# Patient Record
Sex: Female | Born: 1950 | Race: White | Hispanic: No | Smoking: Never smoker
Health system: Southern US, Community
[De-identification: ages and names within clinical notes are randomized; demographics above are authoritative.]

## PROBLEM LIST (undated history)

## (undated) DIAGNOSIS — I441 Atrioventricular block, second degree: Secondary | ICD-10-CM

## (undated) DIAGNOSIS — I2510 Atherosclerotic heart disease of native coronary artery without angina pectoris: Secondary | ICD-10-CM

## (undated) DIAGNOSIS — E039 Hypothyroidism, unspecified: Secondary | ICD-10-CM

## (undated) DIAGNOSIS — K219 Gastro-esophageal reflux disease without esophagitis: Secondary | ICD-10-CM

## (undated) DIAGNOSIS — E785 Hyperlipidemia, unspecified: Secondary | ICD-10-CM

## (undated) DIAGNOSIS — I5030 Unspecified diastolic (congestive) heart failure: Secondary | ICD-10-CM

## (undated) DIAGNOSIS — E669 Obesity, unspecified: Secondary | ICD-10-CM

## (undated) DIAGNOSIS — N183 Chronic kidney disease, stage 3 unspecified: Secondary | ICD-10-CM

## (undated) DIAGNOSIS — E559 Vitamin D deficiency, unspecified: Secondary | ICD-10-CM

## (undated) DIAGNOSIS — G8929 Other chronic pain: Secondary | ICD-10-CM

## (undated) DIAGNOSIS — J189 Pneumonia, unspecified organism: Secondary | ICD-10-CM

## (undated) DIAGNOSIS — J45909 Unspecified asthma, uncomplicated: Secondary | ICD-10-CM

## (undated) DIAGNOSIS — I639 Cerebral infarction, unspecified: Secondary | ICD-10-CM

## (undated) DIAGNOSIS — G43909 Migraine, unspecified, not intractable, without status migrainosus: Secondary | ICD-10-CM

## (undated) DIAGNOSIS — M545 Low back pain, unspecified: Secondary | ICD-10-CM

## (undated) DIAGNOSIS — M797 Fibromyalgia: Secondary | ICD-10-CM

## (undated) DIAGNOSIS — F419 Anxiety disorder, unspecified: Secondary | ICD-10-CM

## (undated) DIAGNOSIS — I214 Non-ST elevation (NSTEMI) myocardial infarction: Secondary | ICD-10-CM

## (undated) DIAGNOSIS — I10 Essential (primary) hypertension: Secondary | ICD-10-CM

## (undated) DIAGNOSIS — M1990 Unspecified osteoarthritis, unspecified site: Secondary | ICD-10-CM

## (undated) DIAGNOSIS — E119 Type 2 diabetes mellitus without complications: Secondary | ICD-10-CM

## (undated) DIAGNOSIS — J302 Other seasonal allergic rhinitis: Secondary | ICD-10-CM

## (undated) DIAGNOSIS — E1142 Type 2 diabetes mellitus with diabetic polyneuropathy: Secondary | ICD-10-CM

## (undated) HISTORY — DX: Obesity, unspecified: E66.9

## (undated) HISTORY — DX: Unspecified asthma, uncomplicated: J45.909

## (undated) HISTORY — DX: Hypothyroidism, unspecified: E03.9

## (undated) HISTORY — DX: Vitamin D deficiency, unspecified: E55.9

## (undated) HISTORY — PX: BREAST LUMPECTOMY: SHX2

## (undated) HISTORY — DX: Anxiety disorder, unspecified: F41.9

## (undated) HISTORY — PX: ANAL FISSURE REPAIR: SHX2312

## (undated) HISTORY — PX: BREAST BIOPSY: SHX20

## (undated) HISTORY — DX: Essential (primary) hypertension: I10

## (undated) HISTORY — DX: Hyperlipidemia, unspecified: E78.5

---

## 1995-09-18 HISTORY — PX: VAGINAL HYSTERECTOMY: SUR661

## 1999-06-08 ENCOUNTER — Ambulatory Visit (HOSPITAL_COMMUNITY): Admission: RE | Admit: 1999-06-08 | Discharge: 1999-06-08 | Payer: Self-pay | Admitting: Gastroenterology

## 2002-09-16 ENCOUNTER — Emergency Department (HOSPITAL_COMMUNITY): Admission: EM | Admit: 2002-09-16 | Discharge: 2002-09-16 | Payer: Self-pay | Admitting: Emergency Medicine

## 2002-10-18 ENCOUNTER — Encounter: Payer: Self-pay | Admitting: Emergency Medicine

## 2002-10-18 ENCOUNTER — Emergency Department (HOSPITAL_COMMUNITY): Admission: EM | Admit: 2002-10-18 | Discharge: 2002-10-18 | Payer: Self-pay | Admitting: Emergency Medicine

## 2002-10-26 ENCOUNTER — Inpatient Hospital Stay (HOSPITAL_COMMUNITY): Admission: EM | Admit: 2002-10-26 | Discharge: 2002-10-29 | Payer: Self-pay | Admitting: Emergency Medicine

## 2002-10-26 ENCOUNTER — Encounter: Payer: Self-pay | Admitting: Emergency Medicine

## 2002-11-02 ENCOUNTER — Encounter: Admission: RE | Admit: 2002-11-02 | Discharge: 2002-11-02 | Payer: Self-pay | Admitting: Family Medicine

## 2004-02-22 ENCOUNTER — Emergency Department (HOSPITAL_COMMUNITY): Admission: EM | Admit: 2004-02-22 | Discharge: 2004-02-22 | Payer: Self-pay | Admitting: Emergency Medicine

## 2004-08-26 ENCOUNTER — Ambulatory Visit (HOSPITAL_COMMUNITY): Admission: RE | Admit: 2004-08-26 | Discharge: 2004-08-26 | Payer: Self-pay | Admitting: Family Medicine

## 2004-09-05 ENCOUNTER — Ambulatory Visit (HOSPITAL_COMMUNITY): Admission: RE | Admit: 2004-09-05 | Discharge: 2004-09-05 | Payer: Self-pay | Admitting: Family Medicine

## 2004-09-15 ENCOUNTER — Ambulatory Visit (HOSPITAL_COMMUNITY): Admission: RE | Admit: 2004-09-15 | Discharge: 2004-09-15 | Payer: Self-pay | Admitting: Neurosurgery

## 2007-01-19 ENCOUNTER — Emergency Department (HOSPITAL_COMMUNITY): Admission: EM | Admit: 2007-01-19 | Discharge: 2007-01-19 | Payer: Self-pay | Admitting: Emergency Medicine

## 2012-11-27 ENCOUNTER — Other Ambulatory Visit: Payer: Self-pay | Admitting: *Deleted

## 2012-11-27 DIAGNOSIS — Z780 Asymptomatic menopausal state: Secondary | ICD-10-CM

## 2012-11-27 DIAGNOSIS — M545 Low back pain: Secondary | ICD-10-CM

## 2012-12-03 ENCOUNTER — Other Ambulatory Visit: Payer: Self-pay

## 2012-12-03 ENCOUNTER — Ambulatory Visit: Payer: Self-pay

## 2013-01-20 ENCOUNTER — Telehealth: Payer: Self-pay | Admitting: Family Medicine

## 2013-01-20 DIAGNOSIS — E139 Other specified diabetes mellitus without complications: Secondary | ICD-10-CM

## 2013-01-21 MED ORDER — INSULIN GLARGINE 100 UNIT/ML SOLOSTAR PEN: 55.0000 [IU] | PEN_INJECTOR | Freq: Two times a day (BID) | SUBCUTANEOUS | Status: DC

## 2013-01-21 NOTE — Addendum Note (Signed)
Addended by: Cherre Robins on: 01/21/2013 12:36 PM   Modules accepted: Orders

## 2013-01-21 NOTE — Telephone Encounter (Signed)
PLEASE REVIEW. THANKS

## 2013-01-21 NOTE — Telephone Encounter (Addendum)
Spoke with patient - she has increased Lantus on own to 50 units bid instead of 45units qam and 40 units qpm.  Also using Humalog SS Less than 80 - no insulin 80-120 - 30 units 121-150 - 32 units 151 - 180 - 34 units 181-210 - 36 units 211-240 - 38 units 241-270 - 40 units 271-300 - 42 units 301 or above - 44 units  FBG readings still remain in the 190's.  Patient had a BG reading of 92 yesterday and she felt like it was too low.  Rested and ate something and felt better.  Patient was instructed to increase Lantus to 52 units BID for 2 days, if FBG still greater than 120 in am, then increase to 54 units BID.    Might need to adjust Humalog SS in future - patient was instructed to call if she has BG less than 70.    Updated Rx for Lantus called to [facility].

## 2013-01-21 NOTE — Telephone Encounter (Signed)
Please look into this, last visit says 45u am and 40u pm, let me know, because it needs to be sent to [facility]

## 2013-01-21 NOTE — Telephone Encounter (Signed)
SENT TO [facility] FOR REVIEW

## 2013-01-26 ENCOUNTER — Telehealth: Payer: Self-pay | Admitting: Pharmacist Clinician (PhC)/ Clinical Pharmacy Specialist

## 2013-01-27 NOTE — Telephone Encounter (Signed)
Called in Lantus needles to [facility] for patient.

## 2013-02-02 ENCOUNTER — Telehealth: Payer: Self-pay | Admitting: Family Medicine

## 2013-02-02 ENCOUNTER — Other Ambulatory Visit: Payer: Self-pay

## 2013-02-02 NOTE — Telephone Encounter (Signed)
.  .  .        xx

## 2013-02-02 NOTE — Telephone Encounter (Signed)
SYRINGES REFILLED, MESSAGES WERE CONFUSING, I THOUGHT IT HAD BEEN DONE

## 2013-02-19 ENCOUNTER — Encounter: Payer: Self-pay | Admitting: *Deleted

## 2013-02-23 ENCOUNTER — Other Ambulatory Visit (INDEPENDENT_AMBULATORY_CARE_PROVIDER_SITE_OTHER): Payer: Medicare PPO

## 2013-02-23 DIAGNOSIS — E559 Vitamin D deficiency, unspecified: Secondary | ICD-10-CM

## 2013-02-23 DIAGNOSIS — I10 Essential (primary) hypertension: Secondary | ICD-10-CM

## 2013-02-23 DIAGNOSIS — E785 Hyperlipidemia, unspecified: Secondary | ICD-10-CM

## 2013-02-23 LAB — COMPLETE METABOLIC PANEL WITH GFR
ALT: 28 U/L (ref 0–35)
AST: 36 U/L (ref 0–37)
Albumin: 4.1 g/dL (ref 3.5–5.2)
Alkaline Phosphatase: 74 U/L (ref 39–117)
BUN: 27 mg/dL — ABNORMAL HIGH (ref 6–23)
CO2: 30 mEq/L (ref 19–32)
Calcium: 10.4 mg/dL (ref 8.4–10.5)
Chloride: 102 mEq/L (ref 96–112)
Creat: 0.83 mg/dL (ref 0.50–1.10)
GFR, Est African American: 88 mL/min
GFR, Est Non African American: 76 mL/min
Glucose, Bld: 60 mg/dL — ABNORMAL LOW (ref 70–99)
Potassium: 5 mEq/L (ref 3.5–5.3)
Sodium: 141 mEq/L (ref 135–145)
Total Bilirubin: 0.4 mg/dL (ref 0.3–1.2)
Total Protein: 7.1 g/dL (ref 6.0–8.3)

## 2013-02-23 NOTE — Progress Notes (Signed)
Patient came in for labs only.

## 2013-02-24 LAB — NMR LIPOPROFILE WITH LIPIDS
Cholesterol, Total: 248 mg/dL — ABNORMAL HIGH (ref <200)
HDL Particle Number: 39.1 umol/L (ref >=30.5)
HDL Size: 8.3 nm — ABNORMAL LOW (ref >=9.2)
HDL-C: 45 mg/dL (ref >=40)
LDL (calc): 131 mg/dL — ABNORMAL HIGH (ref <100)
LDL Particle Number: 2670 nmol/L — ABNORMAL HIGH (ref <1000)
LDL Size: 20.1 nm — ABNORMAL LOW (ref >20.5)
LP-IR Score: 92 — ABNORMAL HIGH (ref <=45)
Large HDL-P: 1.3 umol/L — ABNORMAL LOW (ref >=4.8)
Large VLDL-P: 20.5 nmol/L — ABNORMAL HIGH (ref <=2.7)
Small LDL Particle Number: 1857 nmol/L — ABNORMAL HIGH (ref <=527)
Triglycerides: 359 mg/dL — ABNORMAL HIGH (ref <150)
VLDL Size: 59.4 nm — ABNORMAL HIGH (ref <=46.6)

## 2013-02-24 LAB — VITAMIN D 25 HYDROXY (VIT D DEFICIENCY, FRACTURES): Vit D, 25-Hydroxy: 30 ng/mL (ref 30–89)

## 2013-02-24 NOTE — Progress Notes (Signed)
Quick Note:  Labs abnormal.lipids too high. Needs to see the pharmacist to review and Adjust medications. ______

## 2013-02-25 ENCOUNTER — Ambulatory Visit (INDEPENDENT_AMBULATORY_CARE_PROVIDER_SITE_OTHER): Payer: Medicare PPO | Admitting: Family Medicine

## 2013-02-25 ENCOUNTER — Encounter: Payer: Self-pay | Admitting: Family Medicine

## 2013-02-25 ENCOUNTER — Telehealth: Payer: Self-pay | Admitting: Family Medicine

## 2013-02-25 VITALS — BP 142/64 | HR 95 | Temp 97.8°F | Wt 273.4 lb

## 2013-02-25 DIAGNOSIS — I10 Essential (primary) hypertension: Secondary | ICD-10-CM

## 2013-02-25 DIAGNOSIS — E119 Type 2 diabetes mellitus without complications: Secondary | ICD-10-CM

## 2013-02-25 DIAGNOSIS — H60392 Other infective otitis externa, left ear: Secondary | ICD-10-CM

## 2013-02-25 DIAGNOSIS — H9202 Otalgia, left ear: Secondary | ICD-10-CM | POA: Insufficient documentation

## 2013-02-25 DIAGNOSIS — F411 Generalized anxiety disorder: Secondary | ICD-10-CM

## 2013-02-25 DIAGNOSIS — E039 Hypothyroidism, unspecified: Secondary | ICD-10-CM | POA: Insufficient documentation

## 2013-02-25 DIAGNOSIS — E669 Obesity, unspecified: Secondary | ICD-10-CM | POA: Insufficient documentation

## 2013-02-25 DIAGNOSIS — H60399 Other infective otitis externa, unspecified ear: Secondary | ICD-10-CM | POA: Insufficient documentation

## 2013-02-25 DIAGNOSIS — H9209 Otalgia, unspecified ear: Secondary | ICD-10-CM

## 2013-02-25 LAB — POCT GLYCOSYLATED HEMOGLOBIN (HGB A1C): Hemoglobin A1C: 7.7

## 2013-02-25 MED ORDER — CITALOPRAM HYDROBROMIDE 20 MG PO TABS: 20.0000 mg | ORAL_TABLET | Freq: Every day | ORAL | Status: DC

## 2013-02-25 MED ORDER — SULFAMETHOXAZOLE-TRIMETHOPRIM 800-160 MG PO TABS: 1.0000 | ORAL_TABLET | Freq: Two times a day (BID) | ORAL | Status: DC

## 2013-02-25 NOTE — Progress Notes (Signed)
Patient ID: Mary Raymond, female   DOB: 12-Apr-1951, 62 y.o.   MRN: [id number] SUBJECTIVE: CC: Chief Complaint  Patient presents with  . Follow-up    diabetes  . Medication Refill    one touch test strips  and alprazojlam    HPI: Patient is here for follow up of Diabetes Mellitus/Obesity/hypertension: Symptoms of DM: Denies Nocturia ,Denies Urinary Frequency , denies Blurred vision ,deniesDizziness,denies.Dysuria,denies paresthesias, denies extremity pain or ulcers.Marland Kitchendenies chest pain. has had an annual eye exam. do check the feet. Does check CBGs. Average CBG:180 this am, some 120s, at night it is high Denies episodes of hypoglycemia. Does have an emergency hypoglycemic plan. admits toCompliance with medications. Denies Problems with medications.  Thinks she may have a sinus infection. Back of head hurts.  Working on her diet:  Breakfast: oatmeal and a boiled egg, some walnuts.1/2 a banana Lunch: yohghurt, ham sandwich Dinner: is the problem eats pasta etc Does cheat on her diet  exercise  PMH/PSH: reviewed/updated in Epic  SH/FH: reviewed/updated in Epic  Allergies: reviewed/updated in Epic  Medications: reviewed/updated in Epic  Immunizations: reviewed/updated in Epic  ROS: As above in the HPI. All other systems are stable or negative.  OBJECTIVE: APPEARANCE:  Patient in no acute distress.The patient appeared well nourished and normally developed. Acyanotic. Waist: VITAL SIGNS:BP 142/64  Pulse 95  Temp(Src) 97.8 F (36.6 C) (Oral)  Wt 273 lb 6.4 oz (124.013 kg) Morbidly obese WF  SKIN: warm and  Dry without overt rashes, tattoos and scars  HEAD and Neck: without JVD, Head and scalp: normal Eyes:No scleral icterus. Fundi normal, eye movements normal. Ears: Auricle normal, left external canal red and  Swollen and the left TM is hyperemic the right canal normal,right Tympanic membrane normal, insufflation normal. Nose: normal Throat: normal Neck &  thyroid: normal  CHEST & LUNGS: Chest wall: normal Lungs: Clear  CVS: Reveals the PMI to be normally located. Regular rhythm, First and Second Heart sounds are normal,  absence of murmurs, rubs or gallops. Peripheral vasculature: Radial pulses: normal Dorsal pedis pulses: normal Posterior pulses: normal  ABDOMEN:  Appearance: normal Benign, no organomegaly, no masses, no Abdominal Aortic enlargement. No Guarding , no rebound. No Bruits. Bowel sounds: normal  RECTAL: N/A GU: N/A  EXTREMETIES: nonedematous. Both Femoral and Pedal pulses are normal.  MUSCULOSKELETAL:  Spine: normal Joints: intact  NEUROLOGIC: oriented to time,place and person; nonfocal. Strength is normal Sensory is normal Reflexes are normal Cranial Nerves are normal. Results for orders placed in visit on 02/25/13  POCT GLYCOSYLATED HEMOGLOBIN (HGB A1C)      Result Value Range   Hemoglobin A1C 7.7 %      ASSESSMENT: Anxiety state, unspecified - Plan: citalopram (CELEXA) 20 MG tablet  DM (diabetes mellitus) - Plan: POCT glycosylated hemoglobin (Hb A1C)  Otitis, externa, infective, left - Plan: sulfamethoxazole-trimethoprim (BACTRIM DS,SEPTRA DS) 800-160 MG per tablet  Earache on left - Plan: sulfamethoxazole-trimethoprim (BACTRIM DS,SEPTRA DS) 800-160 MG per tablet  Obesity, unspecified  HTN (hypertension)  Unspecified hypothyroidism    PLAN: Hand written Rx for one touch ultra test strips 5x a day #450 refill x 1 year Orders Placed This Encounter  Procedures  . POCT glycosylated hemoglobin (Hb A1C)   Meds ordered this encounter  Medications  . acyclovir (ZOVIRAX) 400 MG tablet    Sig: Take 400 mg by mouth once.   Marland Kitchen allopurinol (ZYLOPRIM) 300 MG tablet    Sig: Take 300 mg by mouth.   . ALPRAZolam (XANAX) 0.5  MG tablet    Sig: Take 0.25 mg by mouth 3 (three) times daily as needed.   Marland Kitchen amLODipine (NORVASC) 10 MG tablet    Sig: Take 10 mg by mouth daily.   . Blood Glucose  Monitoring Suppl (ONE TOUCH ULTRA 2) W/DEVICE KIT    Sig: Check blood sugar 5x a day  . BYDUREON 2 MG SUSR    Sig: Inject 2 mg as directed once a week.   Marland Kitchen ZETIA 10 MG tablet    Sig: Take 10 mg by mouth daily.   . fenofibrate micronized (LOFIBRA) 134 MG capsule    Sig: Take 134 mg by mouth daily before breakfast.   . fluticasone (FLONASE) 50 MCG/ACT nasal spray    Sig: Place 2 sprays into the nose daily.   . furosemide (LASIX) 20 MG tablet    Sig: Take 20 mg by mouth daily.   Marland Kitchen gabapentin (NEURONTIN) 300 MG capsule    Sig: Take 300 mg by mouth 3 (three) times daily.   Marland Kitchen HUMALOG 100 UNIT/ML injection    Sig: Per sliding scale  Tid  . B-D UF III MINI PEN NEEDLES 31G X 5 MM MISC    Sig: Three times a day  . B-D INS SYRINGE 0.5CC/31GX5/16 31G X 5/16" 0.5 ML MISC    Sig: Inject 1 Syringe into the skin 3 (three) times daily.   . TRUEPLUS LANCETS 28G MISC    Sig: Test 5x day  . levothyroxine (SYNTHROID, LEVOTHROID) 50 MCG tablet    Sig: Take 50 mcg by mouth daily before breakfast.   . losartan (COZAAR) 50 MG tablet    Sig: Take 50 mg by mouth daily.   . montelukast (SINGULAIR) 10 MG tablet    Sig: Take 10 mg by mouth at bedtime.   Marland Kitchen omeprazole (PRILOSEC) 40 MG capsule    Sig: Take 40 mg by mouth daily.   . meclizine (ANTIVERT) 25 MG tablet    Sig: Take 25 mg by mouth 3 (three) times daily as needed.  . citalopram (CELEXA) 20 MG tablet    Sig: Take 1 tablet (20 mg total) by mouth daily.    Dispense:  30 tablet    Refill:  3  . sulfamethoxazole-trimethoprim (BACTRIM DS,SEPTRA DS) 800-160 MG per tablet    Sig: Take 1 tablet by mouth 2 (two) times daily.    Dispense:  20 tablet    Refill:  0        Dr Paula Libra Recommendations  Diet and Exercise discussed with patient.  For nutrition information, I recommend books:  1).Eat to Live by Dr Excell Seltzer. 2).Prevent and Reverse Heart Disease by Dr Karl Luke. 3) Dr Janene Harvey Book:  Program to Reverse  Diabetes  Exercise recommendations are:  If unable to walk, then the patient can exercise in a chair 3 times a day. By flapping arms like a bird gently and raising legs outwards to the front.  If ambulatory, the patient can go for walks for 30 minutes 3 times a week. Then increase the intensity and duration as tolerated.  Goal is to try to attain exercise frequency to 5 times a week.  If applicable: Best to perform resistance exercises (machines or weights) 2 days a week and cardio type exercises 3 days per week.  Return in about 3 months (around 05/28/2013) for Recheck medical problems.  Emmalise Huard P. Jacelyn Grip, M.D.

## 2013-02-25 NOTE — Patient Instructions (Addendum)
      Dr Paula Libra Recommendations  Diet and Exercise discussed with patient.  For nutrition information, I recommend books:  1).Eat to Live by Dr Excell Seltzer. 2).Prevent and Reverse Heart Disease by Dr Karl Luke. 3) Dr Janene Harvey Book:  Program to Reverse Diabetes  Exercise recommendations are:  If unable to walk, then the patient can exercise in a chair 3 times a day. By flapping arms like a bird gently and raising legs outwards to the front.  If ambulatory, the patient can go for walks for 30 minutes 3 times a week. Then increase the intensity and duration as tolerated.  Goal is to try to attain exercise frequency to 5 times a week.  If applicable: Best to perform resistance exercises (machines or weights) 2 days a week and cardio type exercises 3 days per week.

## 2013-03-02 ENCOUNTER — Ambulatory Visit: Payer: Self-pay

## 2013-03-09 ENCOUNTER — Telehealth: Payer: Self-pay | Admitting: Family Medicine

## 2013-03-10 NOTE — Telephone Encounter (Signed)
Clarified.

## 2013-03-16 ENCOUNTER — Ambulatory Visit: Payer: Self-pay

## 2013-03-26 ENCOUNTER — Ambulatory Visit: Payer: Self-pay

## 2013-04-24 ENCOUNTER — Other Ambulatory Visit: Payer: Self-pay | Admitting: Family Medicine

## 2013-04-29 ENCOUNTER — Other Ambulatory Visit: Payer: Self-pay | Admitting: Family Medicine

## 2013-05-06 ENCOUNTER — Telehealth: Payer: Self-pay | Admitting: Family Medicine

## 2013-05-14 ENCOUNTER — Other Ambulatory Visit: Payer: Self-pay | Admitting: Family Medicine

## 2013-05-28 ENCOUNTER — Ambulatory Visit (INDEPENDENT_AMBULATORY_CARE_PROVIDER_SITE_OTHER): Payer: Medicare PPO | Admitting: Family Medicine

## 2013-05-28 ENCOUNTER — Other Ambulatory Visit: Payer: Self-pay | Admitting: Family Medicine

## 2013-05-28 ENCOUNTER — Encounter: Payer: Self-pay | Admitting: Family Medicine

## 2013-05-28 VITALS — BP 151/70 | HR 79 | Temp 97.0°F | Ht 65.0 in | Wt 269.2 lb

## 2013-05-28 DIAGNOSIS — J45909 Unspecified asthma, uncomplicated: Secondary | ICD-10-CM | POA: Insufficient documentation

## 2013-05-28 DIAGNOSIS — E1159 Type 2 diabetes mellitus with other circulatory complications: Secondary | ICD-10-CM | POA: Insufficient documentation

## 2013-05-28 DIAGNOSIS — H9209 Otalgia, unspecified ear: Secondary | ICD-10-CM

## 2013-05-28 DIAGNOSIS — E559 Vitamin D deficiency, unspecified: Secondary | ICD-10-CM | POA: Insufficient documentation

## 2013-05-28 DIAGNOSIS — E039 Hypothyroidism, unspecified: Secondary | ICD-10-CM | POA: Insufficient documentation

## 2013-05-28 DIAGNOSIS — T7840XA Allergy, unspecified, initial encounter: Secondary | ICD-10-CM | POA: Insufficient documentation

## 2013-05-28 DIAGNOSIS — F419 Anxiety disorder, unspecified: Secondary | ICD-10-CM | POA: Insufficient documentation

## 2013-05-28 DIAGNOSIS — F411 Generalized anxiety disorder: Secondary | ICD-10-CM

## 2013-05-28 DIAGNOSIS — IMO0001 Reserved for inherently not codable concepts without codable children: Secondary | ICD-10-CM | POA: Insufficient documentation

## 2013-05-28 DIAGNOSIS — E785 Hyperlipidemia, unspecified: Secondary | ICD-10-CM

## 2013-05-28 DIAGNOSIS — H9202 Otalgia, left ear: Secondary | ICD-10-CM

## 2013-05-28 DIAGNOSIS — G629 Polyneuropathy, unspecified: Secondary | ICD-10-CM | POA: Insufficient documentation

## 2013-05-28 DIAGNOSIS — E669 Obesity, unspecified: Secondary | ICD-10-CM

## 2013-05-28 DIAGNOSIS — M109 Gout, unspecified: Secondary | ICD-10-CM

## 2013-05-28 DIAGNOSIS — H60399 Other infective otitis externa, unspecified ear: Secondary | ICD-10-CM

## 2013-05-28 DIAGNOSIS — E1169 Type 2 diabetes mellitus with other specified complication: Secondary | ICD-10-CM | POA: Insufficient documentation

## 2013-05-28 DIAGNOSIS — I152 Hypertension secondary to endocrine disorders: Secondary | ICD-10-CM | POA: Insufficient documentation

## 2013-05-28 DIAGNOSIS — E119 Type 2 diabetes mellitus without complications: Secondary | ICD-10-CM

## 2013-05-28 DIAGNOSIS — I10 Essential (primary) hypertension: Secondary | ICD-10-CM

## 2013-05-28 DIAGNOSIS — H60392 Other infective otitis externa, left ear: Secondary | ICD-10-CM

## 2013-05-28 LAB — POCT GLYCOSYLATED HEMOGLOBIN (HGB A1C): Hemoglobin A1C: 6.9

## 2013-05-28 MED ORDER — ACYCLOVIR 400 MG PO TABS: 400.0000 mg | ORAL_TABLET | Freq: Once | ORAL | Status: DC

## 2013-05-28 MED ORDER — CITALOPRAM HYDROBROMIDE 40 MG PO TABS: 40.0000 mg | ORAL_TABLET | Freq: Every day | ORAL | Status: DC

## 2013-05-28 MED ORDER — LOSARTAN POTASSIUM 100 MG PO TABS: 100.0000 mg | ORAL_TABLET | Freq: Every day | ORAL | Status: DC

## 2013-05-28 MED ORDER — SULFAMETHOXAZOLE-TRIMETHOPRIM 800-160 MG PO TABS: 1.0000 | ORAL_TABLET | Freq: Two times a day (BID) | ORAL | Status: DC

## 2013-05-28 MED ORDER — AMLODIPINE BESYLATE 10 MG PO TABS: 10.0000 mg | ORAL_TABLET | Freq: Every day | ORAL | Status: DC

## 2013-05-28 NOTE — Progress Notes (Signed)
Patient ID: Mary Raymond, female   DOB: April 01, 1951, 62 y.o.   MRN: [id number] SUBJECTIVE: CC: Chief Complaint  Patient presents with  . Follow-up    3 month follow uip  refill acyclovir and amlodipine .  c/o sinus infection. would llike to hav e xanax      HPI:  Patient is here for follow up of Diabetes Mellitus/HTN/Hypothyroidism: Symptoms evaluated: Denies Nocturia ,Denies Urinary Frequency , denies Blurred vision ,deniesDizziness,denies.Dysuria,denies paresthesias, denies extremity pain or ulcers.Marland Kitchendenies chest pain. has had an annual eye exam. do check the feet. Does check CBGs. Average CBG: Denies episodes of hypoglycemia. Does have an emergency hypoglycemic plan. admits toCompliance with medications. Denies Problems with medications.  Anxiety : nervous wreck, stress, problems with children son is addicted to xanax and pain pills. neightbor found dead. Daughter is giving problems as well.  Past Medical History  Diagnosis Date  . Diabetes mellitus without complication   . Hypertension   . Hyperlipidemia    No past surgical history on file. History   Social History  . Marital Status: Divorced    Spouse Name: N/A    Number of Children: N/A  . Years of Education: N/A   Occupational History  . Not on file.   Social History Main Topics  . Smoking status: Never Smoker   . Smokeless tobacco: Not on file  . Alcohol Use: Not on file  . Drug Use: Not on file  . Sexual Activity: Not on file   Other Topics Concern  . Not on file   Social History Narrative  . No narrative on file   No family history on file. Current Outpatient Prescriptions on File Prior to Visit  Medication Sig Dispense Refill  . acyclovir (ZOVIRAX) 400 MG tablet Take 400 mg by mouth once.       Marland Kitchen allopurinol (ZYLOPRIM) 300 MG tablet Take 300 mg by mouth.       Marland Kitchen amLODipine (NORVASC) 10 MG tablet Take 10 mg by mouth daily.       . B-D INS SYRINGE 0.5CC/31GX5/16 31G X 5/16" 0.5 ML MISC USE 5  TIMES DAILY AS INSTRUCTED  100 each  1  . B-D UF III MINI PEN NEEDLES 31G X 5 MM MISC Three times a day      . Blood Glucose Monitoring Suppl (ONE TOUCH ULTRA 2) W/DEVICE KIT Check blood sugar 5x a day      . BYDUREON 2 MG SUSR INJECT 2MG  UNDER THE SKIN ONCE A WEEK AS INSTRUCTED  4 each  2  . citalopram (CELEXA) 20 MG tablet Take 1 tablet (20 mg total) by mouth daily.  30 tablet  3  . fenofibrate micronized (LOFIBRA) 134 MG capsule TAKE ONE CAPSULE BY MOUTH ONE TIME DAILY  90 capsule  1  . fluticasone (FLONASE) 50 MCG/ACT nasal spray Place 2 sprays into the nose daily.       . furosemide (LASIX) 20 MG tablet Take 20 mg by mouth daily.       Marland Kitchen gabapentin (NEURONTIN) 300 MG capsule Take 300 mg by mouth 3 (three) times daily.       Marland Kitchen HUMALOG 100 UNIT/ML injection INJECT UP TO 45 UNITS UNDER THE SKIN THREE TIMES A DAY PRIOR TO MEALSAS INSTRUCTED  10 mL  0  . Insulin Glargine (LANTUS SOLOSTAR) 100 UNIT/ML SOPN Inject 55 Units into the skin 2 (two) times daily. Inject up to 55 units twice a day as directed.  45 mL  2  .  levothyroxine (SYNTHROID, LEVOTHROID) 50 MCG tablet Take 50 mcg by mouth daily before breakfast.       . losartan (COZAAR) 50 MG tablet Take 50 mg by mouth daily.       . meclizine (ANTIVERT) 25 MG tablet Take 25 mg by mouth 3 (three) times daily as needed.      . montelukast (SINGULAIR) 10 MG tablet Take 10 mg by mouth at bedtime.       Marland Kitchen omeprazole (PRILOSEC) 40 MG capsule Take 40 mg by mouth daily.       . TRUEPLUS LANCETS 28G MISC Test 5x day      . ZETIA 10 MG tablet Take 10 mg by mouth daily.        No current facility-administered medications on file prior to visit.   No Known Allergies  There is no immunization history on file for this patient. Prior to Admission medications   Medication Sig Start Date End Date Taking? Authorizing Provider  acyclovir (ZOVIRAX) 400 MG tablet Take 400 mg by mouth once.  01/10/13  Yes Historical Provider, MD  allopurinol (ZYLOPRIM) 300 MG  tablet Take 300 mg by mouth.  02/13/13  Yes Historical Provider, MD  amLODipine (NORVASC) 10 MG tablet Take 10 mg by mouth daily.  02/13/13  Yes Historical Provider, MD  B-D INS SYRINGE 0.5CC/31GX5/16 31G X 5/16" 0.5 ML MISC USE 5 TIMES DAILY AS INSTRUCTED 04/29/13  Yes Vernie Shanks, MD  B-D UF III MINI PEN NEEDLES 31G X 5 MM MISC Three times a day 01/26/13  Yes Historical Provider, MD  Blood Glucose Monitoring Suppl (ONE TOUCH ULTRA 2) W/DEVICE KIT Check blood sugar 5x a day 12/09/12  Yes Historical Provider, MD  BYDUREON 2 MG SUSR INJECT 2MG  UNDER THE SKIN ONCE A WEEK AS INSTRUCTED 05/14/13  Yes Vernie Shanks, MD  citalopram (CELEXA) 20 MG tablet Take 1 tablet (20 mg total) by mouth daily. 02/25/13  Yes Vernie Shanks, MD  fenofibrate micronized (LOFIBRA) 134 MG capsule TAKE ONE CAPSULE BY MOUTH ONE TIME DAILY 05/14/13  Yes Vernie Shanks, MD  fluticasone [facility]) 50 MCG/ACT nasal spray Place 2 sprays into the nose daily.  12/05/12  Yes Historical Provider, MD  furosemide (LASIX) 20 MG tablet Take 20 mg by mouth daily.  02/13/13  Yes Historical Provider, MD  gabapentin (NEURONTIN) 300 MG capsule Take 300 mg by mouth 3 (three) times daily.  11/24/12  Yes Historical Provider, MD  HUMALOG 100 UNIT/ML injection INJECT UP TO 45 UNITS UNDER THE SKIN THREE TIMES A DAY PRIOR TO MEALSAS INSTRUCTED 04/24/13  Yes Vernie Shanks, MD  Insulin Glargine (LANTUS SOLOSTAR) 100 UNIT/ML SOPN Inject 55 Units into the skin 2 (two) times daily. Inject up to 55 units twice a day as directed. 01/21/13  Yes Vernie Shanks, MD  levothyroxine (SYNTHROID, LEVOTHROID) 50 MCG tablet Take 50 mcg by mouth daily before breakfast.  11/24/12  Yes Historical Provider, MD  losartan (COZAAR) 50 MG tablet Take 50 mg by mouth daily.  02/13/13  Yes Historical Provider, MD  meclizine (ANTIVERT) 25 MG tablet Take 25 mg by mouth 3 (three) times daily as needed.   Yes Historical Provider, MD  montelukast (SINGULAIR) 10 MG tablet Take 10 mg by mouth at  bedtime.  02/13/13  Yes Historical Provider, MD  omeprazole (PRILOSEC) 40 MG capsule Take 40 mg by mouth daily.  02/13/13  Yes Historical Provider, MD  TRUEPLUS LANCETS 28G MISC Test 5x day 12/30/12  Yes  Historical Provider, MD  ZETIA 10 MG tablet Take 10 mg by mouth daily.  02/13/13  Yes Historical Provider, MD    ROS: As above in the HPI. All other systems are stable or negative.  OBJECTIVE: APPEARANCE:  Patient in no acute distress.The patient appeared well nourished and normally developed. Acyanotic. Waist: VITAL SIGNS:BP 151/70  Pulse 79  Temp(Src) 97 F (36.1 C) (Oral)  Ht 5\' 5"  (1.651 m)  Wt 269 lb 3.2 oz (122.108 kg)  BMI 44.8 kg/m2  Obese WF Morbidly obese  SKIN: warm and  Dry without overt rashes, tattoos and scars  HEAD and Neck: without JVD, Head and scalp: normal Eyes:No scleral icterus. Fundi normal, eye movements normal. Ears: Auricle normal, canal red bilaterally right worse than the left, Tympanic membranes normal, insufflation normal. Nose: normal Throat: normal Neck & thyroid: normal  CHEST & LUNGS: Chest wall: normal Lungs: Clear  CVS: Reveals the PMI to be normally located. Regular rhythm, First and Second Heart sounds are normal,  absence of murmurs, rubs or gallops. Peripheral vasculature: Radial pulses: normal Dorsal pedis pulses: normal Posterior pulses: normal  ABDOMEN:  Appearance: morbidly Obese Benign, no organomegaly, no masses, no Abdominal Aortic enlargement. No Guarding , no rebound. No Bruits. Bowel sounds: normal  RECTAL: N/A GU: N/A  EXTREMETIES: nonedematous.  MUSCULOSKELETAL:  Spine: normal Joints: intact  NEUROLOGIC: oriented to time,place and person; nonfocal.  ASSESSMENT: Anxiety state, unspecified - Plan: citalopram (CELEXA) 40 MG tablet  Diabetes mellitus without complication - Plan: POCT glycosylated hemoglobin (Hb A1C), CMP14+EGFR  Earache on left - Plan: sulfamethoxazole-trimethoprim (BACTRIM DS,SEPTRA DS)  800-160 MG per tablet  Gout  HTN (hypertension) - Plan: amLODipine (NORVASC) 10 MG tablet, losartan (COZAAR) 100 MG tablet, CMP14+EGFR  Hyperlipidemia - Plan: CMP14+EGFR, NMR, lipoprofile  Hypothyroid - Plan: TSH  Obesity, unspecified  Obesity  Vitamin D deficiency  Otitis, externa, infective, left - Plan: sulfamethoxazole-trimethoprim (BACTRIM DS,SEPTRA DS) 800-160 MG per tablet   PLAN:  Orders Placed This Encounter  Procedures  . CMP14+EGFR  . NMR, lipoprofile  . TSH  . POCT glycosylated hemoglobin (Hb A1C)    Meds ordered this encounter  Medications  . sulfamethoxazole-trimethoprim (BACTRIM DS,SEPTRA DS) 800-160 MG per tablet    Sig: Take 1 tablet by mouth 2 (two) times daily.    Dispense:  20 tablet    Refill:  0  . acyclovir (ZOVIRAX) 400 MG tablet    Sig: Take 1 tablet (400 mg total) by mouth once.    Dispense:  30 tablet    Refill:  5  . citalopram (CELEXA) 40 MG tablet    Sig: Take 1 tablet (40 mg total) by mouth daily.    Dispense:  30 tablet    Refill:  5  . amLODipine (NORVASC) 10 MG tablet    Sig: Take 1 tablet (10 mg total) by mouth daily.    Dispense:  30 tablet    Refill:  5  . losartan (COZAAR) 100 MG tablet    Sig: Take 1 tablet (100 mg total) by mouth daily.    Dispense:  30 tablet    Refill:  5    Stress reduction      Dr Paula Libra Recommendations  For nutrition information, I recommend books:  1).Eat to Live by Dr Excell Seltzer. 2).Prevent and Reverse Heart Disease by Dr Karl Luke. 3) Dr Janene Harvey Book:  Program to Reverse Diabetes  Exercise recommendations are:  If unable to walk, then the patient can exercise in  a chair 3 times a day. By flapping arms like a bird gently and raising legs outwards to the front.  If ambulatory, the patient can go for walks for 30 minutes 3 times a week. Then increase the intensity and duration as tolerated.  Goal is to try to attain exercise frequency to 5 times a week.  If  applicable: Best to perform resistance exercises (machines or weights) 2 days a week and cardio type exercises 3 days per week. Medications Discontinued During This Encounter  Medication Reason  . ALPRAZolam (XANAX) 0.5 MG tablet Completed Course  . sulfamethoxazole-trimethoprim (BACTRIM DS,SEPTRA DS) 800-160 MG per tablet Completed Course  . acyclovir (ZOVIRAX) 400 MG tablet Reorder  . citalopram (CELEXA) 20 MG tablet Reorder  . amLODipine (NORVASC) 10 MG tablet Reorder  . losartan (COZAAR) 50 MG tablet Reorder   Diet exercise , weight reduction. Supportive therapy. counselling recommended. Return in about 6 weeks (around 07/09/2013) for Recheck medical problems.  Maejor Erven P. Jacelyn Grip, M.D.

## 2013-05-28 NOTE — Patient Instructions (Addendum)
      Dr Camron Monday's Recommendations  For nutrition information, I recommend books:  1).Eat to Live by Dr Joel Fuhrman. 2).Prevent and Reverse Heart Disease by Dr Caldwell Esselstyn. 3) Dr Neal Barnard's Book:  Program to Reverse Diabetes  Exercise recommendations are:  If unable to walk, then the patient can exercise in a chair 3 times a day. By flapping arms like a bird gently and raising legs outwards to the front.  If ambulatory, the patient can go for walks for 30 minutes 3 times a week. Then increase the intensity and duration as tolerated.  Goal is to try to attain exercise frequency to 5 times a week.  If applicable: Best to perform resistance exercises (machines or weights) 2 days a week and cardio type exercises 3 days per week.  

## 2013-05-30 LAB — CMP14+EGFR
ALT: 33 IU/L — ABNORMAL HIGH (ref 0–32)
AST: 42 IU/L — ABNORMAL HIGH (ref 0–40)
Albumin/Globulin Ratio: 2 (ref 1.1–2.5)
Albumin: 4.5 g/dL (ref 3.6–4.8)
Alkaline Phosphatase: 75 IU/L (ref 39–117)
BUN/Creatinine Ratio: 33 — ABNORMAL HIGH (ref 11–26)
BUN: 24 mg/dL (ref 8–27)
CO2: 28 mmol/L (ref 18–29)
Calcium: 10.4 mg/dL — ABNORMAL HIGH (ref 8.6–10.2)
Chloride: 100 mmol/L (ref 97–108)
Creatinine, Ser: 0.73 mg/dL (ref 0.57–1.00)
GFR calc Af Amer: 103 mL/min/{1.73_m2} (ref >59)
GFR calc non Af Amer: 89 mL/min/{1.73_m2} (ref >59)
Globulin, Total: 2.3 g/dL (ref 1.5–4.5)
Glucose: 66 mg/dL (ref 65–99)
Potassium: 4.9 mmol/L (ref 3.5–5.2)
Sodium: 141 mmol/L (ref 134–144)
Total Bilirubin: 0.2 mg/dL (ref 0.0–1.2)
Total Protein: 6.8 g/dL (ref 6.0–8.5)

## 2013-05-30 LAB — NMR, LIPOPROFILE
Cholesterol: 219 mg/dL — ABNORMAL HIGH (ref <200)
HDL Cholesterol by NMR: 47 mg/dL (ref >=40)
HDL Particle Number: 37.9 umol/L (ref >=30.5)
LDL Particle Number: 2257 nmol/L — ABNORMAL HIGH (ref <1000)
LDL Size: 20.8 nm (ref >20.5)
LDLC SERPL CALC-MCNC: 115 mg/dL — ABNORMAL HIGH (ref <100)
LP-IR Score: 80 — ABNORMAL HIGH (ref <=45)
Small LDL Particle Number: 1145 nmol/L — ABNORMAL HIGH (ref <=527)
Triglycerides by NMR: 287 mg/dL — ABNORMAL HIGH (ref <150)

## 2013-05-30 LAB — TSH: TSH: 2.5 u[IU]/mL (ref 0.450–4.500)

## 2013-07-07 ENCOUNTER — Other Ambulatory Visit: Payer: Self-pay | Admitting: Family Medicine

## 2013-07-16 ENCOUNTER — Ambulatory Visit: Payer: Medicare PPO | Admitting: Family Medicine

## 2013-08-11 ENCOUNTER — Other Ambulatory Visit: Payer: Self-pay | Admitting: Family Medicine

## 2013-08-17 ENCOUNTER — Other Ambulatory Visit: Payer: Self-pay

## 2013-08-17 NOTE — Telephone Encounter (Signed)
Last seen 05/28/13  Last glucose 05/28/13  FPW

## 2013-08-18 MED ORDER — INSULIN LISPRO 100 UNIT/ML ~~LOC~~ SOLN: 45.0000 [IU] | Freq: Three times a day (TID) | SUBCUTANEOUS | Status: DC

## 2013-08-18 NOTE — Telephone Encounter (Signed)
Call patient : Prescription refilled & sent to pharmacy in [facility]. 

## 2013-08-26 ENCOUNTER — Other Ambulatory Visit (INDEPENDENT_AMBULATORY_CARE_PROVIDER_SITE_OTHER): Payer: Medicare PPO | Admitting: *Deleted

## 2013-08-26 ENCOUNTER — Encounter (INDEPENDENT_AMBULATORY_CARE_PROVIDER_SITE_OTHER): Payer: Medicare PPO | Admitting: Nurse Practitioner

## 2013-08-26 DIAGNOSIS — Z23 Encounter for immunization: Secondary | ICD-10-CM

## 2013-08-27 NOTE — Progress Notes (Signed)
   Subjective:    Patient ID: Mary Raymond, female    DOB: [birth year], 62 y.o.   MRN: [id number]  HPI    Review of Systems     Objective:   Physical Exam        Assessment & Plan:  Appointment cancelled

## 2013-09-01 ENCOUNTER — Ambulatory Visit: Payer: Medicare PPO | Admitting: Family Medicine

## 2013-09-07 ENCOUNTER — Ambulatory Visit: Payer: Medicare PPO | Admitting: Family Medicine

## 2013-09-11 ENCOUNTER — Other Ambulatory Visit: Payer: Self-pay | Admitting: Family Medicine

## 2013-09-16 ENCOUNTER — Ambulatory Visit (INDEPENDENT_AMBULATORY_CARE_PROVIDER_SITE_OTHER): Payer: Medicare PPO | Admitting: Family Medicine

## 2013-09-16 ENCOUNTER — Encounter (INDEPENDENT_AMBULATORY_CARE_PROVIDER_SITE_OTHER): Payer: Self-pay

## 2013-09-16 ENCOUNTER — Encounter: Payer: Self-pay | Admitting: *Deleted

## 2013-09-16 VITALS — BP 137/72 | HR 82 | Temp 98.1°F | Ht 65.0 in | Wt 270.0 lb

## 2013-09-16 DIAGNOSIS — E559 Vitamin D deficiency, unspecified: Secondary | ICD-10-CM

## 2013-09-16 DIAGNOSIS — K219 Gastro-esophageal reflux disease without esophagitis: Secondary | ICD-10-CM

## 2013-09-16 DIAGNOSIS — F411 Generalized anxiety disorder: Secondary | ICD-10-CM

## 2013-09-16 DIAGNOSIS — I10 Essential (primary) hypertension: Secondary | ICD-10-CM

## 2013-09-16 DIAGNOSIS — G589 Mononeuropathy, unspecified: Secondary | ICD-10-CM

## 2013-09-16 DIAGNOSIS — M109 Gout, unspecified: Secondary | ICD-10-CM

## 2013-09-16 DIAGNOSIS — E039 Hypothyroidism, unspecified: Secondary | ICD-10-CM

## 2013-09-16 DIAGNOSIS — E785 Hyperlipidemia, unspecified: Secondary | ICD-10-CM

## 2013-09-16 DIAGNOSIS — T7840XS Allergy, unspecified, sequela: Secondary | ICD-10-CM

## 2013-09-16 DIAGNOSIS — H9209 Otalgia, unspecified ear: Secondary | ICD-10-CM

## 2013-09-16 DIAGNOSIS — H60392 Other infective otitis externa, left ear: Secondary | ICD-10-CM

## 2013-09-16 DIAGNOSIS — G629 Polyneuropathy, unspecified: Secondary | ICD-10-CM

## 2013-09-16 DIAGNOSIS — E1059 Type 1 diabetes mellitus with other circulatory complications: Secondary | ICD-10-CM

## 2013-09-16 DIAGNOSIS — E119 Type 2 diabetes mellitus without complications: Secondary | ICD-10-CM

## 2013-09-16 DIAGNOSIS — H8110 Benign paroxysmal vertigo, unspecified ear: Secondary | ICD-10-CM

## 2013-09-16 DIAGNOSIS — E669 Obesity, unspecified: Secondary | ICD-10-CM

## 2013-09-16 DIAGNOSIS — H9202 Otalgia, left ear: Secondary | ICD-10-CM

## 2013-09-16 DIAGNOSIS — J45909 Unspecified asthma, uncomplicated: Secondary | ICD-10-CM

## 2013-09-16 DIAGNOSIS — H60399 Other infective otitis externa, unspecified ear: Secondary | ICD-10-CM

## 2013-09-16 LAB — POCT UA - MICROALBUMIN: Microalbumin Ur, POC: NEGATIVE mg/L

## 2013-09-16 LAB — POCT GLYCOSYLATED HEMOGLOBIN (HGB A1C): Hemoglobin A1C: 7.9

## 2013-09-16 MED ORDER — AMLODIPINE BESYLATE 10 MG PO TABS: 10.0000 mg | ORAL_TABLET | Freq: Every day | ORAL | Status: DC

## 2013-09-16 MED ORDER — OMEPRAZOLE 40 MG PO CPDR: 40.0000 mg | DELAYED_RELEASE_CAPSULE | Freq: Every day | ORAL | Status: DC

## 2013-09-16 MED ORDER — FUROSEMIDE 20 MG PO TABS: 20.0000 mg | ORAL_TABLET | Freq: Every day | ORAL | Status: DC

## 2013-09-16 MED ORDER — LOSARTAN POTASSIUM 100 MG PO TABS: 100.0000 mg | ORAL_TABLET | Freq: Every day | ORAL | Status: DC

## 2013-09-16 MED ORDER — EZETIMIBE 10 MG PO TABS: 10.0000 mg | ORAL_TABLET | Freq: Every day | ORAL | Status: DC

## 2013-09-16 MED ORDER — LEVOTHYROXINE SODIUM 50 MCG PO TABS: 50.0000 ug | ORAL_TABLET | Freq: Every day | ORAL | Status: DC

## 2013-09-16 MED ORDER — MECLIZINE HCL 25 MG PO TABS: 25.0000 mg | ORAL_TABLET | Freq: Three times a day (TID) | ORAL | Status: DC | PRN

## 2013-09-16 MED ORDER — SULFAMETHOXAZOLE-TRIMETHOPRIM 800-160 MG PO TABS: 1.0000 | ORAL_TABLET | Freq: Two times a day (BID) | ORAL | Status: DC

## 2013-09-16 MED ORDER — MONTELUKAST SODIUM 10 MG PO TABS: 10.0000 mg | ORAL_TABLET | Freq: Every day | ORAL | Status: DC

## 2013-09-16 MED ORDER — ALLOPURINOL 300 MG PO TABS: 300.0000 mg | ORAL_TABLET | Freq: Every day | ORAL | Status: DC

## 2013-09-16 MED ORDER — FENOFIBRATE MICRONIZED 134 MG PO CAPS: 134.0000 mg | ORAL_CAPSULE | Freq: Every day | ORAL | Status: DC

## 2013-09-16 NOTE — Patient Instructions (Signed)
Gastroesophageal Reflux Disease, Adult Gastroesophageal reflux disease (GERD) happens when acid from your stomach flows up into the esophagus. When acid comes in contact with the esophagus, the acid causes soreness (inflammation) in the esophagus. Over time, GERD may create small holes (ulcers) in the lining of the esophagus. CAUSES   Increased body weight. This puts pressure on the stomach, making acid rise from the stomach into the esophagus.  Smoking. This increases acid production in the stomach.  Drinking alcohol. This causes decreased pressure in the lower esophageal sphincter (valve or ring of muscle between the esophagus and stomach), allowing acid from the stomach into the esophagus.  Late evening meals and a full stomach. This increases pressure and acid production in the stomach.  A malformed lower esophageal sphincter. Sometimes, no cause is found. SYMPTOMS   Burning pain in the lower part of the mid-chest behind the breastbone and in the mid-stomach area. This may occur twice a week or more often.  Trouble swallowing.  Sore throat.  Dry cough.  Asthma-like symptoms including chest tightness, shortness of breath, or wheezing. DIAGNOSIS  Your caregiver may be able to diagnose GERD based on your symptoms. In some cases, X-rays and other tests may be done to check for complications or to check the condition of your stomach and esophagus. TREATMENT  Your caregiver may recommend over-the-counter or prescription medicines to help decrease acid production. Ask your caregiver before starting or adding any new medicines.  HOME CARE INSTRUCTIONS   Change the factors that you can control. Ask your caregiver for guidance concerning weight loss, quitting smoking, and alcohol consumption.  Avoid foods and drinks that make your symptoms worse, such as:  Caffeine or alcoholic drinks.  Chocolate.  Peppermint or mint flavorings.  Garlic and onions.  Spicy foods.  Citrus fruits,  such as oranges, lemons, or limes.  Tomato-based foods such as sauce, chili, salsa, and pizza.  Fried and fatty foods.  Avoid lying down for the 3 hours prior to your bedtime or prior to taking a nap.  Eat small, frequent meals instead of large meals.  Wear loose-fitting clothing. Do not wear anything tight around your waist that causes pressure on your stomach.  Raise the head of your bed 6 to 8 inches with wood blocks to help you sleep. Extra pillows will not help.  Only take over-the-counter or prescription medicines for pain, discomfort, or fever as directed by your caregiver.  Do not take aspirin, ibuprofen, or other nonsteroidal anti-inflammatory drugs (NSAIDs). SEEK IMMEDIATE MEDICAL CARE IF:   You have pain in your arms, neck, jaw, teeth, or back.  Your pain increases or changes in intensity or duration.  You develop nausea, vomiting, or sweating (diaphoresis).  You develop shortness of breath, or you faint.  Your vomit is green, yellow, black, or looks like coffee grounds or blood.  Your stool is red, bloody, or black. These symptoms could be signs of other problems, such as heart disease, gastric bleeding, or esophageal bleeding. MAKE SURE YOU:   Understand these instructions.  Will watch your condition.  Will get help right away if you are not doing well or get worse. Document Released: 06/13/2005 Document Revised: 11/26/2011 Document Reviewed: 03/23/2011 [facility] Patient Information 2014 [place], [place].               Dr Paula Libra Recommendations  For nutrition information, I recommend books:  1).Eat to Live by Dr Excell Seltzer. 2).Prevent and Reverse Heart Disease by Dr Karl Luke. 3) Dr Nori Riis  [employer]'s Book:  Program to Reverse Diabetes  Exercise recommendations are:  If unable to walk, then the patient can exercise in a chair 3 times a day. By flapping arms like a bird gently and raising legs outwards to the front.  If ambulatory, the  patient can go for walks for 30 minutes 3 times a week. Then increase the intensity and duration as tolerated.  Goal is to try to attain exercise frequency to 5 times a week.  If applicable: Best to perform resistance exercises (machines or weights) 2 days a week and cardio type exercises 3 days per week.   Diabetes and Foot Care Diabetes may cause you to have problems because of poor blood supply (circulation) to your feet and legs. This may cause the skin on your feet to become thinner, break easier, and heal more slowly. Your skin may become dry, and the skin may peel and crack. You may also have nerve damage in your legs and feet causing decreased feeling in them. You may not notice minor injuries to your feet that could lead to infections or more serious problems. Taking care of your feet is one of the most important things you can do for yourself.  HOME CARE INSTRUCTIONS  Wear shoes at all times, even in the house. Do not go barefoot. Bare feet are easily injured.  Check your feet daily for blisters, cuts, and redness. If you cannot see the bottom of your feet, use a mirror or ask someone for help.  Wash your feet with warm water (do not use hot water) and mild soap. Then pat your feet and the areas between your toes until they are completely dry. Do not soak your feet as this can dry your skin.  Apply a moisturizing lotion or petroleum jelly (that does not contain alcohol and is unscented) to the skin on your feet and to dry, brittle toenails. Do not apply lotion between your toes.  Trim your toenails straight across. Do not dig under them or around the cuticle. File the edges of your nails with an emery board or nail file.  Do not cut corns or calluses or try to remove them with medicine.  Wear clean socks or stockings every day. Make sure they are not too tight. Do not wear knee-high stockings since they may decrease blood flow to your legs.  Wear shoes that fit properly and have  enough cushioning. To break in new shoes, wear them for just a few hours a day. This prevents you from injuring your feet. Always look in your shoes before you put them on to be sure there are no objects inside.  Do not cross your legs. This may decrease the blood flow to your feet.  If you find a minor scrape, cut, or break in the skin on your feet, keep it and the skin around it clean and dry. These areas may be cleansed with mild soap and water. Do not cleanse the area with peroxide, alcohol, or iodine.  When you remove an adhesive bandage, be sure not to damage the skin around it.  If you have a wound, look at it several times a day to make sure it is healing.  Do not use heating pads or hot water bottles. They may burn your skin. If you have lost feeling in your feet or legs, you may not know it is happening until it is too late.  Make sure your health care provider performs a complete foot exam at  least annually or more often if you have foot problems. Report any cuts, sores, or bruises to your health care provider immediately. SEEK MEDICAL CARE IF:   You have an injury that is not healing.  You have cuts or breaks in the skin.  You have an ingrown nail.  You notice redness on your legs or feet.  You feel burning or tingling in your legs or feet.  You have pain or cramps in your legs and feet.  Your legs or feet are numb.  Your feet always feel cold. SEEK IMMEDIATE MEDICAL CARE IF:   There is increasing redness, swelling, or pain in or around a wound.  There is a red line that goes up your leg.  Pus is coming from a wound.  You develop a fever or as directed by your health care provider.  You notice a bad smell coming from an ulcer or wound. Document Released: 08/31/2000 Document Revised: 05/06/2013 Document Reviewed: 02/10/2013 [facility] Patient Information 2014 [place].
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
## 2013-09-18 LAB — CMP14+EGFR
ALT: 33 IU/L — ABNORMAL HIGH (ref 0–32)
AST: 46 IU/L — ABNORMAL HIGH (ref 0–40)
Albumin/Globulin Ratio: 1.6 (ref 1.1–2.5)
Albumin: 4.2 g/dL (ref 3.6–4.8)
Alkaline Phosphatase: 75 IU/L (ref 39–117)
BUN/Creatinine Ratio: 25 (ref 11–26)
BUN: 22 mg/dL (ref 8–27)
CO2: 25 mmol/L (ref 18–29)
Calcium: 10.3 mg/dL — ABNORMAL HIGH (ref 8.6–10.2)
Chloride: 100 mmol/L (ref 97–108)
Creatinine, Ser: 0.87 mg/dL (ref 0.57–1.00)
GFR calc Af Amer: 83 mL/min/{1.73_m2} (ref >59)
GFR calc non Af Amer: 72 mL/min/{1.73_m2} (ref >59)
Globulin, Total: 2.6 g/dL (ref 1.5–4.5)
Glucose: 127 mg/dL — ABNORMAL HIGH (ref 65–99)
Potassium: 5.1 mmol/L (ref 3.5–5.2)
Sodium: 143 mmol/L (ref 134–144)
Total Bilirubin: 0.3 mg/dL (ref 0.0–1.2)
Total Protein: 6.8 g/dL (ref 6.0–8.5)

## 2013-09-18 LAB — NMR, LIPOPROFILE
Cholesterol: 245 mg/dL — ABNORMAL HIGH (ref <200)
HDL Cholesterol by NMR: 45 mg/dL (ref >=40)
HDL Particle Number: 34.2 umol/L (ref >=30.5)
LDL Particle Number: 2517 nmol/L — ABNORMAL HIGH (ref <1000)
LDL Size: 20.5 nm — ABNORMAL LOW (ref >20.5)
LDLC SERPL CALC-MCNC: 145 mg/dL — ABNORMAL HIGH (ref <100)
LP-IR Score: 84 — ABNORMAL HIGH (ref <=45)
Small LDL Particle Number: 1601 nmol/L — ABNORMAL HIGH (ref <=527)
Triglycerides by NMR: 274 mg/dL — ABNORMAL HIGH (ref <150)

## 2013-09-18 LAB — HEPATIC FUNCTION PANEL: Bilirubin, Direct: 0.12 mg/dL (ref 0.00–0.40)

## 2013-09-18 LAB — VITAMIN D 25 HYDROXY (VIT D DEFICIENCY, FRACTURES): Vit D, 25-Hydroxy: 24.8 ng/mL — ABNORMAL LOW (ref 30.0–100.0)

## 2013-09-29 ENCOUNTER — Telehealth: Payer: Self-pay | Admitting: *Deleted

## 2013-09-29 NOTE — Telephone Encounter (Signed)
LABS DISCUSSED WITH PT AND VERBALIZED UNDERSTANDING.

## 2013-09-30 ENCOUNTER — Other Ambulatory Visit: Payer: Self-pay | Admitting: Family Medicine

## 2013-10-12 ENCOUNTER — Other Ambulatory Visit: Payer: Self-pay | Admitting: Family Medicine

## 2013-12-08 ENCOUNTER — Telehealth: Payer: Self-pay | Admitting: Family Medicine

## 2013-12-10 ENCOUNTER — Other Ambulatory Visit: Payer: Self-pay | Admitting: *Deleted

## 2013-12-10 DIAGNOSIS — K219 Gastro-esophageal reflux disease without esophagitis: Secondary | ICD-10-CM

## 2013-12-10 DIAGNOSIS — E785 Hyperlipidemia, unspecified: Secondary | ICD-10-CM

## 2013-12-10 MED ORDER — EZETIMIBE 10 MG PO TABS: 10.0000 mg | ORAL_TABLET | Freq: Every day | ORAL | Status: DC

## 2013-12-10 MED ORDER — OMEPRAZOLE 40 MG PO CPDR: 40.0000 mg | DELAYED_RELEASE_CAPSULE | Freq: Every day | ORAL | Status: DC

## 2013-12-10 NOTE — Telephone Encounter (Signed)
Just received today, will do within 48 hrs

## 2013-12-21 ENCOUNTER — Other Ambulatory Visit: Payer: Self-pay | Admitting: *Deleted

## 2013-12-21 NOTE — Telephone Encounter (Signed)
Last ov 12/14. Please print for mail order.

## 2013-12-24 ENCOUNTER — Other Ambulatory Visit: Payer: Self-pay | Admitting: Family Medicine

## 2013-12-25 MED ORDER — ACYCLOVIR 400 MG PO TABS: ORAL_TABLET | ORAL | Status: DC

## 2013-12-25 NOTE — Telephone Encounter (Signed)
Call patient : Prescription refilled & sent to pharmacy in [facility]. 

## 2014-01-14 ENCOUNTER — Other Ambulatory Visit: Payer: Self-pay | Admitting: Family Medicine

## 2014-01-27 ENCOUNTER — Other Ambulatory Visit: Payer: Self-pay | Admitting: Nurse Practitioner

## 2014-01-28 ENCOUNTER — Other Ambulatory Visit: Payer: Self-pay

## 2014-01-28 MED ORDER — INSULIN PEN NEEDLE 31G X 5 MM MISC: Status: DC

## 2014-02-01 ENCOUNTER — Other Ambulatory Visit: Payer: Self-pay

## 2014-02-01 MED ORDER — INSULIN GLARGINE 100 UNIT/ML SOLOSTAR PEN: PEN_INJECTOR | SUBCUTANEOUS | Status: DC

## 2014-02-01 NOTE — Telephone Encounter (Signed)
Patient NTBS for follow up and lab work  

## 2014-02-01 NOTE — Telephone Encounter (Signed)
Last seen and last glucose 09/16/13 FPW

## 2014-03-02 ENCOUNTER — Other Ambulatory Visit: Payer: Self-pay | Admitting: Nurse Practitioner

## 2014-03-04 NOTE — Telephone Encounter (Signed)
Last seen 08/2013

## 2014-03-04 NOTE — Telephone Encounter (Signed)
Please make sure that this patient makes an appointment to see a provider for followup

## 2014-03-05 NOTE — Telephone Encounter (Signed)
Patient aware and already has an appointment for next week

## 2014-03-08 ENCOUNTER — Ambulatory Visit: Payer: Medicare PPO | Admitting: Family

## 2014-03-11 ENCOUNTER — Ambulatory Visit: Payer: Medicare PPO | Admitting: Physician Assistant

## 2014-03-12 ENCOUNTER — Encounter: Payer: Self-pay | Admitting: Physician Assistant

## 2014-03-12 ENCOUNTER — Ambulatory Visit (INDEPENDENT_AMBULATORY_CARE_PROVIDER_SITE_OTHER): Payer: Medicare PPO | Admitting: Physician Assistant

## 2014-03-12 VITALS — BP 128/58 | HR 94 | Temp 98.3°F | Ht 65.0 in | Wt 285.2 lb

## 2014-03-12 DIAGNOSIS — E785 Hyperlipidemia, unspecified: Secondary | ICD-10-CM

## 2014-03-12 DIAGNOSIS — K2100 Gastro-esophageal reflux disease with esophagitis, without bleeding: Secondary | ICD-10-CM

## 2014-03-12 DIAGNOSIS — H8110 Benign paroxysmal vertigo, unspecified ear: Secondary | ICD-10-CM

## 2014-03-12 DIAGNOSIS — E1165 Type 2 diabetes mellitus with hyperglycemia: Secondary | ICD-10-CM

## 2014-03-12 DIAGNOSIS — I10 Essential (primary) hypertension: Secondary | ICD-10-CM

## 2014-03-12 DIAGNOSIS — E118 Type 2 diabetes mellitus with unspecified complications: Secondary | ICD-10-CM

## 2014-03-12 DIAGNOSIS — T7840XS Allergy, unspecified, sequela: Secondary | ICD-10-CM

## 2014-03-12 DIAGNOSIS — IMO0002 Reserved for concepts with insufficient information to code with codable children: Secondary | ICD-10-CM

## 2014-03-12 DIAGNOSIS — H8112 Benign paroxysmal vertigo, left ear: Secondary | ICD-10-CM

## 2014-03-12 LAB — POCT GLYCOSYLATED HEMOGLOBIN (HGB A1C): Hemoglobin A1C: 8.8

## 2014-03-12 MED ORDER — FENOFIBRATE MICRONIZED 134 MG PO CAPS: 134.0000 mg | ORAL_CAPSULE | Freq: Every day | ORAL | Status: DC

## 2014-03-12 MED ORDER — FLUTICASONE PROPIONATE 50 MCG/ACT NA SUSP: 2.0000 | Freq: Every day | NASAL | Status: DC

## 2014-03-12 MED ORDER — EXENATIDE ER 2 MG ~~LOC~~ SUSR: SUBCUTANEOUS | Status: DC

## 2014-03-12 MED ORDER — LEVOTHYROXINE SODIUM 50 MCG PO TABS: 50.0000 ug | ORAL_TABLET | Freq: Every day | ORAL | Status: DC

## 2014-03-12 MED ORDER — MECLIZINE HCL 25 MG PO TABS: 25.0000 mg | ORAL_TABLET | Freq: Three times a day (TID) | ORAL | Status: DC | PRN

## 2014-03-12 MED ORDER — LOSARTAN POTASSIUM 100 MG PO TABS: 100.0000 mg | ORAL_TABLET | Freq: Every day | ORAL | Status: DC

## 2014-03-12 MED ORDER — MONTELUKAST SODIUM 10 MG PO TABS: 10.0000 mg | ORAL_TABLET | Freq: Every day | ORAL | Status: DC

## 2014-03-12 MED ORDER — ACYCLOVIR 400 MG PO TABS: ORAL_TABLET | ORAL | Status: DC

## 2014-03-12 MED ORDER — FUROSEMIDE 20 MG PO TABS: 20.0000 mg | ORAL_TABLET | Freq: Every day | ORAL | Status: DC

## 2014-03-12 MED ORDER — EZETIMIBE 10 MG PO TABS: 10.0000 mg | ORAL_TABLET | Freq: Every day | ORAL | Status: DC

## 2014-03-12 MED ORDER — AMLODIPINE BESYLATE 10 MG PO TABS: 10.0000 mg | ORAL_TABLET | Freq: Every day | ORAL | Status: DC

## 2014-03-12 MED ORDER — OMEPRAZOLE 40 MG PO CPDR: 40.0000 mg | DELAYED_RELEASE_CAPSULE | Freq: Every day | ORAL | Status: DC

## 2014-03-12 NOTE — Progress Notes (Signed)
Subjective:     Patient ID: Mary Raymond, female   DOB: [birth year], 63 y.o.   MRN: [id number]  HPI Pt here for review of HTN, Hyperlipid, IDDM, and Obesity She is 3 months past due for her f/u States she has had things come up and has not been able to get here When questioned about here BS she states it has been elevated due to her lack of exercise and diet Pt with 15lb wgt gain per same She is having more issues with her neuropathy  Review of Systems  Constitutional: Positive for activity change, appetite change and fatigue. Negative for unexpected weight change.  HENT: Negative.   Eyes: Negative.   Respiratory: Negative.   Cardiovascular: Negative.   Gastrointestinal: Negative.   Endocrine: Negative.   Neurological: Positive for dizziness, weakness and numbness.       Objective:   Physical Exam  Nursing note and vitals reviewed. Constitutional: She is oriented to person, place, and time. She appears well-developed and well-nourished.  HENT:  Right Ear: External ear normal.  Left Ear: External ear normal.  Nose: Nose normal.  Mouth/Throat: Oropharynx is clear and moist. No oropharyngeal exudate.  Neck: Neck supple. No JVD present. No thyromegaly present.  Cardiovascular: Normal rate, regular rhythm, normal heart sounds and intact distal pulses.   Pulmonary/Chest: Effort normal and breath sounds normal. She has no wheezes. She has no rales.  Musculoskeletal: She exhibits edema. She exhibits no tenderness.  No lower ext edema/ulcerations  Lymphadenopathy:    She has no cervical adenopathy.  Neurological: She is alert and oriented to person, place, and time.  Psychiatric: Her behavior is normal. Judgment and thought content normal.  Pt tearful when explaining her situation       Assessment:     IDDM HTN Hyperlipid Vertigo Perph. neuropathy    Plan:     Again stressed eating healthy and staying as active as possible Discussed referral to Neuro due to the  neuropathy and vertigo She states she had a bad experience once and would like to think about it She is overdue for eye and dental exam Full labs done today will inform of lab results Rx rf done by script since mail order F/U in 3 months

## 2014-03-12 NOTE — Patient Instructions (Signed)
Diabetes Mellitus and Food It is important for you to manage your blood sugar (glucose) level. Your blood glucose level can be greatly affected by what you eat. Eating healthier foods in the appropriate amounts throughout the day at about the same time each day will help you control your blood glucose level. It can also help slow or prevent worsening of your diabetes mellitus. Healthy eating may even help you improve the level of your blood pressure and reach or maintain a healthy weight.  HOW CAN FOOD AFFECT ME? Carbohydrates Carbohydrates affect your blood glucose level more than any other type of food. Your dietitian will help you determine how many carbohydrates to eat at each meal and teach you how to count carbohydrates. Counting carbohydrates is important to keep your blood glucose at a healthy level, especially if you are using insulin or taking certain medicines for diabetes mellitus. Alcohol Alcohol can cause sudden decreases in blood glucose (hypoglycemia), especially if you use insulin or take certain medicines for diabetes mellitus. Hypoglycemia can be a life-threatening condition. Symptoms of hypoglycemia (sleepiness, dizziness, and disorientation) are similar to symptoms of having too much alcohol.  If your health care provider has given you approval to drink alcohol, do so in moderation and use the following guidelines:  Women should not have more than one drink per day, and men should not have more than two drinks per day. One drink is equal to:  12 oz of beer.  5 oz of wine.  1 oz of hard liquor.  Do not drink on an empty stomach.  Keep yourself hydrated. Have water, diet soda, or unsweetened iced tea.  Regular soda, juice, and other mixers might contain a lot of carbohydrates and should be counted. WHAT FOODS ARE NOT RECOMMENDED? As you make food choices, it is important to remember that all foods are not the same. Some foods have fewer nutrients per serving than other  foods, even though they might have the same number of calories or carbohydrates. It is difficult to get your body what it needs when you eat foods with fewer nutrients. Examples of foods that you should avoid that are high in calories and carbohydrates but low in nutrients include:  Trans fats (most processed foods list trans fats on the Nutrition Facts label).  Regular soda.  Juice.  Candy.  Sweets, such as cake, pie, doughnuts, and cookies.  Fried foods. WHAT FOODS CAN I EAT? Have nutrient-rich foods, which will nourish your body and keep you healthy. The food you should eat also will depend on several factors, including:  The calories you need.  The medicines you take.  Your weight.  Your blood glucose level.  Your blood pressure level.  Your cholesterol level. You also should eat a variety of foods, including:  Protein, such as meat, poultry, fish, tofu, nuts, and seeds (lean animal proteins are best).  Fruits.  Vegetables.  Dairy products, such as milk, cheese, and yogurt (low fat is best).  Breads, grains, pasta, cereal, rice, and beans.  Fats such as olive oil, trans fat-free margarine, canola oil, avocado, and olives. DOES EVERYONE WITH DIABETES MELLITUS HAVE THE SAME MEAL PLAN? Because every person with diabetes mellitus is different, there is not one meal plan that works for everyone. It is very important that you meet with a dietitian who will help you create a meal plan that is just right for you. Document Released: 05/31/2005 Document Revised: 09/08/2013 Document Reviewed: 07/31/2013 [employer] Patient Information 2015 [employer]. This   information is not intended to replace advice given to you by your health care provider. Make sure you discuss any questions you have with your health care provider.  

## 2014-03-13 LAB — LIPID PANEL
CHOL/HDL RATIO: 6.6 ratio — AB (ref 0.0–4.4)
Cholesterol, Total: 269 mg/dL — ABNORMAL HIGH (ref 100–199)
HDL: 41 mg/dL (ref >39)
LDL Calculated: 154 mg/dL — ABNORMAL HIGH (ref 0–99)
Triglycerides: 372 mg/dL — ABNORMAL HIGH (ref 0–149)
VLDL Cholesterol Cal: 74 mg/dL — ABNORMAL HIGH (ref 5–40)

## 2014-03-13 LAB — CMP14+EGFR
A/G RATIO: 1.8 (ref 1.1–2.5)
ALT: 35 IU/L — AB (ref 0–32)
AST: 51 IU/L — ABNORMAL HIGH (ref 0–40)
Albumin: 4.3 g/dL (ref 3.6–4.8)
Alkaline Phosphatase: 73 IU/L (ref 39–117)
BUN/Creatinine Ratio: 36 — ABNORMAL HIGH (ref 11–26)
BUN: 30 mg/dL — ABNORMAL HIGH (ref 8–27)
CO2: 25 mmol/L (ref 18–29)
CREATININE: 0.84 mg/dL (ref 0.57–1.00)
Calcium: 10.3 mg/dL (ref 8.7–10.3)
Chloride: 96 mmol/L — ABNORMAL LOW (ref 97–108)
GFR calc Af Amer: 86 mL/min/{1.73_m2} (ref >59)
GFR, EST NON AFRICAN AMERICAN: 75 mL/min/{1.73_m2} (ref >59)
Globulin, Total: 2.4 g/dL (ref 1.5–4.5)
Glucose: 214 mg/dL — ABNORMAL HIGH (ref 65–99)
Potassium: 5.8 mmol/L — ABNORMAL HIGH (ref 3.5–5.2)
Sodium: 140 mmol/L (ref 134–144)
TOTAL PROTEIN: 6.7 g/dL (ref 6.0–8.5)
Total Bilirubin: 0.3 mg/dL (ref 0.0–1.2)

## 2014-03-21 ENCOUNTER — Other Ambulatory Visit: Payer: Self-pay | Admitting: Family Medicine

## 2014-03-21 NOTE — Progress Notes (Signed)
Pt not seen.

## 2014-03-24 ENCOUNTER — Telehealth: Payer: Self-pay | Admitting: Family Medicine

## 2014-03-24 MED ORDER — INSULIN LISPRO 100 UNIT/ML ~~LOC~~ SOLN: SUBCUTANEOUS | Status: DC

## 2014-03-25 ENCOUNTER — Ambulatory Visit (INDEPENDENT_AMBULATORY_CARE_PROVIDER_SITE_OTHER): Payer: Medicare PPO

## 2014-03-25 ENCOUNTER — Ambulatory Visit (INDEPENDENT_AMBULATORY_CARE_PROVIDER_SITE_OTHER): Payer: Medicare PPO | Admitting: Family Medicine

## 2014-03-25 ENCOUNTER — Encounter: Payer: Self-pay | Admitting: Family Medicine

## 2014-03-25 VITALS — BP 160/80 | HR 89 | Temp 98.8°F | Ht 65.0 in | Wt 286.0 lb

## 2014-03-25 DIAGNOSIS — R0789 Other chest pain: Secondary | ICD-10-CM

## 2014-03-25 DIAGNOSIS — R0602 Shortness of breath: Secondary | ICD-10-CM

## 2014-03-25 DIAGNOSIS — J45909 Unspecified asthma, uncomplicated: Secondary | ICD-10-CM

## 2014-03-25 DIAGNOSIS — F419 Anxiety disorder, unspecified: Secondary | ICD-10-CM

## 2014-03-25 DIAGNOSIS — F410 Panic disorder [episodic paroxysmal anxiety] without agoraphobia: Secondary | ICD-10-CM

## 2014-03-25 DIAGNOSIS — R198 Other specified symptoms and signs involving the digestive system and abdomen: Secondary | ICD-10-CM

## 2014-03-25 DIAGNOSIS — E669 Obesity, unspecified: Secondary | ICD-10-CM

## 2014-03-25 DIAGNOSIS — F411 Generalized anxiety disorder: Secondary | ICD-10-CM

## 2014-03-25 LAB — POCT CBC
Granulocyte percent: 70.6 %G (ref 37–80)
HCT, POC: 37.9 % (ref 37.7–47.9)
Hemoglobin: 12.3 g/dL (ref 12.2–16.2)
LYMPH, POC: 2.8 (ref 0.6–3.4)
MCH: 27.1 pg (ref 27–31.2)
MCHC: 32.3 g/dL (ref 31.8–35.4)
MCV: 83.8 fL (ref 80–97)
MPV: 10.4 fL (ref 0–99.8)
PLATELET COUNT, POC: 211 10*3/uL (ref 142–424)
POC Granulocyte: 7.6 — AB (ref 2–6.9)
POC LYMPH %: 26 % (ref 10–50)
RBC: 4.5 M/uL (ref 4.04–5.48)
RDW, POC: 14.3 %
WBC: 10.8 10*3/uL — AB (ref 4.6–10.2)

## 2014-03-25 MED ORDER — ALBUTEROL SULFATE (2.5 MG/3ML) 0.083% IN NEBU
2.5000 mg | INHALATION_SOLUTION | RESPIRATORY_TRACT | Status: AC
  Administered 2014-03-25: 2.5 mg via RESPIRATORY_TRACT

## 2014-03-25 MED ORDER — ALBUTEROL SULFATE (2.5 MG/3ML) 0.083% IN NEBU: 2.5000 mg | INHALATION_SOLUTION | Freq: Four times a day (QID) | RESPIRATORY_TRACT | Status: DC | PRN

## 2014-03-25 MED ORDER — BUDESONIDE 0.25 MG/2ML IN SUSP
0.2500 mg | RESPIRATORY_TRACT | Status: AC
  Administered 2014-03-25: 0.25 mg via RESPIRATORY_TRACT

## 2014-03-25 MED ORDER — IPRATROPIUM-ALBUTEROL 18-103 MCG/ACT IN AERO: 2.0000 | INHALATION_SPRAY | RESPIRATORY_TRACT | Status: DC | PRN

## 2014-03-25 MED ORDER — BUSPIRONE HCL 15 MG PO TABS: 7.5000 mg | ORAL_TABLET | Freq: Two times a day (BID) | ORAL | Status: DC

## 2014-03-25 NOTE — Patient Instructions (Addendum)
We will get you a new nebulizer machine We will refill your nebulizer medication We will call in a new prescription for an albuterol inhaler which will be used as a rescue inhaler We will call in a prescription for Symbicort 164.52 puffs twice daily, rinse mouth after use We will call you with the results of the chest x-ray as soon as those are available we will get a CBC and a BMP which were checked for infection and reevaluate your potassium which has been low For cough and congestion, Mucinex plain, blue and white in color, over-the-counter, one twice daily with a large glass of water  Please use your inhalers regularly Drink plenty of water

## 2014-03-25 NOTE — Addendum Note (Signed)
Addended by: Marin Olp on: 03/25/2014 12:15 PM   Modules accepted: Orders

## 2014-03-25 NOTE — Addendum Note (Signed)
Addended by: Marin Olp on: 03/25/2014 01:20 PM   Modules accepted: Orders

## 2014-03-25 NOTE — Progress Notes (Addendum)
Subjective:    Patient ID: Mary Raymond, female    DOB: 22-Jan-1951, 63 y.o.   MRN: [id number]  HPI Pt is here today for asthma attack due to heat from air conditioning going out yesterday. Shortness of breath, anxiety used expired inhaler that relieved her symptoms slightly. The patient comes to the visit today with her son. She indicates that her Combivent inhaler has expired. She has no other inhalers at home. Her nebulizer is broken.          Review of Systems  Constitutional: Negative for fever.  Respiratory: Positive for cough, shortness of breath and wheezing.   Cardiovascular: Positive for chest pain (chest pressure).  Psychiatric/Behavioral: Positive for agitation (some anxiousness).       Objective:   Physical Exam  Nursing note and vitals reviewed. Constitutional: She is oriented to person, place, and time. She appears well-developed and well-nourished. She appears distressed.  Patient is anxious feeling pressure on her chest and overweight and complaining of shortness of breath. Her initial pulse ox was 97%. The patient also complains of weight gain and that her sister had a large tumor in her abdomen.  HENT:  Head: Normocephalic and atraumatic.  Right Ear: External ear normal.  Left Ear: External ear normal.  Mouth/Throat: Oropharynx is clear and moist. No oropharyngeal exudate.  Nasal congestion bilaterally  Eyes: Conjunctivae and EOM are normal. Pupils are equal, round, and reactive to light. Right eye exhibits no discharge. Left eye exhibits no discharge. No scleral icterus.  Neck: Normal range of motion. Neck supple. No thyromegaly present.  Cardiovascular: Normal rate, regular rhythm, normal heart sounds and intact distal pulses.  Exam reveals no gallop and no friction rub.   No murmur heard. At 84 per minute  Pulmonary/Chest: Effort normal. No respiratory distress. She has wheezes (rare wheeze). She has no rales. She exhibits no tenderness.    Abdominal: Soft. Bowel sounds are normal. She exhibits no mass. There is tenderness. There is no rebound and no guarding.  Appears to be morbidly obese. There is generalized abdominal tenderness without masses.  Musculoskeletal: Normal range of motion. She exhibits no edema and no tenderness.  Lymphadenopathy:    She has no cervical adenopathy.  Neurological: She is alert and oriented to person, place, and time. She has normal reflexes. No cranial nerve deficit.  Patient felt lightheaded with going to the lab for lab work  Skin: Skin is warm and dry.  Psychiatric: She has a normal mood and affect. Her behavior is normal. Judgment and thought content normal.  Extremely anxious   BP 160/80  Pulse 89  SpO2 98%  The patient received a neb treatment with albuterol and Pulmicort and her lungs sounded clearer following the treatment.  [facility] reading (PRIMARY) by  Dr.Yanina Knupp-chest x-ray--no active disease  Results for orders placed in visit on 03/25/14  POCT CBC      Result Value Ref Range   WBC 10.8 (*) 4.6 - 10.2 K/uL   Lymph, poc 2.8  0.6 - 3.4   POC LYMPH PERCENT 26.0  10 - 50 %L   POC Granulocyte 7.6 (*) 2 - 6.9   Granulocyte percent 70.6  37 - 80 %G   RBC 4.5  4.04 - 5.48 M/uL   Hemoglobin 12.3  12.2 - 16.2 g/dL   HCT, POC 37.9  37.7 - 47.9 %   MCV 83.8  80 - 97 fL   MCH, POC 27.1  27 - 31.2 pg   MCHC 32.3  31.8 - 35.4 g/dL   RDW, POC 14.3     Platelet Count, POC 211.0  142 - 424 K/uL   MPV 10.4  0 - 99.8 fL   The patient was aware of the CBC results before she left the office.                                      Assessment & Plan:  1. Chest pressure - EKG 12-Lead - DG Chest 2 View - BMP8+EGFR  2. SOB (shortness of breath) - POCT CBC - DG Chest 2 View - BMP8+EGFR  3. Anxiety - BMP8+EGFR -BuSpar 15 mg one half twice daily as needed for anxiety  4. Asthma, unspecified asthma severity, uncomplicated - SNK5+LZJQ  5. Obesity - BMP8+EGFR  6. Panic disorder  7.  Hypokalemia  Patient Instructions  We will get you a new nebulizer machine We will refill your nebulizer medication We will call in a new prescription for an albuterol inhaler which will be used as a rescue inhaler We will call in a prescription for Symbicort 164.52 puffs twice daily, rinse mouth after use We will call you with the results of the chest x-ray as soon as those are available we will get a CBC and a BMP which were checked for infection and reevaluate your potassium which has been low For cough and congestion, Mucinex plain, blue and white in color, over-the-counter, one twice daily with a large glass of water  Please use your inhalers regularly Drink plenty of water   Arrie Senate MD

## 2014-03-26 ENCOUNTER — Telehealth: Payer: Self-pay

## 2014-03-26 LAB — BMP8+EGFR
BUN / CREAT RATIO: 25 (ref 11–26)
BUN: 21 mg/dL (ref 8–27)
CO2: 24 mmol/L (ref 18–29)
Calcium: 10.3 mg/dL (ref 8.7–10.3)
Chloride: 100 mmol/L (ref 97–108)
Creatinine, Ser: 0.84 mg/dL (ref 0.57–1.00)
GFR calc Af Amer: 86 mL/min/{1.73_m2} (ref >59)
GFR calc non Af Amer: 75 mL/min/{1.73_m2} (ref >59)
Glucose: 209 mg/dL — ABNORMAL HIGH (ref 65–99)
POTASSIUM: 4.5 mmol/L (ref 3.5–5.2)
SODIUM: 140 mmol/L (ref 134–144)

## 2014-03-26 NOTE — Telephone Encounter (Signed)
Message copied by Koren Bound on Fri Mar 26, 2014  3:54 PM ------      Message from: Chipper Herb      Created: Thu Mar 25, 2014  6:31 PM       As per radiology report ------

## 2014-03-26 NOTE — Telephone Encounter (Signed)
Pt aware of CXR results.

## 2014-04-05 ENCOUNTER — Other Ambulatory Visit: Payer: Self-pay | Admitting: *Deleted

## 2014-04-05 ENCOUNTER — Telehealth: Payer: Self-pay | Admitting: Nurse Practitioner

## 2014-04-05 DIAGNOSIS — I10 Essential (primary) hypertension: Secondary | ICD-10-CM

## 2014-04-05 DIAGNOSIS — T7840XS Allergy, unspecified, sequela: Secondary | ICD-10-CM

## 2014-04-05 DIAGNOSIS — H8112 Benign paroxysmal vertigo, left ear: Secondary | ICD-10-CM

## 2014-04-05 DIAGNOSIS — E785 Hyperlipidemia, unspecified: Secondary | ICD-10-CM

## 2014-04-05 MED ORDER — NITROFURANTOIN MONOHYD MACRO 100 MG PO CAPS: 100.0000 mg | ORAL_CAPSULE | Freq: Two times a day (BID) | ORAL | Status: DC

## 2014-04-05 MED ORDER — MECLIZINE HCL 25 MG PO TABS: 25.0000 mg | ORAL_TABLET | Freq: Three times a day (TID) | ORAL | Status: DC | PRN

## 2014-04-05 MED ORDER — MONTELUKAST SODIUM 10 MG PO TABS: 10.0000 mg | ORAL_TABLET | Freq: Every day | ORAL | Status: DC

## 2014-04-05 MED ORDER — FUROSEMIDE 20 MG PO TABS: 20.0000 mg | ORAL_TABLET | Freq: Every day | ORAL | Status: DC

## 2014-04-05 MED ORDER — FENOFIBRATE MICRONIZED 134 MG PO CAPS: 134.0000 mg | ORAL_CAPSULE | Freq: Every day | ORAL | Status: DC

## 2014-04-05 MED ORDER — AMLODIPINE BESYLATE 10 MG PO TABS: 10.0000 mg | ORAL_TABLET | Freq: Every day | ORAL | Status: DC

## 2014-04-05 NOTE — Telephone Encounter (Signed)
Patient aware.

## 2014-04-05 NOTE — Telephone Encounter (Signed)
macrobid sent to pharmacy- will have to n=be seen next time- we do not usually do this without being seen

## 2014-04-05 NOTE — Telephone Encounter (Signed)
Patient states that she doesn't have the money to come in and wants to know if we can call something in.

## 2014-05-18 ENCOUNTER — Telehealth: Payer: Self-pay | Admitting: Family Medicine

## 2014-05-18 ENCOUNTER — Other Ambulatory Visit: Payer: Self-pay | Admitting: *Deleted

## 2014-05-18 MED ORDER — GLUCOSE BLOOD VI STRP: ORAL_STRIP | Status: DC

## 2014-05-19 NOTE — Telephone Encounter (Signed)
Done on 05/18/14.

## 2014-05-20 ENCOUNTER — Other Ambulatory Visit: Payer: Self-pay

## 2014-05-20 MED ORDER — GLUCOSE BLOOD VI STRP
ORAL_STRIP | Status: DC
Start: 2014-05-20 — End: 2014-12-09

## 2014-05-25 ENCOUNTER — Other Ambulatory Visit: Payer: Self-pay | Admitting: Family Medicine

## 2014-05-26 NOTE — Telephone Encounter (Signed)
Last ov 03/12/14 with [facility]. Last AIC 8.8 on 6/15.

## 2014-05-26 NOTE — Telephone Encounter (Signed)
no more refills without being seen  

## 2014-06-01 ENCOUNTER — Other Ambulatory Visit: Payer: Self-pay | Admitting: *Deleted

## 2014-06-01 MED ORDER — "INSULIN SYRINGE-NEEDLE U-100 31G X 5/16"" 0.5 ML MISC": Status: DC

## 2014-06-01 NOTE — Telephone Encounter (Signed)
Last AIc 03/12/14. AIC 8.8

## 2014-06-16 ENCOUNTER — Encounter: Payer: Self-pay | Admitting: Family

## 2014-06-16 ENCOUNTER — Encounter (INDEPENDENT_AMBULATORY_CARE_PROVIDER_SITE_OTHER): Payer: Self-pay

## 2014-06-16 ENCOUNTER — Ambulatory Visit (INDEPENDENT_AMBULATORY_CARE_PROVIDER_SITE_OTHER): Payer: Medicare PPO | Admitting: Family

## 2014-06-16 VITALS — BP 155/82 | HR 97 | Temp 97.2°F | Ht 65.0 in | Wt 281.8 lb

## 2014-06-16 DIAGNOSIS — I10 Essential (primary) hypertension: Secondary | ICD-10-CM

## 2014-06-16 DIAGNOSIS — E119 Type 2 diabetes mellitus without complications: Secondary | ICD-10-CM

## 2014-06-16 DIAGNOSIS — M109 Gout, unspecified: Secondary | ICD-10-CM

## 2014-06-16 DIAGNOSIS — J45909 Unspecified asthma, uncomplicated: Secondary | ICD-10-CM

## 2014-06-16 DIAGNOSIS — E559 Vitamin D deficiency, unspecified: Secondary | ICD-10-CM

## 2014-06-16 DIAGNOSIS — F411 Generalized anxiety disorder: Secondary | ICD-10-CM

## 2014-06-16 DIAGNOSIS — E039 Hypothyroidism, unspecified: Secondary | ICD-10-CM

## 2014-06-16 DIAGNOSIS — F419 Anxiety disorder, unspecified: Secondary | ICD-10-CM

## 2014-06-16 DIAGNOSIS — E785 Hyperlipidemia, unspecified: Secondary | ICD-10-CM

## 2014-06-16 LAB — POCT GLYCOSYLATED HEMOGLOBIN (HGB A1C): Hemoglobin A1C: 8.2

## 2014-06-16 LAB — POCT UA - MICROALBUMIN: Microalbumin Ur, POC: 100 mg/L

## 2014-06-16 MED ORDER — IPRATROPIUM-ALBUTEROL 18-103 MCG/ACT IN AERO: 2.0000 | INHALATION_SPRAY | RESPIRATORY_TRACT | Status: DC | PRN

## 2014-06-16 MED ORDER — BUDESONIDE-FORMOTEROL FUMARATE 160-4.5 MCG/ACT IN AERO: 2.0000 | INHALATION_SPRAY | Freq: Two times a day (BID) | RESPIRATORY_TRACT | Status: DC

## 2014-06-16 MED ORDER — METOPROLOL SUCCINATE ER 25 MG PO TB24: 25.0000 mg | ORAL_TABLET | Freq: Every day | ORAL | Status: DC

## 2014-06-16 NOTE — Addendum Note (Signed)
Addended by: Earlene Plater on: 06/16/2014 09:59 AM   Modules accepted: Orders

## 2014-06-16 NOTE — Patient Instructions (Signed)

## 2014-06-16 NOTE — Progress Notes (Signed)
Subjective:    Patient ID: Mary Raymond, female    DOB: [birth year], 63 y.o.   MRN: [id number]  Diabetes She presents for her follow-up diabetic visit. She has type 2 diabetes mellitus. Hypoglycemia symptoms include nervousness/anxiousness. Pertinent negatives for hypoglycemia include no confusion, dizziness, headaches or mood changes. Associated symptoms include fatigue. Pertinent negatives for diabetes include no blurred vision, no chest pain, no foot ulcerations, no visual change and no weight loss. Symptoms are stable. Diabetic complications include peripheral neuropathy. Pertinent negatives for diabetic complications include no CVA, heart disease or nephropathy. Risk factors for coronary artery disease include diabetes mellitus, dyslipidemia, hypertension, obesity, post-menopausal, sedentary lifestyle and family history. Current diabetic treatment includes insulin injections. She is compliant with treatment all of the time. She is following a generally unhealthy diet. She rarely participates in exercise. Her breakfast blood glucose range is generally >200 mg/dl. An ACE inhibitor/angiotensin II receptor blocker is being taken. Eye exam is not current.  Hypertension This is a chronic problem. The current episode started more than 1 year ago. The problem has been waxing and waning since onset. The problem is uncontrolled. Associated symptoms include anxiety, palpitations and shortness of breath. Pertinent negatives include no blurred vision, chest pain, headaches or peripheral edema. Risk factors for coronary artery disease include diabetes mellitus, dyslipidemia, family history, obesity and post-menopausal state. Past treatments include calcium channel blockers and angiotensin blockers. The current treatment provides mild improvement. Hypertensive end-organ damage includes a thyroid problem. There is no history of kidney disease, CAD/MI, CVA or heart failure. There is no history of sleep apnea.    Hyperlipidemia This is a chronic problem. The current episode started more than 1 year ago. The problem is uncontrolled. Recent lipid tests were reviewed and are high. Exacerbating diseases include diabetes and hypothyroidism. Factors aggravating her hyperlipidemia include fatty foods. Associated symptoms include shortness of breath. Pertinent negatives include no chest pain, leg pain or myalgias. Current antihyperlipidemic treatment includes statins. The current treatment provides mild improvement of lipids. Risk factors for coronary artery disease include diabetes mellitus, dyslipidemia, family history, hypertension, obesity, a sedentary lifestyle and post-menopausal.  Anxiety Presents for follow-up visit. Symptoms include depressed mood, nervous/anxious behavior, palpitations and shortness of breath. Patient reports no chest pain, confusion, dizziness, excessive worry, insomnia, irritability or restlessness. Symptoms occur rarely.   Risk factors include marital problems. Her past medical history is significant for anxiety/panic attacks, asthma and depression. There is no history of CAD. Past treatments include SSRIs. The treatment provided mild relief.  Gastrophageal Reflux She complains of heartburn. She reports no chest pain, no coughing, no sore throat, no water brash or no wheezing. The current episode started more than 1 year ago. The problem occurs frequently. The problem has been waxing and waning. The heartburn wakes her from sleep. The heartburn does not limit her activity. The symptoms are aggravated by certain foods and lying down. Associated symptoms include fatigue and muscle weakness. Pertinent negatives include no weight loss. She has tried a PPI for the symptoms. The treatment provided mild relief.  Asthma She complains of frequent throat clearing and shortness of breath. There is no cough or wheezing. This is a chronic problem. The current episode started more than 1 year ago. The  problem occurs intermittently. The problem has been waxing and waning. Associated symptoms include heartburn. Pertinent negatives include no chest pain, ear pain, headaches, myalgias, sore throat or weight loss. Her symptoms are aggravated by strenuous activity. Her symptoms are alleviated by beta-agonist. She  reports moderate improvement on treatment. Her past medical history is significant for asthma. There is no history of COPD.  Thyroid Problem Presents for follow-up visit. Symptoms include anxiety, depressed mood, diarrhea, fatigue and palpitations. Patient reports no dry skin, leg swelling, visual change or weight loss. The symptoms have been worsening. Past treatments include levothyroxine. The treatment provided moderate relief. Her past medical history is significant for diabetes and hyperlipidemia. There is no history of heart failure.      Review of Systems  Constitutional: Positive for fatigue. Negative for weight loss and irritability.  HENT: Negative.  Negative for ear pain and sore throat.   Eyes: Negative.  Negative for blurred vision.  Respiratory: Positive for shortness of breath. Negative for cough and wheezing.   Cardiovascular: Positive for palpitations. Negative for chest pain.  Gastrointestinal: Positive for heartburn and diarrhea.  Endocrine: Negative.   Genitourinary: Negative.   Musculoskeletal: Positive for muscle weakness. Negative for myalgias.  Neurological: Negative.  Negative for dizziness and headaches.  Hematological: Negative.   Psychiatric/Behavioral: Negative for confusion. The patient is nervous/anxious. The patient does not have insomnia.   All other systems reviewed and are negative.      Objective:   Physical Exam  Vitals reviewed. Constitutional: She is oriented to person, place, and time. She appears well-developed and well-nourished. No distress.  HENT:  Head: Normocephalic and atraumatic.  Right Ear: External ear normal.  Left Ear: External  ear normal.  Nose: Nose normal.  Mouth/Throat: Oropharynx is clear and moist.  Eyes: Pupils are equal, round, and reactive to light.  Neck: Normal range of motion. Neck supple. No thyromegaly present.  Cardiovascular: Normal rate, regular rhythm, normal heart sounds and intact distal pulses.   No murmur heard. Pulmonary/Chest: Effort normal and breath sounds normal. No respiratory distress. She has no wheezes.  Abdominal: Soft. Bowel sounds are normal. She exhibits no distension. There is no tenderness.  Musculoskeletal: Normal range of motion. She exhibits no edema and no tenderness.  Neurological: She is alert and oriented to person, place, and time. She has normal reflexes. No cranial nerve deficit.  Skin: Skin is warm and dry.  Psychiatric: She has a normal mood and affect. Her behavior is normal. Judgment and thought content normal.    BP 144/69  Pulse 97  Temp(Src) 97.2 F (36.2 C) (Oral)  Ht $R'5\' 5"'bd$  (1.651 m)  Wt 281 lb 12.8 oz (127.824 kg)  BMI 46.89 kg/m2       Assessment & Plan:  1. Essential hypertension - CMP14+EGFR - metoprolol succinate (TOPROL-XL) 25 MG 24 hr tablet; Take 1 tablet (25 mg total) by mouth daily.  Dispense: 90 tablet; Refill: 3  2. Asthma, unspecified asthma severity, uncomplicated -Pt started on Symbicort 160-4.5 mcg/act today  CMP14+EGFR - albuterol-ipratropium (COMBIVENT) 18-103 MCG/ACT inhaler; Inhale 2 puffs into the lungs as needed for wheezing or shortness of breath.  Dispense: 1 Inhaler; Refill: 5  3. Unspecified hypothyroidism - CMP14+EGFR - Thyroid Panel With TSH  4. Diabetes mellitus without complication - POCT glycosylated hemoglobin (Hb A1C) - CMP14+EGFR - POCT UA - Microalbumin  5. Vitamin D deficiency - CMP14+EGFR - Vit D  25 hydroxy (rtn osteoporosis monitoring)  6. Hyperlipidemia - CMP14+EGFR - Lipid panel  7. Gout without tophus, unspecified cause, unspecified chronicity, unspecified site  8. Anxiety -  CMP14+EGFR   Continue all meds Labs pending Health Maintenance reviewed Diet and exercise encouraged RTO 3 month follow-up- Pt will need an appt with Tammy for uncontrolled diabetes  Evelina Dun, FNP

## 2014-06-17 ENCOUNTER — Ambulatory Visit: Payer: Medicare PPO

## 2014-06-17 LAB — CMP14+EGFR
ALBUMIN: 4.2 g/dL (ref 3.6–4.8)
ALT: 32 IU/L (ref 0–32)
AST: 39 IU/L (ref 0–40)
Albumin/Globulin Ratio: 1.5 (ref 1.1–2.5)
Alkaline Phosphatase: 82 IU/L (ref 39–117)
BILIRUBIN TOTAL: 0.3 mg/dL (ref 0.0–1.2)
BUN / CREAT RATIO: 20 (ref 11–26)
BUN: 18 mg/dL (ref 8–27)
CO2: 27 mmol/L (ref 18–29)
Calcium: 10 mg/dL (ref 8.7–10.3)
Chloride: 99 mmol/L (ref 97–108)
Creatinine, Ser: 0.88 mg/dL (ref 0.57–1.00)
GFR calc non Af Amer: 71 mL/min/{1.73_m2} (ref >59)
GFR, EST AFRICAN AMERICAN: 81 mL/min/{1.73_m2} (ref >59)
Globulin, Total: 2.8 g/dL (ref 1.5–4.5)
Glucose: 207 mg/dL — ABNORMAL HIGH (ref 65–99)
POTASSIUM: 4.5 mmol/L (ref 3.5–5.2)
SODIUM: 142 mmol/L (ref 134–144)
Total Protein: 7 g/dL (ref 6.0–8.5)

## 2014-06-17 LAB — VITAMIN D 25 HYDROXY (VIT D DEFICIENCY, FRACTURES): Vit D, 25-Hydroxy: 15.3 ng/mL — ABNORMAL LOW (ref 30.0–100.0)

## 2014-06-17 LAB — MICROALBUMIN, URINE: MICROALBUM., U, RANDOM: 576.1 ug/mL — AB (ref 0.0–17.0)

## 2014-06-17 LAB — LIPID PANEL
CHOL/HDL RATIO: 6.4 ratio — AB (ref 0.0–4.4)
Cholesterol, Total: 261 mg/dL — ABNORMAL HIGH (ref 100–199)
HDL: 41 mg/dL (ref >39)
LDL CALC: 160 mg/dL — AB (ref 0–99)
TRIGLYCERIDES: 299 mg/dL — AB (ref 0–149)
VLDL Cholesterol Cal: 60 mg/dL — ABNORMAL HIGH (ref 5–40)

## 2014-06-17 LAB — THYROID PANEL WITH TSH
Free Thyroxine Index: 2.8 (ref 1.2–4.9)
T3 Uptake Ratio: 31 % (ref 24–39)
T4, Total: 9.1 ug/dL (ref 4.5–12.0)
TSH: 2.35 u[IU]/mL (ref 0.450–4.500)

## 2014-06-18 ENCOUNTER — Other Ambulatory Visit: Payer: Self-pay | Admitting: Family

## 2014-06-18 MED ORDER — VITAMIN D (ERGOCALCIFEROL) 1.25 MG (50000 UNIT) PO CAPS: 50000.0000 [IU] | ORAL_CAPSULE | ORAL | Status: DC

## 2014-06-18 MED ORDER — ATORVASTATIN CALCIUM 40 MG PO TABS: 40.0000 mg | ORAL_TABLET | Freq: Every day | ORAL | Status: DC

## 2014-06-18 MED ORDER — METFORMIN HCL 500 MG PO TABS: 500.0000 mg | ORAL_TABLET | Freq: Two times a day (BID) | ORAL | Status: DC

## 2014-06-21 ENCOUNTER — Ambulatory Visit (INDEPENDENT_AMBULATORY_CARE_PROVIDER_SITE_OTHER): Payer: Medicare PPO

## 2014-06-21 DIAGNOSIS — Z23 Encounter for immunization: Secondary | ICD-10-CM

## 2014-06-25 ENCOUNTER — Other Ambulatory Visit: Payer: Self-pay | Admitting: Family Medicine

## 2014-06-27 ENCOUNTER — Other Ambulatory Visit: Payer: Self-pay | Admitting: Family Medicine

## 2014-06-29 ENCOUNTER — Other Ambulatory Visit: Payer: Self-pay | Admitting: Nurse Practitioner

## 2014-07-06 ENCOUNTER — Telehealth: Payer: Self-pay | Admitting: Nurse Practitioner

## 2014-07-06 ENCOUNTER — Other Ambulatory Visit: Payer: Self-pay | Admitting: Nurse Practitioner

## 2014-07-06 ENCOUNTER — Other Ambulatory Visit (INDEPENDENT_AMBULATORY_CARE_PROVIDER_SITE_OTHER): Payer: Medicare PPO

## 2014-07-06 DIAGNOSIS — R300 Dysuria: Secondary | ICD-10-CM

## 2014-07-06 DIAGNOSIS — R319 Hematuria, unspecified: Secondary | ICD-10-CM

## 2014-07-06 DIAGNOSIS — N390 Urinary tract infection, site not specified: Secondary | ICD-10-CM

## 2014-07-06 LAB — POCT UA - MICROSCOPIC ONLY
Casts, Ur, LPF, POC: NEGATIVE
Crystals, Ur, HPF, POC: NEGATIVE
Yeast, UA: NEGATIVE

## 2014-07-06 LAB — POCT URINALYSIS DIPSTICK
Bilirubin, UA: NEGATIVE
Blood, UA: NEGATIVE
GLUCOSE UA: NEGATIVE
KETONES UA: NEGATIVE
LEUKOCYTES UA: NEGATIVE
Nitrite, UA: NEGATIVE
Spec Grav, UA: 1.01
Urobilinogen, UA: NEGATIVE
pH, UA: 5

## 2014-07-06 NOTE — Telephone Encounter (Signed)
Patient aware to leave urine order in epic

## 2014-07-06 NOTE — Telephone Encounter (Signed)
Need to come in for urinalysis

## 2014-07-07 ENCOUNTER — Other Ambulatory Visit (INDEPENDENT_AMBULATORY_CARE_PROVIDER_SITE_OTHER): Payer: Medicare PPO

## 2014-07-07 ENCOUNTER — Other Ambulatory Visit: Payer: Self-pay | Admitting: Nurse Practitioner

## 2014-07-07 ENCOUNTER — Telehealth: Payer: Self-pay | Admitting: *Deleted

## 2014-07-07 DIAGNOSIS — B3749 Other urogenital candidiasis: Secondary | ICD-10-CM

## 2014-07-07 LAB — POCT UA - MICROSCOPIC ONLY
BACTERIA, U MICROSCOPIC: NEGATIVE
CASTS, UR, LPF, POC: NEGATIVE
Crystals, Ur, HPF, POC: NEGATIVE
Yeast, UA: NEGATIVE

## 2014-07-07 LAB — POCT URINALYSIS DIPSTICK

## 2014-07-07 MED ORDER — CIPROFLOXACIN HCL 500 MG PO TABS: 500.0000 mg | ORAL_TABLET | Freq: Two times a day (BID) | ORAL | Status: DC

## 2014-07-07 NOTE — Telephone Encounter (Signed)
Message copied by Shelbie Ammons on Wed Jul 07, 2014  9:23 AM ------      Message from: Chevis Pretty      Created: Wed Jul 07, 2014  9:00 AM       Urine is completely negative of infection ------

## 2014-07-07 NOTE — Telephone Encounter (Signed)
Patient aware.

## 2014-07-07 NOTE — Telephone Encounter (Signed)
Patient left urine yesterday and wants to know about results.  She feels like she is sitting on bladder.

## 2014-07-07 NOTE — Progress Notes (Signed)
Lab only 

## 2014-07-07 NOTE — Telephone Encounter (Signed)
Come back and bring another urine

## 2014-07-07 NOTE — Telephone Encounter (Signed)
Patient says she is in bad pain.  Burning bad with voiding and going very frequently.

## 2014-07-29 ENCOUNTER — Other Ambulatory Visit: Payer: Self-pay | Admitting: Family Medicine

## 2014-08-18 ENCOUNTER — Other Ambulatory Visit: Payer: Self-pay | Admitting: Physician Assistant

## 2014-08-19 ENCOUNTER — Telehealth: Payer: Self-pay | Admitting: *Deleted

## 2014-08-19 NOTE — Telephone Encounter (Signed)
This is okay to refill 

## 2014-08-19 NOTE — Telephone Encounter (Signed)
Received fax from [facility] requesting a refill on Allopurinol 300mg . Take 1 tablet every day. Has not been filled on quite some time. Did find med in history. Please advise and refill if OK. Patient of MMM

## 2014-08-20 ENCOUNTER — Other Ambulatory Visit: Payer: Self-pay | Admitting: *Deleted

## 2014-08-20 ENCOUNTER — Other Ambulatory Visit: Payer: Self-pay | Admitting: Family

## 2014-08-20 MED ORDER — ALLOPURINOL 300 MG PO TABS: 300.0000 mg | ORAL_TABLET | Freq: Every day | ORAL | Status: DC

## 2014-08-20 NOTE — Telephone Encounter (Signed)
Sent in to pharmacy.  

## 2014-08-29 ENCOUNTER — Other Ambulatory Visit: Payer: Self-pay | Admitting: Physician Assistant

## 2014-09-02 ENCOUNTER — Other Ambulatory Visit: Payer: Self-pay | Admitting: Nurse Practitioner

## 2014-09-14 ENCOUNTER — Other Ambulatory Visit: Payer: Self-pay | Admitting: Family Medicine

## 2014-09-14 NOTE — Telephone Encounter (Signed)
Patient has follow up appointment with Evelina Dun 10/18/14.

## 2014-09-16 ENCOUNTER — Other Ambulatory Visit: Payer: Self-pay | Admitting: Family Medicine

## 2014-10-04 ENCOUNTER — Other Ambulatory Visit: Payer: Self-pay | Admitting: Family Medicine

## 2014-10-10 ENCOUNTER — Other Ambulatory Visit: Payer: Self-pay | Admitting: Family

## 2014-10-18 ENCOUNTER — Ambulatory Visit: Payer: Medicare PPO | Admitting: Family

## 2014-10-27 ENCOUNTER — Other Ambulatory Visit: Payer: Self-pay | Admitting: Family

## 2014-11-02 ENCOUNTER — Ambulatory Visit: Payer: Medicare PPO | Admitting: Family

## 2014-11-02 ENCOUNTER — Other Ambulatory Visit: Payer: Self-pay | Admitting: *Deleted

## 2014-11-02 MED ORDER — BUSPIRONE HCL 15 MG PO TABS: 7.5000 mg | ORAL_TABLET | Freq: Two times a day (BID) | ORAL | Status: DC

## 2014-11-15 ENCOUNTER — Encounter: Payer: Self-pay | Admitting: Family

## 2014-11-15 ENCOUNTER — Ambulatory Visit (INDEPENDENT_AMBULATORY_CARE_PROVIDER_SITE_OTHER): Payer: Medicare PPO | Admitting: Family

## 2014-11-15 VITALS — BP 155/76 | HR 101 | Temp 97.0°F | Ht 65.0 in | Wt 288.8 lb

## 2014-11-15 DIAGNOSIS — K219 Gastro-esophageal reflux disease without esophagitis: Secondary | ICD-10-CM | POA: Diagnosis not present

## 2014-11-15 DIAGNOSIS — E1165 Type 2 diabetes mellitus with hyperglycemia: Secondary | ICD-10-CM | POA: Diagnosis not present

## 2014-11-15 DIAGNOSIS — E785 Hyperlipidemia, unspecified: Secondary | ICD-10-CM

## 2014-11-15 DIAGNOSIS — I10 Essential (primary) hypertension: Secondary | ICD-10-CM | POA: Diagnosis not present

## 2014-11-15 DIAGNOSIS — G629 Polyneuropathy, unspecified: Secondary | ICD-10-CM

## 2014-11-15 DIAGNOSIS — F329 Major depressive disorder, single episode, unspecified: Secondary | ICD-10-CM

## 2014-11-15 DIAGNOSIS — E559 Vitamin D deficiency, unspecified: Secondary | ICD-10-CM

## 2014-11-15 DIAGNOSIS — E669 Obesity, unspecified: Secondary | ICD-10-CM | POA: Diagnosis not present

## 2014-11-15 DIAGNOSIS — F419 Anxiety disorder, unspecified: Secondary | ICD-10-CM | POA: Diagnosis not present

## 2014-11-15 DIAGNOSIS — J45909 Unspecified asthma, uncomplicated: Secondary | ICD-10-CM | POA: Diagnosis not present

## 2014-11-15 DIAGNOSIS — E039 Hypothyroidism, unspecified: Secondary | ICD-10-CM | POA: Diagnosis not present

## 2014-11-15 DIAGNOSIS — M109 Gout, unspecified: Secondary | ICD-10-CM | POA: Diagnosis not present

## 2014-11-15 DIAGNOSIS — J019 Acute sinusitis, unspecified: Secondary | ICD-10-CM

## 2014-11-15 DIAGNOSIS — IMO0001 Reserved for inherently not codable concepts without codable children: Secondary | ICD-10-CM

## 2014-11-15 DIAGNOSIS — F32A Depression, unspecified: Secondary | ICD-10-CM | POA: Insufficient documentation

## 2014-11-15 LAB — POCT UA - MICROALBUMIN: Microalbumin Ur, POC: 100 mg/L

## 2014-11-15 LAB — POCT GLYCOSYLATED HEMOGLOBIN (HGB A1C): Hemoglobin A1C: 8.4

## 2014-11-15 MED ORDER — AMOXICILLIN-POT CLAVULANATE 875-125 MG PO TABS: 1.0000 | ORAL_TABLET | Freq: Two times a day (BID) | ORAL | Status: DC

## 2014-11-15 MED ORDER — ALLOPURINOL 300 MG PO TABS: 300.0000 mg | ORAL_TABLET | Freq: Every day | ORAL | Status: DC

## 2014-11-15 MED ORDER — TRAMADOL HCL 50 MG PO TABS: 50.0000 mg | ORAL_TABLET | Freq: Three times a day (TID) | ORAL | Status: DC | PRN

## 2014-11-15 MED ORDER — INSULIN GLARGINE 100 UNIT/ML SOLOSTAR PEN: PEN_INJECTOR | SUBCUTANEOUS | Status: DC

## 2014-11-15 MED ORDER — INSULIN LISPRO 100 UNIT/ML ~~LOC~~ SOLN: SUBCUTANEOUS | Status: DC

## 2014-11-15 MED ORDER — METFORMIN HCL 500 MG PO TABS: 1000.0000 mg | ORAL_TABLET | Freq: Two times a day (BID) | ORAL | Status: DC

## 2014-11-15 MED ORDER — LEVOTHYROXINE SODIUM 50 MCG PO TABS: ORAL_TABLET | ORAL | Status: DC

## 2014-11-15 MED ORDER — ALPRAZOLAM 0.5 MG PO TABS: 0.5000 mg | ORAL_TABLET | Freq: Two times a day (BID) | ORAL | Status: DC | PRN

## 2014-11-15 NOTE — Progress Notes (Signed)
Subjective:    Patient ID: Mary Raymond, female    DOB: [birth year], 64 y.o.   MRN: [id number]  Diabetes She presents for her follow-up diabetic visit. She has type 2 diabetes mellitus. Hypoglycemia symptoms include nervousness/anxiousness. Pertinent negatives for hypoglycemia include no confusion, dizziness, headaches or mood changes. Associated symptoms include fatigue. Pertinent negatives for diabetes include no blurred vision, no chest pain, no foot paresthesias, no foot ulcerations, no visual change and no weight loss. Symptoms are stable. Diabetic complications include peripheral neuropathy. Pertinent negatives for diabetic complications include no CVA, heart disease or nephropathy. Risk factors for coronary artery disease include diabetes mellitus, dyslipidemia, hypertension, obesity, post-menopausal, sedentary lifestyle and family history. Current diabetic treatment includes insulin injections and oral agent (monotherapy). She is compliant with treatment all of the time. She is following a generally unhealthy diet. She rarely participates in exercise. Her breakfast blood glucose range is generally >200 mg/dl. An ACE inhibitor/angiotensin II receptor blocker is being taken. Eye exam is not current.  Hypertension This is a chronic problem. The current episode started more than 1 year ago. The problem has been waxing and waning since onset. The problem is uncontrolled. Associated symptoms include anxiety, palpitations, peripheral edema (At times) and shortness of breath. Pertinent negatives include no blurred vision, chest pain or headaches. Risk factors for coronary artery disease include diabetes mellitus, dyslipidemia, family history, obesity and post-menopausal state. Past treatments include calcium channel blockers and angiotensin blockers. The current treatment provides mild improvement. Hypertensive end-organ damage includes a thyroid problem. There is no history of kidney disease, CAD/MI,  CVA or heart failure. There is no history of sleep apnea.  Hyperlipidemia This is a chronic problem. The current episode started more than 1 year ago. The problem is uncontrolled. Recent lipid tests were reviewed and are high. Exacerbating diseases include diabetes and hypothyroidism. Factors aggravating her hyperlipidemia include smoking. Associated symptoms include leg pain and shortness of breath. Pertinent negatives include no chest pain or myalgias. Current antihyperlipidemic treatment includes statins. The current treatment provides moderate improvement of lipids. Risk factors for coronary artery disease include diabetes mellitus, dyslipidemia, hypertension, obesity, post-menopausal and a sedentary lifestyle.  Anxiety Presents for follow-up visit. Symptoms include depressed mood, excessive worry, irritability, nervous/anxious behavior, palpitations and shortness of breath. Patient reports no chest pain, confusion or dizziness. Symptoms occur most days. The severity of symptoms is moderate.   Her past medical history is significant for anxiety/panic attacks, asthma and depression. Past treatments include SSRIs. The treatment provided mild relief. Compliance with prior treatments has been good.  Gastrophageal Reflux She complains of coughing, heartburn, a hoarse voice and wheezing. She reports no chest pain or no sore throat. This is a chronic problem. The current episode started more than 1 year ago. The problem occurs rarely. The problem has been waxing and waning. Associated symptoms include fatigue. Pertinent negatives include no weight loss. She has tried a PPI for the symptoms. The treatment provided mild relief.  Asthma She complains of cough, frequent throat clearing, hoarse voice, shortness of breath and wheezing. This is a chronic problem. The current episode started more than 1 year ago. The problem occurs intermittently. The problem has been unchanged. The cough is non-productive.  Associated symptoms include ear congestion, ear pain, heartburn, nasal congestion and rhinorrhea. Pertinent negatives include no chest pain, headaches, myalgias, sore throat or weight loss. Her past medical history is significant for asthma.  Thyroid Problem Symptoms include anxiety, depressed mood, diarrhea, dry skin, fatigue, hoarse voice and  palpitations. Patient reports no constipation, visual change or weight loss. The symptoms have been worsening. Past treatments include levothyroxine. The treatment provided moderate relief. Her past medical history is significant for diabetes and hyperlipidemia. There is no history of heart failure.      Review of Systems  Constitutional: Positive for irritability and fatigue. Negative for weight loss.  HENT: Positive for ear pain, hoarse voice and rhinorrhea. Negative for sore throat.   Eyes: Negative.  Negative for blurred vision.  Respiratory: Positive for cough, shortness of breath and wheezing.   Cardiovascular: Positive for palpitations. Negative for chest pain.  Gastrointestinal: Positive for heartburn and diarrhea. Negative for constipation.  Endocrine: Negative.   Genitourinary: Negative.   Musculoskeletal: Negative.  Negative for myalgias.  Neurological: Negative.  Negative for dizziness and headaches.  Hematological: Negative.   Psychiatric/Behavioral: Negative for confusion. The patient is nervous/anxious.   All other systems reviewed and are negative.      Objective:   Physical Exam  Constitutional: She is oriented to person, place, and time. She appears well-developed and well-nourished. No distress.  HENT:  Head: Normocephalic and atraumatic.  Right Ear: External ear normal.  Left Ear: External ear normal.  Nose: Right sinus exhibits maxillary sinus tenderness and frontal sinus tenderness. Left sinus exhibits maxillary sinus tenderness and frontal sinus tenderness.  Nasal passage erythemas with mild swelling  Oropharynx  erythemas   Eyes: Pupils are equal, round, and reactive to light.  Neck: Normal range of motion. Neck supple. No thyromegaly present.  Cardiovascular: Normal rate, regular rhythm, normal heart sounds and intact distal pulses.   No murmur heard. Pulmonary/Chest: Effort normal and breath sounds normal. No respiratory distress. She has no wheezes.  Abdominal: Soft. Bowel sounds are normal. She exhibits no distension. There is no tenderness.  Musculoskeletal: Normal range of motion. She exhibits no edema or tenderness.  Neurological: She is alert and oriented to person, place, and time. She has normal reflexes. No cranial nerve deficit.  Skin: Skin is warm and dry.  Psychiatric: She has a normal mood and affect. Her behavior is normal. Judgment and thought content normal.  Vitals reviewed.     BP 155/76 mmHg  Pulse 101  Temp(Src) 97 F (36.1 C) (Oral)  Ht 5' 5" (1.651 m)  Wt 288 lb 12.8 oz (130.999 kg)  BMI 48.06 kg/m2     Assessment & Plan:  1. Essential hypertension - CMP14+EGFR  2. Asthma, unspecified asthma severity, uncomplicated - YOV78+HYIF  3. Hypothyroidism, unspecified hypothyroidism type - CMP14+EGFR - Thyroid Panel With TSH - levothyroxine (SYNTHROID, LEVOTHROID) 50 MCG tablet; TAKE 1 TABLET EVERY DAY BEFORE BREAKFAST  Dispense: 90 tablet; Refill: 4  4. Diabetes mellitus type 2, uncontrolled, without complications - POCT glycosylated hemoglobin (Hb A1C) - POCT UA - Microalbumin - CMP14+EGFR - Insulin Glargine (LANTUS SOLOSTAR) 100 UNIT/ML Solostar Pen; INJECT UP TO 55 UNITS UNDER THE SKIN TWICE A DAY AS INSTRUCTED  Dispense: 33 mL; Refill: 11 - insulin lispro (HUMALOG) 100 UNIT/ML injection; INJECT 45 UNITS INTO THE SKIN THREE TIMES A DAY AS INSTRUCTED  Dispense: 40 mL; Refill: 11 - metFORMIN (GLUCOPHAGE) 500 MG tablet; Take 2 tablets (1,000 mg total) by mouth 2 (two) times daily with a meal.  Dispense: 240 tablet; Refill: 3  5. Neuropathy - CMP14+EGFR -  traMADol (ULTRAM) 50 MG tablet; Take 1 tablet (50 mg total) by mouth every 8 (eight) hours as needed.  Dispense: 60 tablet; Refill: 0  6. Vitamin D deficiency - CMP14+EGFR - Vit  D  25 hydroxy (rtn osteoporosis monitoring)  7. Hyperlipidemia - CMP14+EGFR - Lipid panel  8. Obesity - CMP14+EGFR  9. Gout without tophus, unspecified cause, unspecified chronicity, unspecified site  - CMP14+EGFR - allopurinol (ZYLOPRIM) 300 MG tablet; Take 1 tablet (300 mg total) by mouth daily.  Dispense: 90 tablet; Refill: 3  10. Anxiety - CMP14+EGFR - ALPRAZolam (XANAX) 0.5 MG tablet; Take 1 tablet (0.5 mg total) by mouth 2 (two) times daily as needed for anxiety.  Dispense: 60 tablet; Refill: 2  11. Depression - CMP14+EGFR - ALPRAZolam (XANAX) 0.5 MG tablet; Take 1 tablet (0.5 mg total) by mouth 2 (two) times daily as needed for anxiety.  Dispense: 60 tablet; Refill: 2  12. Gastroesophageal reflux disease, esophagitis presence not specified - CMP14+EGFR  13. Acute sinusitis, recurrence not specified, unspecified location - amoxicillin-clavulanate (AUGMENTIN) 875-125 MG per tablet; Take 1 tablet by mouth 2 (two) times daily.  Dispense: 14 tablet; Refill: 0   Continue all meds Labs pending Health Maintenance reviewed Diet and exercise encouraged RTO 3 months  Evelina Dun, FNP

## 2014-11-15 NOTE — Addendum Note (Signed)
Addended by: Wyline Mood on: 11/15/2014 09:57 AM   Modules accepted: Orders

## 2014-11-15 NOTE — Patient Instructions (Addendum)
Diabetes Mellitus and Food It is important for you to manage your blood sugar (glucose) level. Your blood glucose level can be greatly affected by what you eat. Eating healthier foods in the appropriate amounts throughout the day at about the same time each day will help you control your blood glucose level. It can also help slow or prevent worsening of your diabetes mellitus. Healthy eating may even help you improve the level of your blood pressure and reach or maintain a healthy weight.  HOW CAN FOOD AFFECT ME? Carbohydrates Carbohydrates affect your blood glucose level more than any other type of food. Your dietitian will help you determine how many carbohydrates to eat at each meal and teach you how to count carbohydrates. Counting carbohydrates is important to keep your blood glucose at a healthy level, especially if you are using insulin or taking certain medicines for diabetes mellitus. Alcohol Alcohol can cause sudden decreases in blood glucose (hypoglycemia), especially if you use insulin or take certain medicines for diabetes mellitus. Hypoglycemia can be a life-threatening condition. Symptoms of hypoglycemia (sleepiness, dizziness, and disorientation) are similar to symptoms of having too much alcohol.  If your health care provider has given you approval to drink alcohol, do so in moderation and use the following guidelines:  Women should not have more than one drink per day, and men should not have more than two drinks per day. One drink is equal to:  12 oz of beer.  5 oz of wine.  1 oz of hard liquor.  Do not drink on an empty stomach.  Keep yourself hydrated. Have water, diet soda, or unsweetened iced tea.  Regular soda, juice, and other mixers might contain a lot of carbohydrates and should be counted. WHAT FOODS ARE NOT RECOMMENDED? As you make food choices, it is important to remember that all foods are not the same. Some foods have fewer nutrients per serving than other  foods, even though they might have the same number of calories or carbohydrates. It is difficult to get your body what it needs when you eat foods with fewer nutrients. Examples of foods that you should avoid that are high in calories and carbohydrates but low in nutrients include:  Trans fats (most processed foods list trans fats on the Nutrition Facts label).  Regular soda.  Juice.  Candy.  Sweets, such as cake, pie, doughnuts, and cookies.  Fried foods. WHAT FOODS CAN I EAT? Have nutrient-rich foods, which will nourish your body and keep you healthy. The food you should eat also will depend on several factors, including:  The calories you need.  The medicines you take.  Your weight.  Your blood glucose level.  Your blood pressure level.  Your cholesterol level. You also should eat a variety of foods, including:  Protein, such as meat, poultry, fish, tofu, nuts, and seeds (lean animal proteins are best).  Fruits.  Vegetables.  Dairy products, such as milk, cheese, and yogurt (low fat is best).  Breads, grains, pasta, cereal, rice, and beans.  Fats such as olive oil, trans fat-free margarine, canola oil, avocado, and olives. DOES EVERYONE WITH DIABETES MELLITUS HAVE THE SAME MEAL PLAN? Because every person with diabetes mellitus is different, there is not one meal plan that works for everyone. It is very important that you meet with a dietitian who will help you create a meal plan that is just right for you. Document Released: 05/31/2005 Document Revised: 09/08/2013 Document Reviewed: 07/31/2013 [employer] Patient Information 2015 [employer]. This   information is not intended to replace advice given to you by your health care provider. Make sure you discuss any questions you have with your health care provider. Sinusitis Sinusitis is redness, soreness, and inflammation of the paranasal sinuses. Paranasal sinuses are air pockets within the bones of your face (beneath  the eyes, the middle of the forehead, or above the eyes). In healthy paranasal sinuses, mucus is able to drain out, and air is able to circulate through them by way of your nose. However, when your paranasal sinuses are inflamed, mucus and air can become trapped. This can allow bacteria and other germs to grow and cause infection. Sinusitis can develop quickly and last only a short time (acute) or continue over a long period (chronic). Sinusitis that lasts for more than 12 weeks is considered chronic.  CAUSES  Causes of sinusitis include:  Allergies.  Structural abnormalities, such as displacement of the cartilage that separates your nostrils (deviated septum), which can decrease the air flow through your nose and sinuses and affect sinus drainage.  Functional abnormalities, such as when the small hairs (cilia) that line your sinuses and help remove mucus do not work properly or are not present. SIGNS AND SYMPTOMS  Symptoms of acute and chronic sinusitis are the same. The primary symptoms are pain and pressure around the affected sinuses. Other symptoms include:  Upper toothache.  Earache.  Headache.  Bad breath.  Decreased sense of smell and taste.  A cough, which worsens when you are lying flat.  Fatigue.  Fever.  Thick drainage from your nose, which often is green and may contain pus (purulent).  Swelling and warmth over the affected sinuses. DIAGNOSIS  Your health care provider will perform a physical exam. During the exam, your health care provider may:  Look in your nose for signs of abnormal growths in your nostrils (nasal polyps).  Tap over the affected sinus to check for signs of infection.  View the inside of your sinuses (endoscopy) using an imaging device that has a light attached (endoscope). If your health care provider suspects that you have chronic sinusitis, one or more of the following tests may be recommended:  Allergy tests.  Nasal culture. A sample of  mucus is taken from your nose, sent to a lab, and screened for bacteria.  Nasal cytology. A sample of mucus is taken from your nose and examined by your health care provider to determine if your sinusitis is related to an allergy. TREATMENT  Most cases of acute sinusitis are related to a viral infection and will resolve on their own within 10 days. Sometimes medicines are prescribed to help relieve symptoms (pain medicine, decongestants, nasal steroid sprays, or saline sprays).  However, for sinusitis related to a bacterial infection, your health care provider will prescribe antibiotic medicines. These are medicines that will help kill the bacteria causing the infection.  Rarely, sinusitis is caused by a fungal infection. In theses cases, your health care provider will prescribe antifungal medicine. For some cases of chronic sinusitis, surgery is needed. Generally, these are cases in which sinusitis recurs more than 3 times per year, despite other treatments. HOME CARE INSTRUCTIONS   Drink plenty of water. Water helps thin the mucus so your sinuses can drain more easily.  Use a humidifier.  Inhale steam 3 to 4 times a day (for example, sit in the bathroom with the shower running).  Apply a warm, moist washcloth to your face 3 to 4 times a day, or as directed  by your health care provider.  Use saline nasal sprays to help moisten and clean your sinuses.  Take medicines only as directed by your health care provider.  If you were prescribed either an antibiotic or antifungal medicine, finish it all even if you start to feel better. SEEK IMMEDIATE MEDICAL CARE IF:  You have increasing pain or severe headaches.  You have nausea, vomiting, or drowsiness.  You have swelling around your face.  You have vision problems.  You have a stiff neck.  You have difficulty breathing. MAKE SURE YOU:   Understand these instructions.  Will watch your condition.  Will get help right away if you  are not doing well or get worse. Document Released: 09/03/2005 Document Revised: 01/18/2014 Document Reviewed: 09/18/2011 [facility] Patient Information 2015 [place], [place]. This information is not intended to replace advice given to you by your health care provider. Make sure you discuss any questions you have with your health care provider.  - Take meds as prescribed - Use a cool mist humidifier  -Use saline nose sprays frequently -Saline irrigations of the nose can be very helpful if done frequently.  * 4X daily for 1 week*  * Use of a nettie pot can be helpful with this. Follow directions with this* -Force fluids -For any cough or congestion  Use plain Mucinex- regular strength or max strength is fine   * Children- consult with Pharmacist for dosing -For fever or aces or pains- take tylenol or ibuprofen appropriate for age and weight.  * for fevers greater than 101 orally you may alternate ibuprofen and tylenol every  3 hours. -Throat lozenges if help   Evelina Dun, FNP

## 2014-11-16 ENCOUNTER — Other Ambulatory Visit: Payer: Self-pay | Admitting: Family

## 2014-11-16 LAB — VITAMIN D 25 HYDROXY (VIT D DEFICIENCY, FRACTURES): Vit D, 25-Hydroxy: 19.5 ng/mL — ABNORMAL LOW (ref 30.0–100.0)

## 2014-11-16 LAB — LIPID PANEL
CHOL/HDL RATIO: 3.4 ratio (ref 0.0–4.4)
CHOLESTEROL TOTAL: 152 mg/dL (ref 100–199)
HDL: 45 mg/dL (ref >39)
LDL CALC: 56 mg/dL (ref 0–99)
TRIGLYCERIDES: 254 mg/dL — AB (ref 0–149)
VLDL CHOLESTEROL CAL: 51 mg/dL — AB (ref 5–40)

## 2014-11-16 LAB — CMP14+EGFR
ALBUMIN: 4.1 g/dL (ref 3.6–4.8)
ALK PHOS: 119 IU/L — AB (ref 39–117)
ALT: 21 IU/L (ref 0–32)
AST: 25 IU/L (ref 0–40)
Albumin/Globulin Ratio: 1.6 (ref 1.1–2.5)
BUN / CREAT RATIO: 39 — AB (ref 11–26)
BUN: 28 mg/dL — ABNORMAL HIGH (ref 8–27)
Bilirubin Total: 0.4 mg/dL (ref 0.0–1.2)
CO2: 25 mmol/L (ref 18–29)
Calcium: 9.9 mg/dL (ref 8.7–10.3)
Chloride: 101 mmol/L (ref 97–108)
Creatinine, Ser: 0.71 mg/dL (ref 0.57–1.00)
GFR calc Af Amer: 105 mL/min/{1.73_m2} (ref >59)
GFR calc non Af Amer: 91 mL/min/{1.73_m2} (ref >59)
Globulin, Total: 2.6 g/dL (ref 1.5–4.5)
Glucose: 127 mg/dL — ABNORMAL HIGH (ref 65–99)
Potassium: 5.3 mmol/L — ABNORMAL HIGH (ref 3.5–5.2)
SODIUM: 142 mmol/L (ref 134–144)
Total Protein: 6.7 g/dL (ref 6.0–8.5)

## 2014-11-16 LAB — MICROALBUMIN, URINE: Microalbumin, Urine: 669.2 ug/mL — ABNORMAL HIGH (ref 0.0–17.0)

## 2014-11-16 LAB — THYROID PANEL WITH TSH
FREE THYROXINE INDEX: 2.3 (ref 1.2–4.9)
T3 Uptake Ratio: 30 % (ref 24–39)
T4 TOTAL: 7.8 ug/dL (ref 4.5–12.0)
TSH: 2.38 u[IU]/mL (ref 0.450–4.500)

## 2014-11-16 MED ORDER — EMPAGLIFLOZIN 10 MG PO TABS: 10.0000 mg | ORAL_TABLET | Freq: Every day | ORAL | Status: DC

## 2014-11-22 ENCOUNTER — Other Ambulatory Visit: Payer: Self-pay | Admitting: Family

## 2014-12-01 ENCOUNTER — Emergency Department (HOSPITAL_COMMUNITY)
Admission: EM | Admit: 2014-12-01 | Discharge: 2014-12-01 | Disposition: A | Discharge status: Home / Self Care | Payer: Medicare PPO | Attending: Emergency Medicine | Admitting: Emergency Medicine

## 2014-12-01 ENCOUNTER — Encounter (HOSPITAL_COMMUNITY): Payer: Self-pay | Admitting: Emergency Medicine

## 2014-12-01 ENCOUNTER — Emergency Department (HOSPITAL_COMMUNITY): Payer: Medicare PPO

## 2014-12-01 DIAGNOSIS — Z7951 Long term (current) use of inhaled steroids: Secondary | ICD-10-CM | POA: Insufficient documentation

## 2014-12-01 DIAGNOSIS — M109 Gout, unspecified: Secondary | ICD-10-CM | POA: Diagnosis not present

## 2014-12-01 DIAGNOSIS — Z8701 Personal history of pneumonia (recurrent): Secondary | ICD-10-CM | POA: Insufficient documentation

## 2014-12-01 DIAGNOSIS — J40 Bronchitis, not specified as acute or chronic: Secondary | ICD-10-CM

## 2014-12-01 DIAGNOSIS — Z794 Long term (current) use of insulin: Secondary | ICD-10-CM | POA: Diagnosis not present

## 2014-12-01 DIAGNOSIS — E039 Hypothyroidism, unspecified: Secondary | ICD-10-CM | POA: Diagnosis not present

## 2014-12-01 DIAGNOSIS — F419 Anxiety disorder, unspecified: Secondary | ICD-10-CM | POA: Diagnosis not present

## 2014-12-01 DIAGNOSIS — Z792 Long term (current) use of antibiotics: Secondary | ICD-10-CM | POA: Insufficient documentation

## 2014-12-01 DIAGNOSIS — E785 Hyperlipidemia, unspecified: Secondary | ICD-10-CM | POA: Diagnosis not present

## 2014-12-01 DIAGNOSIS — J45901 Unspecified asthma with (acute) exacerbation: Secondary | ICD-10-CM | POA: Insufficient documentation

## 2014-12-01 DIAGNOSIS — E119 Type 2 diabetes mellitus without complications: Secondary | ICD-10-CM | POA: Diagnosis not present

## 2014-12-01 DIAGNOSIS — Z79899 Other long term (current) drug therapy: Secondary | ICD-10-CM | POA: Diagnosis not present

## 2014-12-01 DIAGNOSIS — R197 Diarrhea, unspecified: Secondary | ICD-10-CM | POA: Diagnosis not present

## 2014-12-01 DIAGNOSIS — R079 Chest pain, unspecified: Secondary | ICD-10-CM | POA: Insufficient documentation

## 2014-12-01 DIAGNOSIS — E559 Vitamin D deficiency, unspecified: Secondary | ICD-10-CM | POA: Insufficient documentation

## 2014-12-01 DIAGNOSIS — I10 Essential (primary) hypertension: Secondary | ICD-10-CM | POA: Diagnosis not present

## 2014-12-01 DIAGNOSIS — R0602 Shortness of breath: Secondary | ICD-10-CM | POA: Diagnosis present

## 2014-12-01 DIAGNOSIS — M791 Myalgia: Secondary | ICD-10-CM | POA: Diagnosis not present

## 2014-12-01 DIAGNOSIS — E669 Obesity, unspecified: Secondary | ICD-10-CM | POA: Insufficient documentation

## 2014-12-01 DIAGNOSIS — J029 Acute pharyngitis, unspecified: Secondary | ICD-10-CM | POA: Insufficient documentation

## 2014-12-01 LAB — CBC WITH DIFFERENTIAL/PLATELET
Basophils Absolute: 0 10*3/uL (ref 0.0–0.1)
Basophils Relative: 0 % (ref 0–1)
Eosinophils Absolute: 0.1 10*3/uL (ref 0.0–0.7)
Eosinophils Relative: 1 % (ref 0–5)
HEMATOCRIT: 31.5 % — AB (ref 36.0–46.0)
Hemoglobin: 9.8 g/dL — ABNORMAL LOW (ref 12.0–15.0)
Lymphocytes Relative: 24 % (ref 12–46)
Lymphs Abs: 2.9 10*3/uL (ref 0.7–4.0)
MCH: 26.5 pg (ref 26.0–34.0)
MCHC: 31.1 g/dL (ref 30.0–36.0)
MCV: 85.1 fL (ref 78.0–100.0)
MONO ABS: 1 10*3/uL (ref 0.1–1.0)
Monocytes Relative: 8 % (ref 3–12)
Neutro Abs: 7.8 10*3/uL — ABNORMAL HIGH (ref 1.7–7.7)
Neutrophils Relative %: 67 % (ref 43–77)
Platelets: 294 10*3/uL (ref 150–400)
RBC: 3.7 MIL/uL — ABNORMAL LOW (ref 3.87–5.11)
RDW: 15.1 % (ref 11.5–15.5)
WBC: 11.8 10*3/uL — ABNORMAL HIGH (ref 4.0–10.5)

## 2014-12-01 LAB — BASIC METABOLIC PANEL
Anion gap: 10 (ref 5–15)
BUN: 27 mg/dL — ABNORMAL HIGH (ref 6–23)
CO2: 23 mmol/L (ref 19–32)
CREATININE: 0.99 mg/dL (ref 0.50–1.10)
Calcium: 9.3 mg/dL (ref 8.4–10.5)
Chloride: 105 mmol/L (ref 96–112)
GFR calc Af Amer: 69 mL/min — ABNORMAL LOW (ref >90)
GFR calc non Af Amer: 59 mL/min — ABNORMAL LOW (ref >90)
Glucose, Bld: 95 mg/dL (ref 70–99)
Potassium: 4.1 mmol/L (ref 3.5–5.1)
Sodium: 138 mmol/L (ref 135–145)

## 2014-12-01 LAB — I-STAT TROPONIN, ED: TROPONIN I, POC: 0 ng/mL (ref 0.00–0.08)

## 2014-12-01 MED ORDER — ALBUTEROL SULFATE HFA 108 (90 BASE) MCG/ACT IN AERS: 2.0000 | INHALATION_SPRAY | RESPIRATORY_TRACT | Status: DC | PRN

## 2014-12-01 MED ORDER — ALBUTEROL SULFATE HFA 108 (90 BASE) MCG/ACT IN AERS
4.0000 | INHALATION_SPRAY | Freq: Once | RESPIRATORY_TRACT | Status: AC
  Administered 2014-12-01: 4 via RESPIRATORY_TRACT
  Filled 2014-12-01: qty 6.7

## 2014-12-01 NOTE — ED Notes (Signed)
MD Linker at bedside.

## 2014-12-01 NOTE — ED Notes (Signed)
Per EMS, pt from home c/o of central chest tightness x 2 weeks and SOB x 3 weeks. Pt was treated for pneumonia 2 weeks ago and completed antibiotic therapy. Pt denies cough or fever, but states the SOB has not improved. NAD noted. VSS.

## 2014-12-01 NOTE — ED Notes (Signed)
Pt a/o x 4 on d/c in wheelchair with family. 

## 2014-12-01 NOTE — Discharge Instructions (Signed)
Upper Respiratory Infection, Adult An upper respiratory infection (URI) is also known as the common cold. It is often caused by a type of germ (virus). Colds are easily spread (contagious). You can pass it to others by kissing, coughing, sneezing, or drinking out of the same glass. Usually, you get better in 1 or 2 weeks.  HOME CARE   Only take medicine as told by your doctor.  Use a warm mist humidifier or breathe in steam from a hot shower.  Drink enough water and fluids to keep your pee (urine) clear or pale yellow.  Get plenty of rest.  Return to work when your temperature is back to normal or as told by your doctor. You may use a face mask and wash your hands to stop your cold from spreading. GET HELP RIGHT AWAY IF:   After the first few days, you feel you are getting worse.  You have questions about your medicine.  You have chills, shortness of breath, or brown or red spit (mucus).  You have yellow or brown snot (nasal discharge) or pain in the face, especially when you bend forward.  You have a fever, puffy (swollen) neck, pain when you swallow, or white spots in the back of your throat.  You have a bad headache, ear pain, sinus pain, or chest pain.  You have a high-pitched whistling sound when you breathe in and out (wheezing).  You have a lasting cough or cough up blood.  You have sore muscles or a stiff neck. MAKE SURE YOU:   Understand these instructions.  Will watch your condition.  Will get help right away if you are not doing well or get worse. Document Released: 02/20/2008 Document Revised: 11/26/2011 Document Reviewed: 12/09/2013 [employer] Patient Information 2015 [employer]. This information is not intended to replace advice given to you by your health care provider. Make sure you discuss any questions you have with your health care provider.  

## 2014-12-01 NOTE — ED Provider Notes (Signed)
CSN: [id number]     Arrival date & time 12/01/14  1856 History   First MD Initiated Contact with Patient 12/01/14 1904     Chief Complaint  Patient presents with  . Shortness of Breath  . Chest Pain     (Consider location/radiation/quality/duration/timing/severity/associated sxs/prior Treatment) Patient is a 64 y.o. female presenting with URI. The history is provided by the patient.  URI Presenting symptoms: congestion, cough, fatigue and sore throat   Presenting symptoms: no fever   Severity:  Moderate Onset quality:  Gradual Duration:  3 weeks Timing:  Constant Progression:  Worsening Chronicity:  New Relieved by:  None tried Worsened by:  Nothing tried Ineffective treatments:  None tried Associated symptoms: myalgias and wheezing   Associated symptoms: no neck pain and no swollen glands   Risk factors: chronic respiratory disease and diabetes mellitus   Risk factors: no immunosuppression     Past Medical History  Diagnosis Date  . Hypothyroid   . Hypertension   . Hyperlipidemia   . Diabetes mellitus without complication   . Anxiety   . Obesity   . Allergy   . Asthma   . Vitamin D deficiency   . Gout   . Neuropathy    Past Surgical History  Procedure Laterality Date  . Breast lumpectomy    . Rectal fissure     Family History  Problem Relation Age of Onset  . Cancer Mother     colon  . Diabetes Mother   . Hypertension Mother   . Dementia Mother   . Heart disease Father   . Asthma Sister   . Cancer Sister     breast  . Asthma Sister   . Scoliosis Sister   . Diabetes Sister   . Asthma Sister    History  Substance Use Topics  . Smoking status: Never Smoker   . Smokeless tobacco: Not on file  . Alcohol Use: No   OB History    No data available     Review of Systems  Constitutional: Positive for fatigue. Negative for fever.  HENT: Positive for congestion and sore throat.   Respiratory: Positive for cough and wheezing.   Cardiovascular:  Positive for chest pain ("fullness").  Gastrointestinal: Positive for diarrhea. Negative for nausea, vomiting and abdominal pain.  Musculoskeletal: Positive for myalgias. Negative for neck pain.  All other systems reviewed and are negative.     Allergies  Review of patient's allergies indicates no known allergies.  Home Medications   Prior to Admission medications   Medication Sig Start Date End Date Taking? Authorizing Provider  acyclovir (ZOVIRAX) 400 MG tablet Take one tablet every day 03/12/14  Yes Lodema Pilot, PA-C  albuterol-ipratropium (COMBIVENT) 18-103 MCG/ACT inhaler Inhale 2 puffs into the lungs as needed for wheezing or shortness of breath. 06/16/14  Yes [name], FNP  allopurinol (ZYLOPRIM) 300 MG tablet Take 1 tablet (300 mg total) by mouth daily. 11/15/14  Yes [name], FNP  ALPRAZolam Duanne Moron) 0.5 MG tablet Take 1 tablet (0.5 mg total) by mouth 2 (two) times daily as needed for anxiety. 11/15/14  Yes [name], FNP  amLODipine (NORVASC) 10 MG tablet Take 1 tablet (10 mg total) by mouth daily. 04/05/14  Yes Lodema Pilot, PA-C  amoxicillin-clavulanate (AUGMENTIN) 875-125 MG per tablet Take 1 tablet by mouth 2 (two) times daily. 11/15/14  Yes [name], FNP  atorvastatin (LIPITOR) 40 MG tablet Take 1 tablet (40 mg total) by mouth  daily. 06/18/14  Yes [name], FNP  B-D INS SYRINGE 0.5CC/31GX5/16 31G X 5/16" 0.5 ML MISC USE TO INJECT INSULIN 5 TIMES A DAY AS INSTRUCTED 11/24/14  Yes [name], FNP  B-D UF III MINI PEN NEEDLES 31G X 5 MM MISC USE TO GIVE LANTUS INJECTION    TWICE A DAY AS INSTRUCTED 11/24/14  Yes [name], FNP  Blood Glucose Calibration (OT ULTRA/FASTTK CNTRL SOLN) SOLN  01/13/14  Yes Historical Provider, MD  Blood Glucose Monitoring Suppl (ONE TOUCH ULTRA 2) W/DEVICE KIT Check blood sugar 5x a day 12/09/12  Yes Historical Provider, MD  budesonide-formoterol (SYMBICORT) 160-4.5 MCG/ACT inhaler Inhale 2 puffs into the  lungs 2 (two) times daily. 06/16/14  Yes [name], FNP  busPIRone (BUSPAR) 15 MG tablet Take 0.5 tablets (7.5 mg total) by mouth 2 (two) times daily. 11/02/14  Yes [name], FNP  ezetimibe (ZETIA) 10 MG tablet Take 1 tablet (10 mg total) by mouth daily. 03/12/14  Yes Lodema Pilot, PA-C  fenofibrate micronized (LOFIBRA) 134 MG capsule Take 1 capsule (134 mg total) by mouth daily before breakfast. 04/05/14  Yes Lodema Pilot, PA-C  fluticasone (FLONASE) 50 MCG/ACT nasal spray Place 2 sprays into both nostrils daily. 03/12/14  Yes Lodema Pilot, PA-C  furosemide (LASIX) 20 MG tablet Take 1 tablet (20 mg total) by mouth daily. 04/05/14  Yes Lodema Pilot, PA-C  glucose blood (ONE TOUCH ULTRA TEST) test strip Check blood sugars five times a day 05/20/14  Yes Chipper Herb, MD  HUMALOG 100 UNIT/ML injection INJECT 45 UNITS INTO THE SKIN THREE TIMES A DAY AS INSTRUCTED Patient taking differently: sliding scale 11/24/14  Yes [name], FNP  Insulin Glargine (LANTUS SOLOSTAR) 100 UNIT/ML Solostar Pen INJECT UP TO 55 UNITS UNDER THE SKIN TWICE A DAY AS INSTRUCTED Patient taking differently: Inject 45 Units into the skin 2 (two) times daily.  11/15/14  Yes [name], FNP  Lancet Devices ([place] LANCING DEV) [place]  01/13/14  Yes Historical Provider, MD  levothyroxine (SYNTHROID, LEVOTHROID) 50 MCG tablet TAKE 1 TABLET EVERY DAY BEFORE BREAKFAST 11/15/14  Yes [name], FNP  losartan (COZAAR) 100 MG tablet Take 1 tablet (100 mg total) by mouth daily. 03/12/14  Yes Lodema Pilot, PA-C  meclizine (ANTIVERT) 25 MG tablet TAKE 1 TABLET THREE TIMES DAILY AS NEEDED 08/19/14  Yes Chipper Herb, MD  metFORMIN (GLUCOPHAGE) 500 MG tablet Take 2 tablets (1,000 mg total) by mouth 2 (two) times daily with a meal. 11/15/14  Yes [name], FNP  metoprolol succinate (TOPROL-XL) 25 MG 24 hr tablet Take 1 tablet (25 mg total) by mouth daily. 06/16/14  Yes [name], FNP   montelukast (SINGULAIR) 10 MG tablet Take 1 tablet (10 mg total) by mouth at bedtime. 04/05/14  Yes Lodema Pilot, PA-C  omeprazole (PRILOSEC) 40 MG capsule Take 1 capsule (40 mg total) by mouth daily. 03/12/14  Yes Lodema Pilot, PA-C  traMADol (ULTRAM) 50 MG tablet Take 1 tablet (50 mg total) by mouth every 8 (eight) hours as needed. 11/15/14  Yes [name], FNP  TRUEPLUS LANCETS 28G MISC Test 5x day 12/30/12  Yes Historical Provider, MD  Vitamin D, Ergocalciferol, (DRISDOL) 50000 UNITS CAPS capsule Take 1 capsule (50,000 Units total) by mouth every 7 (seven) days. 06/18/14  Yes [name], FNP  albuterol (PROVENTIL HFA;VENTOLIN HFA) 108 (90 BASE) MCG/ACT inhaler Inhale  2 puffs into the lungs every 4 (four) hours as needed for wheezing or shortness of breath. 12/01/14   Larence Penning, MD  empagliflozin (JARDIANCE) 10 MG TABS tablet Take 10 mg by mouth daily. 11/16/14   Christy A Hawks, FNP   BP 132/65 mmHg  Pulse 90  Temp(Src) 98.2 F (36.8 C) (Oral)  Resp 18  SpO2 100% Physical Exam  Constitutional: She appears well-developed and well-nourished. No distress.  Anxious  HENT:  Head: Normocephalic and atraumatic.  Mouth/Throat: No oropharyngeal exudate.  Eyes: EOM are normal. Pupils are equal, round, and reactive to light.  Neck: Normal range of motion. Neck supple.  Cardiovascular: Normal rate, regular rhythm and normal heart sounds.  Exam reveals no gallop and no friction rub.   No murmur heard. Pulmonary/Chest: Effort normal and breath sounds normal. No respiratory distress. She has no wheezes. She has no rales. She exhibits no tenderness.  Abdominal: Soft. She exhibits no distension. There is no tenderness.  Musculoskeletal: Normal range of motion. She exhibits no edema.  Lymphadenopathy:    She has no cervical adenopathy.  Skin: Skin is warm and dry. No rash noted. She is not diaphoretic.  Nursing note and vitals reviewed.   ED Course  Procedures (including critical  care time) Labs Review Labs Reviewed  CBC WITH DIFFERENTIAL/PLATELET - Abnormal; Notable for the following:    WBC 11.8 (*)    RBC 3.70 (*)    Hemoglobin 9.8 (*)    HCT 31.5 (*)    Neutro Abs 7.8 (*)    All other components within normal limits  BASIC METABOLIC PANEL - Abnormal; Notable for the following:    BUN 27 (*)    GFR calc non Af Amer 59 (*)    GFR calc Af Amer 69 (*)    All other components within normal limits  Randolm Idol, ED    Imaging Review Dg Chest 2 View  12/01/2014   CLINICAL DATA:  Two week history of chest tightness. Three-week history of difficulty breathing  EXAM: CHEST  2 VIEW  COMPARISON:  None.  FINDINGS: There is stable patchy atelectasis in the left lower lobe. Lungs elsewhere clear. Heart is mildly enlarged with pulmonary vascularity within normal limits. No adenopathy. There is atherosclerotic change in the aorta. No adenopathy. No pneumothorax. No bone lesions.  IMPRESSION: Stable patchy atelectasis left base. Elsewhere lungs clear. Stable cardiac prominence.   Electronically Signed   By: Lowella Grip III M.D.   On: 12/01/2014 20:48     EKG Interpretation   Date/Time:  Wednesday December 01 2014 19:03:47 EDT Ventricular Rate:  94 PR Interval:  112 QRS Duration: 73 QT Interval:  338 QTC Calculation: 423 R Axis:   92 Text Interpretation:  Sinus rhythm Borderline short PR interval Right axis  deviation Probable anteroseptal infarct, old No old tracing to compare  Confirmed by [facility]  MD, MARTHA [phone]) on 12/01/2014 8:14:46 PM      MDM   Final diagnoses:  Bronchitis    64 year old female with a history of diabetes, hypertension, hyperlipidemia, anxiety presents with shortness of breath with nonproductive cough and chest fullness for the last 3 weeks. She states that she was treated for pneumonia 2 weeks ago but has not significantly improved. She also has a history of asthma and has not been using her nebulizer because her machine is  broken.  Afebrile, hemoglobin and crit stable, without hypoxia on arrival. She does have risk factors for ACS but this chest fullness has  been waxing and waning for a full week. Unlikely ACS despite her multiple risk factors. Her troponin will be sent. EKG is without ischemic changes. Troponin is negative and basic labs are unremarkable. She is symptomatically much improved with albuterol. Chest x-ray without focal consolidation and presentation consistent with likely continued viral illness or bronchitis. She is without hypoxia or other requirement for admission. We will treat her with albuterol and have her follow-up with her PCP in the next 48 hours.  Larence Penning, MD 12/01/14 2130  Alfonzo Beers, MD 12/01/14 [phone]

## 2014-12-01 NOTE — ED Notes (Signed)
Pt returned from xray

## 2014-12-07 ENCOUNTER — Telehealth: Payer: Self-pay | Admitting: Family

## 2014-12-07 MED ORDER — BENZONATATE 200 MG PO CAPS: 200.0000 mg | ORAL_CAPSULE | Freq: Three times a day (TID) | ORAL | Status: DC | PRN

## 2014-12-07 MED ORDER — METHYLPREDNISOLONE (PAK) 4 MG PO TABS: ORAL_TABLET | ORAL | Status: DC

## 2014-12-07 NOTE — Telephone Encounter (Signed)
Given pt a steroid dose pack and tessalon pearls. Pt is a diabetic and needs to check blood sugars regularly with steroids and cover with insulin accordingly.

## 2014-12-07 NOTE — Telephone Encounter (Signed)
Pt has had recent pneumonia and bronchitis She feels like she has had a relapse Continued cough and congestion and SOB Please call in stronger antibiotic and something for cough Pt is unable to come in for appt due to no transportation Please review and advise

## 2014-12-07 NOTE — Telephone Encounter (Signed)
Patient aware.

## 2014-12-09 ENCOUNTER — Other Ambulatory Visit: Payer: Self-pay | Admitting: Family Medicine

## 2014-12-09 ENCOUNTER — Other Ambulatory Visit: Payer: Self-pay | Admitting: Physician Assistant

## 2014-12-31 ENCOUNTER — Other Ambulatory Visit: Payer: Self-pay | Admitting: *Deleted

## 2014-12-31 DIAGNOSIS — E039 Hypothyroidism, unspecified: Secondary | ICD-10-CM

## 2014-12-31 DIAGNOSIS — K2100 Gastro-esophageal reflux disease with esophagitis, without bleeding: Secondary | ICD-10-CM

## 2014-12-31 DIAGNOSIS — E785 Hyperlipidemia, unspecified: Secondary | ICD-10-CM

## 2014-12-31 MED ORDER — OMEPRAZOLE 40 MG PO CPDR: 40.0000 mg | DELAYED_RELEASE_CAPSULE | Freq: Every day | ORAL | Status: DC

## 2014-12-31 MED ORDER — LEVOTHYROXINE SODIUM 50 MCG PO TABS: ORAL_TABLET | ORAL | Status: DC

## 2014-12-31 MED ORDER — EZETIMIBE 10 MG PO TABS: 10.0000 mg | ORAL_TABLET | Freq: Every day | ORAL | Status: DC

## 2014-12-31 MED ORDER — ACYCLOVIR 400 MG PO TABS: ORAL_TABLET | ORAL | Status: DC

## 2015-02-15 ENCOUNTER — Other Ambulatory Visit: Payer: Self-pay | Admitting: Family

## 2015-02-26 ENCOUNTER — Other Ambulatory Visit: Payer: Self-pay | Admitting: *Deleted

## 2015-02-26 DIAGNOSIS — T7840XS Allergy, unspecified, sequela: Secondary | ICD-10-CM

## 2015-02-26 DIAGNOSIS — I10 Essential (primary) hypertension: Secondary | ICD-10-CM

## 2015-02-26 MED ORDER — AMLODIPINE BESYLATE 10 MG PO TABS: 10.0000 mg | ORAL_TABLET | Freq: Every day | ORAL | Status: DC

## 2015-02-26 MED ORDER — MONTELUKAST SODIUM 10 MG PO TABS: 10.0000 mg | ORAL_TABLET | Freq: Every day | ORAL | Status: DC

## 2015-02-28 ENCOUNTER — Telehealth: Payer: Self-pay | Admitting: *Deleted

## 2015-02-28 ENCOUNTER — Telehealth: Payer: Self-pay | Admitting: Family

## 2015-02-28 MED ORDER — SULFAMETHOXAZOLE-TRIMETHOPRIM 800-160 MG PO TABS: 1.0000 | ORAL_TABLET | Freq: Two times a day (BID) | ORAL | Status: DC

## 2015-02-28 NOTE — Telephone Encounter (Signed)
Patient has a check up appt for tomorrow.  She did not want to pay for two visits with limited funds at this time.  She does not think she can stand the misery all night with the UTI.  Please send in a script and she will be here in the morning.

## 2015-02-28 NOTE — Telephone Encounter (Signed)
Bactrim called to [facility].  Script was cancelled at mail order.

## 2015-02-28 NOTE — Telephone Encounter (Signed)
Aware, script sent in for UTI.

## 2015-02-28 NOTE — Telephone Encounter (Signed)
PT NTBS

## 2015-02-28 NOTE — Telephone Encounter (Signed)
Prescription sent to pharmacy.

## 2015-03-01 ENCOUNTER — Encounter: Payer: Self-pay | Admitting: Family

## 2015-03-01 ENCOUNTER — Ambulatory Visit (INDEPENDENT_AMBULATORY_CARE_PROVIDER_SITE_OTHER): Payer: Medicare PPO | Admitting: Family

## 2015-03-01 VITALS — BP 144/72 | HR 96 | Temp 97.0°F | Ht 65.0 in | Wt 282.0 lb

## 2015-03-01 DIAGNOSIS — F329 Major depressive disorder, single episode, unspecified: Secondary | ICD-10-CM | POA: Diagnosis not present

## 2015-03-01 DIAGNOSIS — E669 Obesity, unspecified: Secondary | ICD-10-CM

## 2015-03-01 DIAGNOSIS — IMO0001 Reserved for inherently not codable concepts without codable children: Secondary | ICD-10-CM

## 2015-03-01 DIAGNOSIS — R300 Dysuria: Secondary | ICD-10-CM | POA: Diagnosis not present

## 2015-03-01 DIAGNOSIS — E039 Hypothyroidism, unspecified: Secondary | ICD-10-CM

## 2015-03-01 DIAGNOSIS — E1165 Type 2 diabetes mellitus with hyperglycemia: Secondary | ICD-10-CM | POA: Diagnosis not present

## 2015-03-01 DIAGNOSIS — E785 Hyperlipidemia, unspecified: Secondary | ICD-10-CM

## 2015-03-01 DIAGNOSIS — K219 Gastro-esophageal reflux disease without esophagitis: Secondary | ICD-10-CM

## 2015-03-01 DIAGNOSIS — F419 Anxiety disorder, unspecified: Secondary | ICD-10-CM

## 2015-03-01 DIAGNOSIS — G629 Polyneuropathy, unspecified: Secondary | ICD-10-CM

## 2015-03-01 DIAGNOSIS — M109 Gout, unspecified: Secondary | ICD-10-CM

## 2015-03-01 DIAGNOSIS — J45909 Unspecified asthma, uncomplicated: Secondary | ICD-10-CM

## 2015-03-01 DIAGNOSIS — E559 Vitamin D deficiency, unspecified: Secondary | ICD-10-CM | POA: Diagnosis not present

## 2015-03-01 DIAGNOSIS — I10 Essential (primary) hypertension: Secondary | ICD-10-CM

## 2015-03-01 DIAGNOSIS — F32A Depression, unspecified: Secondary | ICD-10-CM

## 2015-03-01 LAB — POCT GLYCOSYLATED HEMOGLOBIN (HGB A1C): Hemoglobin A1C: 7.6

## 2015-03-01 LAB — POCT UA - MICROSCOPIC ONLY
Bacteria, U Microscopic: NEGATIVE
CRYSTALS, UR, HPF, POC: NEGATIVE
Casts, Ur, LPF, POC: NEGATIVE
RBC, urine, microscopic: NEGATIVE
Yeast, UA: NEGATIVE

## 2015-03-01 LAB — POCT URINALYSIS DIPSTICK
BILIRUBIN UA: NEGATIVE
GLUCOSE UA: NEGATIVE
KETONES UA: NEGATIVE
NITRITE UA: NEGATIVE
RBC UA: NEGATIVE
Spec Grav, UA: 1.015
UROBILINOGEN UA: NEGATIVE
pH, UA: 5

## 2015-03-01 MED ORDER — METOPROLOL SUCCINATE ER 50 MG PO TB24
50.0000 mg | ORAL_TABLET | Freq: Every day | ORAL | Status: DC
Start: 2015-03-01 — End: 2015-11-14

## 2015-03-01 NOTE — Addendum Note (Signed)
Addended by: Earlene Plater on: 03/01/2015 09:21 AM   Modules accepted: Miquel Dunn

## 2015-03-01 NOTE — Progress Notes (Signed)
Subjective:    Patient ID: Mary Raymond, female    DOB: 1950/10/02, 64 y.o.   MRN: [id number]  Hypertension This is a chronic problem. The current episode started more than 1 year ago. The problem has been waxing and waning since onset. The problem is uncontrolled. Associated symptoms include anxiety and peripheral edema (At times). Pertinent negatives include no blurred vision, chest pain, headaches, palpitations or shortness of breath. Risk factors for coronary artery disease include diabetes mellitus, dyslipidemia, family history, obesity and post-menopausal state. Past treatments include calcium channel blockers and angiotensin blockers. The current treatment provides mild improvement. Hypertensive end-organ damage includes a thyroid problem. There is no history of kidney disease, CAD/MI, CVA or heart failure. There is no history of sleep apnea.  Diabetes She presents for her follow-up diabetic visit. She has type 2 diabetes mellitus. Hypoglycemia symptoms include nervousness/anxiousness. Pertinent negatives for hypoglycemia include no confusion, dizziness, headaches or mood changes. Associated symptoms include fatigue. Pertinent negatives for diabetes include no blurred vision, no chest pain, no foot paresthesias, no foot ulcerations, no visual change and no weight loss. Symptoms are stable. Diabetic complications include peripheral neuropathy. Pertinent negatives for diabetic complications include no CVA, heart disease or nephropathy. Risk factors for coronary artery disease include diabetes mellitus, dyslipidemia, hypertension, obesity, post-menopausal, sedentary lifestyle and family history. Current diabetic treatment includes insulin injections and oral agent (dual therapy). She is compliant with treatment all of the time. Her weight is fluctuating dramatically. She is following a generally unhealthy diet. She rarely participates in exercise. Her breakfast blood glucose range is generally  180-200 mg/dl. An ACE inhibitor/angiotensin II receptor blocker is being taken. Eye exam is not current.  Hyperlipidemia This is a chronic problem. The current episode started more than 1 year ago. The problem is uncontrolled. Recent lipid tests were reviewed and are high. Exacerbating diseases include diabetes and hypothyroidism. Factors aggravating her hyperlipidemia include fatty foods. Associated symptoms include leg pain. Pertinent negatives include no chest pain, myalgias or shortness of breath. Current antihyperlipidemic treatment includes statins. The current treatment provides moderate improvement of lipids. Risk factors for coronary artery disease include diabetes mellitus, dyslipidemia, hypertension, obesity, post-menopausal and a sedentary lifestyle.  Anxiety Presents for follow-up visit. Symptoms include depressed mood, excessive worry, irritability and nervous/anxious behavior. Patient reports no chest pain, confusion, dizziness, nausea, palpitations or shortness of breath. Symptoms occur most days. The severity of symptoms is moderate.   Her past medical history is significant for anxiety/panic attacks, asthma and depression. Past treatments include SSRIs. The treatment provided mild relief. Compliance with prior treatments has been good.  Gastrophageal Reflux She complains of coughing. She reports no chest pain, no heartburn, no hoarse voice, no nausea, no sore throat or no wheezing. This is a chronic problem. The current episode started more than 1 year ago. The problem occurs rarely. The problem has been waxing and waning. Associated symptoms include fatigue. Pertinent negatives include no weight loss. She has tried a PPI for the symptoms. The treatment provided mild relief.  Asthma She complains of cough and frequent throat clearing. There is no hoarse voice, shortness of breath or wheezing. This is a chronic problem. The current episode started more than 1 year ago. The problem occurs  intermittently. The problem has been unchanged. The cough is non-productive. Associated symptoms include ear congestion, ear pain, nasal congestion and rhinorrhea. Pertinent negatives include no chest pain, headaches, heartburn, myalgias, sore throat or weight loss. Her symptoms are alleviated by rest and leukotriene antagonist.  She reports moderate improvement on treatment. Her past medical history is significant for asthma.  Thyroid Problem Symptoms include anxiety, depressed mood, diarrhea, dry skin and fatigue. Patient reports no constipation, hoarse voice, palpitations, visual change or weight loss. The symptoms have been worsening. Past treatments include levothyroxine. The treatment provided moderate relief. Her past medical history is significant for diabetes and hyperlipidemia. There is no history of heart failure.  Urinary Tract Infection  This is a new problem. The current episode started in the past 7 days. The problem occurs every urination. The problem has been gradually worsening. The quality of the pain is described as aching. The pain is at a severity of 5/10. The pain is mild. Associated symptoms include frequency, hesitancy and urgency. Pertinent negatives include no flank pain, hematuria, nausea or vomiting. She has tried antibiotics for the symptoms. The treatment provided moderate relief.      Review of Systems  Constitutional: Positive for irritability and fatigue. Negative for weight loss.  HENT: Positive for ear pain and rhinorrhea. Negative for hoarse voice and sore throat.   Eyes: Negative.  Negative for blurred vision.  Respiratory: Positive for cough. Negative for shortness of breath and wheezing.   Cardiovascular: Negative.  Negative for chest pain and palpitations.  Gastrointestinal: Positive for diarrhea. Negative for heartburn, nausea, vomiting and constipation.  Endocrine: Negative.   Genitourinary: Positive for hesitancy, urgency and frequency. Negative for  hematuria and flank pain.  Musculoskeletal: Negative.  Negative for myalgias.  Neurological: Negative.  Negative for dizziness and headaches.  Hematological: Negative.   Psychiatric/Behavioral: Negative for confusion. The patient is nervous/anxious.   All other systems reviewed and are negative.      Objective:   Physical Exam  Constitutional: She is oriented to person, place, and time. She appears well-developed and well-nourished. No distress.  HENT:  Head: Normocephalic and atraumatic.  Right Ear: External ear normal.  Left Ear: External ear normal.  Nose: Nose normal.  Mouth/Throat: Oropharynx is clear and moist.  Eyes: Pupils are equal, round, and reactive to light.  Neck: Normal range of motion. Neck supple. No thyromegaly present.  Cardiovascular: Normal rate, regular rhythm, normal heart sounds and intact distal pulses.   No murmur heard. Pulmonary/Chest: Effort normal and breath sounds normal. No respiratory distress. She has no wheezes.  Abdominal: Soft. Bowel sounds are normal. She exhibits no distension. There is no tenderness.  Musculoskeletal: Normal range of motion. She exhibits edema (trace amount in BLE). She exhibits no tenderness.  Neurological: She is alert and oriented to person, place, and time. She has normal reflexes. No cranial nerve deficit.  Skin: Skin is warm and dry.  Psychiatric: She has a normal mood and affect. Her behavior is normal. Judgment and thought content normal.  Vitals reviewed.     BP 144/72 mmHg  Pulse 96  Temp(Src) 97 F (36.1 C) (Oral)  Ht $R'5\' 5"'KX$  (1.651 m)  Wt 282 lb (127.914 kg)  BMI 46.93 kg/m2     Assessment & Plan:  1. Dysuria - POCT UA - Microscopic Only - POCT urinalysis dipstick - Urine culture -Sent Bactrim rx to pharmacy yesterday per pt's symptoms  - CMP14+EGFR  2. Essential hypertension -Pt's metoprolol increased to 50 mg daily from 25 mg -Dash diet information given -Exercise encouraged - Stress Management    -Continue current meds -RTO in 2 weeks  - CMP14+EGFR - metoprolol succinate (TOPROL-XL) 50 MG 24 hr tablet; Take 1 tablet (50 mg total) by mouth daily. Take with or  immediately following a meal.  Dispense: 90 tablet; Refill: 3  3. Asthma, unspecified asthma severity, uncomplicated - DQV50+ITUY  4. Gastroesophageal reflux disease, esophagitis presence not specified - CMP14+EGFR  5. Hypothyroidism, unspecified hypothyroidism type - CMP14+EGFR - Thyroid Panel With TSH  6. Hyperlipidemia - CMP14+EGFR - Lipid panel  7. Diabetes mellitus type 2, uncontrolled, without complications - POCT glycosylated hemoglobin (Hb A1C) - CMP14+EGFR  8. Anxiety - CMP14+EGFR  9. Obesity - CMP14+EGFR  10. Vitamin D deficiency  - CMP14+EGFR - Vit D  25 hydroxy (rtn osteoporosis monitoring)  11. Depression  - CMP14+EGFR  12. Gout without tophus, unspecified cause, unspecified chronicity, unspecified site - CMP14+EGFR  13. Neuropathy  - CMP14+EGFR   Continue all meds Labs pending Health Maintenance reviewed Diet and exercise encouraged RTO 2 weeks to recheck BP  Evelina Dun, FNP

## 2015-03-01 NOTE — Patient Instructions (Addendum)
Health Maintenance Adopting a healthy lifestyle and getting preventive care can go a long way to promote health and wellness. Talk with your health care provider about what schedule of regular examinations is right for you. This is a good chance for you to check in with your provider about disease prevention and staying healthy. In between checkups, there are plenty of things you can do on your own. Experts have done a lot of research about which lifestyle changes and preventive measures are most likely to keep you healthy. Ask your health care provider for more information. WEIGHT AND DIET  Eat a healthy diet  Be sure to include plenty of vegetables, fruits, low-fat dairy products, and lean protein.  Do not eat a lot of foods high in solid fats, added sugars, or salt.  Get regular exercise. This is one of the most important things you can do for your health.  Most adults should exercise for at least 150 minutes each week. The exercise should increase your heart rate and make you sweat (moderate-intensity exercise).  Most adults should also do strengthening exercises at least twice a week. This is in addition to the moderate-intensity exercise.  Maintain a healthy weight  Body mass index (BMI) is a measurement that can be used to identify possible weight problems. It estimates body fat based on height and weight. Your health care provider can help determine your BMI and help you achieve or maintain a healthy weight.  For females 25 years of age and older:   A BMI below 18.5 is considered underweight.  A BMI of 18.5 to 24.9 is normal.  A BMI of 25 to 29.9 is considered overweight.  A BMI of 30 and above is considered obese.  Watch levels of cholesterol and blood lipids  You should start having your blood tested for lipids and cholesterol at 64 years of age, then have this test every 5 years.  You may need to have your cholesterol levels checked more often if:  Your lipid or  cholesterol levels are high.  You are older than 64 years of age.  You are at high risk for heart disease.  CANCER SCREENING   Lung Cancer  Lung cancer screening is recommended for adults 97-92 years old who are at high risk for lung cancer because of a history of smoking.  A yearly low-dose CT scan of the lungs is recommended for people who:  Currently smoke.  Have quit within the past 15 years.  Have at least a 30-pack-year history of smoking. A pack year is smoking an average of one pack of cigarettes a day for 1 year.  Yearly screening should continue until it has been 15 years since you quit.  Yearly screening should stop if you develop a health problem that would prevent you from having lung cancer treatment.  Breast Cancer  Practice breast self-awareness. This means understanding how your breasts normally appear and feel.  It also means doing regular breast self-exams. Let your health care provider know about any changes, no matter how small.  If you are in your 20s or 30s, you should have a clinical breast exam (CBE) by a health care provider every 1-3 years as part of a regular health exam.  If you are 76 or older, have a CBE every year. Also consider having a breast X-ray (mammogram) every year.  If you have a family history of breast cancer, talk to your health care provider about genetic screening.  If you are  at high risk for breast cancer, talk to your health care provider about having an MRI and a mammogram every year.  Breast cancer gene (BRCA) assessment is recommended for women who have family members with BRCA-related cancers. BRCA-related cancers include:  Breast.  Ovarian.  Tubal.  Peritoneal cancers.  Results of the assessment will determine the need for genetic counseling and BRCA1 and BRCA2 testing. Cervical Cancer Routine pelvic examinations to screen for cervical cancer are no longer recommended for nonpregnant women who are considered low  risk for cancer of the pelvic organs (ovaries, uterus, and vagina) and who do not have symptoms. A pelvic examination may be necessary if you have symptoms including those associated with pelvic infections. Ask your health care provider if a screening pelvic exam is right for you.   The Pap test is the screening test for cervical cancer for women who are considered at risk.  If you had a hysterectomy for a problem that was not cancer or a condition that could lead to cancer, then you no longer need Pap tests.  If you are older than 65 years, and you have had normal Pap tests for the past 10 years, you no longer need to have Pap tests.  If you have had past treatment for cervical cancer or a condition that could lead to cancer, you need Pap tests and screening for cancer for at least 20 years after your treatment.  If you no longer get a Pap test, assess your risk factors if they change (such as having a new sexual partner). This can affect whether you should start being screened again.  Some women have medical problems that increase their chance of getting cervical cancer. If this is the case for you, your health care provider may recommend more frequent screening and Pap tests.  The human papillomavirus (HPV) test is another test that may be used for cervical cancer screening. The HPV test looks for the virus that can cause cell changes in the cervix. The cells collected during the Pap test can be tested for HPV.  The HPV test can be used to screen women 30 years of age and older. Getting tested for HPV can extend the interval between normal Pap tests from three to five years.  An HPV test also should be used to screen women of any age who have unclear Pap test results.  After 64 years of age, women should have HPV testing as often as Pap tests.  Colorectal Cancer  This type of cancer can be detected and often prevented.  Routine colorectal cancer screening usually begins at 64 years of  age and continues through 64 years of age.  Your health care provider may recommend screening at an earlier age if you have risk factors for colon cancer.  Your health care provider may also recommend using home test kits to check for hidden blood in the stool.  A small camera at the end of a tube can be used to examine your colon directly (sigmoidoscopy or colonoscopy). This is done to check for the earliest forms of colorectal cancer.  Routine screening usually begins at age 50.  Direct examination of the colon should be repeated every 5-10 years through 64 years of age. However, you may need to be screened more often if early forms of precancerous polyps or small growths are found. Skin Cancer  Check your skin from head to toe regularly.  Tell your health care provider about any new moles or changes in   moles, especially if there is a change in a mole's shape or color.  Also tell your health care provider if you have a mole that is larger than the size of a pencil eraser.  Always use sunscreen. Apply sunscreen liberally and repeatedly throughout the day.  Protect yourself by wearing long sleeves, pants, a wide-brimmed hat, and sunglasses whenever you are outside. HEART DISEASE, DIABETES, AND HIGH BLOOD PRESSURE   Have your blood pressure checked at least every 1-2 years. High blood pressure causes heart disease and increases the risk of stroke.  If you are between 75 years and 42 years old, ask your health care provider if you should take aspirin to prevent strokes.  Have regular diabetes screenings. This involves taking a blood sample to check your fasting blood sugar level.  If you are at a normal weight and have a low risk for diabetes, have this test once every three years after 64 years of age.  If you are overweight and have a high risk for diabetes, consider being tested at a younger age or more often. PREVENTING INFECTION  Hepatitis B  If you have a higher risk for  hepatitis B, you should be screened for this virus. You are considered at high risk for hepatitis B if:  You were born in a country where hepatitis B is common. Ask your health care provider which countries are considered high risk.  Your parents were born in a high-risk country, and you have not been immunized against hepatitis B (hepatitis B vaccine).  You have HIV or AIDS.  You use needles to inject street drugs.  You live with someone who has hepatitis B.  You have had sex with someone who has hepatitis B.  You get hemodialysis treatment.  You take certain medicines for conditions, including cancer, organ transplantation, and autoimmune conditions. Hepatitis C  Blood testing is recommended for:  Everyone born from [birth year] through 1965.  Anyone with known risk factors for hepatitis C. Sexually transmitted infections (STIs)  You should be screened for sexually transmitted infections (STIs) including gonorrhea and chlamydia if:  You are sexually active and are younger than 64 years of age.  You are older than 64 years of age and your health care provider tells you that you are at risk for this type of infection.  Your sexual activity has changed since you were last screened and you are at an increased risk for chlamydia or gonorrhea. Ask your health care provider if you are at risk.  If you do not have HIV, but are at risk, it may be recommended that you take a prescription medicine daily to prevent HIV infection. This is called pre-exposure prophylaxis (PrEP). You are considered at risk if:  You are sexually active and do not regularly use condoms or know the HIV status of your partner(s).  You take drugs by injection.  You are sexually active with a partner who has HIV. Talk with your health care provider about whether you are at high risk of being infected with HIV. If you choose to begin PrEP, you should first be tested for HIV. You should then be tested every 3 months for  as long as you are taking PrEP.  PREGNANCY   If you are premenopausal and you may become pregnant, ask your health care provider about preconception counseling.  If you may become pregnant, take 400 to 800 micrograms (mcg) of folic acid every day.  If you want to prevent pregnancy, talk to your  health care provider about birth control (contraception). OSTEOPOROSIS AND MENOPAUSE   Osteoporosis is a disease in which the bones lose minerals and strength with aging. This can result in serious bone fractures. Your risk for osteoporosis can be identified using a bone density scan.  If you are 65 years of age or older, or if you are at risk for osteoporosis and fractures, ask your health care provider if you should be screened.  Ask your health care provider whether you should take a calcium or vitamin D supplement to lower your risk for osteoporosis.  Menopause may have certain physical symptoms and risks.  Hormone replacement therapy may reduce some of these symptoms and risks. Talk to your health care provider about whether hormone replacement therapy is right for you.  HOME CARE INSTRUCTIONS   Schedule regular health, dental, and eye exams.  Stay current with your immunizations.   Do not use any tobacco products including cigarettes, chewing tobacco, or electronic cigarettes.  If you are pregnant, do not drink alcohol.  If you are breastfeeding, limit how much and how often you drink alcohol.  Limit alcohol intake to no more than 1 drink per day for nonpregnant women. One drink equals 12 ounces of beer, 5 ounces of wine, or 1 ounces of hard liquor.  Do not use street drugs.  Do not share needles.  Ask your health care provider for help if you need support or information about quitting drugs.  Tell your health care provider if you often feel depressed.  Tell your health care provider if you have ever been abused or do not feel safe at home. Document Released: 03/19/2011  Document Revised: 01/18/2014 Document Reviewed: 08/05/2013 Exit[employer] Patient Information 2015 [employer]. This information is not intended to replace advice given to you by your health care provider. Make sure you discuss any questions you have with your health care provider. Peripheral Edema You have swelling in your legs (peripheral edema). This swelling is due to excess accumulation of salt and water in your body. Edema may be a sign of heart, kidney or liver disease, or a side effect of a medication. It may also be due to problems in the leg veins. Elevating your legs and using special support stockings may be very helpful, if the cause of the swelling is due to poor venous circulation. Avoid long periods of standing, whatever the cause. Treatment of edema depends on identifying the cause. Chips, pretzels, pickles and other salty foods should be avoided. Restricting salt in your diet is almost always needed. Water pills (diuretics) are often used to remove the excess salt and water from your body via urine. These medicines prevent the kidney from reabsorbing sodium. This increases urine flow. Diuretic treatment may also result in lowering of potassium levels in your body. Potassium supplements may be needed if you have to use diuretics daily. Daily weights can help you keep track of your progress in clearing your edema. You should call your caregiver for follow up care as recommended. SEEK IMMEDIATE MEDICAL CARE IF:   You have increased swelling, pain, redness, or heat in your legs.  You develop shortness of breath, especially when lying down.  You develop chest or abdominal pain, weakness, or fainting.  You have a fever. Document Released: 10/11/2004 Document Revised: 11/26/2011 Document Reviewed: 09/21/2009 Exit[employer] Patient Information 2015 [employer]. This information is not intended to replace advice given to you by your health care provider. Make sure you discuss any questions you  have   with your health care provider.  

## 2015-03-02 ENCOUNTER — Other Ambulatory Visit: Payer: Self-pay | Admitting: Family

## 2015-03-02 ENCOUNTER — Telehealth: Payer: Self-pay | Admitting: *Deleted

## 2015-03-02 LAB — CMP14+EGFR
ALT: 13 IU/L (ref 0–32)
AST: 13 IU/L (ref 0–40)
Albumin/Globulin Ratio: 1.4 (ref 1.1–2.5)
Albumin: 3.9 g/dL (ref 3.6–4.8)
Alkaline Phosphatase: 110 IU/L (ref 39–117)
BILIRUBIN TOTAL: 0.3 mg/dL (ref 0.0–1.2)
BUN/Creatinine Ratio: 32 — ABNORMAL HIGH (ref 11–26)
BUN: 22 mg/dL (ref 8–27)
CHLORIDE: 101 mmol/L (ref 97–108)
CO2: 25 mmol/L (ref 18–29)
Calcium: 9.6 mg/dL (ref 8.7–10.3)
Creatinine, Ser: 0.69 mg/dL (ref 0.57–1.00)
GFR calc non Af Amer: 93 mL/min/{1.73_m2} (ref >59)
GFR, EST AFRICAN AMERICAN: 107 mL/min/{1.73_m2} (ref >59)
GLUCOSE: 107 mg/dL — AB (ref 65–99)
Globulin, Total: 2.7 g/dL (ref 1.5–4.5)
POTASSIUM: 4.7 mmol/L (ref 3.5–5.2)
Sodium: 144 mmol/L (ref 134–144)
TOTAL PROTEIN: 6.6 g/dL (ref 6.0–8.5)

## 2015-03-02 LAB — VITAMIN D 25 HYDROXY (VIT D DEFICIENCY, FRACTURES): Vit D, 25-Hydroxy: 18.4 ng/mL — ABNORMAL LOW (ref 30.0–100.0)

## 2015-03-02 LAB — LIPID PANEL
CHOL/HDL RATIO: 2.8 ratio (ref 0.0–4.4)
Cholesterol, Total: 123 mg/dL (ref 100–199)
HDL: 44 mg/dL (ref >39)
LDL CALC: 45 mg/dL (ref 0–99)
Triglycerides: 168 mg/dL — ABNORMAL HIGH (ref 0–149)
VLDL CHOLESTEROL CAL: 34 mg/dL (ref 5–40)

## 2015-03-02 LAB — THYROID PANEL WITH TSH
Free Thyroxine Index: 2.4 (ref 1.2–4.9)
T3 Uptake Ratio: 29 % (ref 24–39)
T4 TOTAL: 8.3 ug/dL (ref 4.5–12.0)
TSH: 2.52 u[IU]/mL (ref 0.450–4.500)

## 2015-03-02 LAB — URINE CULTURE

## 2015-03-02 MED ORDER — EMPAGLIFLOZIN 25 MG PO TABS: 25.0000 mg | ORAL_TABLET | Freq: Every day | ORAL | Status: DC

## 2015-03-02 MED ORDER — EMPAGLIFLOZIN 10 MG PO TABS: 10.0000 mg | ORAL_TABLET | Freq: Every day | ORAL | Status: DC

## 2015-03-02 MED ORDER — VITAMIN D (ERGOCALCIFEROL) 1.25 MG (50000 UNIT) PO CAPS: 50000.0000 [IU] | ORAL_CAPSULE | ORAL | Status: DC

## 2015-03-02 NOTE — Telephone Encounter (Signed)
-----   Message from [name], [place] sent at 03/02/2015  9:40 AM EDT ----- Mary Raymond 10 mg was sent to [facility] Pt's metoprolol was increased to 50 mg daily at office visit because her BP was elevated I will resend Vit D rx to [facility]

## 2015-03-02 NOTE — Telephone Encounter (Signed)
Pt notified of RXs and changes per [facility] understanding

## 2015-03-02 NOTE — Progress Notes (Signed)
RX sent to pharmacy  

## 2015-03-02 NOTE — Telephone Encounter (Signed)
Call to discuss medication.

## 2015-03-02 NOTE — Telephone Encounter (Signed)
-----   Message from [name], [place] sent at 03/02/2015  9:40 AM EDT ----- Vania Rea 10 mg was sent to [facility] Pt's metoprolol was increased to 50 mg daily at office visit because her BP was elevated I will resend Vit D rx to [facility]

## 2015-03-08 ENCOUNTER — Other Ambulatory Visit: Payer: Self-pay | Admitting: Family

## 2015-03-09 ENCOUNTER — Other Ambulatory Visit: Payer: Self-pay

## 2015-03-09 DIAGNOSIS — I10 Essential (primary) hypertension: Secondary | ICD-10-CM

## 2015-03-09 MED ORDER — MECLIZINE HCL 25 MG PO TABS: 25.0000 mg | ORAL_TABLET | Freq: Three times a day (TID) | ORAL | Status: DC | PRN

## 2015-03-09 MED ORDER — LOSARTAN POTASSIUM 100 MG PO TABS: 100.0000 mg | ORAL_TABLET | Freq: Every day | ORAL | Status: DC

## 2015-03-09 MED ORDER — FUROSEMIDE 20 MG PO TABS: 20.0000 mg | ORAL_TABLET | Freq: Every day | ORAL | Status: DC

## 2015-03-12 ENCOUNTER — Other Ambulatory Visit: Payer: Self-pay | Admitting: Family

## 2015-03-16 ENCOUNTER — Other Ambulatory Visit: Payer: Self-pay | Admitting: Family

## 2015-03-16 NOTE — Telephone Encounter (Signed)
Please review and advise.

## 2015-03-16 NOTE — Telephone Encounter (Signed)
RX ready for pick up 

## 2015-03-18 ENCOUNTER — Other Ambulatory Visit: Payer: Self-pay | Admitting: Family

## 2015-03-22 ENCOUNTER — Other Ambulatory Visit: Payer: Self-pay

## 2015-03-22 ENCOUNTER — Telehealth: Payer: Self-pay | Admitting: Family

## 2015-03-22 MED ORDER — GLUCOSE BLOOD VI STRP: ORAL_STRIP | Status: DC

## 2015-03-22 MED ORDER — ONETOUCH DELICA LANCING DEV MISC: Status: DC

## 2015-04-15 ENCOUNTER — Other Ambulatory Visit: Payer: Self-pay | Admitting: Family

## 2015-04-15 NOTE — Telephone Encounter (Signed)
Rx ready for pick up. 

## 2015-04-15 NOTE — Telephone Encounter (Signed)
Last seen 03/01/15, last filled 03/16/15. Rx will print

## 2015-04-16 ENCOUNTER — Telehealth: Payer: Self-pay

## 2015-04-16 NOTE — Telephone Encounter (Signed)
Per Mary Sella, patient was informed yesterday that Tramadol prescription was placed up front, she was left a message on her voicemail per [place].

## 2015-04-26 ENCOUNTER — Telehealth: Payer: Self-pay | Admitting: Family

## 2015-04-27 NOTE — Telephone Encounter (Signed)
Detailed message left that we do not have any test strip samples.

## 2015-05-18 ENCOUNTER — Other Ambulatory Visit: Payer: Self-pay | Admitting: Family Medicine

## 2015-05-22 ENCOUNTER — Other Ambulatory Visit: Payer: Self-pay | Admitting: Family

## 2015-05-24 ENCOUNTER — Other Ambulatory Visit: Payer: Self-pay | Admitting: *Deleted

## 2015-05-24 MED ORDER — ALBUTEROL SULFATE HFA 108 (90 BASE) MCG/ACT IN AERS: 2.0000 | INHALATION_SPRAY | RESPIRATORY_TRACT | Status: DC | PRN

## 2015-05-25 ENCOUNTER — Other Ambulatory Visit: Payer: Self-pay | Admitting: Family

## 2015-05-25 NOTE — Telephone Encounter (Signed)
Please advise on refill- last seen 03/01/15 for UTI.

## 2015-06-02 ENCOUNTER — Other Ambulatory Visit: Payer: Self-pay | Admitting: Family

## 2015-06-06 ENCOUNTER — Other Ambulatory Visit: Payer: Self-pay | Admitting: Physician Assistant

## 2015-06-08 ENCOUNTER — Other Ambulatory Visit: Payer: Self-pay | Admitting: Family

## 2015-06-08 NOTE — Telephone Encounter (Signed)
Last filled 04/20/15, last seen 03/01/15. Route to pool, nurse call in at [facility]

## 2015-06-09 NOTE — Telephone Encounter (Signed)
Last seen 03/01/15  Mary Raymond  I don't see this med on EPIC list

## 2015-06-28 ENCOUNTER — Other Ambulatory Visit: Payer: Self-pay | Admitting: Family

## 2015-07-05 ENCOUNTER — Encounter: Payer: Self-pay | Admitting: Family

## 2015-07-13 ENCOUNTER — Other Ambulatory Visit: Payer: Self-pay | Admitting: Family

## 2015-07-13 NOTE — Telephone Encounter (Signed)
Xanax refill called in. 

## 2015-07-13 NOTE — Telephone Encounter (Signed)
Last seen 03/01/15  Mary Raymond  If approved route to nurse to call into [facility]

## 2015-07-29 ENCOUNTER — Other Ambulatory Visit: Payer: Self-pay | Admitting: Family

## 2015-08-09 ENCOUNTER — Other Ambulatory Visit: Payer: Self-pay | Admitting: Family

## 2015-08-10 ENCOUNTER — Ambulatory Visit (INDEPENDENT_AMBULATORY_CARE_PROVIDER_SITE_OTHER): Payer: Medicare PPO | Admitting: Family

## 2015-08-10 ENCOUNTER — Encounter (INDEPENDENT_AMBULATORY_CARE_PROVIDER_SITE_OTHER): Payer: Self-pay

## 2015-08-10 ENCOUNTER — Encounter: Payer: Self-pay | Admitting: Family

## 2015-08-10 ENCOUNTER — Other Ambulatory Visit: Payer: Self-pay | Admitting: Family

## 2015-08-10 VITALS — BP 159/74 | HR 93 | Temp 97.2°F | Ht 65.0 in | Wt 288.0 lb

## 2015-08-10 DIAGNOSIS — F32A Depression, unspecified: Secondary | ICD-10-CM

## 2015-08-10 DIAGNOSIS — I10 Essential (primary) hypertension: Secondary | ICD-10-CM | POA: Diagnosis not present

## 2015-08-10 DIAGNOSIS — E039 Hypothyroidism, unspecified: Secondary | ICD-10-CM

## 2015-08-10 DIAGNOSIS — F419 Anxiety disorder, unspecified: Secondary | ICD-10-CM

## 2015-08-10 DIAGNOSIS — F329 Major depressive disorder, single episode, unspecified: Secondary | ICD-10-CM

## 2015-08-10 DIAGNOSIS — M109 Gout, unspecified: Secondary | ICD-10-CM | POA: Diagnosis not present

## 2015-08-10 DIAGNOSIS — R609 Edema, unspecified: Secondary | ICD-10-CM

## 2015-08-10 DIAGNOSIS — E785 Hyperlipidemia, unspecified: Secondary | ICD-10-CM | POA: Diagnosis not present

## 2015-08-10 DIAGNOSIS — Z794 Long term (current) use of insulin: Secondary | ICD-10-CM

## 2015-08-10 DIAGNOSIS — Z1231 Encounter for screening mammogram for malignant neoplasm of breast: Secondary | ICD-10-CM

## 2015-08-10 DIAGNOSIS — M150 Primary generalized (osteo)arthritis: Secondary | ICD-10-CM

## 2015-08-10 DIAGNOSIS — K219 Gastro-esophageal reflux disease without esophagitis: Secondary | ICD-10-CM

## 2015-08-10 DIAGNOSIS — E1165 Type 2 diabetes mellitus with hyperglycemia: Secondary | ICD-10-CM

## 2015-08-10 DIAGNOSIS — M159 Polyosteoarthritis, unspecified: Secondary | ICD-10-CM

## 2015-08-10 DIAGNOSIS — Z23 Encounter for immunization: Secondary | ICD-10-CM | POA: Diagnosis not present

## 2015-08-10 DIAGNOSIS — G8929 Other chronic pain: Secondary | ICD-10-CM

## 2015-08-10 DIAGNOSIS — Z1159 Encounter for screening for other viral diseases: Secondary | ICD-10-CM

## 2015-08-10 DIAGNOSIS — M549 Dorsalgia, unspecified: Secondary | ICD-10-CM

## 2015-08-10 DIAGNOSIS — T7840XS Allergy, unspecified, sequela: Secondary | ICD-10-CM

## 2015-08-10 DIAGNOSIS — E559 Vitamin D deficiency, unspecified: Secondary | ICD-10-CM

## 2015-08-10 DIAGNOSIS — G629 Polyneuropathy, unspecified: Secondary | ICD-10-CM

## 2015-08-10 DIAGNOSIS — IMO0001 Reserved for inherently not codable concepts without codable children: Secondary | ICD-10-CM

## 2015-08-10 DIAGNOSIS — R600 Localized edema: Secondary | ICD-10-CM

## 2015-08-10 LAB — POCT GLYCOSYLATED HEMOGLOBIN (HGB A1C): Hemoglobin A1C: 6.8

## 2015-08-10 MED ORDER — TRAMADOL HCL 50 MG PO TABS: 100.0000 mg | ORAL_TABLET | Freq: Four times a day (QID) | ORAL | Status: DC | PRN

## 2015-08-10 MED ORDER — FUROSEMIDE 20 MG PO TABS: 20.0000 mg | ORAL_TABLET | Freq: Two times a day (BID) | ORAL | Status: DC

## 2015-08-10 NOTE — Progress Notes (Signed)
Subjective:    Patient ID: Mary Raymond, female    DOB: 08/27/1951, 64 y.o.   MRN: [id number]  Pt presents to the office today for chronic follow up.  Diabetes She presents for her follow-up diabetic visit. She has type 2 diabetes mellitus. Hypoglycemia symptoms include nervousness/anxiousness. Pertinent negatives for hypoglycemia include no confusion, dizziness, headaches or mood changes. Associated symptoms include fatigue. Pertinent negatives for diabetes include no blurred vision, no chest pain, no foot paresthesias, no foot ulcerations, no visual change and no weight loss. Symptoms are stable. Diabetic complications include peripheral neuropathy. Pertinent negatives for diabetic complications include no CVA, heart disease or nephropathy. Risk factors for coronary artery disease include diabetes mellitus, dyslipidemia, hypertension, obesity, post-menopausal, sedentary lifestyle and family history. Current diabetic treatment includes insulin injections and oral agent (dual therapy). She is compliant with treatment all of the time. Her weight is fluctuating dramatically. She is following a generally unhealthy diet. She never participates in exercise. Her breakfast blood glucose range is generally 180-200 mg/dl. An ACE inhibitor/angiotensin II receptor blocker is being taken. Eye exam is not current.  Hypertension This is a chronic problem. The current episode started more than 1 year ago. The problem has been waxing and waning since onset. The problem is uncontrolled. Associated symptoms include anxiety and peripheral edema (At times). Pertinent negatives include no blurred vision, chest pain, headaches, palpitations or shortness of breath. Risk factors for coronary artery disease include diabetes mellitus, dyslipidemia, family history, obesity and post-menopausal state. Past treatments include calcium channel blockers and angiotensin blockers. The current treatment provides mild improvement.  Hypertensive end-organ damage includes a thyroid problem. There is no history of kidney disease, CAD/MI, CVA or heart failure. There is no history of sleep apnea.  Hyperlipidemia This is a chronic problem. The current episode started more than 1 year ago. The problem is controlled. Recent lipid tests were reviewed and are normal. Exacerbating diseases include diabetes and hypothyroidism. Factors aggravating her hyperlipidemia include fatty foods. Associated symptoms include leg pain. Pertinent negatives include no chest pain, myalgias or shortness of breath. Current antihyperlipidemic treatment includes statins. The current treatment provides moderate improvement of lipids. Risk factors for coronary artery disease include diabetes mellitus, dyslipidemia, hypertension, obesity, post-menopausal and a sedentary lifestyle.  Anxiety Presents for follow-up visit. Symptoms include depressed mood, excessive worry, irritability and nervous/anxious behavior. Patient reports no chest pain, confusion, dizziness, palpitations or shortness of breath. Symptoms occur most days. The severity of symptoms is moderate.   Her past medical history is significant for anxiety/panic attacks, asthma and depression. Past treatments include SSRIs. The treatment provided mild relief. Compliance with prior treatments has been good.  Gastroesophageal Reflux She complains of coughing. She reports no chest pain, no heartburn, no hoarse voice, no sore throat or no wheezing. This is a chronic problem. The current episode started more than 1 year ago. The problem occurs rarely. The problem has been waxing and waning. Associated symptoms include fatigue. Pertinent negatives include no weight loss. She has tried a PPI for the symptoms. The treatment provided mild relief.  Asthma She complains of cough and frequent throat clearing. There is no hoarse voice, shortness of breath or wheezing. This is a chronic problem. The current episode started  more than 1 year ago. The problem occurs intermittently. The problem has been unchanged. The cough is non-productive. Associated symptoms include ear congestion, ear pain, nasal congestion and rhinorrhea. Pertinent negatives include no chest pain, headaches, heartburn, myalgias, sore throat or weight loss. Her  symptoms are alleviated by rest and leukotriene antagonist. She reports moderate improvement on treatment. Her past medical history is significant for asthma.  Thyroid Problem Symptoms include anxiety, depressed mood, diarrhea, dry skin and fatigue. Patient reports no constipation, hoarse voice, palpitations, visual change or weight loss. The symptoms have been worsening. Past treatments include levothyroxine. The treatment provided moderate relief. Her past medical history is significant for diabetes and hyperlipidemia. There is no history of heart failure.      Review of Systems  Constitutional: Positive for irritability and fatigue. Negative for weight loss.  HENT: Positive for ear pain and rhinorrhea. Negative for hoarse voice and sore throat.   Eyes: Negative.  Negative for blurred vision.  Respiratory: Positive for cough. Negative for shortness of breath and wheezing.   Cardiovascular: Negative.  Negative for chest pain and palpitations.  Gastrointestinal: Positive for diarrhea. Negative for heartburn and constipation.  Endocrine: Negative.   Genitourinary: Negative.   Musculoskeletal: Negative.  Negative for myalgias.  Neurological: Negative.  Negative for dizziness and headaches.  Hematological: Negative.   Psychiatric/Behavioral: Negative for confusion. The patient is nervous/anxious.   All other systems reviewed and are negative.      Objective:   Physical Exam  Constitutional: She is oriented to person, place, and time. She appears well-developed and well-nourished. No distress.  HENT:  Head: Normocephalic and atraumatic.  Right Ear: External ear normal.  Left Ear:  External ear normal.  Nose: Nose normal.  Mouth/Throat: Oropharynx is clear and moist.  Eyes: Pupils are equal, round, and reactive to light.  Neck: Normal range of motion. Neck supple. No thyromegaly present.  Cardiovascular: Normal rate, regular rhythm, normal heart sounds and intact distal pulses.   No murmur heard. Pulmonary/Chest: Effort normal and breath sounds normal. No respiratory distress. She has no wheezes.  Abdominal: Soft. Bowel sounds are normal. She exhibits no distension. There is no tenderness.  Musculoskeletal: Normal range of motion. She exhibits no edema or tenderness.  Neurological: She is alert and oriented to person, place, and time. She has normal reflexes. No cranial nerve deficit.  Skin: Skin is warm and dry.  Psychiatric: She has a normal mood and affect. Her behavior is normal. Judgment and thought content normal.  Vitals reviewed.     BP 159/74 mmHg  Pulse 93  Temp(Src) 97.2 F (36.2 C) (Oral)  Ht $R'5\' 5"'fU$  (1.651 m)  Wt 288 lb (130.636 kg)  BMI 47.93 kg/m2     Assessment & Plan:  1. Essential hypertension -Pt's Lasix increased to 20 mg BID from 20 mg daily - CMP14+EGFR - furosemide (LASIX) 20 MG tablet; Take 1 tablet (20 mg total) by mouth 2 (two) times daily.  Dispense: 180 tablet; Refill: 1 - Ambulatory referral to Cardiology  2. Gastroesophageal reflux disease, esophagitis presence not specified - CMP14+EGFR  3. Hypothyroidism, unspecified hypothyroidism type - CMP14+EGFR - Thyroid Panel With TSH  4. Neuropathy (HCC) - CMP14+EGFR  5. Anxiety - CMP14+EGFR  6. Depression - CMP14+EGFR  7. Uncontrolled type 2 diabetes mellitus without complication, with long-term current use of insulin ([facility]) - Ambulatory referral to Ophthalmology - POCT glycosylated hemoglobin (Hb A1C) - CMP14+EGFR  8. Vitamin D deficiency - CMP14+EGFR  9. Obesity, morbid, BMI 40.0-49.9 (HCC) - CMP14+EGFR  10. Hyperlipidemia - CMP14+EGFR - Lipid panel  11.  Gout without tophus, unspecified cause, unspecified chronicity, unspecified site - CMP14+EGFR  12. Allergy, sequela - CMP14+EGFR  13. Encounter for screening mammogram for breast cancer - [facility] - CMP14+EGFR  14. Need for hepatitis C screening test - CMP14+EGFR - Hepatitis C antibody  15. Peripheral edema -Pt's Lasix increased to 20 mg BID from 20 mg daily -Pt told to wear compression hose daily -Pending Cardiologists referral - CMP14+EGFR - furosemide (LASIX) 20 MG tablet; Take 1 tablet (20 mg total) by mouth 2 (two) times daily.  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  Dispense: 120 tablet; Refill: 0  17. Chronic back pain -Ultram increased to 100 mg every 6- hours as needed from 50 mg - traMADol (ULTRAM) 50 MG tablet; Take 2 tablets (100 mg total) by mouth every 6 (six) hours as needed.  Dispense: 120 tablet; Refill: 0   Continue all meds Labs pending Health Maintenance reviewed- Flu vaccine given today Diet and exercise encouraged RTO 3 months  Evelina Dun, FNP

## 2015-08-10 NOTE — Patient Instructions (Signed)
Health Maintenance, Female Adopting a healthy lifestyle and getting preventive care can go a long way to promote health and wellness. Talk with your health care provider about what schedule of regular examinations is right for you. This is a good chance for you to check in with your provider about disease prevention and staying healthy. In between checkups, there are plenty of things you can do on your own. Experts have done a lot of research about which lifestyle changes and preventive measures are most likely to keep you healthy. Ask your health care provider for more information. WEIGHT AND DIET  Eat a healthy diet  Be sure to include plenty of vegetables, fruits, low-fat dairy products, and lean protein.  Do not eat a lot of foods high in solid fats, added sugars, or salt.  Get regular exercise. This is one of the most important things you can do for your health.  Most adults should exercise for at least 150 minutes each week. The exercise should increase your heart rate and make you sweat (moderate-intensity exercise).  Most adults should also do strengthening exercises at least twice a week. This is in addition to the moderate-intensity exercise.  Maintain a healthy weight  Body mass index (BMI) is a measurement that can be used to identify possible weight problems. It estimates body fat based on height and weight. Your health care provider can help determine your BMI and help you achieve or maintain a healthy weight.  For females 20 years of age and older:   A BMI below 18.5 is considered underweight.  A BMI of 18.5 to 24.9 is normal.  A BMI of 25 to 29.9 is considered overweight.  A BMI of 30 and above is considered obese.  Watch levels of cholesterol and blood lipids  You should start having your blood tested for lipids and cholesterol at 64 years of age, then have this test every 5 years.  You may need to have your cholesterol levels checked more often if:  Your lipid  or cholesterol levels are high.  You are older than 64 years of age.  You are at high risk for heart disease.  CANCER SCREENING   Lung Cancer  Lung cancer screening is recommended for adults 55-80 years old who are at high risk for lung cancer because of a history of smoking.  A yearly low-dose CT scan of the lungs is recommended for people who:  Currently smoke.  Have quit within the past 15 years.  Have at least a 30-pack-year history of smoking. A pack year is smoking an average of one pack of cigarettes a day for 1 year.  Yearly screening should continue until it has been 15 years since you quit.  Yearly screening should stop if you develop a health problem that would prevent you from having lung cancer treatment.  Breast Cancer  Practice breast self-awareness. This means understanding how your breasts normally appear and feel.  It also means doing regular breast self-exams. Let your health care provider know about any changes, no matter how small.  If you are in your 20s or 30s, you should have a clinical breast exam (CBE) by a health care provider every 1-3 years as part of a regular health exam.  If you are 40 or older, have a CBE every year. Also consider having a breast X-ray (mammogram) every year.  If you have a family history of breast cancer, talk to your health care provider about genetic screening.  If you   are at high risk for breast cancer, talk to your health care provider about having an MRI and a mammogram every year.  Breast cancer gene (BRCA) assessment is recommended for women who have family members with BRCA-related cancers. BRCA-related cancers include:  Breast.  Ovarian.  Tubal.  Peritoneal cancers.  Results of the assessment will determine the need for genetic counseling and BRCA1 and BRCA2 testing. Cervical Cancer Your health care provider may recommend that you be screened regularly for cancer of the pelvic organs (ovaries, uterus, and  vagina). This screening involves a pelvic examination, including checking for microscopic changes to the surface of your cervix (Pap test). You may be encouraged to have this screening done every 3 years, beginning at age 62.  For women ages 4-65, health care providers may recommend pelvic exams and Pap testing every 3 years, or they may recommend the Pap and pelvic exam, combined with testing for human papilloma virus (HPV), every 5 years. Some types of HPV increase your risk of cervical cancer. Testing for HPV may also be done on women of any age with unclear Pap test results.  Other health care providers may not recommend any screening for nonpregnant women who are considered low risk for pelvic cancer and who do not have symptoms. Ask your health care provider if a screening pelvic exam is right for you.  If you have had past treatment for cervical cancer or a condition that could lead to cancer, you need Pap tests and screening for cancer for at least 20 years after your treatment. If Pap tests have been discontinued, your risk factors (such as having a new sexual partner) need to be reassessed to determine if screening should resume. Some women have medical problems that increase the chance of getting cervical cancer. In these cases, your health care provider may recommend more frequent screening and Pap tests. Colorectal Cancer  This type of cancer can be detected and often prevented.  Routine colorectal cancer screening usually begins at 64 years of age and continues through 64 years of age.  Your health care provider may recommend screening at an earlier age if you have risk factors for colon cancer.  Your health care provider may also recommend using home test kits to check for hidden blood in the stool.  A small camera at the end of a tube can be used to examine your colon directly (sigmoidoscopy or colonoscopy). This is done to check for the earliest forms of colorectal  cancer.  Routine screening usually begins at age 78.  Direct examination of the colon should be repeated every 5-10 years through 64 years of age. However, you may need to be screened more often if early forms of precancerous polyps or small growths are found. Skin Cancer  Check your skin from head to toe regularly.  Tell your health care provider about any new moles or changes in moles, especially if there is a change in a mole's shape or color.  Also tell your health care provider if you have a mole that is larger than the size of a pencil eraser.  Always use sunscreen. Apply sunscreen liberally and repeatedly throughout the day.  Protect yourself by wearing long sleeves, pants, a wide-brimmed hat, and sunglasses whenever you are outside. HEART DISEASE, DIABETES, AND HIGH BLOOD PRESSURE   High blood pressure causes heart disease and increases the risk of stroke. High blood pressure is more likely to develop in:  People who have blood pressure in the high end  of the normal range (130-139/85-89 mm Hg).  People who are overweight or obese.  People who are African American.  If you are 38-23 years of age, have your blood pressure checked every 3-5 years. If you are 61 years of age or older, have your blood pressure checked every year. You should have your blood pressure measured twice--once when you are at a hospital or clinic, and once when you are not at a hospital or clinic. Record the average of the two measurements. To check your blood pressure when you are not at a hospital or clinic, you can use:  An automated blood pressure machine at a pharmacy.  A home blood pressure monitor.  If you are between 45 years and 39 years old, ask your health care provider if you should take aspirin to prevent strokes.  Have regular diabetes screenings. This involves taking a blood sample to check your fasting blood sugar level.  If you are at a normal weight and have a low risk for diabetes,  have this test once every three years after 64 years of age.  If you are overweight and have a high risk for diabetes, consider being tested at a younger age or more often. PREVENTING INFECTION  Hepatitis B  If you have a higher risk for hepatitis B, you should be screened for this virus. You are considered at high risk for hepatitis B if:  You were born in a country where hepatitis B is common. Ask your health care provider which countries are considered high risk.  Your parents were born in a high-risk country, and you have not been immunized against hepatitis B (hepatitis B vaccine).  You have HIV or AIDS.  You use needles to inject street drugs.  You live with someone who has hepatitis B.  You have had sex with someone who has hepatitis B.  You get hemodialysis treatment.  You take certain medicines for conditions, including cancer, organ transplantation, and autoimmune conditions. Hepatitis C  Blood testing is recommended for:  Everyone born from [birth year] through 1965.  Anyone with known risk factors for hepatitis C. Sexually transmitted infections (STIs)  You should be screened for sexually transmitted infections (STIs) including gonorrhea and chlamydia if:  You are sexually active and are younger than 64 years of age.  You are older than 64 years of age and your health care provider tells you that you are at risk for this type of infection.  Your sexual activity has changed since you were last screened and you are at an increased risk for chlamydia or gonorrhea. Ask your health care provider if you are at risk.  If you do not have HIV, but are at risk, it may be recommended that you take a prescription medicine daily to prevent HIV infection. This is called pre-exposure prophylaxis (PrEP). You are considered at risk if:  You are sexually active and do not regularly use condoms or know the HIV status of your partner(s).  You take drugs by injection.  You are sexually  active with a partner who has HIV. Talk with your health care provider about whether you are at high risk of being infected with HIV. If you choose to begin PrEP, you should first be tested for HIV. You should then be tested every 3 months for as long as you are taking PrEP.  PREGNANCY   If you are premenopausal and you may become pregnant, ask your health care provider about preconception counseling.  If you may  become pregnant, take 400 to 800 micrograms (mcg) of folic acid every day.  If you want to prevent pregnancy, talk to your health care provider about birth control (contraception). OSTEOPOROSIS AND MENOPAUSE   Osteoporosis is a disease in which the bones lose minerals and strength with aging. This can result in serious bone fractures. Your risk for osteoporosis can be identified using a bone density scan.  If you are 78 years of age or older, or if you are at risk for osteoporosis and fractures, ask your health care provider if you should be screened.  Ask your health care provider whether you should take a calcium or vitamin D supplement to lower your risk for osteoporosis.  Menopause may have certain physical symptoms and risks.  Hormone replacement therapy may reduce some of these symptoms and risks. Talk to your health care provider about whether hormone replacement therapy is right for you.  HOME CARE INSTRUCTIONS   Schedule regular health, dental, and eye exams.  Stay current with your immunizations.   Do not use any tobacco products including cigarettes, chewing tobacco, or electronic cigarettes.  If you are pregnant, do not drink alcohol.  If you are breastfeeding, limit how much and how often you drink alcohol.  Limit alcohol intake to no more than 1 drink per day for nonpregnant women. One drink equals 12 ounces of beer, 5 ounces of wine, or 1 ounces of hard liquor.  Do not use street drugs.  Do not share needles.  Ask your health care provider for help if  you need support or information about quitting drugs.  Tell your health care provider if you often feel depressed.  Tell your health care provider if you have ever been abused or do not feel safe at home.   This information is not intended to replace advice given to you by your health care provider. Make sure you discuss any questions you have with your health care provider.   Document Released: 03/19/2011 Document Revised: 09/24/2014 Document Reviewed: 08/05/2013 Elsevier Interactive Patient Education [employer].

## 2015-08-11 LAB — LIPID PANEL
CHOL/HDL RATIO: 3 ratio (ref 0.0–4.4)
CHOLESTEROL TOTAL: 136 mg/dL (ref 100–199)
HDL: 46 mg/dL (ref >39)
LDL Calculated: 44 mg/dL (ref 0–99)
TRIGLYCERIDES: 232 mg/dL — AB (ref 0–149)
VLDL Cholesterol Cal: 46 mg/dL — ABNORMAL HIGH (ref 5–40)

## 2015-08-11 LAB — HEPATITIS C ANTIBODY

## 2015-08-11 LAB — THYROID PANEL WITH TSH
FREE THYROXINE INDEX: 2.4 (ref 1.2–4.9)
T3 UPTAKE RATIO: 30 % (ref 24–39)
T4, Total: 8.1 ug/dL (ref 4.5–12.0)
TSH: 2.9 u[IU]/mL (ref 0.450–4.500)

## 2015-08-11 LAB — CMP14+EGFR
A/G RATIO: 1.5 (ref 1.1–2.5)
ALT: 11 IU/L (ref 0–32)
AST: 10 IU/L (ref 0–40)
Albumin: 4.1 g/dL (ref 3.6–4.8)
Alkaline Phosphatase: 98 IU/L (ref 39–117)
BUN/Creatinine Ratio: 27 — ABNORMAL HIGH (ref 11–26)
BUN: 17 mg/dL (ref 8–27)
Bilirubin Total: 0.4 mg/dL (ref 0.0–1.2)
CALCIUM: 10.3 mg/dL (ref 8.7–10.3)
CHLORIDE: 99 mmol/L (ref 97–106)
CO2: 27 mmol/L (ref 18–29)
CREATININE: 0.63 mg/dL (ref 0.57–1.00)
GFR, EST AFRICAN AMERICAN: 110 mL/min/{1.73_m2} (ref >59)
GFR, EST NON AFRICAN AMERICAN: 96 mL/min/{1.73_m2} (ref >59)
Globulin, Total: 2.8 g/dL (ref 1.5–4.5)
Glucose: 57 mg/dL — ABNORMAL LOW (ref 65–99)
POTASSIUM: 4.4 mmol/L (ref 3.5–5.2)
SODIUM: 144 mmol/L (ref 136–144)
TOTAL PROTEIN: 6.9 g/dL (ref 6.0–8.5)

## 2015-08-12 ENCOUNTER — Telehealth: Payer: Self-pay | Admitting: Family

## 2015-08-15 ENCOUNTER — Other Ambulatory Visit: Payer: Self-pay | Admitting: Family

## 2015-08-15 NOTE — Telephone Encounter (Signed)
Patient aware that her vitamin d was not checked this time.

## 2015-09-08 ENCOUNTER — Other Ambulatory Visit: Payer: Self-pay | Admitting: Family

## 2015-09-08 NOTE — Telephone Encounter (Signed)
Last seen 08/10/15  Mary Raymond   If approved print

## 2015-09-08 NOTE — Telephone Encounter (Signed)
RX ready for pick up 

## 2015-09-14 ENCOUNTER — Other Ambulatory Visit: Payer: Self-pay | Admitting: Family

## 2015-09-14 ENCOUNTER — Other Ambulatory Visit: Payer: Self-pay | Admitting: Family Medicine

## 2015-09-15 NOTE — Telephone Encounter (Signed)
Last seen 08/10/15  Mary Raymond  Wanting ABX

## 2015-09-16 ENCOUNTER — Other Ambulatory Visit: Payer: Self-pay | Admitting: Family

## 2015-10-03 ENCOUNTER — Telehealth: Payer: Self-pay | Admitting: Family

## 2015-10-20 ENCOUNTER — Other Ambulatory Visit: Payer: Self-pay | Admitting: Family

## 2015-10-20 NOTE — Telephone Encounter (Signed)
Last seen and filled 08/10/15. Route to pool, call in at [facility]

## 2015-11-10 ENCOUNTER — Other Ambulatory Visit: Payer: Self-pay | Admitting: *Deleted

## 2015-11-10 ENCOUNTER — Other Ambulatory Visit: Payer: Self-pay | Admitting: Family

## 2015-11-10 MED ORDER — TRUEPLUS LANCETS 33G MISC: Status: DC

## 2015-11-10 MED ORDER — GLUCOSE BLOOD VI STRP: ORAL_STRIP | Status: DC

## 2015-11-10 MED ORDER — TRUE METRIX METER W/DEVICE KIT: PACK | Status: DC

## 2015-11-11 ENCOUNTER — Other Ambulatory Visit: Payer: Self-pay | Admitting: Family

## 2015-11-14 ENCOUNTER — Ambulatory Visit (INDEPENDENT_AMBULATORY_CARE_PROVIDER_SITE_OTHER): Payer: Medicare PPO | Admitting: Family

## 2015-11-14 ENCOUNTER — Encounter: Payer: Self-pay | Admitting: Family

## 2015-11-14 VITALS — BP 140/75 | HR 84 | Temp 97.7°F | Ht 65.0 in | Wt 274.0 lb

## 2015-11-14 DIAGNOSIS — M549 Dorsalgia, unspecified: Secondary | ICD-10-CM | POA: Diagnosis not present

## 2015-11-14 DIAGNOSIS — G629 Polyneuropathy, unspecified: Secondary | ICD-10-CM

## 2015-11-14 DIAGNOSIS — Z794 Long term (current) use of insulin: Secondary | ICD-10-CM | POA: Diagnosis not present

## 2015-11-14 DIAGNOSIS — M159 Polyosteoarthritis, unspecified: Secondary | ICD-10-CM

## 2015-11-14 DIAGNOSIS — F32A Depression, unspecified: Secondary | ICD-10-CM

## 2015-11-14 DIAGNOSIS — E1165 Type 2 diabetes mellitus with hyperglycemia: Secondary | ICD-10-CM

## 2015-11-14 DIAGNOSIS — F329 Major depressive disorder, single episode, unspecified: Secondary | ICD-10-CM | POA: Diagnosis not present

## 2015-11-14 DIAGNOSIS — M150 Primary generalized (osteo)arthritis: Secondary | ICD-10-CM

## 2015-11-14 DIAGNOSIS — G8929 Other chronic pain: Secondary | ICD-10-CM

## 2015-11-14 DIAGNOSIS — N3001 Acute cystitis with hematuria: Secondary | ICD-10-CM

## 2015-11-14 DIAGNOSIS — K219 Gastro-esophageal reflux disease without esophagitis: Secondary | ICD-10-CM

## 2015-11-14 DIAGNOSIS — E785 Hyperlipidemia, unspecified: Secondary | ICD-10-CM

## 2015-11-14 DIAGNOSIS — R1030 Lower abdominal pain, unspecified: Secondary | ICD-10-CM

## 2015-11-14 DIAGNOSIS — IMO0001 Reserved for inherently not codable concepts without codable children: Secondary | ICD-10-CM

## 2015-11-14 DIAGNOSIS — J45909 Unspecified asthma, uncomplicated: Secondary | ICD-10-CM

## 2015-11-14 DIAGNOSIS — I10 Essential (primary) hypertension: Secondary | ICD-10-CM | POA: Diagnosis not present

## 2015-11-14 DIAGNOSIS — E559 Vitamin D deficiency, unspecified: Secondary | ICD-10-CM

## 2015-11-14 DIAGNOSIS — T7840XS Allergy, unspecified, sequela: Secondary | ICD-10-CM

## 2015-11-14 DIAGNOSIS — F419 Anxiety disorder, unspecified: Secondary | ICD-10-CM

## 2015-11-14 DIAGNOSIS — M109 Gout, unspecified: Secondary | ICD-10-CM

## 2015-11-14 DIAGNOSIS — E039 Hypothyroidism, unspecified: Secondary | ICD-10-CM | POA: Diagnosis not present

## 2015-11-14 LAB — POCT URINALYSIS DIPSTICK
BILIRUBIN UA: NEGATIVE
GLUCOSE UA: NEGATIVE
Ketones, UA: NEGATIVE
Leukocytes, UA: NEGATIVE
NITRITE UA: NEGATIVE
Protein, UA: NEGATIVE
Spec Grav, UA: >=1.03
Urobilinogen, UA: NEGATIVE
pH, UA: 5

## 2015-11-14 LAB — POCT UA - MICROSCOPIC ONLY
CASTS, UR, LPF, POC: NEGATIVE
Crystals, Ur, HPF, POC: NEGATIVE
MUCUS UA: NEGATIVE
Yeast, UA: NEGATIVE

## 2015-11-14 LAB — POCT GLYCOSYLATED HEMOGLOBIN (HGB A1C): Hemoglobin A1C: 6.6

## 2015-11-14 MED ORDER — ALPRAZOLAM 0.5 MG PO TABS: 0.5000 mg | ORAL_TABLET | Freq: Two times a day (BID) | ORAL | Status: DC | PRN

## 2015-11-14 MED ORDER — LOSARTAN POTASSIUM 100 MG PO TABS: 100.0000 mg | ORAL_TABLET | Freq: Every day | ORAL | Status: DC

## 2015-11-14 MED ORDER — INSULIN LISPRO 100 UNIT/ML ~~LOC~~ SOLN: 45.0000 [IU] | Freq: Three times a day (TID) | SUBCUTANEOUS | Status: DC

## 2015-11-14 MED ORDER — CELECOXIB 200 MG PO CAPS: 200.0000 mg | ORAL_CAPSULE | Freq: Two times a day (BID) | ORAL | Status: DC

## 2015-11-14 MED ORDER — FUROSEMIDE 20 MG PO TABS: 20.0000 mg | ORAL_TABLET | Freq: Every day | ORAL | Status: DC

## 2015-11-14 MED ORDER — LEVOTHYROXINE SODIUM 50 MCG PO TABS: ORAL_TABLET | ORAL | Status: DC

## 2015-11-14 MED ORDER — BUSPIRONE HCL 15 MG PO TABS: ORAL_TABLET | ORAL | Status: DC

## 2015-11-14 MED ORDER — GLUCOSE BLOOD VI STRP: ORAL_STRIP | Status: DC

## 2015-11-14 MED ORDER — METFORMIN HCL 500 MG PO TABS: ORAL_TABLET | ORAL | Status: DC

## 2015-11-14 MED ORDER — AMLODIPINE BESYLATE 10 MG PO TABS: 10.0000 mg | ORAL_TABLET | Freq: Every day | ORAL | Status: DC

## 2015-11-14 MED ORDER — INSULIN PEN NEEDLE 31G X 5 MM MISC: Status: DC

## 2015-11-14 MED ORDER — ONETOUCH DELICA LANCING DEV MISC: Status: DC

## 2015-11-14 MED ORDER — ATORVASTATIN CALCIUM 40 MG PO TABS: 40.0000 mg | ORAL_TABLET | Freq: Every day | ORAL | Status: DC

## 2015-11-14 MED ORDER — INSULIN GLARGINE 100 UNIT/ML SOLOSTAR PEN: 45.0000 [IU] | PEN_INJECTOR | Freq: Two times a day (BID) | SUBCUTANEOUS | Status: DC

## 2015-11-14 MED ORDER — "INSULIN SYRINGE-NEEDLE U-100 31G X 5/16"" 0.5 ML MISC": Status: DC

## 2015-11-14 MED ORDER — SULFAMETHOXAZOLE-TRIMETHOPRIM 800-160 MG PO TABS: 1.0000 | ORAL_TABLET | Freq: Two times a day (BID) | ORAL | Status: DC

## 2015-11-14 NOTE — Progress Notes (Signed)
Subjective:    Patient ID: Mary Raymond, female    DOB: 1951/01/24, 65 y.o.   MRN: [id number]  Pt presents to the office today for chronic follow up. She reports better control over her diet but has been unable to increase her activity level.  Avoiding concentrated sweets and reports lower CBG results. She has not followed up with opthalmology or cardiology as instructed due to financial concerns. Hypertension This is a chronic problem. The current episode started more than 1 year ago. The problem has been gradually improving since onset. The problem is controlled. Associated symptoms include anxiety, blurred vision, peripheral edema (At times) and shortness of breath. Pertinent negatives include no chest pain, headaches or palpitations. Risk factors for coronary artery disease include diabetes mellitus, dyslipidemia, family history, obesity, post-menopausal state and sedentary lifestyle. Past treatments include calcium channel blockers and angiotensin blockers. The current treatment provides mild improvement. Compliance problems include exercise.  Hypertensive end-organ damage includes a thyroid problem. There is no history of kidney disease, CAD/MI, CVA or heart failure. There is no history of sleep apnea.  Diabetes She presents for her follow-up diabetic visit. She has type 2 diabetes mellitus. No MedicAlert identification noted. Her disease course has been stable. Hypoglycemia symptoms include nervousness/anxiousness. Pertinent negatives for hypoglycemia include no confusion, dizziness, headaches or mood changes. Associated symptoms include blurred vision and fatigue. Pertinent negatives for diabetes include no chest pain, no foot paresthesias, no foot ulcerations, no visual change and no weight loss. Hypoglycemia complications include nocturnal hypoglycemia. Symptoms are stable. Diabetic complications include peripheral neuropathy. Pertinent negatives for diabetic complications include no CVA,  heart disease or nephropathy. Risk factors for coronary artery disease include diabetes mellitus, dyslipidemia, hypertension, obesity, post-menopausal, sedentary lifestyle and family history. Current diabetic treatment includes insulin injections and oral agent (dual therapy). She is compliant with treatment all of the time. Her weight is decreasing steadily. She is following a low salt and generally unhealthy diet. Meal planning includes avoidance of concentrated sweets. She never participates in exercise. She monitors blood glucose at home 5+ x per day. Her breakfast blood glucose range is generally 110-130 mg/dl. Her dinner blood glucose range is generally 140-180 mg/dl. An ACE inhibitor/angiotensin II receptor blocker is being taken. Eye exam is not current.  Hyperlipidemia This is a chronic problem. The current episode started more than 1 year ago. The problem is controlled. Recent lipid tests were reviewed and are normal. Exacerbating diseases include diabetes and hypothyroidism. Factors aggravating her hyperlipidemia include fatty foods. Associated symptoms include leg pain and shortness of breath. Pertinent negatives include no chest pain or myalgias. Current antihyperlipidemic treatment includes statins. The current treatment provides moderate improvement of lipids. Compliance problems include adherence to exercise.  Risk factors for coronary artery disease include diabetes mellitus, dyslipidemia, hypertension, obesity, post-menopausal and a sedentary lifestyle.  Anxiety Presents for follow-up visit. Onset was more than 5 years ago. The problem has been gradually worsening. Symptoms include depressed mood, excessive worry, irritability, nervous/anxious behavior, panic and shortness of breath. Patient reports no chest pain, confusion, dizziness, nausea or palpitations. Symptoms occur most days. The severity of symptoms is moderate. The symptoms are aggravated by family issues. The quality of sleep is  good.   Her past medical history is significant for anxiety/panic attacks, asthma and depression. Past treatments include SSRIs. The treatment provided mild relief. Compliance with prior treatments has been good.  Gastroesophageal Reflux She complains of coughing. She reports no chest pain, no heartburn, no hoarse voice, no nausea,  no sore throat or no wheezing. This is a chronic problem. The current episode started more than 1 year ago. The problem occurs rarely. The problem has been waxing and waning. The symptoms are aggravated by certain foods. Associated symptoms include fatigue. Pertinent negatives include no weight loss. Risk factors include lack of exercise and caffeine use. She has tried a PPI for the symptoms. The treatment provided mild relief.  Asthma She complains of cough, frequent throat clearing and shortness of breath. There is no hoarse voice, sputum production or wheezing. This is a chronic problem. The current episode started more than 1 year ago. The problem occurs intermittently. The problem has been unchanged. The cough is non-productive. Associated symptoms include appetite change, ear congestion, ear pain, nasal congestion and rhinorrhea. Pertinent negatives include no chest pain, headaches, heartburn, myalgias, sore throat or weight loss. Her symptoms are aggravated by exercise. Her symptoms are alleviated by rest and leukotriene antagonist. She reports moderate improvement on treatment. Her past medical history is significant for asthma.  Thyroid Problem Symptoms include anxiety, depressed mood, dry skin and fatigue. Patient reports no constipation, diarrhea, hoarse voice, palpitations, visual change or weight loss. The symptoms have been worsening. Past treatments include levothyroxine. The treatment provided moderate relief. Her past medical history is significant for diabetes and hyperlipidemia. There is no history of heart failure.  Dysuria  This is a new problem. The current  episode started in the past 7 days. The problem has been rapidly improving. The quality of the pain is described as aching. The pain is at a severity of 4/10. The pain is mild. Associated symptoms include frequency. Pertinent negatives include no chills, discharge, hematuria or nausea. She has tried antibiotics and increased fluids (Pt has a "couple of days of cipro left and took those") for the symptoms. The treatment provided moderate relief.      Review of Systems  Constitutional: Positive for appetite change, irritability and fatigue. Negative for chills and weight loss.  HENT: Positive for ear pain and rhinorrhea. Negative for hoarse voice and sore throat.   Eyes: Positive for blurred vision and visual disturbance.  Respiratory: Positive for cough and shortness of breath. Negative for sputum production and wheezing.   Cardiovascular: Positive for leg swelling. Negative for chest pain and palpitations.  Gastrointestinal: Negative for heartburn, nausea, diarrhea and constipation.  Endocrine: Negative.   Genitourinary: Positive for dysuria and frequency. Negative for hematuria.  Musculoskeletal: Negative.  Negative for myalgias.  Neurological: Negative.  Negative for dizziness and headaches.  Hematological: Negative.   Psychiatric/Behavioral: Negative for confusion. The patient is nervous/anxious.   All other systems reviewed and are negative.      Objective:   Physical Exam  Constitutional: She is oriented to person, place, and time. She appears well-developed and well-nourished. No distress.  HENT:  Head: Normocephalic and atraumatic.  Right Ear: Tympanic membrane and external ear normal.  Left Ear: Tympanic membrane and external ear normal.  Nose: Mucosal edema and rhinorrhea present. Right sinus exhibits no maxillary sinus tenderness and no frontal sinus tenderness. Left sinus exhibits no maxillary sinus tenderness and no frontal sinus tenderness.  Mouth/Throat: Uvula is midline.  Posterior oropharyngeal erythema present.  Eyes: Conjunctivae, EOM and lids are normal. Pupils are equal, round, and reactive to light.  Neck: Normal range of motion. Neck supple. Decreased carotid pulses present. Carotid bruit is not present. No thyromegaly present.  Cardiovascular: Normal rate, regular rhythm, normal heart sounds and intact distal pulses.   No murmur heard.  Pulmonary/Chest: Effort normal and breath sounds normal. No respiratory distress. She has no wheezes.  Abdominal: Soft. Bowel sounds are normal. She exhibits no distension. There is no tenderness.  Musculoskeletal: Normal range of motion. She exhibits no edema or tenderness.  Lymphadenopathy:       Head (right side): No submental, no submandibular and no posterior auricular adenopathy present.       Head (left side): No submental, no submandibular and no posterior auricular adenopathy present.    She has no cervical adenopathy.  Neurological: She is alert and oriented to person, place, and time. She has normal reflexes. No cranial nerve deficit.  Skin: Skin is warm and dry.  Psychiatric: She has a normal mood and affect. Her behavior is normal. Judgment and thought content normal.  Vitals reviewed.     BP 140/75 mmHg  Pulse 84  Temp(Src) 97.7 F (36.5 C) (Oral)  Ht '5\' 5"'$  (1.651 m)  Wt 274 lb (124.286 kg)  BMI 45.60 kg/m2     Assessment & Plan:  1. Lower abdominal pain - POCT UA - Microscopic Only - POCT urinalysis dipstick - CMP14+EGFR  2. Essential hypertension - CMP14+EGFR  3. Asthma, unspecified asthma severity, uncomplicated - FTD32+KGUR  4. Gastroesophageal reflux disease, esophagitis presence not specified - CMP14+EGFR  5. Hypothyroidism, unspecified hypothyroidism type - CMP14+EGFR - Thyroid Panel With TSH  6. Neuropathy (HCC) - CMP14+EGFR  7. Primary osteoarthritis involving multiple joints -Pt started on Celebrex today - CMP14+EGFR - celecoxib (CELEBREX) 200 MG capsule; Take 1  capsule (200 mg total) by mouth 2 (two) times daily.  Dispense: 180 capsule; Refill: 1  8. Hyperlipidemia - CMP14+EGFR - Lipid panel  9. Uncontrolled type 2 diabetes mellitus without complication, with long-term current use of insulin ([facility]) - Ambulatory referral to Ophthalmology - POCT glycosylated hemoglobin (Hb A1C) - CMP14+EGFR  10. Obesity, morbid, BMI 40.0-49.9 (HCC) - CMP14+EGFR  11. Chronic back pain - CMP14+EGFR  12. Depression - CMP14+EGFR  13. Gout without tophus, unspecified cause, unspecified chronicity, unspecified site - CMP14+EGFR  14. Vitamin D deficiency - CMP14+EGFR - VITAMIN D 25 Hydroxy (Vit-D Deficiency, Fractures)  15. Allergy, sequela - CMP14+EGFR  16. Anxiety - ALPRAZolam (XANAX) 0.5 MG tablet; Take 1 tablet (0.5 mg total) by mouth 2 (two) times daily as needed. for anxiety  Dispense: 45 tablet; Refill: 2 - CMP14+EGFR  17. Acute cystitis with hematuria - sulfamethoxazole-trimethoprim (BACTRIM DS) 800-160 MG tablet; Take 1 tablet by mouth 2 (two) times daily.  Dispense: 14 tablet; Refill: 0   Continue all meds Labs pending Health Maintenance reviewed Diet and exercise encouraged RTO 3 months  Evelina Dun, FNP

## 2015-11-14 NOTE — Addendum Note (Signed)
Addended by: Pollyann Kennedy F on: 11/14/2015 02:30 PM   Modules accepted: Orders, SmartSet

## 2015-11-14 NOTE — Patient Instructions (Signed)
Health Maintenance, Female Adopting a healthy lifestyle and getting preventive care can go a long way to promote health and wellness. Talk with your health care provider about what schedule of regular examinations is right for you. This is a good chance for you to check in with your provider about disease prevention and staying healthy. In between checkups, there are plenty of things you can do on your own. Experts have done a lot of research about which lifestyle changes and preventive measures are most likely to keep you healthy. Ask your health care provider for more information. WEIGHT AND DIET  Eat a healthy diet  Be sure to include plenty of vegetables, fruits, low-fat dairy products, and lean protein.  Do not eat a lot of foods high in solid fats, added sugars, or salt.  Get regular exercise. This is one of the most important things you can do for your health.  Most adults should exercise for at least 150 minutes each week. The exercise should increase your heart rate and make you sweat (moderate-intensity exercise).  Most adults should also do strengthening exercises at least twice a week. This is in addition to the moderate-intensity exercise.  Maintain a healthy weight  Body mass index (BMI) is a measurement that can be used to identify possible weight problems. It estimates body fat based on height and weight. Your health care provider can help determine your BMI and help you achieve or maintain a healthy weight.  For females 20 years of age and older:   A BMI below 18.5 is considered underweight.  A BMI of 18.5 to 24.9 is normal.  A BMI of 25 to 29.9 is considered overweight.  A BMI of 30 and above is considered obese.  Watch levels of cholesterol and blood lipids  You should start having your blood tested for lipids and cholesterol at 65 years of age, then have this test every 5 years.  You may need to have your cholesterol levels checked more often if:  Your lipid  or cholesterol levels are high.  You are older than 65 years of age.  You are at high risk for heart disease.  CANCER SCREENING   Lung Cancer  Lung cancer screening is recommended for adults 55-80 years old who are at high risk for lung cancer because of a history of smoking.  A yearly low-dose CT scan of the lungs is recommended for people who:  Currently smoke.  Have quit within the past 15 years.  Have at least a 30-pack-year history of smoking. A pack year is smoking an average of one pack of cigarettes a day for 1 year.  Yearly screening should continue until it has been 15 years since you quit.  Yearly screening should stop if you develop a health problem that would prevent you from having lung cancer treatment.  Breast Cancer  Practice breast self-awareness. This means understanding how your breasts normally appear and feel.  It also means doing regular breast self-exams. Let your health care provider know about any changes, no matter how small.  If you are in your 20s or 30s, you should have a clinical breast exam (CBE) by a health care provider every 1-3 years as part of a regular health exam.  If you are 40 or older, have a CBE every year. Also consider having a breast X-ray (mammogram) every year.  If you have a family history of breast cancer, talk to your health care provider about genetic screening.  If you   are at high risk for breast cancer, talk to your health care provider about having an MRI and a mammogram every year.  Breast cancer gene (BRCA) assessment is recommended for women who have family members with BRCA-related cancers. BRCA-related cancers include:  Breast.  Ovarian.  Tubal.  Peritoneal cancers.  Results of the assessment will determine the need for genetic counseling and BRCA1 and BRCA2 testing. Cervical Cancer Your health care provider may recommend that you be screened regularly for cancer of the pelvic organs (ovaries, uterus, and  vagina). This screening involves a pelvic examination, including checking for microscopic changes to the surface of your cervix (Pap test). You may be encouraged to have this screening done every 3 years, beginning at age 21.  For women ages 30-65, health care providers may recommend pelvic exams and Pap testing every 3 years, or they may recommend the Pap and pelvic exam, combined with testing for human papilloma virus (HPV), every 5 years. Some types of HPV increase your risk of cervical cancer. Testing for HPV may also be done on women of any age with unclear Pap test results.  Other health care providers may not recommend any screening for nonpregnant women who are considered low risk for pelvic cancer and who do not have symptoms. Ask your health care provider if a screening pelvic exam is right for you.  If you have had past treatment for cervical cancer or a condition that could lead to cancer, you need Pap tests and screening for cancer for at least 20 years after your treatment. If Pap tests have been discontinued, your risk factors (such as having a new sexual partner) need to be reassessed to determine if screening should resume. Some women have medical problems that increase the chance of getting cervical cancer. In these cases, your health care provider may recommend more frequent screening and Pap tests. Colorectal Cancer  This type of cancer can be detected and often prevented.  Routine colorectal cancer screening usually begins at 65 years of age and continues through 65 years of age.  Your health care provider may recommend screening at an earlier age if you have risk factors for colon cancer.  Your health care provider may also recommend using home test kits to check for hidden blood in the stool.  A small camera at the end of a tube can be used to examine your colon directly (sigmoidoscopy or colonoscopy). This is done to check for the earliest forms of colorectal  cancer.  Routine screening usually begins at age 50.  Direct examination of the colon should be repeated every 5-10 years through 65 years of age. However, you may need to be screened more often if early forms of precancerous polyps or small growths are found. Skin Cancer  Check your skin from head to toe regularly.  Tell your health care provider about any new moles or changes in moles, especially if there is a change in a mole's shape or color.  Also tell your health care provider if you have a mole that is larger than the size of a pencil eraser.  Always use sunscreen. Apply sunscreen liberally and repeatedly throughout the day.  Protect yourself by wearing long sleeves, pants, a wide-brimmed hat, and sunglasses whenever you are outside. HEART DISEASE, DIABETES, AND HIGH BLOOD PRESSURE   High blood pressure causes heart disease and increases the risk of stroke. High blood pressure is more likely to develop in:  People who have blood pressure in the high end   of the normal range (130-139/85-89 mm Hg).  People who are overweight or obese.  People who are African American.  If you are 38-23 years of age, have your blood pressure checked every 3-5 years. If you are 61 years of age or older, have your blood pressure checked every year. You should have your blood pressure measured twice--once when you are at a hospital or clinic, and once when you are not at a hospital or clinic. Record the average of the two measurements. To check your blood pressure when you are not at a hospital or clinic, you can use:  An automated blood pressure machine at a pharmacy.  A home blood pressure monitor.  If you are between 45 years and 39 years old, ask your health care provider if you should take aspirin to prevent strokes.  Have regular diabetes screenings. This involves taking a blood sample to check your fasting blood sugar level.  If you are at a normal weight and have a low risk for diabetes,  have this test once every three years after 65 years of age.  If you are overweight and have a high risk for diabetes, consider being tested at a younger age or more often. PREVENTING INFECTION  Hepatitis B  If you have a higher risk for hepatitis B, you should be screened for this virus. You are considered at high risk for hepatitis B if:  You were born in a country where hepatitis B is common. Ask your health care provider which countries are considered high risk.  Your parents were born in a high-risk country, and you have not been immunized against hepatitis B (hepatitis B vaccine).  You have HIV or AIDS.  You use needles to inject street drugs.  You live with someone who has hepatitis B.  You have had sex with someone who has hepatitis B.  You get hemodialysis treatment.  You take certain medicines for conditions, including cancer, organ transplantation, and autoimmune conditions. Hepatitis C  Blood testing is recommended for:  Everyone born from [birth year] through 1965.  Anyone with known risk factors for hepatitis C. Sexually transmitted infections (STIs)  You should be screened for sexually transmitted infections (STIs) including gonorrhea and chlamydia if:  You are sexually active and are younger than 65 years of age.  You are older than 65 years of age and your health care provider tells you that you are at risk for this type of infection.  Your sexual activity has changed since you were last screened and you are at an increased risk for chlamydia or gonorrhea. Ask your health care provider if you are at risk.  If you do not have HIV, but are at risk, it may be recommended that you take a prescription medicine daily to prevent HIV infection. This is called pre-exposure prophylaxis (PrEP). You are considered at risk if:  You are sexually active and do not regularly use condoms or know the HIV status of your partner(s).  You take drugs by injection.  You are sexually  active with a partner who has HIV. Talk with your health care provider about whether you are at high risk of being infected with HIV. If you choose to begin PrEP, you should first be tested for HIV. You should then be tested every 3 months for as long as you are taking PrEP.  PREGNANCY   If you are premenopausal and you may become pregnant, ask your health care provider about preconception counseling.  If you may  become pregnant, take 400 to 800 micrograms (mcg) of folic acid every day.  If you want to prevent pregnancy, talk to your health care provider about birth control (contraception). OSTEOPOROSIS AND MENOPAUSE   Osteoporosis is a disease in which the bones lose minerals and strength with aging. This can result in serious bone fractures. Your risk for osteoporosis can be identified using a bone density scan.  If you are 75 years of age or older, or if you are at risk for osteoporosis and fractures, ask your health care provider if you should be screened.  Ask your health care provider whether you should take a calcium or vitamin D supplement to lower your risk for osteoporosis.  Menopause may have certain physical symptoms and risks.  Hormone replacement therapy may reduce some of these symptoms and risks. Talk to your health care provider about whether hormone replacement therapy is right for you.  HOME CARE INSTRUCTIONS   Schedule regular health, dental, and eye exams.  Stay current with your immunizations.   Do not use any tobacco products including cigarettes, chewing tobacco, or electronic cigarettes.  If you are pregnant, do not drink alcohol.  If you are breastfeeding, limit how much and how often you drink alcohol.  Limit alcohol intake to no more than 1 drink per day for nonpregnant women. One drink equals 12 ounces of beer, 5 ounces of wine, or 1 ounces of hard liquor.  Do not use street drugs.  Do not share needles.  Ask your health care provider for help if  you need support or information about quitting drugs.  Tell your health care provider if you often feel depressed.  Tell your health care provider if you have ever been abused or do not feel safe at home.   This information is not intended to replace advice given to you by your health care provider. Make sure you discuss any questions you have with your health care provider.   Document Released: 03/19/2011 Document Revised: 09/24/2014 Document Reviewed: 08/05/2013 Elsevier Interactive Patient Education 2016 Elsevier Inc. Osteoarthritis Osteoarthritis is a disease that causes soreness and inflammation of a joint. It occurs when the cartilage at the affected joint wears down. Cartilage acts as a cushion, covering the ends of bones where they meet to form a joint. Osteoarthritis is the most common form of arthritis. It often occurs in older people. The joints affected most often by this condition include those in the:  Ends of the fingers.  Thumbs.  Neck.  Lower back.  Knees.  Hips. CAUSES  Over time, the cartilage that covers the ends of bones begins to wear away. This causes bone to rub on bone, producing pain and stiffness in the affected joints.  RISK FACTORS Certain factors can increase your chances of having osteoarthritis, including:  Older age.  Excessive body weight.  Overuse of joints.  Previous joint injury. SIGNS AND SYMPTOMS   Pain, swelling, and stiffness in the joint.  Over time, the joint may lose its normal shape.  Small deposits of bone (osteophytes) may grow on the edges of the joint.  Bits of bone or cartilage can break off and float inside the joint space. This may cause more pain and damage. DIAGNOSIS  Your health care provider will do a physical exam and ask about your symptoms. Various tests may be ordered, such as:  X-rays of the affected joint.  Blood tests to rule out other types of arthritis. Additional tests may be used to diagnose your  condition. TREATMENT  Goals of treatment are to control pain and improve joint function. Treatment plans may include:  A prescribed exercise program that allows for rest and joint relief.  A weight control plan.  Pain relief techniques, such as:  Properly applied heat and cold.  Electric pulses delivered to nerve endings under the skin (transcutaneous electrical nerve stimulation [TENS]).  Massage.  Certain nutritional supplements.  Medicines to control pain, such as:  Acetaminophen.  Nonsteroidal anti-inflammatory drugs (NSAIDs), such as naproxen.  Narcotic or central-acting agents, such as tramadol.  Corticosteroids. These can be given orally or as an injection.  Surgery to reposition the bones and relieve pain (osteotomy) or to remove loose pieces of bone and cartilage. Joint replacement may be needed in advanced states of osteoarthritis. HOME CARE INSTRUCTIONS   Take medicines only as directed by your health care provider.  Maintain a healthy weight. Follow your health care provider's instructions for weight control. This may include dietary instructions.  Exercise as directed. Your health care provider can recommend specific types of exercise. These may include:  Strengthening exercises. These are done to strengthen the muscles that support joints affected by arthritis. They can be performed with weights or with exercise bands to add resistance.  Aerobic activities. These are exercises, such as brisk walking or low-impact aerobics, that get your heart pumping.  Range-of-motion activities. These keep your joints limber.  Balance and agility exercises. These help you maintain daily living skills.  Rest your affected joints as directed by your health care provider.  Keep all follow-up visits as directed by your health care provider. SEEK MEDICAL CARE IF:   Your skin turns red.  You develop a rash in addition to your joint pain.  You have worsening joint  pain.  You have a fever along with joint or muscle aches. SEEK IMMEDIATE MEDICAL CARE IF:  You have a significant loss of weight or appetite.  You have night sweats. [place] of Arthritis and Musculoskeletal and Skin Diseases: www.niams.SouthExposed.es  [employer] on Aging: http://kim-miller.com/  American College of Rheumatology: www.rheumatology.org   This information is not intended to replace advice given to you by your health care provider. Make sure you discuss any questions you have with your health care provider.   Document Released: 09/03/2005 Document Revised: 09/24/2014 Document Reviewed: 05/11/2013 Elsevier Interactive Patient Education [employer].

## 2015-11-15 LAB — LIPID PANEL
CHOL/HDL RATIO: 3.7 ratio (ref 0.0–4.4)
Cholesterol, Total: 168 mg/dL (ref 100–199)
HDL: 45 mg/dL (ref >39)
LDL Calculated: 74 mg/dL (ref 0–99)
Triglycerides: 245 mg/dL — ABNORMAL HIGH (ref 0–149)
VLDL Cholesterol Cal: 49 mg/dL — ABNORMAL HIGH (ref 5–40)

## 2015-11-15 LAB — CMP14+EGFR
A/G RATIO: 1.5 (ref 1.1–2.5)
ALK PHOS: 116 IU/L (ref 39–117)
ALT: 23 IU/L (ref 0–32)
AST: 20 IU/L (ref 0–40)
Albumin: 4 g/dL (ref 3.6–4.8)
BILIRUBIN TOTAL: 0.3 mg/dL (ref 0.0–1.2)
BUN/Creatinine Ratio: 29 — ABNORMAL HIGH (ref 11–26)
BUN: 18 mg/dL (ref 8–27)
CO2: 25 mmol/L (ref 18–29)
Calcium: 9.6 mg/dL (ref 8.7–10.3)
Chloride: 99 mmol/L (ref 96–106)
Creatinine, Ser: 0.63 mg/dL (ref 0.57–1.00)
GFR, EST AFRICAN AMERICAN: 110 mL/min/{1.73_m2} (ref >59)
GFR, EST NON AFRICAN AMERICAN: 95 mL/min/{1.73_m2} (ref >59)
Globulin, Total: 2.7 g/dL (ref 1.5–4.5)
Glucose: 112 mg/dL — ABNORMAL HIGH (ref 65–99)
POTASSIUM: 5 mmol/L (ref 3.5–5.2)
SODIUM: 142 mmol/L (ref 134–144)
TOTAL PROTEIN: 6.7 g/dL (ref 6.0–8.5)

## 2015-11-15 LAB — THYROID PANEL WITH TSH
FREE THYROXINE INDEX: 2.4 (ref 1.2–4.9)
T3 Uptake Ratio: 29 % (ref 24–39)
T4, Total: 8.3 ug/dL (ref 4.5–12.0)
TSH: 2.9 u[IU]/mL (ref 0.450–4.500)

## 2015-11-15 LAB — VITAMIN D 25 HYDROXY (VIT D DEFICIENCY, FRACTURES): Vit D, 25-Hydroxy: 21.2 ng/mL — ABNORMAL LOW (ref 30.0–100.0)

## 2015-11-16 ENCOUNTER — Other Ambulatory Visit: Payer: Self-pay | Admitting: Family

## 2015-11-16 LAB — URINE CULTURE

## 2015-11-18 ENCOUNTER — Other Ambulatory Visit: Payer: Self-pay | Admitting: *Deleted

## 2015-11-18 MED ORDER — ALBUTEROL SULFATE HFA 108 (90 BASE) MCG/ACT IN AERS: 2.0000 | INHALATION_SPRAY | RESPIRATORY_TRACT | Status: DC | PRN

## 2015-11-18 MED ORDER — BUDESONIDE-FORMOTEROL FUMARATE 160-4.5 MCG/ACT IN AERO: INHALATION_SPRAY | RESPIRATORY_TRACT | Status: DC

## 2015-11-21 ENCOUNTER — Telehealth: Payer: Self-pay | Admitting: Family

## 2015-11-21 ENCOUNTER — Other Ambulatory Visit: Payer: Self-pay | Admitting: Family

## 2015-11-21 NOTE — Telephone Encounter (Signed)
Patient said that her insurance is no longer willing to pay for her Humalog and that they have faxed [place] a request for approval of another medication instead but they have not received a reply from [place].  The patient is unsure of what that medication is.  She wants to know if you have received that request or know anything about it.

## 2015-11-22 NOTE — Telephone Encounter (Signed)
New rx faxed

## 2015-11-23 ENCOUNTER — Other Ambulatory Visit: Payer: Self-pay

## 2015-11-23 DIAGNOSIS — Z794 Long term (current) use of insulin: Principal | ICD-10-CM

## 2015-11-23 DIAGNOSIS — IMO0001 Reserved for inherently not codable concepts without codable children: Secondary | ICD-10-CM

## 2015-11-23 DIAGNOSIS — E1165 Type 2 diabetes mellitus with hyperglycemia: Principal | ICD-10-CM

## 2015-11-23 MED ORDER — INSULIN PEN NEEDLE 31G X 5 MM MISC: Status: DC

## 2015-11-30 ENCOUNTER — Telehealth: Payer: Self-pay | Admitting: Family

## 2015-11-30 MED ORDER — BENZONATATE 200 MG PO CAPS: 200.0000 mg | ORAL_CAPSULE | Freq: Three times a day (TID) | ORAL | Status: DC | PRN

## 2015-11-30 NOTE — Telephone Encounter (Signed)
Pt aware of lab results from 2/27. Would like cough med sent into [facility], when she gets a cold she keeps a cough. This episode started after last visit.

## 2015-11-30 NOTE — Telephone Encounter (Signed)
Tessalon Perles Prescription sent to pharmacy

## 2015-12-01 ENCOUNTER — Telehealth: Payer: Self-pay | Admitting: Family

## 2015-12-01 ENCOUNTER — Other Ambulatory Visit: Payer: Self-pay | Admitting: *Deleted

## 2015-12-01 DIAGNOSIS — Z794 Long term (current) use of insulin: Principal | ICD-10-CM

## 2015-12-01 DIAGNOSIS — E1165 Type 2 diabetes mellitus with hyperglycemia: Principal | ICD-10-CM

## 2015-12-01 DIAGNOSIS — IMO0001 Reserved for inherently not codable concepts without codable children: Secondary | ICD-10-CM

## 2015-12-01 MED ORDER — INSULIN LISPRO 100 UNIT/ML ~~LOC~~ SOLN: 45.0000 [IU] | Freq: Three times a day (TID) | SUBCUTANEOUS | Status: DC

## 2015-12-01 NOTE — Telephone Encounter (Signed)
Patient aware.

## 2015-12-13 MED ORDER — INSULIN ASPART 100 UNIT/ML FLEXPEN: 45.0000 [IU] | PEN_INJECTOR | Freq: Three times a day (TID) | SUBCUTANEOUS | Status: DC

## 2015-12-13 NOTE — Telephone Encounter (Signed)
Verbal order given  

## 2016-01-06 ENCOUNTER — Other Ambulatory Visit: Payer: Self-pay | Admitting: Family

## 2016-01-09 ENCOUNTER — Other Ambulatory Visit: Payer: Self-pay | Admitting: Family

## 2016-01-10 ENCOUNTER — Other Ambulatory Visit: Payer: Self-pay | Admitting: Family

## 2016-02-08 ENCOUNTER — Other Ambulatory Visit: Payer: Self-pay | Admitting: *Deleted

## 2016-02-08 DIAGNOSIS — J45909 Unspecified asthma, uncomplicated: Secondary | ICD-10-CM

## 2016-02-08 MED ORDER — IPRATROPIUM-ALBUTEROL 18-103 MCG/ACT IN AERO: 2.0000 | INHALATION_SPRAY | RESPIRATORY_TRACT | Status: DC | PRN

## 2016-02-24 ENCOUNTER — Other Ambulatory Visit: Payer: Self-pay | Admitting: Family

## 2016-02-29 ENCOUNTER — Other Ambulatory Visit: Payer: Self-pay | Admitting: *Deleted

## 2016-02-29 DIAGNOSIS — IMO0001 Reserved for inherently not codable concepts without codable children: Secondary | ICD-10-CM

## 2016-02-29 DIAGNOSIS — Z794 Long term (current) use of insulin: Principal | ICD-10-CM

## 2016-02-29 DIAGNOSIS — E1165 Type 2 diabetes mellitus with hyperglycemia: Principal | ICD-10-CM

## 2016-03-01 ENCOUNTER — Ambulatory Visit (INDEPENDENT_AMBULATORY_CARE_PROVIDER_SITE_OTHER): Payer: Medicare PPO | Admitting: Family

## 2016-03-01 ENCOUNTER — Encounter: Payer: Self-pay | Admitting: Family

## 2016-03-01 ENCOUNTER — Other Ambulatory Visit: Payer: Self-pay | Admitting: Family

## 2016-03-01 VITALS — BP 139/74 | HR 93 | Temp 97.0°F | Ht 65.0 in | Wt 285.4 lb

## 2016-03-01 DIAGNOSIS — Z114 Encounter for screening for human immunodeficiency virus [HIV]: Secondary | ICD-10-CM

## 2016-03-01 DIAGNOSIS — M150 Primary generalized (osteo)arthritis: Secondary | ICD-10-CM | POA: Diagnosis not present

## 2016-03-01 DIAGNOSIS — I10 Essential (primary) hypertension: Secondary | ICD-10-CM | POA: Diagnosis not present

## 2016-03-01 DIAGNOSIS — E1165 Type 2 diabetes mellitus with hyperglycemia: Secondary | ICD-10-CM | POA: Diagnosis not present

## 2016-03-01 DIAGNOSIS — E559 Vitamin D deficiency, unspecified: Secondary | ICD-10-CM

## 2016-03-01 DIAGNOSIS — E785 Hyperlipidemia, unspecified: Secondary | ICD-10-CM | POA: Diagnosis not present

## 2016-03-01 DIAGNOSIS — E039 Hypothyroidism, unspecified: Secondary | ICD-10-CM | POA: Diagnosis not present

## 2016-03-01 DIAGNOSIS — G629 Polyneuropathy, unspecified: Secondary | ICD-10-CM

## 2016-03-01 DIAGNOSIS — J45909 Unspecified asthma, uncomplicated: Secondary | ICD-10-CM | POA: Diagnosis not present

## 2016-03-01 DIAGNOSIS — F32A Depression, unspecified: Secondary | ICD-10-CM

## 2016-03-01 DIAGNOSIS — M549 Dorsalgia, unspecified: Secondary | ICD-10-CM

## 2016-03-01 DIAGNOSIS — J0100 Acute maxillary sinusitis, unspecified: Secondary | ICD-10-CM

## 2016-03-01 DIAGNOSIS — Z794 Long term (current) use of insulin: Secondary | ICD-10-CM

## 2016-03-01 DIAGNOSIS — M159 Polyosteoarthritis, unspecified: Secondary | ICD-10-CM

## 2016-03-01 DIAGNOSIS — F419 Anxiety disorder, unspecified: Secondary | ICD-10-CM | POA: Diagnosis not present

## 2016-03-01 DIAGNOSIS — K219 Gastro-esophageal reflux disease without esophagitis: Secondary | ICD-10-CM | POA: Diagnosis not present

## 2016-03-01 DIAGNOSIS — IMO0001 Reserved for inherently not codable concepts without codable children: Secondary | ICD-10-CM

## 2016-03-01 DIAGNOSIS — G8929 Other chronic pain: Secondary | ICD-10-CM

## 2016-03-01 DIAGNOSIS — F329 Major depressive disorder, single episode, unspecified: Secondary | ICD-10-CM | POA: Diagnosis not present

## 2016-03-01 DIAGNOSIS — Z1239 Encounter for other screening for malignant neoplasm of breast: Secondary | ICD-10-CM

## 2016-03-01 LAB — BAYER DCA HB A1C WAIVED: HB A1C (BAYER DCA - WAIVED): 6.8 % (ref <7.0)

## 2016-03-01 MED ORDER — ESCITALOPRAM OXALATE 10 MG PO TABS: 10.0000 mg | ORAL_TABLET | Freq: Every day | ORAL | Status: DC

## 2016-03-01 MED ORDER — AMOXICILLIN-POT CLAVULANATE 875-125 MG PO TABS: 1.0000 | ORAL_TABLET | Freq: Two times a day (BID) | ORAL | Status: DC

## 2016-03-01 MED ORDER — ALPRAZOLAM 0.5 MG PO TABS: 0.5000 mg | ORAL_TABLET | Freq: Two times a day (BID) | ORAL | Status: DC | PRN

## 2016-03-01 MED ORDER — BUDESONIDE-FORMOTEROL FUMARATE 160-4.5 MCG/ACT IN AERO: INHALATION_SPRAY | RESPIRATORY_TRACT | Status: DC

## 2016-03-01 NOTE — Patient Instructions (Signed)
Asthma, Adult Asthma is a recurring condition in which the airways tighten and narrow. Asthma can make it difficult to breathe. It can cause coughing, wheezing, and shortness of breath. Asthma episodes, also called asthma attacks, range from minor to life-threatening. Asthma cannot be cured, but medicines and lifestyle changes can help control it. CAUSES Asthma is believed to be caused by inherited (genetic) and environmental factors, but its exact cause is unknown. Asthma may be triggered by allergens, lung infections, or irritants in the air. Asthma triggers are different for each person. Common triggers include:   Animal dander.  Dust mites.  Cockroaches.  Pollen from trees or grass.  Mold.  Smoke.  Air pollutants such as dust, household cleaners, hair sprays, aerosol sprays, paint fumes, strong chemicals, or strong odors.  Cold air, weather changes, and winds (which increase molds and pollens in the air).  Strong emotional expressions such as crying or laughing hard.  Stress.  Certain medicines (such as aspirin) or types of drugs (such as beta-blockers).  Sulfites in foods and drinks. Foods and drinks that may contain sulfites include dried fruit, potato chips, and sparkling grape juice.  Infections or inflammatory conditions such as the flu, a cold, or an inflammation of the nasal membranes (rhinitis).  Gastroesophageal reflux disease (GERD).  Exercise or strenuous activity. SYMPTOMS Symptoms may occur immediately after asthma is triggered or many hours later. Symptoms include:  Wheezing.  Excessive nighttime or early morning coughing.  Frequent or severe coughing with a common cold.  Chest tightness.  Shortness of breath. DIAGNOSIS  The diagnosis of asthma is made by a review of your medical history and a physical exam. Tests may also be performed. These may include:  Lung function studies. These tests show how much air you breathe in and out.  Allergy  tests.  Imaging tests such as X-rays. TREATMENT  Asthma cannot be cured, but it can usually be controlled. Treatment involves identifying and avoiding your asthma triggers. It also involves medicines. There are 2 classes of medicine used for asthma treatment:   Controller medicines. These prevent asthma symptoms from occurring. They are usually taken every day.  Reliever or rescue medicines. These quickly relieve asthma symptoms. They are used as needed and provide short-term relief. Your health care provider will help you create an asthma action plan. An asthma action plan is a written plan for managing and treating your asthma attacks. It includes a list of your asthma triggers and how they may be avoided. It also includes information on when medicines should be taken and when their dosage should be changed. An action plan may also involve the use of a device called a peak flow meter. A peak flow meter measures how well the lungs are working. It helps you monitor your condition. HOME CARE INSTRUCTIONS   Take medicines only as directed by your health care provider. Speak with your health care provider if you have questions about how or when to take the medicines.  Use a peak flow meter as directed by your health care provider. Record and keep track of readings.  Understand and use the action plan to help minimize or stop an asthma attack without needing to seek medical care.  Control your home environment in the following ways to help prevent asthma attacks:  Do not smoke. Avoid being exposed to secondhand smoke.  Change your heating and air conditioning filter regularly.  Limit your use of fireplaces and wood stoves.  Get rid of pests (such as roaches   and mice) and their droppings.  Throw away plants if you see mold on them.  Clean your floors and dust regularly. Use unscented cleaning products.  Try to have someone else vacuum for you regularly. Stay out of rooms while they are  being vacuumed and for a short while afterward. If you vacuum, use a dust mask from a hardware store, a double-layered or microfilter vacuum cleaner bag, or a vacuum cleaner with a HEPA filter.  Replace carpet with wood, tile, or vinyl flooring. Carpet can trap dander and dust.  Use allergy-proof pillows, mattress covers, and box spring covers.  Wash bed sheets and blankets every week in hot water and dry them in a dryer.  Use blankets that are made of polyester or cotton.  Clean bathrooms and kitchens with bleach. If possible, have someone repaint the walls in these rooms with mold-resistant paint. Keep out of the rooms that are being cleaned and painted.  Wash hands frequently. SEEK MEDICAL CARE IF:   You have wheezing, shortness of breath, or a cough even if taking medicine to prevent attacks.  The colored mucus you cough up (sputum) is thicker than usual.  Your sputum changes from clear or white to yellow, green, gray, or bloody.  You have any problems that may be related to the medicines you are taking (such as a rash, itching, swelling, or trouble breathing).  You are using a reliever medicine more than 2-3 times per week.  Your peak flow is still at 50-79% of your personal best after following your action plan for 1 hour.  You have a fever. SEEK IMMEDIATE MEDICAL CARE IF:   You seem to be getting worse and are unresponsive to treatment during an asthma attack.  You are short of breath even at rest.  You get short of breath when doing very little physical activity.  You have difficulty eating, drinking, or talking due to asthma symptoms.  You develop chest pain.  You develop a fast heartbeat.  You have a bluish color to your lips or fingernails.  You are light-headed, dizzy, or faint.  Your peak flow is less than 50% of your personal best.   This information is not intended to replace advice given to you by your health care provider. Make sure you discuss any  questions you have with your health care provider.   Document Released: 09/03/2005 Document Revised: 05/25/2015 Document Reviewed: 04/02/2013 Elsevier Interactive Patient Education 2016 Elsevier Inc.  

## 2016-03-01 NOTE — Telephone Encounter (Signed)
[facility] [place] and pt is aware.

## 2016-03-01 NOTE — Progress Notes (Signed)
Subjective:    Patient ID: Mary Raymond, female    DOB: 1950-11-18, 65 y.o.   MRN: [id number]  Pt presents to the office today for chronic follow up. She reports gaining 10 lbs over the last 4 months. She has not followed up with opthalmology or cardiology as instructed due to financial concerns. Diabetes She presents for her follow-up diabetic visit. She has type 2 diabetes mellitus. No MedicAlert identification noted. Her disease course has been stable. Hypoglycemia symptoms include nervousness/anxiousness and speech difficulty. Pertinent negatives for hypoglycemia include no confusion, dizziness, headaches or mood changes. Associated symptoms include blurred vision and fatigue. Pertinent negatives for diabetes include no chest pain, no foot paresthesias, no foot ulcerations, no visual change and no weight loss. Hypoglycemia complications include nocturnal hypoglycemia. Symptoms are stable. Diabetic complications include peripheral neuropathy. Pertinent negatives for diabetic complications include no CVA, heart disease or nephropathy. Risk factors for coronary artery disease include diabetes mellitus, dyslipidemia, hypertension, obesity, post-menopausal, sedentary lifestyle and family history. Current diabetic treatment includes insulin injections and oral agent (dual therapy). She is compliant with treatment all of the time. Her weight is decreasing steadily. She is following a low salt and generally unhealthy diet. Meal planning includes avoidance of concentrated sweets. She never participates in exercise. Her breakfast blood glucose range is generally 130-140 mg/dl. Her dinner blood glucose range is generally 140-180 mg/dl. An ACE inhibitor/angiotensin II receptor blocker is being taken. Eye exam is not current.  Hypertension This is a chronic problem. The current episode started more than 1 year ago. The problem has been gradually improving since onset. The problem is controlled. Associated  symptoms include anxiety, blurred vision, peripheral edema (At times) and shortness of breath. Pertinent negatives include no chest pain, headaches or palpitations. Risk factors for coronary artery disease include diabetes mellitus, dyslipidemia, family history, obesity, post-menopausal state and sedentary lifestyle. Past treatments include calcium channel blockers and angiotensin blockers. The current treatment provides mild improvement. Compliance problems include exercise.  Hypertensive end-organ damage includes a thyroid problem. There is no history of kidney disease, CAD/MI, CVA or heart failure. There is no history of sleep apnea.  Depression      The patient presents with depression.  This is a chronic problem.  The current episode started more than 1 year ago.   The onset quality is gradual.   The problem occurs constantly.  The problem has been waxing and waning since onset.  Associated symptoms include fatigue, helplessness, hopelessness, appetite change and sad.  Associated symptoms include no restlessness, no myalgias, no headaches and no suicidal ideas.  Past medical history includes hypothyroidism, thyroid problem, anxiety and depression.   Hyperlipidemia This is a chronic problem. The current episode started more than 1 year ago. The problem is controlled. Recent lipid tests were reviewed and are normal. Exacerbating diseases include diabetes, hypothyroidism and obesity. Factors aggravating her hyperlipidemia include fatty foods. Associated symptoms include leg pain and shortness of breath. Pertinent negatives include no chest pain or myalgias. Current antihyperlipidemic treatment includes statins. The current treatment provides moderate improvement of lipids. Compliance problems include adherence to exercise.  Risk factors for coronary artery disease include diabetes mellitus, dyslipidemia, hypertension, obesity, post-menopausal and a sedentary lifestyle.  Anxiety Presents for follow-up visit.  Onset was more than 5 years ago. The problem has been gradually worsening. Symptoms include depressed mood, excessive worry, irritability, nervous/anxious behavior, panic and shortness of breath. Patient reports no chest pain, confusion, dizziness, palpitations, restlessness or suicidal ideas. Symptoms occur most  days. The severity of symptoms is moderate. The symptoms are aggravated by family issues. The quality of sleep is good.   Her past medical history is significant for anxiety/panic attacks, asthma and depression. Past treatments include SSRIs. The treatment provided mild relief. Compliance with prior treatments has been good.  Gastroesophageal Reflux She complains of coughing and a hoarse voice. She reports no chest pain, no heartburn, no sore throat or no wheezing. This is a chronic problem. The current episode started more than 1 year ago. The problem occurs rarely. The problem has been waxing and waning. The symptoms are aggravated by certain foods. Associated symptoms include fatigue. Pertinent negatives include no weight loss. Risk factors include lack of exercise and caffeine use. She has tried a PPI for the symptoms. The treatment provided mild relief.  Asthma She complains of cough, frequent throat clearing, hoarse voice and shortness of breath. There is no sputum production or wheezing. This is a chronic problem. The current episode started more than 1 year ago. The problem occurs intermittently. The problem has been unchanged. The cough is non-productive. Associated symptoms include appetite change, ear congestion, ear pain, nasal congestion and rhinorrhea. Pertinent negatives include no chest pain, headaches, heartburn, myalgias, sore throat or weight loss. Her symptoms are aggravated by exercise. Her symptoms are alleviated by rest and leukotriene antagonist. She reports moderate improvement on treatment. Her symptoms are not alleviated by rest. Her past medical history is significant for  asthma.  Thyroid Problem Symptoms include anxiety, depressed mood, dry skin, fatigue and hoarse voice. Patient reports no constipation, diarrhea, palpitations, visual change or weight loss. The symptoms have been worsening. Past treatments include levothyroxine. The treatment provided moderate relief. Her past medical history is significant for diabetes and hyperlipidemia. There is no history of heart failure.  Sinusitis The current episode started 1 to 4 weeks ago. The problem has been waxing and waning since onset. The pain is moderate. Associated symptoms include coughing, ear pain, a hoarse voice and shortness of breath. Pertinent negatives include no headaches or sore throat. Past treatments include oral decongestants. The treatment provided mild relief.  Neuropathy Pt states she has bilateral numbness and tingling in her feet intermittently.     Review of Systems  Constitutional: Positive for appetite change, irritability and fatigue. Negative for weight loss.  HENT: Positive for ear pain, hoarse voice and rhinorrhea. Negative for sore throat.   Eyes: Positive for blurred vision and visual disturbance.  Respiratory: Positive for cough and shortness of breath. Negative for sputum production and wheezing.   Cardiovascular: Positive for leg swelling. Negative for chest pain and palpitations.  Gastrointestinal: Negative for heartburn, diarrhea and constipation.  Endocrine: Negative.   Musculoskeletal: Negative.  Negative for myalgias.  Neurological: Positive for speech difficulty. Negative for dizziness and headaches.  Hematological: Negative.   Psychiatric/Behavioral: Positive for depression. Negative for suicidal ideas and confusion. The patient is nervous/anxious.   All other systems reviewed and are negative.      Objective:   Physical Exam  Constitutional: She is oriented to person, place, and time. She appears well-developed and well-nourished. No distress.  Morbid obese     HENT:  Head: Normocephalic and atraumatic.  Right Ear: Tympanic membrane and external ear normal.  Left Ear: Tympanic membrane and external ear normal.  Nose: Right sinus exhibits maxillary sinus tenderness. Right sinus exhibits no frontal sinus tenderness. Left sinus exhibits maxillary sinus tenderness. Left sinus exhibits no frontal sinus tenderness.  Mouth/Throat: Uvula is midline.  Nasal passage  erythemas with mild swelling    Eyes: Conjunctivae, EOM and lids are normal. Pupils are equal, round, and reactive to light.  Neck: Normal range of motion. Neck supple. Decreased carotid pulses present. Carotid bruit is not present. No thyromegaly present.  Cardiovascular: Normal rate, regular rhythm, normal heart sounds and intact distal pulses.   No murmur heard. Pulmonary/Chest: Effort normal and breath sounds normal. No respiratory distress. She has no wheezes.  Abdominal: Soft. Bowel sounds are normal. She exhibits no distension. There is no tenderness.  Musculoskeletal: Normal range of motion. She exhibits edema (trace in BLE). She exhibits no tenderness.  Lymphadenopathy:       Head (right side): No submental, no submandibular and no posterior auricular adenopathy present.       Head (left side): No submental, no submandibular and no posterior auricular adenopathy present.    She has no cervical adenopathy.  Neurological: She is alert and oriented to person, place, and time. She has normal reflexes. No cranial nerve deficit.  Skin: Skin is warm and dry.  Psychiatric: She has a normal mood and affect. Her behavior is normal. Judgment and thought content normal.  Vitals reviewed.     BP 139/74 mmHg  Pulse 93  Temp(Src) 97 F (36.1 C) (Oral)  Ht _0  (1.651 m)  Wt 285 lb 6.4 oz (129.457 kg)  BMI 47.49 kg/m2     Assessment & Plan:  1. Essential hypertension - CMP14+EGFR  2. Asthma, unspecified asthma severity, uncomplicated - ZOX09+UEAV - budesonide-formoterol (SYMBICORT)  160-4.5 MCG/ACT inhaler; INHALE 2 PUFFS INTO THE LUNGS 2 (TWO) TIMES DAILY.  Dispense: 3 Inhaler; Refill: 0  3. Gastroesophageal reflux disease, esophagitis presence not specified - CMP14+EGFR  4. Hypothyroidism, unspecified hypothyroidism type - CMP14+EGFR - Thyroid Panel With TSH  5. Neuropathy (HCC) - CMP14+EGFR  6. Primary osteoarthritis involving multiple joints - CMP14+EGFR  7. Hyperlipidemia - CMP14+EGFR - Lipid panel  8. Uncontrolled type 2 diabetes mellitus without complication, with long-term current use of insulin ([place]) - Ambulatory referral to Ophthalmology - CMP14+EGFR - Bayer Dobson Hb A1c Waived - Microalbumin / creatinine urine ratio  9. Anxiety --PT started on lexapro 10 mg -Stress management discussed - CMP14+EGFR - ALPRAZolam (XANAX) 0.5 MG tablet; Take 1 tablet (0.5 mg total) by mouth 2 (two) times daily as needed. for anxiety  Dispense: 45 tablet; Refill: 2 - escitalopram (LEXAPRO) 10 MG tablet; Take 1 tablet (10 mg total) by mouth daily.  Dispense: 90 tablet; Refill: 3  10. Obesity, morbid, BMI 40.0-49.9 (HCC) - CMP14+EGFR  11. Vitamin D deficiency - CMP14+EGFR  12. Depression -PT started on lexapro 10 mg -Stress management discussed - CMP14+EGFR - escitalopram (LEXAPRO) 10 MG tablet; Take 1 tablet (10 mg total) by mouth daily.  Dispense: 90 tablet; Refill: 3  13. Chronic back pain - CMP14+EGFR  14. Breast cancer screening - [facility] - CMP14+EGFR   Continue all meds Labs pending Health Maintenance reviewed Diet and exercise encouraged RTO 3 months  Evelina Dun, FNP

## 2016-03-02 LAB — LIPID PANEL
CHOLESTEROL TOTAL: 254 mg/dL — AB (ref 100–199)
Chol/HDL Ratio: 5.9 ratio units — ABNORMAL HIGH (ref 0.0–4.4)
HDL: 43 mg/dL (ref >39)
LDL Calculated: 136 mg/dL — ABNORMAL HIGH (ref 0–99)
TRIGLYCERIDES: 374 mg/dL — AB (ref 0–149)
VLDL Cholesterol Cal: 75 mg/dL — ABNORMAL HIGH (ref 5–40)

## 2016-03-02 LAB — CMP14+EGFR
A/G RATIO: 1.4 (ref 1.2–2.2)
ALK PHOS: 99 IU/L (ref 39–117)
ALT: 11 IU/L (ref 0–32)
AST: 9 IU/L (ref 0–40)
Albumin: 3.9 g/dL (ref 3.6–4.8)
BUN/Creatinine Ratio: 36 — ABNORMAL HIGH (ref 12–28)
BUN: 24 mg/dL (ref 8–27)
Bilirubin Total: 0.2 mg/dL (ref 0.0–1.2)
CALCIUM: 10 mg/dL (ref 8.7–10.3)
CHLORIDE: 104 mmol/L (ref 96–106)
CO2: 23 mmol/L (ref 18–29)
Creatinine, Ser: 0.66 mg/dL (ref 0.57–1.00)
GFR calc Af Amer: 108 mL/min/{1.73_m2} (ref >59)
GFR, EST NON AFRICAN AMERICAN: 94 mL/min/{1.73_m2} (ref >59)
GLOBULIN, TOTAL: 2.8 g/dL (ref 1.5–4.5)
Glucose: 63 mg/dL — ABNORMAL LOW (ref 65–99)
POTASSIUM: 4.9 mmol/L (ref 3.5–5.2)
SODIUM: 144 mmol/L (ref 134–144)
Total Protein: 6.7 g/dL (ref 6.0–8.5)

## 2016-03-02 LAB — THYROID PANEL WITH TSH
Free Thyroxine Index: 2.1 (ref 1.2–4.9)
T3 UPTAKE RATIO: 29 % (ref 24–39)
T4 TOTAL: 7.4 ug/dL (ref 4.5–12.0)
TSH: 2.45 u[IU]/mL (ref 0.450–4.500)

## 2016-03-02 LAB — MICROALBUMIN / CREATININE URINE RATIO
CREATININE, UR: 57.2 mg/dL
MICROALB/CREAT RATIO: 1875.2 mg/g{creat} — AB (ref 0.0–30.0)
MICROALBUM., U, RANDOM: 1072.6 ug/mL

## 2016-03-02 LAB — HIV ANTIBODY (ROUTINE TESTING W REFLEX): HIV SCREEN 4TH GENERATION: NONREACTIVE

## 2016-03-05 ENCOUNTER — Other Ambulatory Visit: Payer: Self-pay | Admitting: Family

## 2016-03-05 MED ORDER — EZETIMIBE 10 MG PO TABS: 10.0000 mg | ORAL_TABLET | Freq: Every day | ORAL | Status: DC

## 2016-03-22 ENCOUNTER — Other Ambulatory Visit: Payer: Self-pay | Admitting: Family

## 2016-03-22 NOTE — Telephone Encounter (Signed)
Prescription for Xanax called in to [facility], patient informed

## 2016-03-22 NOTE — Telephone Encounter (Signed)
[facility] just seen 02/2016

## 2016-03-28 ENCOUNTER — Other Ambulatory Visit: Payer: Self-pay | Admitting: Family

## 2016-04-09 ENCOUNTER — Other Ambulatory Visit: Payer: Self-pay | Admitting: Family

## 2016-04-09 MED ORDER — SULFAMETHOXAZOLE-TRIMETHOPRIM 800-160 MG PO TABS: 1.0000 | ORAL_TABLET | Freq: Two times a day (BID) | ORAL | 0 refills | Status: DC

## 2016-04-09 MED ORDER — PHENAZOPYRIDINE HCL 100 MG PO TABS: 100.0000 mg | ORAL_TABLET | Freq: Three times a day (TID) | ORAL | 0 refills | Status: DC

## 2016-04-09 NOTE — Progress Notes (Signed)
PT called stating she has severe lower abd pain, dysuria and this is how a UTI always starts. Pt requesting antibiotic and rx of pyridium for the burning. Prescription sent to pharmacy

## 2016-04-16 ENCOUNTER — Other Ambulatory Visit: Payer: Self-pay | Admitting: Family

## 2016-04-20 ENCOUNTER — Emergency Department (HOSPITAL_COMMUNITY)
Admission: EM | Admit: 2016-04-20 | Discharge: 2016-04-20 | Disposition: A | Discharge status: Home / Self Care | Payer: Medicare PPO | Attending: Emergency Medicine | Admitting: Emergency Medicine

## 2016-04-20 ENCOUNTER — Encounter (HOSPITAL_COMMUNITY): Payer: Self-pay | Admitting: Emergency Medicine

## 2016-04-20 ENCOUNTER — Emergency Department (HOSPITAL_COMMUNITY): Payer: Medicare PPO

## 2016-04-20 DIAGNOSIS — M79661 Pain in right lower leg: Secondary | ICD-10-CM

## 2016-04-20 DIAGNOSIS — R05 Cough: Secondary | ICD-10-CM | POA: Diagnosis not present

## 2016-04-20 DIAGNOSIS — R600 Localized edema: Secondary | ICD-10-CM

## 2016-04-20 DIAGNOSIS — J45909 Unspecified asthma, uncomplicated: Secondary | ICD-10-CM | POA: Diagnosis not present

## 2016-04-20 DIAGNOSIS — Z79899 Other long term (current) drug therapy: Secondary | ICD-10-CM | POA: Diagnosis not present

## 2016-04-20 DIAGNOSIS — E119 Type 2 diabetes mellitus without complications: Secondary | ICD-10-CM | POA: Diagnosis not present

## 2016-04-20 DIAGNOSIS — E039 Hypothyroidism, unspecified: Secondary | ICD-10-CM | POA: Diagnosis not present

## 2016-04-20 DIAGNOSIS — Z794 Long term (current) use of insulin: Secondary | ICD-10-CM | POA: Diagnosis not present

## 2016-04-20 DIAGNOSIS — R51 Headache: Secondary | ICD-10-CM | POA: Diagnosis not present

## 2016-04-20 DIAGNOSIS — R0602 Shortness of breath: Secondary | ICD-10-CM | POA: Diagnosis not present

## 2016-04-20 DIAGNOSIS — R1031 Right lower quadrant pain: Secondary | ICD-10-CM | POA: Diagnosis present

## 2016-04-20 DIAGNOSIS — M25551 Pain in right hip: Secondary | ICD-10-CM

## 2016-04-20 DIAGNOSIS — I10 Essential (primary) hypertension: Secondary | ICD-10-CM | POA: Insufficient documentation

## 2016-04-20 DIAGNOSIS — Z7984 Long term (current) use of oral hypoglycemic drugs: Secondary | ICD-10-CM | POA: Diagnosis not present

## 2016-04-20 LAB — CBC
HCT: 33.3 % — ABNORMAL LOW (ref 36.0–46.0)
HEMOGLOBIN: 10.2 g/dL — AB (ref 12.0–15.0)
MCH: 24 pg — ABNORMAL LOW (ref 26.0–34.0)
MCHC: 30.6 g/dL (ref 30.0–36.0)
MCV: 78.4 fL (ref 78.0–100.0)
Platelets: 268 10*3/uL (ref 150–400)
RBC: 4.25 MIL/uL (ref 3.87–5.11)
RDW: 16.1 % — ABNORMAL HIGH (ref 11.5–15.5)
WBC: 10.7 10*3/uL — AB (ref 4.0–10.5)

## 2016-04-20 LAB — URINALYSIS, ROUTINE W REFLEX MICROSCOPIC
BILIRUBIN URINE: NEGATIVE
Glucose, UA: NEGATIVE mg/dL
KETONES UR: NEGATIVE mg/dL
LEUKOCYTES UA: NEGATIVE
NITRITE: NEGATIVE
Protein, ur: 100 mg/dL — AB
SPECIFIC GRAVITY, URINE: 1.02 (ref 1.005–1.030)
pH: 5.5 (ref 5.0–8.0)

## 2016-04-20 LAB — COMPREHENSIVE METABOLIC PANEL
ALBUMIN: 3.5 g/dL (ref 3.5–5.0)
ALT: 16 U/L (ref 14–54)
ANION GAP: 8 (ref 5–15)
AST: 18 U/L (ref 15–41)
Alkaline Phosphatase: 82 U/L (ref 38–126)
BILIRUBIN TOTAL: 0.5 mg/dL (ref 0.3–1.2)
BUN: 19 mg/dL (ref 6–20)
CALCIUM: 9.1 mg/dL (ref 8.9–10.3)
CO2: 23 mmol/L (ref 22–32)
Chloride: 109 mmol/L (ref 101–111)
Creatinine, Ser: 0.71 mg/dL (ref 0.44–1.00)
GLUCOSE: 63 mg/dL — AB (ref 65–99)
Potassium: 4.4 mmol/L (ref 3.5–5.1)
SODIUM: 140 mmol/L (ref 135–145)
TOTAL PROTEIN: 7.1 g/dL (ref 6.5–8.1)

## 2016-04-20 LAB — URINE MICROSCOPIC-ADD ON

## 2016-04-20 LAB — LIPASE, BLOOD: LIPASE: 14 U/L (ref 11–51)

## 2016-04-20 MED ORDER — HYDROMORPHONE HCL 1 MG/ML IJ SOLN
1.0000 mg | Freq: Once | INTRAMUSCULAR | Status: AC
  Administered 2016-04-20: 1 mg via INTRAVENOUS
  Filled 2016-04-20: qty 1

## 2016-04-20 MED ORDER — ONDANSETRON HCL 4 MG PO TABS: 4.0000 mg | ORAL_TABLET | Freq: Four times a day (QID) | ORAL | 0 refills | Status: DC

## 2016-04-20 MED ORDER — DIATRIZOATE MEGLUMINE & SODIUM 66-10 % PO SOLN
ORAL | Status: AC
  Filled 2016-04-20: qty 30

## 2016-04-20 MED ORDER — ONDANSETRON HCL 4 MG/2ML IJ SOLN
4.0000 mg | Freq: Once | INTRAMUSCULAR | Status: AC
  Administered 2016-04-20: 4 mg via INTRAVENOUS
  Filled 2016-04-20: qty 2

## 2016-04-20 MED ORDER — OXYCODONE-ACETAMINOPHEN 5-325 MG PO TABS: 2.0000 | ORAL_TABLET | ORAL | 0 refills | Status: DC | PRN

## 2016-04-20 MED ORDER — IOPAMIDOL (ISOVUE-300) INJECTION 61%
100.0000 mL | Freq: Once | INTRAVENOUS | Status: AC | PRN
  Administered 2016-04-20: 100 mL via INTRAVENOUS

## 2016-04-20 MED ORDER — OXYCODONE-ACETAMINOPHEN 5-325 MG PO TABS
1.0000 | ORAL_TABLET | Freq: Once | ORAL | Status: AC
  Administered 2016-04-20: 1 via ORAL
  Filled 2016-04-20: qty 1

## 2016-04-20 MED ORDER — SODIUM CHLORIDE 0.9 % IV SOLN: INTRAVENOUS | Status: DC

## 2016-04-20 MED ORDER — SODIUM CHLORIDE 0.9 % IV BOLUS (SEPSIS)
500.0000 mL | Freq: Once | INTRAVENOUS | Status: AC
  Administered 2016-04-20: 500 mL via INTRAVENOUS

## 2016-04-20 NOTE — ED Provider Notes (Signed)
Blood pressure 152/59, pulse 73, temperature 98 F (36.7 C), temperature source Oral, resp. rate 19, height 5\' 5"  (1.651 m), weight 280 lb (127 kg), SpO2 95 %.  Assuming care from Dr. Rogene Houston.  In short, Mary Raymond is a 65 y.o. female with a chief complaint of Abdominal Pain .  Refer to the original H&P for additional details.  The current plan of care is to follow CT scan to evaluate for possible hernia and reassess pain control.   06:25 PM Reviewed CT scan updated patient. No acute findings on CT scan to suggest the patient's pain etiology. Discussed that I provide a small amount of Percocet for pain control over the weekend. She will discuss further pain management and outpatient evaluation with her primary care physician on Monday. Pain is not significantly worsened. Plan for discharge at this time.   At this time, I do not feel there is any life-threatening condition present. I have reviewed and discussed all results (EKG, imaging, lab, urine as appropriate), exam findings with patient. I have reviewed nursing notes and appropriate previous records.  I feel the patient is safe to be discharged home without further emergent workup. Discussed usual and customary return precautions. Patient and family (if present) verbalize understanding and are comfortable with this plan.  Patient will follow-up with their primary care provider. If they do not have a primary care provider, information for follow-up has been provided to them. All questions have been answered.  Nanda Quinton, MD   Margette Fast, MD 04/20/16 [phone]

## 2016-04-20 NOTE — ED Notes (Signed)
Dr Rogene Houston notified of glucose 63 . OK to give a drink

## 2016-04-20 NOTE — ED Triage Notes (Signed)
Pt report right lower quadrant pain since yesterday going down right leg.  States pain is in groin area.

## 2016-04-20 NOTE — ED Notes (Signed)
Pt transported to CT ?

## 2016-04-20 NOTE — Discharge Instructions (Signed)

## 2016-04-20 NOTE — ED Provider Notes (Signed)
[facility] DEPT Provider Note   CSN: [id number] Arrival date & time: 04/20/16  1057  First Provider Contact:  First MD Initiated Contact with Patient 04/20/16 1129   By signing my name below, I, Georgette Shell, attest that this documentation has been prepared under the direction and in the presence of Fredia Sorrow, MD. Electronically Signed: Georgette Shell, ED Scribe. 04/20/16. 12:01 PM.  History   Chief Complaint Chief Complaint  Patient presents with  . Abdominal Pain   HPI Comments: Mary Raymond is a 65 y.o. female with h/o HTN, DM, and GERD who presents to the Emergency Department complaining of sudden onset, 8/10, RLQ abdominal pain radiating to right hip, right leg and right calf onset 3 days ago that worsened last night. Pt also has associated cough, back pain, headache, bilateral leg swelling, and shortness of breath. Pain is exacerbated with movement and ambulating. Per son, pt has had h/o similar symptoms intermittently after a fall in 2003. Pt has diarrhea which is baseline. Pt denies fever, congestion, rhinorrhea, sore throat, visual disturbance, chest pain, nausea, vomiting, dysuria, hematuria, neck pain, rash, and bruising easily.  The history is provided by the patient and a relative. No language interpreter was used.    Past Medical History:  Diagnosis Date  . Allergy   . Anxiety   . Asthma   . Diabetes mellitus without complication ([place])   . Gout   . Hyperlipidemia   . Hypertension   . Hypothyroid   . Neuropathy ([place])   . Obesity   . Vitamin D deficiency     Patient Active Problem List   Diagnosis Date Noted  . Chronic back pain 08/10/2015  . 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 Depression 11/15/2014  . GERD (gastroesophageal reflux disease) 11/15/2014  . Hypertension   . Hyperlipidemia   . Diabetes mellitus type 2, uncontrolled, without complications ([place])   . Anxiety   . Obesity, morbid, BMI 40.0-49.9 ([place])   . Allergy   .  Asthma   . Vitamin D deficiency   . Neuropathy ([place])   . Hypothyroidism 02/25/2013    Past Surgical History:  Procedure Laterality Date  . ABDOMINAL HYSTERECTOMY    . BREAST LUMPECTOMY    . rectal fissure      OB History    No data available       Home Medications    Prior to Admission medications   Medication Sig Start Date End Date Taking? Authorizing Provider  acetaminophen (TYLENOL) 500 MG tablet Take 1,000 mg by mouth every 6 (six) hours as needed for moderate pain.   Yes Historical Provider, MD  acyclovir (ZOVIRAX) 400 MG tablet TAKE 1 TABLET EVERY DAY 01/09/16  Yes [name], FNP  ALPRAZolam (XANAX) 0.5 MG tablet TAKE 1 TABLET TWICE DAILY AS NEEDED FOR ANXIETY 03/22/16  Yes [name], FNP  amLODipine (NORVASC) 10 MG tablet Take 1 tablet (10 mg total) by mouth daily. 11/14/15  Yes [name], FNP  aspirin 81 MG tablet Take 81 mg by mouth daily.   Yes Historical Provider, MD  budesonide-formoterol (SYMBICORT) 160-4.5 MCG/ACT inhaler INHALE 2 PUFFS INTO THE LUNGS 2 (TWO) TIMES DAILY. Patient taking differently: Inhale 2 puffs into the lungs 2 (two) times daily. INHALE 2 PUFFS INTO THE LUNGS 2 (TWO) TIMES DAILY. 03/01/16  Yes [name], FNP  busPIRone (BUSPAR) 15 MG tablet TAKE 1/2 TABLET TWICE DAILY AS DIRECTED 01/09/16  Yes [name], FNP  celecoxib (CELEBREX)  200 MG capsule Take 1 capsule (200 mg total) by mouth 2 (two) times daily. 11/14/15  Yes [name], FNP  escitalopram (LEXAPRO) 10 MG tablet Take 1 tablet (10 mg total) by mouth daily. 03/01/16  Yes [name], FNP  fluticasone (FLONASE) 50 MCG/ACT nasal spray Place 2 sprays into both nostrils daily. 03/12/14  Yes Lodema Pilot, PA-C  furosemide (LASIX) 20 MG tablet Take 1 tablet (20 mg total) by mouth daily. 11/14/15  Yes [name], FNP  insulin aspart (NOVOLOG FLEXPEN) 100 UNIT/ML FlexPen Inject 45 Units into the skin 3 (three) times daily with meals. 12/13/15  Yes [name], FNP  Insulin Glargine (LANTUS SOLOSTAR) 100 UNIT/ML Solostar Pen Inject 45 Units into the skin 2 (two) times daily. 11/14/15  Yes [name], FNP  Ipratropium-Albuterol (COMBIVENT RESPIMAT) 20-100 MCG/ACT AERS respimat Inhale 1 puff into the lungs every 6 (six) hours as needed for wheezing.   Yes Historical Provider, MD  levothyroxine (SYNTHROID, LEVOTHROID) 50 MCG tablet TAKE 1 TABLET EVERY DAY BEFORE BREAKFAST Patient taking differently: Take 50 mcg by mouth daily before breakfast. TAKE 1 TABLET EVERY DAY BEFORE BREAKFAST 11/14/15  Yes [name], FNP  losartan (COZAAR) 100 MG tablet Take 1 tablet (100 mg total) by mouth daily. 11/14/15  Yes [name], FNP  meclizine (ANTIVERT) 25 MG tablet TAKE 1 TABLET THREE TIMES DAILY AS NEEDED 02/24/16  Yes Fransisca Kaufmann Dettinger, MD  metFORMIN (GLUCOPHAGE) 500 MG tablet TAKE 2 TABLETS (1,000MG) BY MOUTH TWICE A DAY WITH A MEAL 11/14/15  Yes [name], FNP  metoprolol succinate (TOPROL-XL) 50 MG 24 hr tablet TAKE 1 TABLET DAILY. TAKE WITH OR IMMEDIATELY FOLLOWING A MEAL. 03/29/16  Yes [name], FNP  montelukast (SINGULAIR) 10 MG tablet TAKE 1 TABLET AT BEDTIME 11/16/15  Yes [name], FNP  omeprazole (PRILOSEC) 40 MG capsule TAKE 1 CAPSULE EVERY DAY 03/29/16  Yes [name], FNP  phenazopyridine (PYRIDIUM) 100 MG tablet Take 1 tablet (100 mg total) by mouth 3 (three) times daily with meals. 04/09/16  Yes Christy A Hawks, FNP  VENTOLIN HFA 108 (90 Base) MCG/ACT inhaler INHALE 2 PUFFS INTO THE LUNGS EVERY 4 HOURS AS NEEDED FOR WHEEZING OR SHORTNESS OF BREATH. 01/09/16  Yes [name], FNP  Vitamin D, Ergocalciferol, (DRISDOL) 50000 units CAPS capsule Take 50,000 Units by mouth every 7 (seven) days. Take on Friday. 09/15/15  Yes Historical Provider, MD  albuterol-ipratropium (COMBIVENT) 18-103 MCG/ACT inhaler Inhale 2 puffs into the lungs as needed for wheezing or shortness of breath. Patient not taking: Reported on 04/20/2016  02/08/16   [name], FNP  amoxicillin-clavulanate (AUGMENTIN) 875-125 MG tablet Take 1 tablet by mouth 2 (two) times daily. Patient not taking: Reported on 04/20/2016 03/01/16   [name], FNP  Blood Glucose Calibration (OT ULTRA/FASTTK CNTRL SOLN) SOLN  01/13/14   Historical Provider, MD  Blood Glucose Monitoring Suppl (TRUE METRIX METER) w/Device KIT Use device to check blood sugar up to 5 times qd. E11.65 11/10/15   [name], FNP  glucose blood (TRUE METRIX BLOOD GLUCOSE TEST) test strip Test up to 5 times qd. Dx E11.65 11/14/15   [name], FNP  insulin lispro (HUMALOG) 100 UNIT/ML injection Inject 0.45 mLs (45 Units total) into the skin 3 (three) times daily with meals. Patient not taking: Reported on 04/20/2016 12/01/15   [name], FNP  Insulin Pen Needle (B-D UF III MINI  PEN NEEDLES) 31G X 5 MM MISC USE TO GIVE LANTUS INJECTION   TWICE A DAY AS INSTRUCTED 11/23/15   [name], FNP  Insulin Syringe-Needle U-100 (B-D INS SYRINGE 0.5CC/31GX5/16) 31G X 5/16" 0.5 ML MISC USE TO INJECT INSULIN 5 TIMES A DAY AS INSTRUCTED 11/14/15   [name], FNP  Lancet Devices (ONE TOUCH DELICA LANCING DEV) MISC CHECK BLOOD SUGAR 5 TIMES DAILY 11/14/15   [name], FNP  sulfamethoxazole-trimethoprim (BACTRIM DS) 800-160 MG tablet Take 1 tablet by mouth 2 (two) times daily. Patient not taking: Reported on 04/20/2016 04/09/16   [name], FNP  TRUEPLUS LANCETS 33G MISC TEST UP TO 5 TIMES EVERY DAY 01/09/16   [name], FNP    Family History Family History  Problem Relation Age of Onset  . Cancer Mother     colon  . Diabetes Mother   . Hypertension Mother   . Dementia Mother   . Heart disease Father   . Asthma Sister   . Cancer Sister     breast  . Asthma Sister   . Scoliosis Sister   . Diabetes Sister   . Asthma Sister     Social History Social History  Substance Use Topics  . Smoking status: Never Smoker  . Smokeless tobacco: Not on file  . Alcohol  use No     Allergies   Atorvastatin   Review of Systems Review of Systems  Constitutional: Negative for fever.  HENT: Negative for congestion, rhinorrhea and sore throat.   Eyes: Negative for visual disturbance.  Respiratory: Positive for cough and shortness of breath.   Cardiovascular: Positive for leg swelling. Negative for chest pain.  Gastrointestinal: Positive for abdominal pain, diarrhea (baseline) and nausea. Negative for vomiting.  Genitourinary: Negative for dysuria and hematuria.  Musculoskeletal: Negative for back pain and neck pain.  Skin: Negative for rash.  Neurological: Positive for headaches.  Hematological: Does not bruise/bleed easily.  Psychiatric/Behavioral: Negative for confusion.   Physical Exam Updated Vital Signs BP 157/67 (BP Location: Right Wrist)   Pulse 79   Temp 98.4 F (36.9 C) (Oral)   Resp 19   Ht _0  (1.651 m)   Wt 127 kg   SpO2 100%   BMI 46.59 kg/m   Physical Exam  Constitutional: She is oriented to person, place, and time. She appears well-developed and well-nourished.  HENT:  Head: Normocephalic.  Moist mucous membranes  Eyes: Conjunctivae and EOM are normal. Pupils are equal, round, and reactive to light. No scleral icterus.  Cardiovascular: Normal rate, regular rhythm and normal heart sounds.  Exam reveals no friction rub.   No murmur heard. Pulmonary/Chest: Effort normal and breath sounds normal. No respiratory distress. She has no wheezes. She has no rales.  Abdominal: Soft. Bowel sounds are normal. She exhibits no distension. There is tenderness.  Patient was some tenderness in the right groin area. Patient has a lot of adipose tissue not able to appreciate a mass.  Musculoskeletal: Normal range of motion. She exhibits edema and tenderness.  TTP to right hip and right groin. Bilateral lower extremity edema.  Neurological: She is alert and oriented to person, place, and time. No cranial nerve deficit. Coordination normal.    Skin: Skin is warm and dry.  Psychiatric: She has a normal mood and affect. Her behavior is normal.  Nursing note and vitals reviewed.  ED Treatments / Results  DIAGNOSTIC STUDIES: Oxygen Saturation is 98% on RA, normal by my interpretation.  COORDINATION OF CARE: 12:00 PM Discussed treatment plan with pt at bedside which includes IVF and pt agreed to plan.  Labs (all labs ordered are listed, but only abnormal results are displayed) Labs Reviewed  COMPREHENSIVE METABOLIC PANEL - Abnormal; Notable for the following:       Result Value   Glucose, Bld 63 (*)    All other components within normal limits  CBC - Abnormal; Notable for the following:    WBC 10.7 (*)    Hemoglobin 10.2 (*)    HCT 33.3 (*)    MCH 24.0 (*)    RDW 16.1 (*)    All other components within normal limits  URINALYSIS, ROUTINE W REFLEX MICROSCOPIC (NOT AT [facility]) - Abnormal; Notable for the following:    Hgb urine dipstick TRACE (*)    Protein, ur 100 (*)    All other components within normal limits  URINE MICROSCOPIC-ADD ON - Abnormal; Notable for the following:    Squamous Epithelial / LPF 6-30 (*)    Bacteria, UA RARE (*)    All other components within normal limits  LIPASE, BLOOD    EKG  EKG Interpretation None       Radiology US Venous Img Lower Unilateral Right  Result Date: 04/20/2016 CLINICAL DATA:  65 year old female with right calf pain and swelling over the past 3 days EXAM: RIGHT LOWER EXTREMITY VENOUS DOPPLER ULTRASOUND TECHNIQUE: Gray-scale sonography with graded compression, as well as color Doppler and duplex ultrasound were performed to evaluate the lower extremity deep venous systems from the level of the common femoral vein and including the common femoral, femoral, profunda femoral, popliteal and calf veins including the posterior tibial, peroneal and gastrocnemius veins when visible. The superficial great saphenous vein was also interrogated. Spectral Doppler was utilized to evaluate  flow at rest and with distal augmentation maneuvers in the common femoral, femoral and popliteal veins. COMPARISON:  None. FINDINGS: Contralateral Common Femoral Vein: Respiratory phasicity is normal and symmetric with the symptomatic side. No evidence of thrombus. Normal compressibility. Common Femoral Vein: No evidence of thrombus. Normal compressibility, respiratory phasicity and response to augmentation. Saphenofemoral Junction: No evidence of thrombus. Normal compressibility and flow on color Doppler imaging. Profunda Femoral Vein: No evidence of thrombus. Normal compressibility and flow on color Doppler imaging. Femoral Vein: No evidence of thrombus. Normal compressibility, respiratory phasicity and response to augmentation. Popliteal Vein: No evidence of thrombus. Normal compressibility, respiratory phasicity and response to augmentation. Calf Veins: No evidence of thrombus. Normal compressibility and flow on color Doppler imaging. Superficial Great Saphenous Vein: No evidence of thrombus. Normal compressibility and flow on color Doppler imaging. Venous Reflux:  None. Other Findings:  None. IMPRESSION: No evidence of deep venous thrombosis. Electronically Signed   By: Jacqulynn Cadet M.D.   On: 04/20/2016 13:39   Dg Hip Unilat With Pelvis 2-3 Views Right  Result Date: 04/20/2016 CLINICAL DATA:  Acute right hip pain without known injury. EXAM: DG HIP (WITH OR WITHOUT PELVIS) 2-3V RIGHT COMPARISON:  None. FINDINGS: There is no evidence of hip fracture or dislocation. There is no evidence of arthropathy or other focal bone abnormality. IMPRESSION: Normal right hip. Electronically Signed   By: Marijo Conception, M.D.   On: 04/20/2016 13:01    Procedures Procedures (including critical care time)  Medications Ordered in ED Medications  0.9 %  sodium chloride infusion ( Intravenous New Bag/Given 04/20/16 1402)  HYDROmorphone (DILAUDID) injection 1 mg (not administered)  ondansetron (ZOFRAN) injection 4 mg  (not  administered)  ondansetron (ZOFRAN) injection 4 mg (4 mg Intravenous Given 04/20/16 1313)  HYDROmorphone (DILAUDID) injection 1 mg (1 mg Intravenous Given 04/20/16 1313)  sodium chloride 0.9 % bolus 500 mL (500 mLs Intravenous New Bag/Given 04/20/16 1313)     Initial Impression / Assessment and Plan / ED Course  I have reviewed the triage vital signs and the nursing notes.  Pertinent labs & imaging results that were available during my care of the patient were reviewed by me and considered in my medical decision making (see chart for details).  Clinical Course    Patient with extensive workup. No evidence of DVT in the right leg. No evidence of any bony injury to the right hip or pelvis. Symptoms seem to be consistent with muscle skeletal back pain with some radiation of pain into the anterior part of the leg. It is made worse by movement of the leg. The patient is also tender in the right groin area she is large and exam is difficult is always a possibility that there could be a groin hernia that is not identified. Patient will undergo CT abdomen and pelvis with contrast.  Final Clinical Impressions(s) / ED Diagnoses   Final diagnoses:  Hip pain, acute, right  Right lower quadrant abdominal pain    New Prescriptions New Prescriptions   No medications on file  I personally performed the services described in this documentation, which was scribed in my presence. The recorded information has been reviewed and is accurate.       Fredia Sorrow, MD 04/20/16 [phone]

## 2016-04-24 ENCOUNTER — Other Ambulatory Visit: Payer: Self-pay | Admitting: Family

## 2016-04-25 ENCOUNTER — Telehealth: Payer: Self-pay | Admitting: Family

## 2016-04-25 DIAGNOSIS — G8929 Other chronic pain: Secondary | ICD-10-CM

## 2016-04-25 DIAGNOSIS — M150 Primary generalized (osteo)arthritis: Principal | ICD-10-CM

## 2016-04-25 DIAGNOSIS — M549 Dorsalgia, unspecified: Secondary | ICD-10-CM

## 2016-04-25 DIAGNOSIS — M159 Polyosteoarthritis, unspecified: Secondary | ICD-10-CM

## 2016-04-25 NOTE — Telephone Encounter (Signed)
She will need a referral placed for [employer]

## 2016-04-26 ENCOUNTER — Other Ambulatory Visit: Payer: Self-pay | Admitting: Family

## 2016-04-26 ENCOUNTER — Encounter (INDEPENDENT_AMBULATORY_CARE_PROVIDER_SITE_OTHER): Payer: Medicare PPO

## 2016-04-26 MED ORDER — HYDROCODONE-ACETAMINOPHEN 7.5-325 MG PO TABS: 1.0000 | ORAL_TABLET | Freq: Two times a day (BID) | ORAL | 0 refills | Status: DC | PRN

## 2016-04-26 NOTE — Telephone Encounter (Signed)
Referral to pain clinic placed.

## 2016-04-26 NOTE — Progress Notes (Signed)
[facility] Prescription ready for pick up. Pt looked up in [employer]

## 2016-04-27 ENCOUNTER — Ambulatory Visit: Payer: Medicare PPO | Admitting: Family

## 2016-04-30 ENCOUNTER — Other Ambulatory Visit: Payer: Self-pay | Admitting: Family

## 2016-05-01 ENCOUNTER — Encounter: Payer: Self-pay | Admitting: Family

## 2016-05-01 ENCOUNTER — Ambulatory Visit (INDEPENDENT_AMBULATORY_CARE_PROVIDER_SITE_OTHER): Payer: Medicare PPO | Admitting: Family

## 2016-05-01 VITALS — BP 133/68 | HR 57 | Temp 97.4°F | Ht 65.0 in | Wt 291.2 lb

## 2016-05-01 DIAGNOSIS — G8929 Other chronic pain: Secondary | ICD-10-CM

## 2016-05-01 DIAGNOSIS — R309 Painful micturition, unspecified: Secondary | ICD-10-CM | POA: Diagnosis not present

## 2016-05-01 DIAGNOSIS — M549 Dorsalgia, unspecified: Secondary | ICD-10-CM | POA: Diagnosis not present

## 2016-05-01 DIAGNOSIS — M159 Polyosteoarthritis, unspecified: Secondary | ICD-10-CM

## 2016-05-01 DIAGNOSIS — M150 Primary generalized (osteo)arthritis: Secondary | ICD-10-CM

## 2016-05-01 DIAGNOSIS — Z79899 Other long term (current) drug therapy: Secondary | ICD-10-CM

## 2016-05-01 DIAGNOSIS — Z0289 Encounter for other administrative examinations: Secondary | ICD-10-CM

## 2016-05-01 DIAGNOSIS — F1120 Opioid dependence, uncomplicated: Secondary | ICD-10-CM | POA: Diagnosis not present

## 2016-05-01 LAB — MICROSCOPIC EXAMINATION: WBC, UA: >30 /hpf — AB (ref 0–?)

## 2016-05-01 LAB — URINALYSIS, COMPLETE
Bilirubin, UA: NEGATIVE
Glucose, UA: NEGATIVE
KETONES UA: NEGATIVE
NITRITE UA: NEGATIVE
PH UA: 5.5 (ref 5.0–7.5)
SPEC GRAV UA: 1.015 (ref 1.005–1.030)
Urobilinogen, Ur: 0.2 mg/dL (ref 0.2–1.0)

## 2016-05-01 MED ORDER — HYDROCODONE-ACETAMINOPHEN 7.5-325 MG PO TABS: 1.0000 | ORAL_TABLET | Freq: Two times a day (BID) | ORAL | 0 refills | Status: DC | PRN

## 2016-05-01 MED ORDER — SULFAMETHOXAZOLE-TRIMETHOPRIM 800-160 MG PO TABS: 1.0000 | ORAL_TABLET | Freq: Two times a day (BID) | ORAL | 0 refills | Status: DC

## 2016-05-01 NOTE — Patient Instructions (Signed)

## 2016-05-01 NOTE — Progress Notes (Signed)
[place] Controlled Substance Abuse database reviewed- Yes If yes- were their any concerning findings : Pt was given RX on 04/26/16 to let her last until this appt Depression screen [facility] 2/9 05/01/2016 03/01/2016 03/01/2016 03/01/2015 11/15/2014  Decreased Interest 3 3 3 1 2   Down, Depressed, Hopeless 3 3 3  0 3  PHQ - 2 Score 6 6 6 1 5   Altered sleeping 1 1 - - 0  Tired, decreased energy 3 3 - - 3  Change in appetite 3 3 - - 3  Feeling bad or failure about yourself  1 2 - - 3  Trouble concentrating 3 3 - - 3  Moving slowly or fidgety/restless 0 0 - - 0  Suicidal thoughts 0 0 - - 0  PHQ-9 Score 17 18 - - 17  Difficult doing work/chores Somewhat difficult - - - -    GAD 7 : Generalized Anxiety Score 05/01/2016  Nervous, Anxious, on Edge 3  Control/stop worrying 3  Worry too much - different things 3  Trouble relaxing 3  Restless 3  Easily annoyed or irritable 3  Afraid - awful might happen 3  Total GAD 7 Score 21  Anxiety Difficulty Not difficult at all       [place] drug screen performed- Yes  SOAPP  0= never  1= seldom  2=sometimes  3= often  4= very often  How often do you have mood swings? 4 How often do you smoke a cigarette within an hour after waling up? 0 How often have you taken medication other than the way that it was prescribed?0 How often have you used illegal drugs in the past 5 years? 0 How often, in your lifetime, have you had legal problems or been arrested? 0  Score 4  Alcohol Audit - How often during the last year have found that you: 0-Never   1- Less than monthly   2- Monthly     3-Weekly     4-daily or almost daily  - found that you were not able to stop drinking once you started- 0 -failed to do what was normally expected of you because of drinking- 0 -needed a first drink in the morning- 0 -had a feeling of guilt or remorse after drinking- 0 -are/were unable to remember what happened the night before because of your drinking- 0  0- NO    2- yes but not in last year  4- yes during last year -Have you or someone else been injured because of your drinking- 0 - Has anyone been concerned about your drinking or suggested you cut down- 0        TOTAL- 0  ( 0-7- alcohol education, 8-15- simple advice, 16-19 simple advice plus counseling, 20-40 referral for evaluation and treatment 0   Designated Pharmacy- [facility] [place]  Pain assessment: Cause of pain- Chronic back pain Pain location- Lower back, right leg Pain on scale of 1-10- 10   Frequency- constant  What increases pain-walking or standing What makes pain Better-Rest Effects on ADL - Pt unable stand for long periods of time   Pain management agreement reviewed and signed- Yes

## 2016-05-02 ENCOUNTER — Other Ambulatory Visit: Payer: Self-pay | Admitting: Family

## 2016-05-02 DIAGNOSIS — J45909 Unspecified asthma, uncomplicated: Secondary | ICD-10-CM

## 2016-05-09 LAB — TOXASSURE SELECT 13 (MW), URINE: PDF: 0

## 2016-05-21 ENCOUNTER — Other Ambulatory Visit: Payer: Self-pay | Admitting: Family

## 2016-05-23 ENCOUNTER — Other Ambulatory Visit: Payer: Self-pay | Admitting: Family

## 2016-05-23 DIAGNOSIS — Z794 Long term (current) use of insulin: Principal | ICD-10-CM

## 2016-05-23 DIAGNOSIS — E1165 Type 2 diabetes mellitus with hyperglycemia: Principal | ICD-10-CM

## 2016-05-23 DIAGNOSIS — IMO0001 Reserved for inherently not codable concepts without codable children: Secondary | ICD-10-CM

## 2016-05-28 ENCOUNTER — Other Ambulatory Visit: Payer: Self-pay | Admitting: Family

## 2016-05-28 DIAGNOSIS — E1165 Type 2 diabetes mellitus with hyperglycemia: Secondary | ICD-10-CM

## 2016-05-28 DIAGNOSIS — IMO0001 Reserved for inherently not codable concepts without codable children: Secondary | ICD-10-CM

## 2016-05-28 DIAGNOSIS — I10 Essential (primary) hypertension: Secondary | ICD-10-CM

## 2016-05-28 DIAGNOSIS — Z794 Long term (current) use of insulin: Secondary | ICD-10-CM

## 2016-06-25 ENCOUNTER — Other Ambulatory Visit: Payer: Self-pay | Admitting: Family

## 2016-07-12 ENCOUNTER — Other Ambulatory Visit: Payer: Self-pay | Admitting: Family

## 2016-07-12 DIAGNOSIS — J45909 Unspecified asthma, uncomplicated: Secondary | ICD-10-CM

## 2016-07-25 ENCOUNTER — Other Ambulatory Visit: Payer: Self-pay | Admitting: Family

## 2016-07-25 DIAGNOSIS — IMO0001 Reserved for inherently not codable concepts without codable children: Secondary | ICD-10-CM

## 2016-07-25 DIAGNOSIS — E1165 Type 2 diabetes mellitus with hyperglycemia: Principal | ICD-10-CM

## 2016-07-25 DIAGNOSIS — Z794 Long term (current) use of insulin: Secondary | ICD-10-CM

## 2016-08-02 ENCOUNTER — Encounter: Payer: Self-pay | Admitting: Family

## 2016-08-02 ENCOUNTER — Ambulatory Visit: Payer: Medicare PPO | Admitting: Family

## 2016-08-02 VITALS — BP 164/74 | HR 62 | Temp 97.5°F | Ht 65.0 in | Wt 293.4 lb

## 2016-08-02 DIAGNOSIS — M5441 Lumbago with sciatica, right side: Secondary | ICD-10-CM

## 2016-08-02 DIAGNOSIS — E785 Hyperlipidemia, unspecified: Secondary | ICD-10-CM

## 2016-08-02 DIAGNOSIS — J45909 Unspecified asthma, uncomplicated: Secondary | ICD-10-CM

## 2016-08-02 DIAGNOSIS — IMO0001 Reserved for inherently not codable concepts without codable children: Secondary | ICD-10-CM

## 2016-08-02 DIAGNOSIS — E559 Vitamin D deficiency, unspecified: Secondary | ICD-10-CM | POA: Diagnosis not present

## 2016-08-02 DIAGNOSIS — Z23 Encounter for immunization: Secondary | ICD-10-CM

## 2016-08-02 DIAGNOSIS — E1165 Type 2 diabetes mellitus with hyperglycemia: Secondary | ICD-10-CM | POA: Diagnosis not present

## 2016-08-02 DIAGNOSIS — Z794 Long term (current) use of insulin: Secondary | ICD-10-CM

## 2016-08-02 DIAGNOSIS — M5442 Lumbago with sciatica, left side: Secondary | ICD-10-CM

## 2016-08-02 DIAGNOSIS — K219 Gastro-esophageal reflux disease without esophagitis: Secondary | ICD-10-CM

## 2016-08-02 DIAGNOSIS — F419 Anxiety disorder, unspecified: Secondary | ICD-10-CM | POA: Diagnosis not present

## 2016-08-02 DIAGNOSIS — E039 Hypothyroidism, unspecified: Secondary | ICD-10-CM

## 2016-08-02 DIAGNOSIS — A6000 Herpesviral infection of urogenital system, unspecified: Secondary | ICD-10-CM

## 2016-08-02 DIAGNOSIS — I10 Essential (primary) hypertension: Secondary | ICD-10-CM | POA: Diagnosis not present

## 2016-08-02 DIAGNOSIS — M150 Primary generalized (osteo)arthritis: Secondary | ICD-10-CM

## 2016-08-02 DIAGNOSIS — F1120 Opioid dependence, uncomplicated: Secondary | ICD-10-CM

## 2016-08-02 DIAGNOSIS — F331 Major depressive disorder, recurrent, moderate: Secondary | ICD-10-CM

## 2016-08-02 DIAGNOSIS — G8929 Other chronic pain: Secondary | ICD-10-CM

## 2016-08-02 DIAGNOSIS — F332 Major depressive disorder, recurrent severe without psychotic features: Secondary | ICD-10-CM

## 2016-08-02 DIAGNOSIS — G629 Polyneuropathy, unspecified: Secondary | ICD-10-CM | POA: Diagnosis not present

## 2016-08-02 DIAGNOSIS — M159 Polyosteoarthritis, unspecified: Secondary | ICD-10-CM

## 2016-08-02 DIAGNOSIS — Z0289 Encounter for other administrative examinations: Secondary | ICD-10-CM

## 2016-08-02 LAB — BAYER DCA HB A1C WAIVED: HB A1C (BAYER DCA - WAIVED): 6.3 % (ref <7.0)

## 2016-08-02 MED ORDER — HYDROCODONE-ACETAMINOPHEN 7.5-325 MG PO TABS: 1.0000 | ORAL_TABLET | Freq: Two times a day (BID) | ORAL | 0 refills | Status: DC | PRN

## 2016-08-02 MED ORDER — ALPRAZOLAM 0.5 MG PO TABS: 0.5000 mg | ORAL_TABLET | Freq: Two times a day (BID) | ORAL | 3 refills | Status: DC | PRN

## 2016-08-02 MED ORDER — ESCITALOPRAM OXALATE 10 MG PO TABS: 10.0000 mg | ORAL_TABLET | Freq: Every day | ORAL | 3 refills | Status: DC

## 2016-08-02 MED ORDER — METOPROLOL SUCCINATE ER 50 MG PO TB24: 50.0000 mg | ORAL_TABLET | Freq: Every day | ORAL | 1 refills | Status: DC

## 2016-08-02 NOTE — Patient Instructions (Signed)
DASH Eating Plan DASH stands for "Dietary Approaches to Stop Hypertension." The DASH eating plan is a healthy eating plan that has been shown to reduce high blood pressure (hypertension). Additional health benefits may include reducing the risk of type 2 diabetes mellitus, heart disease, and stroke. The DASH eating plan may also help with weight loss. What do I need to know about the DASH eating plan? For the DASH eating plan, you will follow these general guidelines:  Choose foods with less than 150 milligrams of sodium per serving (as listed on the food label).  Use salt-free seasonings or herbs instead of table salt or sea salt.  Check with your health care provider or pharmacist before using salt substitutes.  Eat lower-sodium products. These are often labeled as "low-sodium" or "no salt added."  Eat fresh foods. Avoid eating a lot of canned foods.  Eat more vegetables, fruits, and low-fat dairy products.  Choose whole grains. Look for the word "whole" as the first word in the ingredient list.  Choose fish and skinless chicken or turkey more often than red meat. Limit fish, poultry, and meat to 6 oz (170 g) each day.  Limit sweets, desserts, sugars, and sugary drinks.  Choose heart-healthy fats.  Eat more home-cooked food and less restaurant, buffet, and fast food.  Limit fried foods.  Do not fry foods. Cook foods using methods such as baking, boiling, grilling, and broiling instead.  When eating at a restaurant, ask that your food be prepared with less salt, or no salt if possible. What foods can I eat? Seek help from a dietitian for individual calorie needs. Grains  Whole grain or whole wheat bread. Brown rice. Whole grain or whole wheat pasta. Quinoa, bulgur, and whole grain cereals. Low-sodium cereals. Corn or whole wheat flour tortillas. Whole grain cornbread. Whole grain crackers. Low-sodium crackers. Vegetables  Fresh or frozen vegetables (raw, steamed, roasted, or  grilled). Low-sodium or reduced-sodium tomato and vegetable juices. Low-sodium or reduced-sodium tomato sauce and paste. Low-sodium or reduced-sodium canned vegetables. Fruits  All fresh, canned (in natural juice), or frozen fruits. Meat and Other Protein Products  Ground beef (85% or leaner), grass-fed beef, or beef trimmed of fat. Skinless chicken or turkey. Ground chicken or turkey. Pork trimmed of fat. All fish and seafood. Eggs. Dried beans, peas, or lentils. Unsalted nuts and seeds. Unsalted canned beans. Dairy  Low-fat dairy products, such as skim or 1% milk, 2% or reduced-fat cheeses, low-fat ricotta or cottage cheese, or plain low-fat yogurt. Low-sodium or reduced-sodium cheeses. Fats and Oils  Tub margarines without trans fats. Light or reduced-fat mayonnaise and salad dressings (reduced sodium). Avocado. Safflower, olive, or canola oils. Natural peanut or almond butter. Other  Unsalted popcorn and pretzels. The items listed above may not be a complete list of recommended foods or beverages. Contact your dietitian for more options.  What foods are not recommended? Grains  White bread. White pasta. White rice. Refined cornbread. Bagels and croissants. Crackers that contain trans fat. Vegetables  Creamed or fried vegetables. Vegetables in a cheese sauce. Regular canned vegetables. Regular canned tomato sauce and paste. Regular tomato and vegetable juices. Fruits  Canned fruit in light or heavy syrup. Fruit juice. Meat and Other Protein Products  Fatty cuts of meat. Ribs, chicken wings, bacon, sausage, bologna, salami, chitterlings, fatback, hot dogs, bratwurst, and packaged luncheon meats. Salted nuts and seeds. Canned beans with salt. Dairy  Whole or 2% milk, cream, half-and-half, and cream cheese. Whole-fat or sweetened yogurt. Full-fat cheeses   or blue cheese. Nondairy creamers and whipped toppings. Processed cheese, cheese spreads, or cheese curds. Condiments  Onion and garlic  salt, seasoned salt, table salt, and sea salt. Canned and packaged gravies. Worcestershire sauce. Tartar sauce. Barbecue sauce. Teriyaki sauce. Soy sauce, including reduced sodium. Steak sauce. Fish sauce. Oyster sauce. Cocktail sauce. Horseradish. Ketchup and mustard. Meat flavorings and tenderizers. Bouillon cubes. Hot sauce. Tabasco sauce. Marinades. Taco seasonings. Relishes. Fats and Oils  Butter, stick margarine, lard, shortening, ghee, and bacon fat. Coconut, palm kernel, or palm oils. Regular salad dressings. Other  Pickles and olives. Salted popcorn and pretzels. The items listed above may not be a complete list of foods and beverages to avoid. Contact your dietitian for more information.  Where can I find more information? [employer]: www.nhlbi.nih.gov/health/health-topics/topics/dash/ This information is not intended to replace advice given to you by your health care provider. Make sure you discuss any questions you have with your health care provider. Document Released: 08/23/2011 Document Revised: 02/09/2016 Document Reviewed: 07/08/2013 [employer] Interactive Patient Education  2017 [employer] Hypertension Hypertension, commonly called high blood pressure, is when the force of blood pumping through your arteries is too strong. Your arteries are the blood vessels that carry blood from your heart throughout your body. A blood pressure reading consists of a higher number over a lower number, such as 110/72. The higher number (systolic) is the pressure inside your arteries when your heart pumps. The lower number (diastolic) is the pressure inside your arteries when your heart relaxes. Ideally you want your blood pressure below 120/80. Hypertension forces your heart to work harder to pump blood. Your arteries may become narrow or stiff. Having untreated or uncontrolled hypertension can cause heart attack, stroke, kidney disease, and other problems. What  increases the risk? Some risk factors for high blood pressure are controllable. Others are not. Risk factors you cannot control include:  Race. You may be at higher risk if you are African American.  Age. Risk increases with age.  Gender. Men are at higher risk than women before age 45 years. After age 65, women are at higher risk than men. Risk factors you can control include:  Not getting enough exercise or physical activity.  Being overweight.  Getting too much fat, sugar, calories, or salt in your diet.  Drinking too much alcohol. What are the signs or symptoms? Hypertension does not usually cause signs or symptoms. Extremely high blood pressure (hypertensive crisis) may cause headache, anxiety, shortness of breath, and nosebleed. How is this diagnosed? To check if you have hypertension, your health care provider will measure your blood pressure while you are seated, with your arm held at the level of your heart. It should be measured at least twice using the same arm. Certain conditions can cause a difference in blood pressure between your right and left arms. A blood pressure reading that is higher than normal on one occasion does not mean that you need treatment. If it is not clear whether you have high blood pressure, you may be asked to return on a different day to have your blood pressure checked again. Or, you may be asked to monitor your blood pressure at home for 1 or more weeks. How is this treated? Treating high blood pressure includes making lifestyle changes and possibly taking medicine. Living a healthy lifestyle can help lower high blood pressure. You may need to change some of your habits. Lifestyle changes may include:  Following the DASH diet. This   diet is high in fruits, vegetables, and whole grains. It is low in salt, red meat, and added sugars.  Keep your sodium intake below 2,300 mg per day.  Getting at least 30-45 minutes of aerobic exercise at least 4 times  per week.  Losing weight if necessary.  Not smoking.  Limiting alcoholic beverages.  Learning ways to reduce stress. Your health care provider may prescribe medicine if lifestyle changes are not enough to get your blood pressure under control, and if one of the following is true:  You are 18-59 years of age and your systolic blood pressure is above 140.  You are 60 years of age or older, and your systolic blood pressure is above 150.  Your diastolic blood pressure is above 90.  You have diabetes, and your systolic blood pressure is over 140 or your diastolic blood pressure is over 90.  You have kidney disease and your blood pressure is above 140/90.  You have heart disease and your blood pressure is above 140/90. Your personal target blood pressure may vary depending on your medical conditions, your age, and other factors. Follow these instructions at home:  Have your blood pressure rechecked as directed by your health care provider.  Take medicines only as directed by your health care provider. Follow the directions carefully. Blood pressure medicines must be taken as prescribed. The medicine does not work as well when you skip doses. Skipping doses also puts you at risk for problems.  Do not smoke.  Monitor your blood pressure at home as directed by your health care provider. Contact a health care provider if:  You think you are having a reaction to medicines taken.  You have recurrent headaches or feel dizzy.  You have swelling in your ankles.  You have trouble with your vision. Get help right away if:  You develop a severe headache or confusion.  You have unusual weakness, numbness, or feel faint.  You have severe chest or abdominal pain.  You vomit repeatedly.  You have trouble breathing. This information is not intended to replace advice given to you by your health care provider. Make sure you discuss any questions you have with your health care  provider. Document Released: 09/03/2005 Document Revised: 02/09/2016 Document Reviewed: 06/26/2013 [employer] Interactive Patient Education  2017 [employer]  

## 2016-08-02 NOTE — Progress Notes (Signed)
Subjective:    Patient ID: Mary Raymond, female    DOB: 26-Jun-1951, 65 y.o.   MRN: [id number]  Pt presents to the office today for chronic follow up.  She has not followed up with opthalmology or cardiology as instructed due to financial concerns. Diabetes  She presents for her follow-up diabetic visit. She has type 2 diabetes mellitus. No MedicAlert identification noted. Her disease course has been stable. Hypoglycemia symptoms include nervousness/anxiousness and speech difficulty. Pertinent negatives for hypoglycemia include no confusion, dizziness or mood changes. Associated symptoms include blurred vision and fatigue. Pertinent negatives for diabetes include no chest pain, no foot paresthesias, no foot ulcerations, no visual change and no weight loss. Pertinent negatives for hypoglycemia complications include no nocturnal hypoglycemia. Symptoms are stable. Diabetic complications include peripheral neuropathy. Pertinent negatives for diabetic complications include no CVA, heart disease or nephropathy. Risk factors for coronary artery disease include diabetes mellitus, dyslipidemia, hypertension, obesity, post-menopausal, sedentary lifestyle and family history. Current diabetic treatment includes insulin injections and oral agent (dual therapy). She is compliant with treatment all of the time. Her weight is decreasing steadily. She is following a low salt and generally unhealthy diet. Meal planning includes avoidance of concentrated sweets. She never participates in exercise. Her breakfast blood glucose range is generally 130-140 mg/dl. An ACE inhibitor/angiotensin II receptor blocker is being taken. Eye exam is not current.  Hypertension  This is a chronic problem. The current episode started more than 1 year ago. The problem has been waxing and waning since onset. The problem is uncontrolled. Associated symptoms include anxiety, blurred vision and peripheral edema (At times). Pertinent negatives  include no chest pain or palpitations. Risk factors for coronary artery disease include diabetes mellitus, dyslipidemia, family history, obesity, post-menopausal state and sedentary lifestyle. Past treatments include calcium channel blockers and angiotensin blockers. The current treatment provides mild improvement. Compliance problems include exercise.  Hypertensive end-organ damage includes a thyroid problem. There is no history of kidney disease, CAD/MI, CVA or heart failure. There is no history of sleep apnea.  Depression       The patient presents with depression.  This is a chronic problem.  The current episode started more than 1 year ago.   The onset quality is gradual.   The problem occurs constantly.  The problem has been waxing and waning since onset.  Associated symptoms include fatigue, helplessness, hopelessness, appetite change and sad.  Associated symptoms include no restlessness, no myalgias and no suicidal ideas.  Past treatments include SSRIs - Selective serotonin reuptake inhibitors.  Compliance with treatment is good.  Past medical history includes hypothyroidism, thyroid problem, anxiety and depression.   Hyperlipidemia  This is a chronic problem. The current episode started more than 1 year ago. The problem is uncontrolled. Recent lipid tests were reviewed and are high. Exacerbating diseases include diabetes, hypothyroidism and obesity. Factors aggravating her hyperlipidemia include fatty foods. Associated symptoms include leg pain. Pertinent negatives include no chest pain or myalgias. Current antihyperlipidemic treatment includes statins. The current treatment provides moderate improvement of lipids. Compliance problems include adherence to exercise.  Risk factors for coronary artery disease include diabetes mellitus, dyslipidemia, hypertension, obesity, post-menopausal and a sedentary lifestyle.  Anxiety  Presents for follow-up visit. Onset was more than 5 years ago. The problem has  been gradually worsening. Symptoms include depressed mood, excessive worry, irritability, nervous/anxious behavior and panic. Patient reports no chest pain, confusion, dizziness, palpitations, restlessness or suicidal ideas. Symptoms occur most days. The severity of symptoms is  moderate. The symptoms are aggravated by family issues. The quality of sleep is good.   Her past medical history is significant for anxiety/panic attacks, asthma and depression. Past treatments include SSRIs. The treatment provided mild relief. Compliance with prior treatments has been good.  Gastroesophageal Reflux  She reports no chest pain, no heartburn or no wheezing. This is a chronic problem. The current episode started more than 1 year ago. The problem occurs rarely. The problem has been waxing and waning. The symptoms are aggravated by certain foods. Associated symptoms include fatigue. Pertinent negatives include no weight loss. Risk factors include lack of exercise and caffeine use. She has tried a PPI for the symptoms. The treatment provided mild relief.  Asthma  She complains of frequent throat clearing. There is no sputum production or wheezing. This is a chronic problem. The current episode started more than 1 year ago. The problem occurs intermittently. The problem has been unchanged. The cough is non-productive. Associated symptoms include appetite change, ear congestion, nasal congestion and rhinorrhea. Pertinent negatives include no chest pain, heartburn, myalgias or weight loss. Her symptoms are aggravated by exercise. Her symptoms are alleviated by rest and leukotriene antagonist. She reports moderate improvement on treatment. Her symptoms are not alleviated by rest. Her past medical history is significant for asthma.  Thyroid Problem  Presents for follow-up visit. Symptoms include anxiety, depressed mood, dry skin and fatigue. Patient reports no constipation, diarrhea, palpitations, visual change or weight loss.  The symptoms have been worsening. Past treatments include levothyroxine. The treatment provided moderate relief. Her past medical history is significant for diabetes and hyperlipidemia. There is no history of heart failure.  Back Pain  This is a chronic problem. The current episode started more than 1 year ago. The problem occurs constantly. The problem has been waxing and waning since onset. The pain is present in the lumbar spine. The quality of the pain is described as aching. The pain is at a severity of 10/10. The pain is severe. The symptoms are aggravated by standing. Associated symptoms include leg pain and numbness. Pertinent negatives include no chest pain or weight loss. Risk factors include obesity. She has tried analgesics for the symptoms. The treatment provided moderate relief.  Neuropathy Pt states she has bilateral numbness and tingling in her feet intermittently.  Genital Herpes PT currently taking acyclvir 400 mg daily. Stable  Review of Systems  Constitutional: Positive for appetite change, fatigue and irritability. Negative for weight loss.  HENT: Positive for rhinorrhea.   Eyes: Positive for blurred vision and visual disturbance.  Respiratory: Negative for sputum production and wheezing.   Cardiovascular: Positive for leg swelling. Negative for chest pain and palpitations.  Gastrointestinal: Negative for constipation, diarrhea and heartburn.  Endocrine: Negative.   Musculoskeletal: Positive for back pain. Negative for myalgias.  Neurological: Positive for speech difficulty and numbness. Negative for dizziness.  Hematological: Negative.   Psychiatric/Behavioral: Positive for depression. Negative for confusion and suicidal ideas. The patient is nervous/anxious.   All other systems reviewed and are negative.  Pain assessment: Cause of pain- Chronic back pain Pain location- Lower back pain Pain on scale of 1-10- 10 Frequency- Constant What increases pain-Standing What  makes pain Better-Sitting  Current medications- Norco 7.5-325 mg BID Effectiveness of current meds-Pt continues to have pain   Pill count performed-No Urine drug screen- No, 05/01/16 Was the [facility] reviewed- Yes  If yes were their any concerning findings? - Only received pain medication from me     Objective:  Physical Exam  Constitutional: She is oriented to person, place, and time. She appears well-developed and well-nourished. No distress.  Morbid obese   HENT:  Head: Normocephalic and atraumatic.  Right Ear: Tympanic membrane and external ear normal.  Left Ear: Tympanic membrane and external ear normal.  Nose: Right sinus exhibits maxillary sinus tenderness. Right sinus exhibits no frontal sinus tenderness. Left sinus exhibits maxillary sinus tenderness. Left sinus exhibits no frontal sinus tenderness.  Mouth/Throat: Uvula is midline.  Nasal passage erythemas with mild swelling    Eyes: Conjunctivae, EOM and lids are normal. Pupils are equal, round, and reactive to light.  Neck: Normal range of motion. Neck supple. Decreased carotid pulses present. Carotid bruit is not present. No thyromegaly present.  Cardiovascular: Normal rate, regular rhythm, normal heart sounds and intact distal pulses.   No murmur heard. Pulmonary/Chest: Effort normal and breath sounds normal. No respiratory distress. She has no wheezes.  Abdominal: Soft. Bowel sounds are normal. She exhibits no distension. There is no tenderness.  Musculoskeletal: She exhibits edema (trace in BLE). She exhibits no tenderness.  PT is in wheelchair  Lymphadenopathy:       Head (right side): No submental, no submandibular and no posterior auricular adenopathy present.       Head (left side): No submental, no submandibular and no posterior auricular adenopathy present.    She has no cervical adenopathy.  Neurological: She is alert and oriented to person, place, and time. She has normal reflexes. No cranial nerve deficit.    Skin: Skin is warm and dry.  Psychiatric: She has a normal mood and affect. Her behavior is normal. Judgment and thought content normal.  Vitals reviewed.     BP (!) 164/74   Pulse 62   Temp 97.5 F (36.4 C) (Oral)   Ht _0  (1.651 m)   Wt 293 lb 6.4 oz (133.1 kg)   BMI 48.82 kg/m      Assessment & Plan:  1. Vitamin D deficiency - CMP14+EGFR  2. Pain medication agreement signed - HYDROcodone-acetaminophen (NORCO) 7.5-325 MG tablet; Take 1 tablet by mouth every 12 (twelve) hours as needed for moderate pain.  Dispense: 60 tablet; Refill: 0 - HYDROcodone-acetaminophen (NORCO) 7.5-325 MG tablet; Take 1 tablet by mouth every 12 (twelve) hours as needed for moderate pain.  Dispense: 60 tablet; Refill: 0 - HYDROcodone-acetaminophen (NORCO) 7.5-325 MG tablet; Take 1 tablet by mouth every 12 (twelve) hours as needed for moderate pain.  Dispense: 60 tablet; Refill: 0 - CMP14+EGFR  3. Obesity, morbid, BMI 40.0-49.9 (HCC) - HYDROcodone-acetaminophen (NORCO) 7.5-325 MG tablet; Take 1 tablet by mouth every 12 (twelve) hours as needed for moderate pain.  Dispense: 60 tablet; Refill: 0 - HYDROcodone-acetaminophen (NORCO) 7.5-325 MG tablet; Take 1 tablet by mouth every 12 (twelve) hours as needed for moderate pain.  Dispense: 60 tablet; Refill: 0 - HYDROcodone-acetaminophen (NORCO) 7.5-325 MG tablet; Take 1 tablet by mouth every 12 (twelve) hours as needed for moderate pain.  Dispense: 60 tablet; Refill: 0 - CMP14+EGFR  4. Essential hypertension - CMP14+EGFR - metoprolol succinate (TOPROL-XL) 50 MG 24 hr tablet; Take 1 tablet (50 mg total) by mouth daily. Take with or immediately following a meal.  Dispense: 90 tablet; Refill: 1 - Ambulatory referral to Cardiology  5. Uncomplicated asthma, unspecified asthma severity, unspecified whether persistent - CMP14+EGFR  6. Gastroesophageal reflux disease, esophagitis presence not specified  - CMP14+EGFR  7. Uncontrolled type 2 diabetes  mellitus without complication, with long-term current use of insulin ([place]) -  CMP14+EGFR - Bayer DCA Hb A1c Waived - Ambulatory referral to Cardiology  8. Hypothyroidism, unspecified type  - CMP14+EGFR  9. Neuropathy ([place])  - CMP14+EGFR  10. Primary osteoarthritis involving multiple joints - HYDROcodone-acetaminophen (NORCO) 7.5-325 MG tablet; Take 1 tablet by mouth every 12 (twelve) hours as needed for moderate pain.  Dispense: 60 tablet; Refill: 0 - HYDROcodone-acetaminophen (NORCO) 7.5-325 MG tablet; Take 1 tablet by mouth every 12 (twelve) hours as needed for moderate pain.  Dispense: 60 tablet; Refill: 0 - HYDROcodone-acetaminophen (NORCO) 7.5-325 MG tablet; Take 1 tablet by mouth every 12 (twelve) hours as needed for moderate pain.  Dispense: 60 tablet; Refill: 0 - CMP14+EGFR - Ambulatory referral to Orthopedic Surgery  11. Chronic bilateral low back pain with bilateral sciatica - HYDROcodone-acetaminophen (NORCO) 7.5-325 MG tablet; Take 1 tablet by mouth every 12 (twelve) hours as needed for moderate pain.  Dispense: 60 tablet; Refill: 0 - HYDROcodone-acetaminophen (NORCO) 7.5-325 MG tablet; Take 1 tablet by mouth every 12 (twelve) hours as needed for moderate pain.  Dispense: 60 tablet; Refill: 0 - HYDROcodone-acetaminophen (NORCO) 7.5-325 MG tablet; Take 1 tablet by mouth every 12 (twelve) hours as needed for moderate pain.  Dispense: 60 tablet; Refill: 0 - CMP14+EGFR - Ambulatory referral to Orthopedic Surgery  12. Anxiety  - CMP14+EGFR - escitalopram (LEXAPRO) 10 MG tablet; Take 1 tablet (10 mg total) by mouth daily.  Dispense: 90 tablet; Refill: 3 - ALPRAZolam (XANAX) 0.5 MG tablet; Take 1 tablet (0.5 mg total) by mouth 2 (two) times daily as needed for anxiety.  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 Hyperlipidemia, unspecified hyperlipidemia type - CMP14+EGFR -  Lipid panel - Ambulatory referral to Cardiology  15. Uncomplicated opioid dependence ([place]) - HYDROcodone-acetaminophen (NORCO) 7.5-325 MG tablet; Take 1 tablet by mouth every 12 (twelve) hours as needed for moderate pain.  Dispense: 60 tablet; Refill: 0 - HYDROcodone-acetaminophen (NORCO) 7.5-325 MG tablet; Take 1 tablet by mouth every 12 (twelve) hours as needed for moderate pain.  Dispense: 60 tablet; Refill: 0 - HYDROcodone-acetaminophen (NORCO) 7.5-325 MG tablet; Take 1 tablet by mouth every 12 (twelve) hours as needed for moderate pain.  Dispense: 60 tablet; Refill: 0 - CMP14+EGFR  16. Genital herpes simplex, unspecified site - CMP14+EGFR  17. Moderate episode of recurrent major depressive disorder (HCC) - escitalopram (LEXAPRO) 10 MG tablet; Take 1 tablet (10 mg total) by mouth daily.  Dispense: 90 tablet; Refill: 3   Continue all meds Labs pending Health Maintenance reviewed Diet and exercise encouraged RTO 2 weeks to recheck HTN  Evelina Dun, FNP

## 2016-08-03 ENCOUNTER — Other Ambulatory Visit: Payer: Self-pay | Admitting: Family

## 2016-08-03 ENCOUNTER — Encounter: Payer: Self-pay | Admitting: Cardiovascular Disease

## 2016-08-03 LAB — CMP14+EGFR
ALBUMIN: 3.6 g/dL (ref 3.6–4.8)
ALK PHOS: 93 IU/L (ref 39–117)
ALT: 11 IU/L (ref 0–32)
AST: 11 IU/L (ref 0–40)
Albumin/Globulin Ratio: 1.3 (ref 1.2–2.2)
BUN / CREAT RATIO: 26 (ref 12–28)
BUN: 20 mg/dL (ref 8–27)
Bilirubin Total: 0.3 mg/dL (ref 0.0–1.2)
CO2: 26 mmol/L (ref 18–29)
CREATININE: 0.77 mg/dL (ref 0.57–1.00)
Calcium: 9.3 mg/dL (ref 8.7–10.3)
Chloride: 99 mmol/L (ref 96–106)
GFR calc non Af Amer: 82 mL/min/{1.73_m2} (ref >59)
GFR, EST AFRICAN AMERICAN: 94 mL/min/{1.73_m2} (ref >59)
GLOBULIN, TOTAL: 2.8 g/dL (ref 1.5–4.5)
Glucose: 219 mg/dL — ABNORMAL HIGH (ref 65–99)
Potassium: 5.3 mmol/L — ABNORMAL HIGH (ref 3.5–5.2)
SODIUM: 141 mmol/L (ref 134–144)
TOTAL PROTEIN: 6.4 g/dL (ref 6.0–8.5)

## 2016-08-03 LAB — LIPID PANEL
CHOL/HDL RATIO: 5.9 ratio — AB (ref 0.0–4.4)
CHOLESTEROL TOTAL: 260 mg/dL — AB (ref 100–199)
HDL: 44 mg/dL (ref >39)
LDL CALC: 137 mg/dL — AB (ref 0–99)
Triglycerides: 396 mg/dL — ABNORMAL HIGH (ref 0–149)
VLDL CHOLESTEROL CAL: 79 mg/dL — AB (ref 5–40)

## 2016-08-03 MED ORDER — PITAVASTATIN CALCIUM 2 MG PO TABS: 2.0000 mg | ORAL_TABLET | Freq: Every day | ORAL | 1 refills | Status: DC

## 2016-09-11 ENCOUNTER — Other Ambulatory Visit: Payer: Self-pay | Admitting: Family

## 2016-09-11 ENCOUNTER — Telehealth: Payer: Self-pay | Admitting: Family

## 2016-09-11 DIAGNOSIS — IMO0001 Reserved for inherently not codable concepts without codable children: Secondary | ICD-10-CM

## 2016-09-11 DIAGNOSIS — E1165 Type 2 diabetes mellitus with hyperglycemia: Principal | ICD-10-CM

## 2016-09-11 DIAGNOSIS — Z794 Long term (current) use of insulin: Principal | ICD-10-CM

## 2016-09-11 NOTE — Telephone Encounter (Signed)
done

## 2016-09-18 ENCOUNTER — Other Ambulatory Visit: Payer: Self-pay | Admitting: Family

## 2016-09-18 DIAGNOSIS — IMO0001 Reserved for inherently not codable concepts without codable children: Secondary | ICD-10-CM

## 2016-09-18 DIAGNOSIS — Z794 Long term (current) use of insulin: Principal | ICD-10-CM

## 2016-09-18 DIAGNOSIS — E1165 Type 2 diabetes mellitus with hyperglycemia: Principal | ICD-10-CM

## 2016-09-18 NOTE — Progress Notes (Deleted)
   Cardiology Office Note   Date:  09/18/2016   ID:  Mary Raymond, DOB 11-06-1950, MRN [id number]  PCP:  Evelina Dun, FNP  Cardiologist:   Dorris Carnes, MD       History of Present Illness: Mary Raymond is a 66 y.o. female with a history of HTN, HL, DM         No outpatient prescriptions have been marked as taking for the 09/19/16 encounter (Appointment) with Fay Records, MD.     Allergies:   Atorvastatin   Past Medical History:  Diagnosis Date  . Allergy   . Anxiety   . Asthma   . Diabetes mellitus without complication ([place])   . Gout   . Hyperlipidemia   . Hypertension   . Hypothyroid   . Neuropathy ([place])   . Obesity   . Vitamin D deficiency     Past Surgical History:  Procedure Laterality Date  . ABDOMINAL HYSTERECTOMY    . BREAST LUMPECTOMY    . rectal fissure       Social History:  The patient  reports that she has never smoked. She has never used smokeless tobacco. She reports that she does not drink alcohol or use drugs.   Family History:  The patient's family history includes Asthma in her sister, sister, and sister; Cancer in her mother and sister; Dementia in her mother; Diabetes in her mother and sister; Heart disease in her father; Hypertension in her mother; Scoliosis in her sister.    ROS:  Please see the history of present illness. All other systems are reviewed and  Negative to the above problem except as noted.    PHYSICAL EXAM: VS:  There were no vitals taken for this visit.  GEN: Well nourished, well developed, in no acute distress  HEENT: normal  Neck: no JVD, carotid bruits, or masses Cardiac: RRR; no murmurs, rubs, or gallops,no edema  Respiratory:  clear to auscultation bilaterally, normal work of breathing GI: soft, nontender, nondistended, + BS  No hepatomegaly  MS: no deformity Moving all extremities   Skin: warm and dry, no rash Neuro:  Strength and sensation are intact Psych: euthymic mood, full affect   EKG:   EKG is ordered today.   Lipid Panel    Component Value Date/Time   CHOL 260 (H) 08/02/2016 1512   CHOL 248 (H) 02/23/2013 1159   TRIG 396 (H) 08/02/2016 1512   TRIG 274 (H) 09/16/2013 0846   TRIG 359 (H) 02/23/2013 1159   HDL 44 08/02/2016 1512   HDL 45 09/16/2013 0846   HDL 45 02/23/2013 1159   CHOLHDL 5.9 (H) 08/02/2016 1512   LDLCALC 137 (H) 08/02/2016 1512   LDLCALC 145 (H) 09/16/2013 0846   LDLCALC 131 (H) 02/23/2013 1159      Wt Readings from Last 3 Encounters:  08/02/16 293 lb 6.4 oz (133.1 kg)  05/01/16 291 lb 3.2 oz (132.1 kg)  04/20/16 280 lb (127 kg)      ASSESSMENT AND PLAN:     Current medicines are reviewed at length with the patient today.  The patient does not have concerns regarding medicines.  Signed, Dorris Carnes, MD  09/18/2016 8:42 PM    [address], [place], [place]  [address] Phone: [phone]; Fax: [phone]

## 2016-09-19 ENCOUNTER — Ambulatory Visit: Payer: Medicare PPO | Admitting: Internal Medicine

## 2016-09-19 ENCOUNTER — Encounter: Payer: Self-pay | Admitting: Internal Medicine

## 2016-10-01 ENCOUNTER — Other Ambulatory Visit: Payer: Self-pay | Admitting: Family

## 2016-10-01 DIAGNOSIS — I10 Essential (primary) hypertension: Secondary | ICD-10-CM

## 2016-10-01 DIAGNOSIS — J45909 Unspecified asthma, uncomplicated: Secondary | ICD-10-CM

## 2016-10-03 ENCOUNTER — Other Ambulatory Visit: Payer: Self-pay | Admitting: Family

## 2016-10-03 DIAGNOSIS — E039 Hypothyroidism, unspecified: Secondary | ICD-10-CM

## 2016-10-16 ENCOUNTER — Other Ambulatory Visit: Payer: Self-pay | Admitting: Family

## 2016-10-16 DIAGNOSIS — I10 Essential (primary) hypertension: Secondary | ICD-10-CM

## 2016-11-02 ENCOUNTER — Ambulatory Visit (INDEPENDENT_AMBULATORY_CARE_PROVIDER_SITE_OTHER): Payer: Medicare PPO | Admitting: Family Medicine

## 2016-11-02 ENCOUNTER — Encounter: Payer: Self-pay | Admitting: Family Medicine

## 2016-11-02 VITALS — BP 165/73 | HR 85 | Temp 97.2°F | Ht 65.0 in | Wt 286.0 lb

## 2016-11-02 DIAGNOSIS — H6693 Otitis media, unspecified, bilateral: Secondary | ICD-10-CM

## 2016-11-02 DIAGNOSIS — R300 Dysuria: Secondary | ICD-10-CM

## 2016-11-02 LAB — URINALYSIS
Bilirubin, UA: NEGATIVE
Ketones, UA: NEGATIVE
Nitrite, UA: NEGATIVE
PH UA: 5.5 (ref 5.0–7.5)
Specific Gravity, UA: 1.025 (ref 1.005–1.030)
Urobilinogen, Ur: 0.2 mg/dL (ref 0.2–1.0)

## 2016-11-02 MED ORDER — SULFAMETHOXAZOLE-TRIMETHOPRIM 800-160 MG PO TABS: 1.0000 | ORAL_TABLET | Freq: Two times a day (BID) | ORAL | 0 refills | Status: DC

## 2016-11-02 NOTE — Progress Notes (Signed)
Subjective:    Patient ID: Mary Raymond, female    DOB: [birth year], 66 y.o.   MRN: [id number]  HPI 66 year old female who presents with some dysuria started today. She also complains of bilateral ear pain headache and sore throat. She is a diabetic and has had some issues recently with low blood sugars and staggering. Related to her diabetes she is on rather high doses of Lantus as well as NovoLog. Will ask her to see our clinical pharmacologist to help get her diabetes better managed.  Patient Active Problem List   Diagnosis Date Noted  . Genital herpes 08/02/2016  . Pain medication agreement signed 05/01/2016  . Uncomplicated opioid dependence ([place]) 05/01/2016  . Chronic back pain 08/10/2015  . 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 Depression 11/15/2014  . GERD (gastroesophageal reflux disease) 11/15/2014  . Hypertension   . Hyperlipidemia   . Diabetes mellitus type 2, uncontrolled, without complications ([place])   . Anxiety   . Obesity, morbid, BMI 40.0-49.9 ([place])   . Allergy   . Asthma   . Vitamin D deficiency   . Neuropathy ([place])   . Hypothyroidism 02/25/2013   Outpatient Encounter Prescriptions as of 11/02/2016  Medication Sig  . acyclovir (ZOVIRAX) 400 MG tablet TAKE 1 TABLET EVERY DAY  . ALPRAZolam (XANAX) 0.5 MG tablet Take 1 tablet (0.5 mg total) by mouth 2 (two) times daily as needed for anxiety.  Marland Kitchen amLODipine (NORVASC) 10 MG tablet TAKE 1 TABLET EVERY DAY  . aspirin 81 MG tablet Take 81 mg by mouth daily.  . B-D UF III MINI PEN NEEDLES 31G X 5 MM MISC USE AS INSTRUCTED TO GIVE LANTUS INJECTION TWICE DAILY  . Blood Glucose Monitoring Suppl (TRUE METRIX METER) w/Device KIT Use device to check blood sugar up to 5 times qd. E11.65  . busPIRone (BUSPAR) 15 MG tablet TAKE 1/2 TABLET TWICE DAILY AS DIRECTED  . escitalopram (LEXAPRO) 10 MG tablet Take 1 tablet (10 mg total) by mouth daily.  . fluticasone (FLONASE) 50 MCG/ACT nasal spray Place 2  sprays into both nostrils daily.  . furosemide (LASIX) 20 MG tablet TAKE 1 TABLET EVERY DAY  . glucose blood (TRUE METRIX BLOOD GLUCOSE TEST) test strip Test up to 5 times qd. Dx E11.65  . HYDROcodone-acetaminophen (NORCO) 7.5-325 MG tablet Take 1 tablet by mouth every 12 (twelve) hours as needed for moderate pain.  Marland Kitchen HYDROcodone-acetaminophen (NORCO) 7.5-325 MG tablet Take 1 tablet by mouth every 12 (twelve) hours as needed for moderate pain.  Marland Kitchen HYDROcodone-acetaminophen (NORCO) 7.5-325 MG tablet Take 1 tablet by mouth every 12 (twelve) hours as needed for moderate pain.  Marland Kitchen insulin lispro (HUMALOG) 100 UNIT/ML injection Inject 0.45 mLs (45 Units total) into the skin 3 (three) times daily with meals.  . Ipratropium-Albuterol (COMBIVENT RESPIMAT) 20-100 MCG/ACT AERS respimat Inhale 1 puff into the lungs every 6 (six) hours as needed for wheezing.  Elmore Guise Devices (ONE TOUCH DELICA LANCING DEV) MISC CHECK BLOOD SUGAR 5 TIMES DAILY  . LANTUS SOLOSTAR 100 UNIT/ML Solostar Pen INJECT 45 UNITS INTO THE SKIN 2 TIMES DAILY.  Marland Kitchen levothyroxine (SYNTHROID, LEVOTHROID) 50 MCG tablet TAKE 1 TABLET EVERY DAY BEFORE BREAKFAST  . losartan (COZAAR) 100 MG tablet TAKE 1 TABLET EVERY DAY  . meclizine (ANTIVERT) 25 MG tablet TAKE 1 TABLET THREE TIMES DAILY AS NEEDED  . metFORMIN (GLUCOPHAGE) 500 MG tablet TAKE 2 TABLETS TWICE DAILY WITH A MEAL  . metoprolol succinate (TOPROL-XL) 50 MG 24 hr tablet  TAKE 1 TABLET DAILY WITH OR IMMEDIATELY FOLLOWING A MEAL.  . montelukast (SINGULAIR) 10 MG tablet TAKE 1 TABLET AT BEDTIME  . NOVOLOG FLEXPEN 100 UNIT/ML FlexPen INJECT  45 UNITS SUBCUTANEOUSLY THREE TIMES DAILY WITH MEALS  . omeprazole (PRILOSEC) 40 MG capsule TAKE 1 CAPSULE EVERY DAY  . Pitavastatin Calcium (LIVALO) 2 MG TABS Take 1 tablet (2 mg total) by mouth daily.  . SYMBICORT 160-4.5 MCG/ACT inhaler INHALE 2 PUFFS INTO THE LUNGS TWICE DAILY.  Marland Kitchen TRUEPLUS LANCETS 33G MISC TEST UP TO 5 TIMES EVERY DAY   No  facility-administered encounter medications on file as of 11/02/2016.       Review of Systems  Constitutional: Negative.   HENT: Positive for ear pain.   Respiratory: Negative.   Cardiovascular: Negative.   Neurological: Negative.   Psychiatric/Behavioral: Negative.        Objective:   Physical Exam  Constitutional: She appears well-developed.  HENT:  Mouth/Throat: Oropharynx is clear and moist.  Tympanic membranes are dull  Pulmonary/Chest: Effort normal and breath sounds normal.   BP (!) 165/73   Pulse 85   Temp 97.2 F (36.2 C) (Oral)   Ht '5\' 5"'$  (1.651 m)   Wt 286 lb (129.7 kg)   BMI 47.59 kg/m         Assessment & Plan:  1. Dysuria Urinalysis has some leukocytes blood and protein. This will be cultured but began Septra pending result of culture - Urinalysis - Urine culture  2. Otitis of both ears This appears to be a serous otitis but she also complains of some sinus pain and pressure. Sulfa which she was given pending urine culture should also cover sinus  Wardell Honour MD

## 2016-11-03 ENCOUNTER — Other Ambulatory Visit: Payer: Self-pay | Admitting: Family Medicine

## 2016-11-03 MED ORDER — SULFAMETHOXAZOLE-TRIMETHOPRIM 800-160 MG PO TABS: 1.0000 | ORAL_TABLET | Freq: Two times a day (BID) | ORAL | 0 refills | Status: DC

## 2016-11-03 NOTE — Telephone Encounter (Signed)
After hours call, pt lost medicine. Re-sent bactrim.   Laroy Apple, MD [facility] 11/03/2016, 12:48 PM

## 2016-11-05 LAB — URINE CULTURE

## 2016-11-06 ENCOUNTER — Other Ambulatory Visit: Payer: Self-pay | Admitting: *Deleted

## 2016-11-06 MED ORDER — NITROFURANTOIN MONOHYD MACRO 100 MG PO CAPS: 100.0000 mg | ORAL_CAPSULE | Freq: Two times a day (BID) | ORAL | 0 refills | Status: DC

## 2016-11-13 ENCOUNTER — Other Ambulatory Visit: Payer: Self-pay | Admitting: Family Medicine

## 2016-11-13 ENCOUNTER — Other Ambulatory Visit: Payer: Self-pay | Admitting: Family

## 2016-11-14 ENCOUNTER — Other Ambulatory Visit: Payer: Self-pay | Admitting: Family

## 2016-11-14 DIAGNOSIS — E1165 Type 2 diabetes mellitus with hyperglycemia: Principal | ICD-10-CM

## 2016-11-14 DIAGNOSIS — IMO0001 Reserved for inherently not codable concepts without codable children: Secondary | ICD-10-CM

## 2016-11-14 DIAGNOSIS — Z794 Long term (current) use of insulin: Principal | ICD-10-CM

## 2016-11-20 ENCOUNTER — Other Ambulatory Visit: Payer: Self-pay | Admitting: Family

## 2016-12-17 ENCOUNTER — Other Ambulatory Visit: Payer: Self-pay | Admitting: Family

## 2016-12-21 ENCOUNTER — Other Ambulatory Visit: Payer: Self-pay | Admitting: Family

## 2016-12-21 DIAGNOSIS — I10 Essential (primary) hypertension: Secondary | ICD-10-CM

## 2016-12-31 ENCOUNTER — Telehealth: Payer: Self-pay | Admitting: Family

## 2016-12-31 NOTE — Telephone Encounter (Signed)
Note ready for pick up 

## 2016-12-31 NOTE — Telephone Encounter (Signed)
Patient states that she is to do [employer] next week starting 01/07/2017.  Patient feels that she can not do jury duty due to medical problems.

## 2016-12-31 NOTE — Telephone Encounter (Signed)
Patient aware letter is ready for pick up.

## 2017-01-18 ENCOUNTER — Other Ambulatory Visit: Payer: Self-pay | Admitting: Family Medicine

## 2017-01-23 ENCOUNTER — Other Ambulatory Visit: Payer: Self-pay | Admitting: Family

## 2017-01-23 DIAGNOSIS — F419 Anxiety disorder, unspecified: Secondary | ICD-10-CM

## 2017-01-23 DIAGNOSIS — Z794 Long term (current) use of insulin: Principal | ICD-10-CM

## 2017-01-23 DIAGNOSIS — IMO0001 Reserved for inherently not codable concepts without codable children: Secondary | ICD-10-CM

## 2017-01-23 DIAGNOSIS — E1165 Type 2 diabetes mellitus with hyperglycemia: Principal | ICD-10-CM

## 2017-01-24 ENCOUNTER — Other Ambulatory Visit: Payer: Self-pay | Admitting: *Deleted

## 2017-01-24 MED ORDER — BD SWAB SINGLE USE REGULAR PADS: MEDICATED_PAD | 0 refills | Status: DC

## 2017-01-24 MED ORDER — ACCU-CHEK SMARTVIEW CONTROL VI LIQD: 0 refills | Status: DC

## 2017-01-24 NOTE — Telephone Encounter (Signed)
Last filled 12/04/16, last seen 08/02/16. Route to pool

## 2017-01-24 NOTE — Telephone Encounter (Signed)
Patient NTBS for follow up and lab work  

## 2017-01-24 NOTE — Telephone Encounter (Signed)
Rx called in. Left patient message - rx sent and NTBS for follow up.

## 2017-01-31 ENCOUNTER — Other Ambulatory Visit: Payer: Self-pay

## 2017-01-31 MED ORDER — ACCU-CHEK SOFTCLIX LANCET DEV MISC: 0 refills | Status: DC

## 2017-01-31 MED ORDER — ACCU-CHEK NANO SMARTVIEW W/DEVICE KIT: 1.0000 | PACK | Freq: Two times a day (BID) | 0 refills | Status: DC

## 2017-01-31 MED ORDER — GLUCOSE BLOOD VI STRP: ORAL_STRIP | 0 refills | Status: DC

## 2017-02-07 ENCOUNTER — Telehealth: Payer: Self-pay | Admitting: Family

## 2017-02-07 NOTE — Telephone Encounter (Signed)
Patient aware that rx sent on 5/10.

## 2017-02-18 ENCOUNTER — Other Ambulatory Visit: Payer: Self-pay | Admitting: Family

## 2017-02-18 DIAGNOSIS — E1165 Type 2 diabetes mellitus with hyperglycemia: Principal | ICD-10-CM

## 2017-02-18 DIAGNOSIS — Z794 Long term (current) use of insulin: Secondary | ICD-10-CM

## 2017-02-18 DIAGNOSIS — IMO0001 Reserved for inherently not codable concepts without codable children: Secondary | ICD-10-CM

## 2017-02-19 MED ORDER — INSULIN GLARGINE 100 UNIT/ML SOLOSTAR PEN: PEN_INJECTOR | SUBCUTANEOUS | 0 refills | Status: DC

## 2017-02-19 NOTE — Telephone Encounter (Signed)
Last visit for chronic health ,diabetes, was 08-2016 with a normal A1C.   Please advise on refills.

## 2017-02-23 ENCOUNTER — Other Ambulatory Visit: Payer: Self-pay | Admitting: Family

## 2017-02-26 ENCOUNTER — Other Ambulatory Visit: Payer: Self-pay | Admitting: Family Medicine

## 2017-02-26 ENCOUNTER — Other Ambulatory Visit: Payer: Self-pay | Admitting: Family

## 2017-02-26 DIAGNOSIS — E039 Hypothyroidism, unspecified: Secondary | ICD-10-CM

## 2017-02-26 DIAGNOSIS — IMO0001 Reserved for inherently not codable concepts without codable children: Secondary | ICD-10-CM

## 2017-02-26 DIAGNOSIS — Z794 Long term (current) use of insulin: Principal | ICD-10-CM

## 2017-02-26 DIAGNOSIS — I10 Essential (primary) hypertension: Secondary | ICD-10-CM

## 2017-02-26 DIAGNOSIS — E1165 Type 2 diabetes mellitus with hyperglycemia: Principal | ICD-10-CM

## 2017-02-27 NOTE — Telephone Encounter (Signed)
Authorize 30 days only. Then contact the patient letting them know that they will need an appointment before any further prescriptions can be sent in. 

## 2017-03-16 ENCOUNTER — Other Ambulatory Visit: Payer: Self-pay | Admitting: Family

## 2017-03-25 ENCOUNTER — Other Ambulatory Visit: Payer: Self-pay | Admitting: Family

## 2017-03-25 DIAGNOSIS — F419 Anxiety disorder, unspecified: Secondary | ICD-10-CM

## 2017-03-26 NOTE — Telephone Encounter (Signed)
Last seen 11/02/16  Dr Wannetta Sender PCP  IF approved route to nurse to call into [facility]

## 2017-03-26 NOTE — Telephone Encounter (Signed)
Rx called into pharmacy. Patient has appointment on 07/13

## 2017-03-26 NOTE — Telephone Encounter (Signed)
Patient NTBS for follow up and lab work  

## 2017-03-28 ENCOUNTER — Ambulatory Visit: Payer: Medicare HMO | Admitting: Family

## 2017-03-29 ENCOUNTER — Ambulatory Visit (INDEPENDENT_AMBULATORY_CARE_PROVIDER_SITE_OTHER): Payer: Medicare HMO | Admitting: Family

## 2017-03-29 ENCOUNTER — Encounter: Payer: Self-pay | Admitting: Family

## 2017-03-29 VITALS — BP 154/82 | HR 86 | Temp 97.6°F | Ht 65.0 in | Wt 277.0 lb

## 2017-03-29 DIAGNOSIS — F332 Major depressive disorder, recurrent severe without psychotic features: Secondary | ICD-10-CM | POA: Diagnosis not present

## 2017-03-29 DIAGNOSIS — M150 Primary generalized (osteo)arthritis: Secondary | ICD-10-CM | POA: Diagnosis not present

## 2017-03-29 DIAGNOSIS — J45909 Unspecified asthma, uncomplicated: Secondary | ICD-10-CM | POA: Diagnosis not present

## 2017-03-29 DIAGNOSIS — M159 Polyosteoarthritis, unspecified: Secondary | ICD-10-CM

## 2017-03-29 DIAGNOSIS — M5442 Lumbago with sciatica, left side: Secondary | ICD-10-CM

## 2017-03-29 DIAGNOSIS — F1120 Opioid dependence, uncomplicated: Secondary | ICD-10-CM

## 2017-03-29 DIAGNOSIS — E1165 Type 2 diabetes mellitus with hyperglycemia: Secondary | ICD-10-CM

## 2017-03-29 DIAGNOSIS — Z794 Long term (current) use of insulin: Secondary | ICD-10-CM | POA: Diagnosis not present

## 2017-03-29 DIAGNOSIS — M5441 Lumbago with sciatica, right side: Secondary | ICD-10-CM

## 2017-03-29 DIAGNOSIS — Z0289 Encounter for other administrative examinations: Secondary | ICD-10-CM | POA: Diagnosis not present

## 2017-03-29 DIAGNOSIS — I10 Essential (primary) hypertension: Secondary | ICD-10-CM | POA: Diagnosis not present

## 2017-03-29 DIAGNOSIS — K219 Gastro-esophageal reflux disease without esophagitis: Secondary | ICD-10-CM | POA: Diagnosis not present

## 2017-03-29 DIAGNOSIS — E039 Hypothyroidism, unspecified: Secondary | ICD-10-CM | POA: Diagnosis not present

## 2017-03-29 DIAGNOSIS — E785 Hyperlipidemia, unspecified: Secondary | ICD-10-CM | POA: Diagnosis not present

## 2017-03-29 DIAGNOSIS — IMO0001 Reserved for inherently not codable concepts without codable children: Secondary | ICD-10-CM

## 2017-03-29 DIAGNOSIS — G8929 Other chronic pain: Secondary | ICD-10-CM

## 2017-03-29 LAB — BAYER DCA HB A1C WAIVED: HB A1C (BAYER DCA - WAIVED): 7.4 % — ABNORMAL HIGH (ref <7.0)

## 2017-03-29 MED ORDER — HYDROCODONE-ACETAMINOPHEN 7.5-325 MG PO TABS: 1.0000 | ORAL_TABLET | Freq: Two times a day (BID) | ORAL | 0 refills | Status: DC | PRN

## 2017-03-29 NOTE — Patient Instructions (Signed)

## 2017-03-29 NOTE — Progress Notes (Addendum)
Subjective:    Patient ID: Mary Raymond, female    DOB: [birth year], 66 y.o.   MRN: [id number]  Pt presents to the office today for chronic follow up.  Pt is noncompliant and has not followed up with opthalmology or cardiology as instructed due to financial concerns. Diabetes  She presents for her follow-up diabetic visit. She has type 2 diabetes mellitus. Her disease course has been stable. There are no hypoglycemic associated symptoms. Associated symptoms include blurred vision, fatigue and visual change. Pertinent negatives for diabetes include no foot paresthesias and no foot ulcerations. There are no hypoglycemic complications. Symptoms are stable. Pertinent negatives for diabetic complications include no CVA, nephropathy or peripheral neuropathy. Her breakfast blood glucose range is generally 140-180 mg/dl. Eye exam is not current.  Hypertension  This is a chronic problem. The current episode started more than 1 year ago. The problem has been waxing and waning since onset. The problem is uncontrolled. Associated symptoms include blurred vision, malaise/fatigue, peripheral edema and shortness of breath ("when I walk"). Risk factors for coronary artery disease include diabetes mellitus, dyslipidemia, obesity, post-menopausal state and sedentary lifestyle. The current treatment provides mild improvement. There is no history of CVA. Identifiable causes of hypertension include a thyroid problem.  Asthma  She complains of shortness of breath ("when I walk"). There is no cough, difficulty breathing or wheezing. This is a chronic problem. The current episode started more than 1 year ago. The problem occurs intermittently. The problem has been waxing and waning. Associated symptoms include malaise/fatigue. Pertinent negatives include no heartburn. Her symptoms are alleviated by rest and beta-agonist. She reports moderate improvement on treatment. Her past medical history is significant for asthma.    Gastroesophageal Reflux  She reports no belching, no coughing, no heartburn or no wheezing. This is a chronic problem. The current episode started more than 1 year ago. The problem occurs occasionally. The problem has been rapidly improving. Associated symptoms include fatigue. Risk factors include obesity. She has tried a PPI for the symptoms. The treatment provided significant relief.  Thyroid Problem  Presents for follow-up visit. Symptoms include diarrhea, fatigue and visual change. Patient reports no constipation. The symptoms have been stable. Her past medical history is significant for hyperlipidemia.  Depression         This is a chronic problem.  The current episode started more than 1 year ago.   The onset quality is gradual.   The problem occurs intermittently.  The problem has been waxing and waning since onset.  Associated symptoms include fatigue, helplessness, hopelessness and sad.  Associated symptoms include no restlessness.  Compliance with treatment is good.  Past medical history includes thyroid problem.   Back Pain  This is a chronic problem. The current episode started more than 1 year ago. The problem occurs constantly. The problem has been waxing and waning since onset. The pain is present in the lumbar spine. The quality of the pain is described as aching. The pain is at a severity of 10/10. The pain is moderate. The symptoms are aggravated by standing. Pertinent negatives include no bladder incontinence or bowel incontinence.  Hyperlipidemia  This is a chronic problem. The current episode started more than 1 year ago. The problem is uncontrolled. Recent lipid tests were reviewed and are high. Exacerbating diseases include obesity. Associated symptoms include shortness of breath ("when I walk"). Current antihyperlipidemic treatment includes statins. The current treatment provides no improvement of lipids.      Review of Systems  Constitutional: Positive for fatigue and  malaise/fatigue.  Eyes: Positive for blurred vision.  Respiratory: Positive for shortness of breath ("when I walk"). Negative for cough and wheezing.   Gastrointestinal: Positive for diarrhea. Negative for bowel incontinence, constipation and heartburn.  Genitourinary: Negative for bladder incontinence.  Musculoskeletal: Positive for back pain.  Psychiatric/Behavioral: Positive for depression.  All other systems reviewed and are negative.      Objective:   Physical Exam  Constitutional: She is oriented to person, place, and time. She appears well-developed and well-nourished. No distress.  Morbid obese   HENT:  Head: Normocephalic and atraumatic.  Right Ear: External ear normal.  Left Ear: External ear normal.  Nose: Nose normal.  Mouth/Throat: Oropharynx is clear and moist.  Eyes: Pupils are equal, round, and reactive to light.  Neck: Normal range of motion. Neck supple. No thyromegaly present.  Cardiovascular: Normal rate, regular rhythm, normal heart sounds and intact distal pulses.   No murmur heard. Pulmonary/Chest: Effort normal and breath sounds normal. No respiratory distress. She has no wheezes.  Abdominal: Soft. Bowel sounds are normal. She exhibits no distension. There is no tenderness.  Musculoskeletal: She exhibits edema (3+ BLE) and tenderness.  Moderate Pain in lower back with walking and standing  Neurological: She is alert and oriented to person, place, and time.  Skin: Skin is warm and dry.  Psychiatric: She has a normal mood and affect. Her behavior is normal. Judgment and thought content normal.  Vitals reviewed.    BP (!) 154/82   Pulse 86   Temp 97.6 F (36.4 C) (Oral)   Ht 5' 5" (1.651 m)   Wt 277 lb (125.6 kg)   BMI 46.10 kg/m      Assessment & Plan:  1. Essential hypertension - CMP14+EGFR  2. Uncomplicated asthma, unspecified asthma severity, unspecified whether persistent - CMP14+EGFR  3. Gastroesophageal reflux disease, esophagitis  presence not specified - CMP14+EGFR  4. Hypothyroidism, unspecified type  - CMP14+EGFR - TSH  5. Uncontrolled type 2 diabetes mellitus without complication, with long-term current use of insulin (HCC) - Bayer DCA Hb A1c Waived - CMP14+EGFR - Microalbumin / creatinine urine ratio - Ambulatory referral to Ophthalmology  6. Primary osteoarthritis involving multiple joints - CMP14+EGFR - Arthritis Panel - HYDROcodone-acetaminophen (NORCO) 7.5-325 MG tablet; Take 1 tablet by mouth every 12 (twelve) hours as needed for moderate pain.  Dispense: 60 tablet; Refill: 0 - HYDROcodone-acetaminophen (NORCO) 7.5-325 MG tablet; Take 1 tablet by mouth every 12 (twelve) hours as needed for moderate pain.  Dispense: 60 tablet; Refill: 0 - HYDROcodone-acetaminophen (NORCO) 7.5-325 MG tablet; Take 1 tablet by mouth every 12 (twelve) hours as needed for moderate pain.  Dispense: 60 tablet; Refill: 0 - Ambulatory referral to Orthopedic Surgery  7. Pain medication agreement signed - CMP14+EGFR - HYDROcodone-acetaminophen (NORCO) 7.5-325 MG tablet; Take 1 tablet by mouth every 12 (twelve) hours as needed for moderate pain.  Dispense: 60 tablet; Refill: 0 - HYDROcodone-acetaminophen (NORCO) 7.5-325 MG tablet; Take 1 tablet by mouth every 12 (twelve) hours as needed for moderate pain.  Dispense: 60 tablet; Refill: 0 - HYDROcodone-acetaminophen (NORCO) 7.5-325 MG tablet; Take 1 tablet by mouth every 12 (twelve) hours as needed for moderate pain.  Dispense: 60 tablet; Refill: 0  8. Obesity, morbid, BMI 40.0-49.9 (HCC) - CMP14+EGFR - HYDROcodone-acetaminophen (NORCO) 7.5-325 MG tablet; Take 1 tablet by mouth every 12 (twelve) hours as needed for moderate pain.  Dispense: 60 tablet; Refill: 0 - HYDROcodone-acetaminophen (NORCO) 7.5-325 MG tablet; Take 1 tablet  by mouth every 12 (twelve) hours as needed for moderate pain.  Dispense: 60 tablet; Refill: 0 - HYDROcodone-acetaminophen (NORCO) 7.5-325 MG tablet; Take 1  tablet by mouth every 12 (twelve) hours as needed for moderate pain.  Dispense: 60 tablet; Refill: 0  9. 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 Chronic bilateral low back pain with bilateral sciatica - CMP14+EGFR - HYDROcodone-acetaminophen (NORCO) 7.5-325 MG tablet; Take 1 tablet by mouth every 12 (twelve) hours as needed for moderate pain.  Dispense: 60 tablet; Refill: 0 - HYDROcodone-acetaminophen (NORCO) 7.5-325 MG tablet; Take 1 tablet by mouth every 12 (twelve) hours as needed for moderate pain.  Dispense: 60 tablet; Refill: 0 - HYDROcodone-acetaminophen (NORCO) 7.5-325 MG tablet; Take 1 tablet by mouth every 12 (twelve) hours as needed for moderate pain.  Dispense: 60 tablet; Refill: 0 - Ambulatory referral to Orthopedic Surgery  12. Uncomplicated opioid dependence ([place])  - HYDROcodone-acetaminophen (NORCO) 7.5-325 MG tablet; Take 1 tablet by mouth every 12 (twelve) hours as needed for moderate pain.  Dispense: 60 tablet; Refill: 0 - HYDROcodone-acetaminophen (NORCO) 7.5-325 MG tablet; Take 1 tablet by mouth every 12 (twelve) hours as needed for moderate pain.  Dispense: 60 tablet; Refill: 0 - HYDROcodone-acetaminophen (NORCO) 7.5-325 MG tablet; Take 1 tablet by mouth every 12 (twelve) hours as needed for moderate pain.  Dispense: 60 tablet; Refill: 0  Pt reviewed in [place] controlled database- Pt only received controlled medications from me.    Continue all meds Labs pending Health Maintenance reviewed Diet and exercise encouraged RTO 3 months   Evelina Dun, FNP

## 2017-03-30 LAB — LIPID PANEL
CHOLESTEROL TOTAL: 269 mg/dL — AB (ref 100–199)
Chol/HDL Ratio: 6.3 ratio — ABNORMAL HIGH (ref 0.0–4.4)
HDL: 43 mg/dL (ref >39)
LDL Calculated: 155 mg/dL — ABNORMAL HIGH (ref 0–99)
Triglycerides: 356 mg/dL — ABNORMAL HIGH (ref 0–149)
VLDL Cholesterol Cal: 71 mg/dL — ABNORMAL HIGH (ref 5–40)

## 2017-03-30 LAB — ARTHRITIS PANEL
BASOS: 0 %
Basophils Absolute: 0 10*3/uL (ref 0.0–0.2)
EOS (ABSOLUTE): 0.2 10*3/uL (ref 0.0–0.4)
Eos: 2 %
HEMOGLOBIN: 10.6 g/dL — AB (ref 11.1–15.9)
Hematocrit: 33.7 % — ABNORMAL LOW (ref 34.0–46.6)
IMMATURE GRANS (ABS): 0.1 10*3/uL (ref 0.0–0.1)
Immature Granulocytes: 1 %
Lymphocytes Absolute: 2.3 10*3/uL (ref 0.7–3.1)
Lymphs: 23 %
MCH: 24 pg — ABNORMAL LOW (ref 26.6–33.0)
MCHC: 31.5 g/dL (ref 31.5–35.7)
MCV: 76 fL — ABNORMAL LOW (ref 79–97)
MONOCYTES: 6 %
Monocytes Absolute: 0.5 10*3/uL (ref 0.1–0.9)
NEUTROS ABS: 6.7 10*3/uL (ref 1.4–7.0)
Neutrophils: 68 %
Platelets: 271 10*3/uL (ref 150–379)
RBC: 4.42 x10E6/uL (ref 3.77–5.28)
RDW: 16.8 % — ABNORMAL HIGH (ref 12.3–15.4)
Rhuematoid fact SerPl-aCnc: <10 IU/mL (ref 0.0–13.9)
SED RATE: 38 mm/h (ref 0–40)
Uric Acid: 8 mg/dL — ABNORMAL HIGH (ref 2.5–7.1)
WBC: 9.9 10*3/uL (ref 3.4–10.8)

## 2017-03-30 LAB — CMP14+EGFR
A/G RATIO: 1.4 (ref 1.2–2.2)
ALBUMIN: 3.6 g/dL (ref 3.6–4.8)
ALK PHOS: 97 IU/L (ref 39–117)
ALT: 10 IU/L (ref 0–32)
AST: 12 IU/L (ref 0–40)
BUN / CREAT RATIO: 18 (ref 12–28)
BUN: 13 mg/dL (ref 8–27)
Bilirubin Total: 0.3 mg/dL (ref 0.0–1.2)
CO2: 25 mmol/L (ref 20–29)
CREATININE: 0.73 mg/dL (ref 0.57–1.00)
Calcium: 9.6 mg/dL (ref 8.7–10.3)
Chloride: 102 mmol/L (ref 96–106)
GFR calc Af Amer: 100 mL/min/{1.73_m2} (ref >59)
GFR, EST NON AFRICAN AMERICAN: 87 mL/min/{1.73_m2} (ref >59)
GLOBULIN, TOTAL: 2.6 g/dL (ref 1.5–4.5)
Glucose: 191 mg/dL — ABNORMAL HIGH (ref 65–99)
Potassium: 4.7 mmol/L (ref 3.5–5.2)
SODIUM: 143 mmol/L (ref 134–144)
Total Protein: 6.2 g/dL (ref 6.0–8.5)

## 2017-03-30 LAB — MICROALBUMIN / CREATININE URINE RATIO
Creatinine, Urine: 79 mg/dL
MICROALB/CREAT RATIO: 5402.5 mg/g{creat} — AB (ref 0.0–30.0)
Microalbumin, Urine: 4268 ug/mL

## 2017-03-30 LAB — TSH: TSH: 2.17 u[IU]/mL (ref 0.450–4.500)

## 2017-04-01 ENCOUNTER — Other Ambulatory Visit: Payer: Self-pay | Admitting: Family

## 2017-04-01 DIAGNOSIS — Z794 Long term (current) use of insulin: Principal | ICD-10-CM

## 2017-04-01 DIAGNOSIS — E1165 Type 2 diabetes mellitus with hyperglycemia: Principal | ICD-10-CM

## 2017-04-01 DIAGNOSIS — IMO0001 Reserved for inherently not codable concepts without codable children: Secondary | ICD-10-CM

## 2017-04-03 ENCOUNTER — Other Ambulatory Visit: Payer: Self-pay | Admitting: Family

## 2017-04-04 ENCOUNTER — Other Ambulatory Visit: Payer: Self-pay | Admitting: Family Medicine

## 2017-04-04 ENCOUNTER — Other Ambulatory Visit: Payer: Self-pay | Admitting: Family

## 2017-04-04 DIAGNOSIS — E785 Hyperlipidemia, unspecified: Secondary | ICD-10-CM

## 2017-04-23 ENCOUNTER — Other Ambulatory Visit: Payer: Self-pay | Admitting: Family

## 2017-04-23 DIAGNOSIS — Z794 Long term (current) use of insulin: Principal | ICD-10-CM

## 2017-04-23 DIAGNOSIS — E1165 Type 2 diabetes mellitus with hyperglycemia: Principal | ICD-10-CM

## 2017-04-23 DIAGNOSIS — IMO0001 Reserved for inherently not codable concepts without codable children: Secondary | ICD-10-CM

## 2017-04-30 ENCOUNTER — Other Ambulatory Visit: Payer: Self-pay | Admitting: Family

## 2017-04-30 DIAGNOSIS — E1165 Type 2 diabetes mellitus with hyperglycemia: Principal | ICD-10-CM

## 2017-04-30 DIAGNOSIS — Z794 Long term (current) use of insulin: Secondary | ICD-10-CM

## 2017-04-30 DIAGNOSIS — IMO0001 Reserved for inherently not codable concepts without codable children: Secondary | ICD-10-CM

## 2017-05-02 ENCOUNTER — Other Ambulatory Visit: Payer: Self-pay | Admitting: Family Medicine

## 2017-05-02 DIAGNOSIS — I10 Essential (primary) hypertension: Secondary | ICD-10-CM

## 2017-05-08 ENCOUNTER — Other Ambulatory Visit: Payer: Self-pay | Admitting: Family Medicine

## 2017-05-08 DIAGNOSIS — E039 Hypothyroidism, unspecified: Secondary | ICD-10-CM

## 2017-05-21 ENCOUNTER — Other Ambulatory Visit: Payer: Self-pay | Admitting: Family

## 2017-05-21 DIAGNOSIS — F419 Anxiety disorder, unspecified: Secondary | ICD-10-CM

## 2017-05-22 NOTE — Telephone Encounter (Signed)
Xanax refill called to [facility] voice mail.

## 2017-06-18 ENCOUNTER — Telehealth: Payer: Self-pay | Admitting: Family

## 2017-06-18 MED ORDER — SULFAMETHOXAZOLE-TRIMETHOPRIM 800-160 MG PO TABS: 1.0000 | ORAL_TABLET | Freq: Two times a day (BID) | ORAL | 0 refills | Status: DC

## 2017-06-18 NOTE — Telephone Encounter (Signed)
Bactrim Prescription sent to pharmacy

## 2017-06-18 NOTE — Telephone Encounter (Signed)
Pt c/o dysuria urgency and frequency x 1 day. Denies blood in Urine. Pt does not have a car to come in wants abx called into [place]. NKDA.

## 2017-06-18 NOTE — Telephone Encounter (Signed)
Patient aware.

## 2017-06-20 ENCOUNTER — Other Ambulatory Visit: Payer: Self-pay | Admitting: Family Medicine

## 2017-06-20 MED ORDER — PHENAZOPYRIDINE HCL 95 MG PO TABS: 95.0000 mg | ORAL_TABLET | Freq: Three times a day (TID) | ORAL | 0 refills | Status: DC | PRN

## 2017-06-20 MED ORDER — NITROFURANTOIN MONOHYD MACRO 100 MG PO CAPS: 100.0000 mg | ORAL_CAPSULE | Freq: Two times a day (BID) | ORAL | 0 refills | Status: DC

## 2017-06-20 NOTE — Progress Notes (Signed)
Sent macrobid for her

## 2017-06-27 ENCOUNTER — Other Ambulatory Visit: Payer: Self-pay | Admitting: Family

## 2017-07-05 ENCOUNTER — Other Ambulatory Visit: Payer: Self-pay | Admitting: Family

## 2017-07-08 ENCOUNTER — Other Ambulatory Visit: Payer: Self-pay | Admitting: Family

## 2017-07-08 DIAGNOSIS — IMO0001 Reserved for inherently not codable concepts without codable children: Secondary | ICD-10-CM

## 2017-07-08 DIAGNOSIS — I10 Essential (primary) hypertension: Secondary | ICD-10-CM

## 2017-07-08 DIAGNOSIS — E1165 Type 2 diabetes mellitus with hyperglycemia: Principal | ICD-10-CM

## 2017-07-08 DIAGNOSIS — Z794 Long term (current) use of insulin: Secondary | ICD-10-CM

## 2017-09-11 ENCOUNTER — Other Ambulatory Visit: Payer: Self-pay | Admitting: Family

## 2017-09-11 NOTE — Telephone Encounter (Signed)
Please make sure that patient has f/u w/ PCP.  Last A1c not a goal. Should be following up q3 months until A1c at goal.  Insulin refilled x1 month.

## 2017-09-11 NOTE — Telephone Encounter (Signed)
Last seen 03/29/17  [facility]

## 2017-09-11 NOTE — Telephone Encounter (Signed)
Patient aware.

## 2017-09-12 ENCOUNTER — Encounter (HOSPITAL_COMMUNITY): Payer: Self-pay

## 2017-09-12 ENCOUNTER — Other Ambulatory Visit: Payer: Self-pay

## 2017-09-12 ENCOUNTER — Emergency Department (HOSPITAL_COMMUNITY)
Admission: EM | Admit: 2017-09-12 | Discharge: 2017-09-12 | Disposition: A | Discharge status: Home / Self Care | Payer: Medicare HMO | Attending: Emergency Medicine | Admitting: Emergency Medicine

## 2017-09-12 ENCOUNTER — Emergency Department (HOSPITAL_COMMUNITY): Payer: Medicare HMO

## 2017-09-12 DIAGNOSIS — Z0289 Encounter for other administrative examinations: Secondary | ICD-10-CM

## 2017-09-12 DIAGNOSIS — E039 Hypothyroidism, unspecified: Secondary | ICD-10-CM | POA: Insufficient documentation

## 2017-09-12 DIAGNOSIS — G8929 Other chronic pain: Secondary | ICD-10-CM

## 2017-09-12 DIAGNOSIS — M150 Primary generalized (osteo)arthritis: Secondary | ICD-10-CM

## 2017-09-12 DIAGNOSIS — M545 Low back pain: Secondary | ICD-10-CM | POA: Diagnosis not present

## 2017-09-12 DIAGNOSIS — N12 Tubulo-interstitial nephritis, not specified as acute or chronic: Secondary | ICD-10-CM

## 2017-09-12 DIAGNOSIS — Z79899 Other long term (current) drug therapy: Secondary | ICD-10-CM | POA: Diagnosis not present

## 2017-09-12 DIAGNOSIS — M25551 Pain in right hip: Secondary | ICD-10-CM | POA: Diagnosis not present

## 2017-09-12 DIAGNOSIS — S3993XA Unspecified injury of pelvis, initial encounter: Secondary | ICD-10-CM | POA: Diagnosis not present

## 2017-09-12 DIAGNOSIS — N10 Acute tubulo-interstitial nephritis: Secondary | ICD-10-CM | POA: Insufficient documentation

## 2017-09-12 DIAGNOSIS — M5441 Lumbago with sciatica, right side: Secondary | ICD-10-CM

## 2017-09-12 DIAGNOSIS — F1120 Opioid dependence, uncomplicated: Secondary | ICD-10-CM

## 2017-09-12 DIAGNOSIS — R51 Headache: Secondary | ICD-10-CM | POA: Insufficient documentation

## 2017-09-12 DIAGNOSIS — I10 Essential (primary) hypertension: Secondary | ICD-10-CM | POA: Diagnosis not present

## 2017-09-12 DIAGNOSIS — J45909 Unspecified asthma, uncomplicated: Secondary | ICD-10-CM | POA: Diagnosis not present

## 2017-09-12 DIAGNOSIS — M5442 Lumbago with sciatica, left side: Secondary | ICD-10-CM

## 2017-09-12 DIAGNOSIS — M159 Polyosteoarthritis, unspecified: Secondary | ICD-10-CM

## 2017-09-12 DIAGNOSIS — E119 Type 2 diabetes mellitus without complications: Secondary | ICD-10-CM | POA: Insufficient documentation

## 2017-09-12 DIAGNOSIS — M549 Dorsalgia, unspecified: Secondary | ICD-10-CM | POA: Diagnosis present

## 2017-09-12 LAB — CBC WITH DIFFERENTIAL/PLATELET
BASOS PCT: 0 %
Basophils Absolute: 0 10*3/uL (ref 0.0–0.1)
Eosinophils Absolute: 0.2 10*3/uL (ref 0.0–0.7)
Eosinophils Relative: 1 %
HEMATOCRIT: 35.1 % — AB (ref 36.0–46.0)
HEMOGLOBIN: 10.4 g/dL — AB (ref 12.0–15.0)
LYMPHS ABS: 2.4 10*3/uL (ref 0.7–4.0)
LYMPHS PCT: 19 %
MCH: 24 pg — AB (ref 26.0–34.0)
MCHC: 29.6 g/dL — AB (ref 30.0–36.0)
MCV: 81.1 fL (ref 78.0–100.0)
MONO ABS: 1 10*3/uL (ref 0.1–1.0)
MONOS PCT: 8 %
NEUTROS ABS: 9.3 10*3/uL — AB (ref 1.7–7.7)
NEUTROS PCT: 72 %
Platelets: 248 10*3/uL (ref 150–400)
RBC: 4.33 MIL/uL (ref 3.87–5.11)
RDW: 15.9 % — AB (ref 11.5–15.5)
WBC: 12.9 10*3/uL — ABNORMAL HIGH (ref 4.0–10.5)

## 2017-09-12 LAB — BASIC METABOLIC PANEL
Anion gap: 10 (ref 5–15)
BUN: 22 mg/dL — ABNORMAL HIGH (ref 6–20)
CALCIUM: 8.7 mg/dL — AB (ref 8.9–10.3)
CHLORIDE: 106 mmol/L (ref 101–111)
CO2: 23 mmol/L (ref 22–32)
CREATININE: 0.85 mg/dL (ref 0.44–1.00)
GFR calc non Af Amer: >60 mL/min (ref >60)
GLUCOSE: 150 mg/dL — AB (ref 65–99)
Potassium: 4.2 mmol/L (ref 3.5–5.1)
Sodium: 139 mmol/L (ref 135–145)

## 2017-09-12 LAB — URINALYSIS, ROUTINE W REFLEX MICROSCOPIC
BILIRUBIN URINE: NEGATIVE
GLUCOSE, UA: NEGATIVE mg/dL
KETONES UR: NEGATIVE mg/dL
NITRITE: NEGATIVE
PH: 5 (ref 5.0–8.0)
Specific Gravity, Urine: 1.017 (ref 1.005–1.030)

## 2017-09-12 MED ORDER — HYDROMORPHONE HCL 1 MG/ML IJ SOLN
1.0000 mg | Freq: Once | INTRAMUSCULAR | Status: AC
  Administered 2017-09-12: 1 mg via INTRAVENOUS
  Filled 2017-09-12: qty 1

## 2017-09-12 MED ORDER — CEPHALEXIN 500 MG PO CAPS: 500.0000 mg | ORAL_CAPSULE | Freq: Four times a day (QID) | ORAL | 0 refills | Status: DC

## 2017-09-12 MED ORDER — OXYCODONE-ACETAMINOPHEN 5-325 MG PO TABS: 1.0000 | ORAL_TABLET | Freq: Three times a day (TID) | ORAL | 0 refills | Status: DC | PRN

## 2017-09-12 MED ORDER — ONDANSETRON HCL 4 MG PO TABS: 4.0000 mg | ORAL_TABLET | Freq: Three times a day (TID) | ORAL | 0 refills | Status: DC | PRN

## 2017-09-12 MED ORDER — CEFTRIAXONE SODIUM 1 G IJ SOLR
1.0000 g | Freq: Once | INTRAMUSCULAR | Status: AC
  Administered 2017-09-12: 1 g via INTRAVENOUS
  Filled 2017-09-12: qty 10

## 2017-09-12 NOTE — ED Triage Notes (Signed)
Patient reports of falling off bed about 3 weeks ago. Complains of lower back pain that radiates into right hip x2 days. HX of chronic back pain.

## 2017-09-12 NOTE — Discharge Instructions (Signed)
Please follow-up with your primary doctor to get a recheck of your urine in a week or sooner if not improving.

## 2017-09-12 NOTE — ED Provider Notes (Signed)
Emergency Department Provider Note   I have reviewed the triage vital signs and the nursing notes.   HISTORY  Chief Complaint Back Pain   HPI Mary Raymond is a 66 y.o. female history of chronic back pain and recurrent UTIs, obesity, hypertension, hyperlipidemia and diabetes presents to the emergency department today secondary to worsening right-sided pain.  Patient states she had kidney infections before and it feels something like that.  She does not have any history of kidney stones however.  She states she did fall a month ago but it seemed that the pain got a lot worse in the last 3-4 days.  She describes as a sharp pain in her right lower back that shoots down to her right gluteal area.  Has had increased urinary frequency and dysuria but not severe like it is with previous urinary tract infections.  Does not have urgency or hesitancy.  No abdominal symptoms that are new.  She has chronic diarrhea, cough, back pain, chest pain and leg pain.  She has no acute exacerbations of those besides the back pain.  States she felt like she had a fever last night but did not measure it.  States she has nausea often but this is not new.  No vomiting.  No other associated or modifying symptoms.  Has not tried anything for symptoms.  Past Medical History:  Diagnosis Date  . Allergy   . Anxiety   . Asthma   . Diabetes mellitus without complication ([place])   . Gout   . Hyperlipidemia   . Hypertension   . Hypothyroid   . Neuropathy   . Obesity   . Vitamin D deficiency     Patient Active Problem List   Diagnosis Date Noted  . Genital herpes 08/02/2016  . Pain medication agreement signed 05/01/2016  . Uncomplicated opioid dependence ([place]) 05/01/2016  . Chronic back pain 08/10/2015  . 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 Depression 11/15/2014  . GERD (gastroesophageal reflux disease) 11/15/2014  . Hypertension   . Hyperlipidemia   . Diabetes mellitus type 2,  uncontrolled, without complications ([place])   . Anxiety   . Obesity, morbid, BMI 40.0-49.9 ([place])   . Allergy   . Asthma   . Vitamin D deficiency   . Neuropathy   . Hypothyroidism 02/25/2013    Past Surgical History:  Procedure Laterality Date  . ABDOMINAL HYSTERECTOMY    . BREAST LUMPECTOMY    . rectal fissure      Current Outpatient Rx  . Order #: [id number] Class: Normal  . Order #: [id number] Class: Historical Med  . Order #: [id number] Class: Phone In  . Order #: [id number] Class: Normal  . Order #: [id number] Class: Historical Med  . Order #: [id number] Class: Normal  . Order #: [id number] Class: Print  . Order #: [id number] Class: Historical Med  . Order #: [id number] Class: Historical Med  . Order #: [id number] Class: Normal  . Order #: [id number] Class: Normal  . Order #: [id number] Class: Normal  . Order #: [id number] Class: Normal  . Order #: [id number] Class: Normal  . Order #: [id number] Class: Normal  . Order #: [id number] Class: Normal  . Order #: [id number] Class: Normal  . Order #: [id number] Class: Print  . Order #: [id number] Class: Print  . Order #: [id number] Class: Print    Allergies Atorvastatin  Family History  Problem Relation Age of Onset  . Cancer Mother        colon  . Diabetes Mother   . Hypertension Mother   . Dementia Mother   .  Heart disease Father   . Asthma Sister   . Cancer Sister        breast  . Asthma Sister   . Scoliosis Sister   . Diabetes Sister   . Asthma Sister     Social History Social History   Tobacco Use  . Smoking status: Never Smoker  . Smokeless tobacco: Never Used  Substance Use Topics  . Alcohol use: No  . Drug use: No    Review of Systems  All other systems negative except as documented in the HPI. All pertinent positives and negatives as reviewed in the HPI. ____________________________________________   PHYSICAL EXAM:  VITAL SIGNS: ED Triage Vitals  Enc Vitals Group     BP 09/12/17 1010 (!) 120/49     Pulse Rate 09/12/17  1010 85     Resp 09/12/17 1010 18     Temp 09/12/17 1010 97.9 F (36.6 C)     Temp Source 09/12/17 1010 Oral     SpO2 09/12/17 1010 97 %     Weight 09/12/17 1010 265 lb (120.2 kg)     Height 09/12/17 1010 5\' 5"  (1.651 m)    Constitutional: Alert and oriented. Well appearing and in no acute distress. Eyes: Conjunctivae are normal. PERRL. EOMI. Head: Atraumatic. Nose: No congestion/rhinnorhea. Mouth/Throat: Mucous membranes are moist.  Oropharynx non-erythematous. Neck: No stridor.  No meningeal signs.   Cardiovascular: Normal rate, regular rhythm. Good peripheral circulation. Grossly normal heart sounds.   Respiratory: Normal respiratory effort.  No retractions. Lungs CTAB. Gastrointestinal: Soft and nontender. No distention.  Musculoskeletal: No lower extremity tenderness nor edema. No gross deformities of extremities. Neurologic:  Normal speech and language. No gross focal neurologic deficits are appreciated.  Skin:  Skin is warm, dry and intact. No rash noted.   ____________________________________________   LABS (all labs ordered are listed, but only abnormal results are displayed)  Labs Reviewed  CBC WITH DIFFERENTIAL/PLATELET - Abnormal; Notable for the following components:      Result Value   WBC 12.9 (*)    Hemoglobin 10.4 (*)    HCT 35.1 (*)    MCH 24.0 (*)    MCHC 29.6 (*)    RDW 15.9 (*)    Neutro Abs 9.3 (*)    All other components within normal limits  BASIC METABOLIC PANEL - Abnormal; Notable for the following components:   Glucose, Bld 150 (*)    BUN 22 (*)    Calcium 8.7 (*)    All other components within normal limits  URINALYSIS, ROUTINE W REFLEX MICROSCOPIC - Abnormal; Notable for the following components:   APPearance CLOUDY (*)    Hgb urine dipstick SMALL (*)    Protein, ur >=300 (*)    Leukocytes, UA TRACE (*)    Bacteria, UA MANY (*)    Squamous Epithelial / LPF 6-30 (*)    All other components within normal limits  URINE CULTURE    ____________________________________________  RADIOLOGY  Dg Lumbar Spine Complete  Result Date: 09/12/2017 CLINICAL DATA:  Fall 3 weeks ago with persistent low back pain radiating into the right hip, initial encounter EXAM: LUMBAR SPINE - COMPLETE 4+ VIEW COMPARISON:  None. FINDINGS: Five lumbar type vertebral bodies are well visualized. Vertebral body height is well maintained. Mild osteophytic changes are seen. No pars defects noted. Diffuse aortic calcifications are noted. IMPRESSION: No acute abnormality noted. Electronically Signed   By: Inez Catalina M.D.   On: 09/12/2017 12:10   Dg Pelvis 1-2 Views  Result Date: 09/12/2017 CLINICAL DATA:  Recent fall with right hip pain, initial encounter EXAM: PELVIS - 1-2 VIEW COMPARISON:  04/20/2016 CT of the abdomen and pelvis. FINDINGS: No acute fracture or dislocation is noted. Mild degenerative changes of the hip joints are noted. Mild degenerative changes of the lumbar spine are seen. IMPRESSION: Degenerative change without acute abnormality. Electronically Signed   By: Inez Catalina M.D.   On: 09/12/2017 12:22   Ct Head Wo Contrast  Result Date: 09/12/2017 CLINICAL DATA:  Fall several weeks ago with persistent headaches, initial encounter EXAM: CT HEAD WITHOUT CONTRAST TECHNIQUE: Contiguous axial images were obtained from the base of the skull through the vertex without intravenous contrast. COMPARISON:  None. FINDINGS: Brain: No evidence of acute infarction, hemorrhage, hydrocephalus, extra-axial collection or mass lesion/mass effect. Vascular: No hyperdense vessel or unexpected calcification. Skull: Normal. Negative for fracture or focal lesion. Sinuses/Orbits: No acute finding. Other: None. IMPRESSION: No acute intracranial abnormality noted. Electronically Signed   By: Inez Catalina M.D.   On: 09/12/2017 12:47    ____________________________________________   PROCEDURES  Procedure(s) performed:    Procedures   ____________________________________________   INITIAL IMPRESSION / ASSESSMENT AND PLAN / ED COURSE  Possibly traumatic pain? Will xr. Possibly UTI/pyelo? Will check labs/UA. Pain meds in mean time.   UTI and likely PYelo. Revealed that she had been havign weakness, confusion and headaches worse over last few weeks as well.  CT scan without any evidence of intracranial abnormality warrant further workup.  Doubt cancer or head bleed as the cause.  I think her symptoms are more likely related to dehydration and urinary tract infection and possibly early pyelonephritis.  Will discharge to follow-up with primary doctor in a week to ensure resolution of her urinary tract infection otherwise can return here for any worsening symptoms.   Pertinent labs & imaging results that were available during my care of the patient were reviewed by me and considered in my medical decision making (see chart for details).  ____________________________________________  FINAL CLINICAL IMPRESSION(S) / ED DIAGNOSES  Final diagnoses:  Pyelonephritis     MEDICATIONS GIVEN DURING THIS VISIT:  Medications  HYDROmorphone (DILAUDID) injection 1 mg (1 mg Intravenous Given 09/12/17 1138)  cefTRIAXone (ROCEPHIN) 1 g in dextrose 5 % 50 mL IVPB (1 g Intravenous New Bag/Given 09/12/17 1247)     NEW OUTPATIENT MEDICATIONS STARTED DURING THIS VISIT:  This SmartLink is deprecated. Use AVSMEDLIST instead to display the medication list for a patient.  Note:  This note was prepared with assistance of Dragon voice recognition software. Occasional wrong-word or sound-a-like substitutions may have occurred due to the inherent limitations of voice recognition software.   Merrily Pew, MD 09/12/17 1335

## 2017-09-12 NOTE — ED Notes (Signed)
Pt returned from xray

## 2017-09-15 LAB — URINE CULTURE

## 2017-09-16 ENCOUNTER — Telehealth: Payer: Self-pay | Admitting: Emergency Medicine

## 2017-09-16 ENCOUNTER — Other Ambulatory Visit: Payer: Self-pay | Admitting: Family

## 2017-09-16 DIAGNOSIS — Z794 Long term (current) use of insulin: Secondary | ICD-10-CM

## 2017-09-16 DIAGNOSIS — E1165 Type 2 diabetes mellitus with hyperglycemia: Secondary | ICD-10-CM

## 2017-09-16 DIAGNOSIS — IMO0001 Reserved for inherently not codable concepts without codable children: Secondary | ICD-10-CM

## 2017-09-16 DIAGNOSIS — I10 Essential (primary) hypertension: Secondary | ICD-10-CM

## 2017-09-16 NOTE — Telephone Encounter (Signed)
Post ED Visit - Positive Culture Follow-up  Culture report reviewed by antimicrobial stewardship pharmacist:  []  Elenor Quinones, Pharm.D. []  Heide Guile, Pharm.D., BCPS AQ-ID [x]  Parks Neptune, Pharm.D., BCPS []  Alycia Rossetti, Pharm.D., BCPS []  [place], Pharm.D., BCPS, AAHIVP []  Legrand Como, Pharm.D., BCPS, AAHIVP []  Salome Arnt, PharmD, BCPS []  Dimitri Ped, PharmD, BCPS []  Vincenza Hews, PharmD, BCPS  Positive urine culture Treated with cephalexin, organism sensitive to the same and no further patient follow-up is required at this time.  Hazle Nordmann 09/16/2017, 1:21 PM

## 2017-11-05 ENCOUNTER — Encounter: Payer: Self-pay | Admitting: Family

## 2017-11-05 ENCOUNTER — Ambulatory Visit (INDEPENDENT_AMBULATORY_CARE_PROVIDER_SITE_OTHER): Payer: Medicare HMO | Admitting: Family

## 2017-11-05 ENCOUNTER — Other Ambulatory Visit: Payer: Self-pay | Admitting: *Deleted

## 2017-11-05 VITALS — BP 176/82 | HR 88 | Temp 97.8°F | Ht 65.0 in | Wt 275.0 lb

## 2017-11-05 DIAGNOSIS — Z91199 Patient's noncompliance with other medical treatment and regimen due to unspecified reason: Secondary | ICD-10-CM | POA: Insufficient documentation

## 2017-11-05 DIAGNOSIS — I10 Essential (primary) hypertension: Secondary | ICD-10-CM

## 2017-11-05 DIAGNOSIS — Z0289 Encounter for other administrative examinations: Secondary | ICD-10-CM | POA: Diagnosis not present

## 2017-11-05 DIAGNOSIS — J45909 Unspecified asthma, uncomplicated: Secondary | ICD-10-CM | POA: Diagnosis not present

## 2017-11-05 DIAGNOSIS — R350 Frequency of micturition: Secondary | ICD-10-CM | POA: Diagnosis not present

## 2017-11-05 DIAGNOSIS — F1120 Opioid dependence, uncomplicated: Secondary | ICD-10-CM

## 2017-11-05 DIAGNOSIS — Z23 Encounter for immunization: Secondary | ICD-10-CM | POA: Diagnosis not present

## 2017-11-05 DIAGNOSIS — R609 Edema, unspecified: Secondary | ICD-10-CM

## 2017-11-05 DIAGNOSIS — E785 Hyperlipidemia, unspecified: Secondary | ICD-10-CM

## 2017-11-05 DIAGNOSIS — I152 Hypertension secondary to endocrine disorders: Secondary | ICD-10-CM

## 2017-11-05 DIAGNOSIS — E1159 Type 2 diabetes mellitus with other circulatory complications: Secondary | ICD-10-CM

## 2017-11-05 DIAGNOSIS — G8929 Other chronic pain: Secondary | ICD-10-CM

## 2017-11-05 DIAGNOSIS — M5441 Lumbago with sciatica, right side: Secondary | ICD-10-CM | POA: Diagnosis not present

## 2017-11-05 DIAGNOSIS — E1169 Type 2 diabetes mellitus with other specified complication: Secondary | ICD-10-CM

## 2017-11-05 DIAGNOSIS — Z9119 Patient's noncompliance with other medical treatment and regimen: Secondary | ICD-10-CM

## 2017-11-05 DIAGNOSIS — M5442 Lumbago with sciatica, left side: Secondary | ICD-10-CM

## 2017-11-05 DIAGNOSIS — E1165 Type 2 diabetes mellitus with hyperglycemia: Secondary | ICD-10-CM | POA: Diagnosis not present

## 2017-11-05 DIAGNOSIS — E039 Hypothyroidism, unspecified: Secondary | ICD-10-CM

## 2017-11-05 DIAGNOSIS — I509 Heart failure, unspecified: Secondary | ICD-10-CM | POA: Diagnosis not present

## 2017-11-05 DIAGNOSIS — F419 Anxiety disorder, unspecified: Secondary | ICD-10-CM

## 2017-11-05 DIAGNOSIS — IMO0001 Reserved for inherently not codable concepts without codable children: Secondary | ICD-10-CM

## 2017-11-05 LAB — URINALYSIS, COMPLETE
BILIRUBIN UA: NEGATIVE
KETONES UA: NEGATIVE
NITRITE UA: NEGATIVE
SPEC GRAV UA: 1.025 (ref 1.005–1.030)
Urobilinogen, Ur: 0.2 mg/dL (ref 0.2–1.0)
pH, UA: 6 (ref 5.0–7.5)

## 2017-11-05 LAB — MICROSCOPIC EXAMINATION
Renal Epithel, UA: NONE SEEN /hpf
WBC, UA: >30 /hpf — AB (ref 0–?)

## 2017-11-05 MED ORDER — ALPRAZOLAM 0.5 MG PO TABS: ORAL_TABLET | ORAL | 2 refills | Status: DC

## 2017-11-05 MED ORDER — HYDROCODONE-ACETAMINOPHEN 7.5-325 MG PO TABS: 1.0000 | ORAL_TABLET | Freq: Every day | ORAL | 0 refills | Status: DC

## 2017-11-05 MED ORDER — FUROSEMIDE 20 MG PO TABS: 20.0000 mg | ORAL_TABLET | Freq: Every day | ORAL | 3 refills | Status: DC

## 2017-11-05 MED ORDER — ACYCLOVIR 400 MG PO TABS: 400.0000 mg | ORAL_TABLET | Freq: Every day | ORAL | 0 refills | Status: DC

## 2017-11-05 MED ORDER — MONTELUKAST SODIUM 10 MG PO TABS: 10.0000 mg | ORAL_TABLET | Freq: Every day | ORAL | 1 refills | Status: DC

## 2017-11-05 MED ORDER — FLUTICASONE PROPIONATE 50 MCG/ACT NA SUSP: 2.0000 | Freq: Every day | NASAL | 1 refills | Status: DC

## 2017-11-05 NOTE — Addendum Note (Signed)
Addended by: Evelina Dun A on: 11/05/2017 03:26 PM   Modules accepted: Orders

## 2017-11-05 NOTE — Patient Instructions (Signed)
Heart Failure Heart failure is a condition in which the heart has trouble pumping blood because it has become weak or stiff. This means that the heart does not pump blood efficiently for the body to work well. For some people with heart failure, fluid may back up into the lungs and there may be swelling (edema) in the lower legs. Heart failure is usually a long-term (chronic) condition. It is important for you to take good care of yourself and follow the treatment plan from your health care provider. What are the causes? This condition is caused by some health problems, including:  High blood pressure (hypertension). Hypertension causes the heart muscle to work harder than normal. High blood pressure eventually causes the heart to become stiff and weak.  Coronary artery disease (CAD). CAD is the buildup of cholesterol and fat (plaques) in the arteries of the heart.  Heart attack (myocardial infarction). Injured tissue, which is caused by the heart attack, does not contract as well and the heart's ability to pump blood is weakened.  Abnormal heart valves. When the heart valves do not open and close properly, the heart muscle must pump harder to keep the blood flowing.  Heart muscle disease (cardiomyopathy or myocarditis). Heart muscle disease is damage to the heart muscle from a variety of causes, such as drug or alcohol abuse, infections, or unknown causes. These can increase the risk of heart failure.  Lung disease. When the lungs do not work properly, the heart must work harder.  What increases the risk? Risk of heart failure increases as a person ages. This condition is also more likely to develop in people who:  Are overweight.  Are female.  Smoke or chew tobacco.  Abuse alcohol or illegal drugs.  Have taken medicines that can damage the heart, such as chemotherapy drugs.  Have diabetes. ? High blood sugar (glucose) is associated with high fat (lipid) levels in the blood. ? Diabetes  can also damage tiny blood vessels that carry nutrients to the heart muscle.  Have abnormal heart rhythms.  Have thyroid problems.  Have low blood counts (anemia).  What are the signs or symptoms? Symptoms of this condition include:  Shortness of breath with activity, such as when climbing stairs.  Persistent cough.  Swelling of the feet, ankles, legs, or abdomen.  Unexplained weight gain.  Difficulty breathing when lying flat (orthopnea).  Waking from sleep because of the need to sit up and get more air.  Rapid heartbeat.  Fatigue and loss of energy.  Feeling light-headed, dizzy, or close to fainting.  Loss of appetite.  Nausea.  Increased urination during the night (nocturia).  Confusion.  How is this diagnosed? This condition is diagnosed based on:  Medical history, symptoms, and a physical exam.  Diagnostic tests, which may include: ? Echocardiogram. ? Electrocardiogram (ECG). ? Chest X-ray. ? Blood tests. ? Exercise stress test. ? Radionuclide scans. ? Cardiac catheterization and angiogram.  How is this treated? Treatment for this condition is aimed at managing the symptoms of heart failure. Medicines, behavioral changes, or other treatments may be necessary to treat heart failure. Medicines These may include:  Angiotensin-converting enzyme (ACE) inhibitors. This type of medicine blocks the effects of a blood protein called angiotensin-converting enzyme. ACE inhibitors relax (dilate) the blood vessels and help to lower blood pressure.  Angiotensin receptor blockers (ARBs). This type of medicine blocks the actions of a blood protein called angiotensin. ARBs dilate the blood vessels and help to lower blood pressure.  Water   pills (diuretics). Diuretics cause the kidneys to remove salt and water from the blood. The extra fluid is removed through urination, leaving a lower volume of blood that the heart has to pump.  Beta blockers. These improve heart  muscle strength and they prevent the heart from beating too quickly.  Digoxin. This increases the force of the heartbeat.  Healthy behavior changes These may include:  Reaching and maintaining a healthy weight.  Stopping smoking or chewing tobacco.  Eating heart-healthy foods.  Limiting or avoiding alcohol.  Stopping use of street drugs (illegal drugs).  Physical activity.  Other treatments These may include:  Surgery to open blocked coronary arteries or repair damaged heart valves.  Placement of a biventricular pacemaker to improve heart muscle function (cardiac resynchronization therapy). This device paces both the right ventricle and left ventricle.  Placement of a device to treat serious abnormal heart rhythms (implantable cardioverter defibrillator, or ICD).  Placement of a device to improve the pumping ability of the heart (left ventricular assist device, or LVAD).  Heart transplant. This can cure heart failure, and it is considered for certain patients who do not improve with other therapies.  Follow these instructions at home: Medicines  Take over-the-counter and prescription medicines only as told by your health care provider. Medicines are important in reducing the workload of your heart, slowing the progression of heart failure, and improving your symptoms. ? Do not stop taking your medicine unless your health care provider told you to do that. ? Do not skip any dose of medicine. ? Refill your prescriptions before you run out of medicine. You need your medicines every day. Eating and drinking   Eat heart-healthy foods. Talk with a dietitian to make an eating plan that is right for you. ? Choose foods that contain no trans fat and are low in saturated fat and cholesterol. Healthy choices include fresh or frozen fruits and vegetables, fish, lean meats, legumes, fat-free or low-fat dairy products, and whole-grain or high-fiber foods. ? Limit salt (sodium) if  directed by your health care provider. Sodium restriction may reduce symptoms of heart failure. Ask a dietitian to recommend heart-healthy seasonings. ? Use healthy cooking methods instead of frying. Healthy methods include roasting, grilling, broiling, baking, poaching, steaming, and stir-frying.  Limit your fluid intake if directed by your health care provider. Fluid restriction may reduce symptoms of heart failure. Lifestyle  Stop smoking or using chewing tobacco. Nicotine and tobacco can damage your heart and your blood vessels. Do not use nicotine gum or patches before talking to your health care provider.  Limit alcohol intake to no more than 1 drink per day for non-pregnant women and 2 drinks per day for men. One drink equals 12 oz of beer, 5 oz of wine, or 1 oz of hard liquor. ? Drinking more than that is harmful to your heart. Tell your health care provider if you drink alcohol several times a week. ? Talk with your health care provider about whether any level of alcohol use is safe for you. ? If your heart has already been damaged by alcohol or you have severe heart failure, drinking alcohol should be stopped completely.  Stop use of illegal drugs.  Lose weight if directed by your health care provider. Weight loss may reduce symptoms of heart failure.  Do moderate physical activity if directed by your health care provider. People who are elderly and people with severe heart failure should consult with a health care provider for physical activity recommendations.   Monitor important information  Weigh yourself every day. Keeping track of your weight daily helps you to notice excess fluid sooner. ? Weigh yourself every morning after you urinate and before you eat breakfast. ? Wear the same amount of clothing each time you weigh yourself. ? Record your daily weight. Provide your health care provider with your weight record.  Monitor and record your blood pressure as told by your health  care provider.  Check your pulse as told by your health care provider. Dealing with extreme temperatures  If the weather is extremely hot: ? Avoid vigorous physical activity. ? Use air conditioning or fans or seek a cooler location. ? Avoid caffeine and alcohol. ? Wear loose-fitting, lightweight, and light-colored clothing.  If the weather is extremely cold: ? Avoid vigorous physical activity. ? Layer your clothes. ? Wear mittens or gloves, a hat, and a scarf when you go outside. ? Avoid alcohol. General instructions  Manage other health conditions such as hypertension, diabetes, thyroid disease, or abnormal heart rhythms as told by your health care provider.  Learn to manage stress. If you need help to do this, ask your health care provider.  Plan rest periods when fatigued.  Get ongoing education and support as needed.  Participate in or seek rehabilitation as needed to maintain or improve independence and quality of life.  Stay up to date with immunizations. Keeping current on pneumococcal and influenza immunizations is especially important to prevent respiratory infections.  Keep all follow-up visits as told by your health care provider. This is important. Contact a health care provider if:  You have a rapid weight gain.  You have increasing shortness of breath that is unusual for you.  You are unable to participate in your usual physical activities.  You tire easily.  You cough more than normal, especially with physical activity.  You have any swelling or more swelling in areas such as your hands, feet, ankles, or abdomen.  You are unable to sleep because it is hard to breathe.  You feel like your heart is beating quickly (palpitations).  You become dizzy or light-headed when you stand up. Get help right away if:  You have difficulty breathing.  You notice or your family notices a change in your awareness, such as having trouble staying awake or having  difficulty with concentration.  You have pain or discomfort in your chest.  You have an episode of fainting (syncope). This information is not intended to replace advice given to you by your health care provider. Make sure you discuss any questions you have with your health care provider. Document Released: 09/03/2005 Document Revised: 05/08/2016 Document Reviewed: 03/28/2016 Elsevier Interactive Patient Education  2018 [employer]  

## 2017-11-05 NOTE — Progress Notes (Signed)
Subjective:    Patient ID: Mary Raymond, female    DOB: [birth year], 67 y.o.   MRN: [id number]  Pt presents to the office today for chronic follow up. Pt is noncompliant and has not followed up with opthalmology or cardiology as instructed due to financial concerns. Diabetes  She presents for her follow-up diabetic visit. She has type 2 diabetes mellitus. Her disease course has been worsening. There are no hypoglycemic associated symptoms. Associated symptoms include blurred vision, fatigue and foot paresthesias. Pertinent negatives for diabetes include no visual change. There are no hypoglycemic complications. Symptoms are worsening. Diabetic complications include peripheral neuropathy. Pertinent negatives for diabetic complications include no CVA or heart disease. Risk factors for coronary artery disease include diabetes mellitus, dyslipidemia, family history, female sex, hypertension, stress and sedentary lifestyle. She is following a generally unhealthy diet. Her overall blood glucose range is 180-200 mg/dl. Eye exam is not current.  Hypertension  This is a chronic problem. The current episode started more than 1 year ago. The problem has been waxing and waning since onset. The problem is uncontrolled. Associated symptoms include blurred vision, malaise/fatigue, peripheral edema and shortness of breath. Risk factors for coronary artery disease include diabetes mellitus, dyslipidemia, family history, obesity and sedentary lifestyle. The current treatment provides no improvement. Hypertensive end-organ damage includes heart failure. There is no history of CVA. Identifiable causes of hypertension include a thyroid problem.  Asthma  She complains of shortness of breath and wheezing. There is no cough. This is a chronic problem. The current episode started more than 1 year ago. The problem has been waxing and waning. Associated symptoms include malaise/fatigue. Her past medical history is significant  for asthma.  Thyroid Problem  Presents for follow-up visit. Symptoms include constipation, diarrhea and fatigue. Patient reports no visual change. The symptoms have been stable. Her past medical history is significant for heart failure.  Back Pain  This is a chronic problem. The current episode started more than 1 year ago. The problem occurs intermittently. The problem has been waxing and waning since onset. The pain is present in the lumbar spine. The quality of the pain is described as aching. The pain is at a severity of 10/10. The pain is moderate. Pertinent negatives include no bladder incontinence or bowel incontinence. Risk factors include obesity. She has tried analgesics and bed rest for the symptoms. The treatment provided mild relief.      Review of Systems  Constitutional: Positive for fatigue and malaise/fatigue.  Eyes: Positive for blurred vision.  Respiratory: Positive for shortness of breath and wheezing. Negative for cough.   Gastrointestinal: Positive for constipation and diarrhea. Negative for bowel incontinence.  Genitourinary: Negative for bladder incontinence.  Musculoskeletal: Positive for back pain.  All other systems reviewed and are negative.      Objective:   Physical Exam  Constitutional: She is oriented to person, place, and time. She appears well-developed and well-nourished. No distress.  Morbid obese   HENT:  Head: Normocephalic and atraumatic.  Right Ear: External ear normal.  Left Ear: External ear normal.  Nose: Nose normal.  Mouth/Throat: Posterior oropharyngeal erythema present.  Eyes: Pupils are equal, round, and reactive to light.  Neck: Normal range of motion. Neck supple. No thyromegaly present.  Cardiovascular: Normal rate, regular rhythm, normal heart sounds and intact distal pulses.  No murmur heard. Pulmonary/Chest: Effort normal and breath sounds normal. No respiratory distress. She has no wheezes.  Abdominal: Soft. Bowel sounds are  normal. She exhibits no  distension. There is no tenderness.  Musculoskeletal: She exhibits edema (3+ BLE). She exhibits no tenderness.  Pain in lower back with flexion and extension  Neurological: She is alert and oriented to person, place, and time.  Skin: Skin is warm and dry.  Psychiatric: She has a normal mood and affect. Her behavior is normal. Judgment and thought content normal.  Vitals reviewed.    BP (!) 178/74   Pulse 74   Temp 97.8 F (36.6 C) (Oral)   Ht '5\' 5"'$  (1.651 m)   Wt 275 lb (124.7 kg)   BMI 45.76 kg/m      Assessment & Plan:  1. Hypertension associated with diabetes ([place]) Pt has not taken any BP medications today Will start Lasix today  Follow up in 2 weeks to recheck - CMP14+EGFR - Ambulatory referral to Cardiology  2. Uncomplicated asthma, unspecified asthma severity, unspecified whether persistent - CMP14+EGFR  3. Hypothyroidism, unspecified type - CMP14+EGFR - TSH  4. Diabetes mellitus type 2, uncontrolled, without complications ([facility]) - KCL27+NTZG - Bayer DCA Hb A1c Waived - Ambulatory referral to Cardiology  5. Hyperlipidemia associated with type 2 diabetes mellitus ([facility]CC) - CMP14+EGFR - Lipid panel  6. Obesity, morbid, BMI 40.0-49.9 (HCC) - CMP14+EGFR  7. Chronic bilateral low back pain with bilateral sciatica - CMP14+EGFR - Ambulatory referral to Orthopedic Surgery - HYDROcodone-acetaminophen (NORCO) 7.5-325 MG tablet; Take 1 tablet by mouth at bedtime.  Dispense: 30 tablet; Refill: 0 - HYDROcodone-acetaminophen (NORCO) 7.5-325 MG tablet; Take 1 tablet by mouth at bedtime.  Dispense: 30 tablet; Refill: 0 - HYDROcodone-acetaminophen (NORCO) 7.5-325 MG tablet; Take 1 tablet by mouth at bedtime.  Dispense: 30 tablet; Refill: 0  8. Pain medication agreement signed - CMP14+EGFR - HYDROcodone-acetaminophen (NORCO) 7.5-325 MG tablet; Take 1 tablet by mouth at bedtime.  Dispense: 30 tablet; Refill: 0 - HYDROcodone-acetaminophen (NORCO)  7.5-325 MG tablet; Take 1 tablet by mouth at bedtime.  Dispense: 30 tablet; Refill: 0 - HYDROcodone-acetaminophen (NORCO) 7.5-325 MG tablet; Take 1 tablet by mouth at bedtime.  Dispense: 30 tablet; Refill: 0  9. Uncomplicated opioid dependence ([place]) - CMP14+EGFR - HYDROcodone-acetaminophen (NORCO) 7.5-325 MG tablet; Take 1 tablet by mouth at bedtime.  Dispense: 30 tablet; Refill: 0 - HYDROcodone-acetaminophen (NORCO) 7.5-325 MG tablet; Take 1 tablet by mouth at bedtime.  Dispense: 30 tablet; Refill: 0 - HYDROcodone-acetaminophen (NORCO) 7.5-325 MG tablet; Take 1 tablet by mouth at bedtime.  Dispense: 30 tablet; Refill: 0  10. Urinary frequency - CMP14+EGFR - Urinalysis, Complete - Urine Culture  11. Congestive heart failure, unspecified HF chronicity, unspecified heart failure type ([place]) Will start Lasix today Low salt diet Referral to Cardiologists, discussed importance of keep this appt since pt has been referred multiple times and not kept appt - CMP14+EGFR - Ambulatory referral to Cardiology - furosemide (LASIX) 20 MG tablet; Take 1 tablet (20 mg total) by mouth daily.  Dispense: 30 tablet; Refill: 3  12. Peripheral edema Compression hose Lasix started today - furosemide (LASIX) 20 MG tablet; Take 1 tablet (20 mg total) by mouth daily.  Dispense: 30 tablet; Refill: 3   Continue all meds Labs pending Health Maintenance reviewed Diet and exercise encouraged RTO 2 weeks to recheck HTN  Evelina Dun, FNP

## 2017-11-05 NOTE — Addendum Note (Signed)
Addended by: Shelbie Ammons on: 11/05/2017 01:00 PM   Modules accepted: Orders

## 2017-11-07 ENCOUNTER — Other Ambulatory Visit: Payer: Self-pay | Admitting: Family

## 2017-11-07 MED ORDER — CIPROFLOXACIN HCL 500 MG PO TABS: 500.0000 mg | ORAL_TABLET | Freq: Two times a day (BID) | ORAL | 0 refills | Status: DC

## 2017-11-08 LAB — URINE CULTURE

## 2017-11-10 LAB — TOXASSURE SELECT 13 (MW), URINE

## 2017-11-21 ENCOUNTER — Ambulatory Visit: Payer: Medicare HMO | Admitting: Family

## 2017-11-28 ENCOUNTER — Ambulatory Visit: Payer: Medicare HMO | Admitting: Family

## 2017-12-02 ENCOUNTER — Other Ambulatory Visit: Payer: Self-pay | Admitting: *Deleted

## 2017-12-02 MED ORDER — IPRATROPIUM-ALBUTEROL 20-100 MCG/ACT IN AERS: 1.0000 | INHALATION_SPRAY | Freq: Four times a day (QID) | RESPIRATORY_TRACT | 0 refills | Status: DC | PRN

## 2017-12-02 MED ORDER — ACCU-CHEK SOFTCLIX LANCETS MISC: 3 refills | Status: DC

## 2017-12-03 ENCOUNTER — Ambulatory Visit (INDEPENDENT_AMBULATORY_CARE_PROVIDER_SITE_OTHER): Payer: Medicare HMO | Admitting: Family

## 2017-12-03 ENCOUNTER — Encounter: Payer: Self-pay | Admitting: Family

## 2017-12-03 VITALS — BP 185/83 | HR 73 | Temp 96.8°F | Ht 65.0 in | Wt 283.0 lb

## 2017-12-03 DIAGNOSIS — E039 Hypothyroidism, unspecified: Secondary | ICD-10-CM | POA: Diagnosis not present

## 2017-12-03 DIAGNOSIS — R300 Dysuria: Secondary | ICD-10-CM | POA: Diagnosis not present

## 2017-12-03 DIAGNOSIS — E1159 Type 2 diabetes mellitus with other circulatory complications: Secondary | ICD-10-CM

## 2017-12-03 DIAGNOSIS — I10 Essential (primary) hypertension: Secondary | ICD-10-CM | POA: Diagnosis not present

## 2017-12-03 DIAGNOSIS — E785 Hyperlipidemia, unspecified: Secondary | ICD-10-CM | POA: Diagnosis not present

## 2017-12-03 DIAGNOSIS — E1165 Type 2 diabetes mellitus with hyperglycemia: Secondary | ICD-10-CM | POA: Diagnosis not present

## 2017-12-03 DIAGNOSIS — M5442 Lumbago with sciatica, left side: Secondary | ICD-10-CM | POA: Diagnosis not present

## 2017-12-03 DIAGNOSIS — J45909 Unspecified asthma, uncomplicated: Secondary | ICD-10-CM | POA: Diagnosis not present

## 2017-12-03 DIAGNOSIS — E1169 Type 2 diabetes mellitus with other specified complication: Secondary | ICD-10-CM | POA: Diagnosis not present

## 2017-12-03 LAB — URINALYSIS, COMPLETE
BILIRUBIN UA: NEGATIVE
KETONES UA: NEGATIVE
Nitrite, UA: NEGATIVE
SPEC GRAV UA: 1.025 (ref 1.005–1.030)
Urobilinogen, Ur: 0.2 mg/dL (ref 0.2–1.0)
pH, UA: 6 (ref 5.0–7.5)

## 2017-12-03 LAB — MICROSCOPIC EXAMINATION
RENAL EPITHEL UA: NONE SEEN /HPF
WBC, UA: >30 /hpf — AB (ref 0–?)

## 2017-12-03 LAB — BAYER DCA HB A1C WAIVED: HB A1C: 7.7 % — AB (ref <7.0)

## 2017-12-03 MED ORDER — METOPROLOL SUCCINATE ER 25 MG PO TB24: 25.0000 mg | ORAL_TABLET | Freq: Every day | ORAL | 3 refills | Status: DC

## 2017-12-03 NOTE — Patient Instructions (Signed)

## 2017-12-03 NOTE — Progress Notes (Signed)
   Subjective:    Patient ID: Mary Raymond, female    DOB: 31-Oct-1950, 67 y.o.   MRN: [id number]  PT presents to the office today to recheck HTN. PT's BP is not at goal.  Hypertension  This is a chronic problem. The current episode started more than 1 year ago. The problem has been waxing and waning since onset. The problem is uncontrolled. Associated symptoms include malaise/fatigue and peripheral edema. Pertinent negatives include no headaches or shortness of breath. Risk factors for coronary artery disease include diabetes mellitus, dyslipidemia, family history and obesity. Hypertensive end-organ damage includes CVA and heart failure. There is no history of CAD/MI.  Dysuria   This is a recurrent problem. The current episode started more than 1 year ago. The problem occurs intermittently. The problem has been waxing and waning. The quality of the pain is described as burning. Associated symptoms include frequency and urgency. Pertinent negatives include no hematuria, nausea or vomiting. She has tried increased fluids for the symptoms. Her past medical history is significant for recurrent UTIs.      Review of Systems  Constitutional: Positive for malaise/fatigue.  Respiratory: Negative for shortness of breath.   Gastrointestinal: Negative for nausea and vomiting.  Genitourinary: Positive for dysuria, frequency and urgency. Negative for hematuria.  Neurological: Negative for headaches.  All other systems reviewed and are negative.      Objective:   Physical Exam  Constitutional: She is oriented to person, place, and time. She appears well-developed and well-nourished. No distress.  Morbid obese   HENT:  Head: Normocephalic and atraumatic.  Right Ear: External ear normal.  Left Ear: External ear normal.  Nose: Nose normal.  Mouth/Throat: Oropharynx is clear and moist.  Eyes: Pupils are equal, round, and reactive to light.  Neck: Normal range of motion. Neck supple. No  thyromegaly present.  Cardiovascular: Normal rate, regular rhythm, normal heart sounds and intact distal pulses.  No murmur heard. Pulmonary/Chest: Effort normal and breath sounds normal. No respiratory distress. She has no wheezes.  Abdominal: Soft. Bowel sounds are normal. She exhibits no distension. There is no tenderness.  Musculoskeletal: Normal range of motion. She exhibits no edema or tenderness.  Neurological: She is alert and oriented to person, place, and time.  Skin: Skin is warm and dry.  Psychiatric: She has a normal mood and affect. Her behavior is normal. Judgment and thought content normal.  Vitals reviewed.     BP (!) 172/76   Pulse 77   Temp (!) 96.8 F (36 C) (Oral)   Ht 5\' 5"  (1.651 m)   Wt 283 lb (128.4 kg)   BMI 47.09 kg/m      Assessment & Plan:  1. Hypertension associated with diabetes ([place]) Will add metoprolol today -Dash diet information given -Exercise encouraged - Stress Management  -Continue current meds -RTO in 1 months - metoprolol succinate (TOPROL-XL) 25 MG 24 hr tablet; Take 1 tablet (25 mg total) by mouth daily.  Dispense: 90 tablet; Refill: 3  2. Dysuria - Urinalysis, Complete - Urine Culture  Pt states she forgot to get lab work done at last visit, will get it drawn today.  PT advised to take medications before seeing me!!! Will resubmit referrals since patient states she got a new phone and never received phone call   Evelina Dun, FNP

## 2017-12-04 ENCOUNTER — Other Ambulatory Visit: Payer: Medicare HMO

## 2017-12-04 ENCOUNTER — Other Ambulatory Visit: Payer: Self-pay

## 2017-12-04 ENCOUNTER — Other Ambulatory Visit: Payer: Self-pay | Admitting: Family Medicine

## 2017-12-04 DIAGNOSIS — E875 Hyperkalemia: Secondary | ICD-10-CM

## 2017-12-04 LAB — TSH: TSH: 3.05 u[IU]/mL (ref 0.450–4.500)

## 2017-12-04 LAB — CMP14+EGFR
A/G RATIO: 1.2 (ref 1.2–2.2)
ALBUMIN: 3.4 g/dL — AB (ref 3.6–4.8)
ALT: 11 IU/L (ref 0–32)
AST: 9 IU/L (ref 0–40)
Alkaline Phosphatase: 106 IU/L (ref 39–117)
BUN / CREAT RATIO: 31 — AB (ref 12–28)
BUN: 23 mg/dL (ref 8–27)
Bilirubin Total: 0.2 mg/dL (ref 0.0–1.2)
CO2: 25 mmol/L (ref 20–29)
Calcium: 9.4 mg/dL (ref 8.7–10.3)
Chloride: 105 mmol/L (ref 96–106)
Creatinine, Ser: 0.75 mg/dL (ref 0.57–1.00)
GFR calc Af Amer: 96 mL/min/{1.73_m2} (ref >59)
GFR calc non Af Amer: 83 mL/min/{1.73_m2} (ref >59)
GLOBULIN, TOTAL: 2.8 g/dL (ref 1.5–4.5)
Glucose: 190 mg/dL — ABNORMAL HIGH (ref 65–99)
POTASSIUM: 6.2 mmol/L — AB (ref 3.5–5.2)
SODIUM: 142 mmol/L (ref 134–144)
Total Protein: 6.2 g/dL (ref 6.0–8.5)

## 2017-12-04 LAB — LIPID PANEL
CHOL/HDL RATIO: 5.5 ratio — AB (ref 0.0–4.4)
Cholesterol, Total: 271 mg/dL — ABNORMAL HIGH (ref 100–199)
HDL: 49 mg/dL (ref >39)
LDL CALC: 177 mg/dL — AB (ref 0–99)
TRIGLYCERIDES: 224 mg/dL — AB (ref 0–149)
VLDL Cholesterol Cal: 45 mg/dL — ABNORMAL HIGH (ref 5–40)

## 2017-12-04 MED ORDER — CIPROFLOXACIN HCL 500 MG PO TABS
500.0000 mg | ORAL_TABLET | Freq: Two times a day (BID) | ORAL | 0 refills | Status: DC
Start: 2017-12-04 — End: 2018-02-25

## 2017-12-04 NOTE — Progress Notes (Unsigned)
Sent in ciprofloxacin based off of patient's urinalysis yesterday.

## 2017-12-04 NOTE — Progress Notes (Unsigned)
Patient's labwork drawn on 12/03/17 resulted an elevated potassium.  Per Dr. Warrick Parisian, patient needs to come in today to have redrawn.  Order placed for stat, patient aware

## 2017-12-05 ENCOUNTER — Other Ambulatory Visit: Payer: Self-pay | Admitting: Family

## 2017-12-05 LAB — POTASSIUM: POTASSIUM: 4.8 mmol/L (ref 3.5–5.2)

## 2017-12-05 MED ORDER — ROSUVASTATIN CALCIUM 10 MG PO TABS: 10.0000 mg | ORAL_TABLET | Freq: Every day | ORAL | 1 refills | Status: DC

## 2017-12-05 MED ORDER — NITROFURANTOIN MONOHYD MACRO 100 MG PO CAPS: 100.0000 mg | ORAL_CAPSULE | Freq: Two times a day (BID) | ORAL | 0 refills | Status: DC

## 2017-12-05 MED ORDER — SEMAGLUTIDE(0.25 OR 0.5MG/DOS) 2 MG/1.5ML ~~LOC~~ SOPN: PEN_INJECTOR | SUBCUTANEOUS | 1 refills | Status: AC

## 2017-12-06 ENCOUNTER — Other Ambulatory Visit: Payer: Self-pay | Admitting: *Deleted

## 2017-12-06 MED ORDER — IPRATROPIUM-ALBUTEROL 20-100 MCG/ACT IN AERS: 1.0000 | INHALATION_SPRAY | Freq: Four times a day (QID) | RESPIRATORY_TRACT | 1 refills | Status: DC | PRN

## 2017-12-08 LAB — URINE CULTURE

## 2017-12-09 ENCOUNTER — Telehealth: Payer: Self-pay | Admitting: Family

## 2017-12-09 MED ORDER — NITROFURANTOIN MONOHYD MACRO 100 MG PO CAPS: 100.0000 mg | ORAL_CAPSULE | Freq: Two times a day (BID) | ORAL | 0 refills | Status: DC

## 2017-12-09 NOTE — Telephone Encounter (Signed)
Prescription sent to pharmacy.

## 2017-12-10 NOTE — Telephone Encounter (Signed)
Patient aware via voicemail, and instructed to contact us if she was having any further problem.

## 2017-12-12 ENCOUNTER — Other Ambulatory Visit: Payer: Self-pay | Admitting: *Deleted

## 2017-12-12 DIAGNOSIS — E1165 Type 2 diabetes mellitus with hyperglycemia: Principal | ICD-10-CM

## 2017-12-12 DIAGNOSIS — IMO0001 Reserved for inherently not codable concepts without codable children: Secondary | ICD-10-CM

## 2017-12-12 DIAGNOSIS — Z794 Long term (current) use of insulin: Principal | ICD-10-CM

## 2017-12-12 MED ORDER — METFORMIN HCL 500 MG PO TABS: ORAL_TABLET | ORAL | 0 refills | Status: DC

## 2018-01-07 ENCOUNTER — Other Ambulatory Visit: Payer: Self-pay | Admitting: Family

## 2018-01-07 DIAGNOSIS — R609 Edema, unspecified: Secondary | ICD-10-CM

## 2018-01-07 DIAGNOSIS — I509 Heart failure, unspecified: Secondary | ICD-10-CM

## 2018-01-09 ENCOUNTER — Ambulatory Visit: Payer: Medicare HMO | Admitting: Family

## 2018-01-09 ENCOUNTER — Other Ambulatory Visit: Payer: Self-pay | Admitting: Family

## 2018-02-12 ENCOUNTER — Other Ambulatory Visit: Payer: Self-pay | Admitting: Family

## 2018-02-12 DIAGNOSIS — E1165 Type 2 diabetes mellitus with hyperglycemia: Principal | ICD-10-CM

## 2018-02-12 DIAGNOSIS — Z794 Long term (current) use of insulin: Principal | ICD-10-CM

## 2018-02-12 DIAGNOSIS — IMO0001 Reserved for inherently not codable concepts without codable children: Secondary | ICD-10-CM

## 2018-02-24 ENCOUNTER — Other Ambulatory Visit: Payer: Self-pay | Admitting: Family

## 2018-02-24 NOTE — Telephone Encounter (Signed)
appt made for 02/25/18, moved up from Thursday

## 2018-02-25 ENCOUNTER — Other Ambulatory Visit: Payer: Self-pay | Admitting: Family

## 2018-02-25 ENCOUNTER — Encounter: Payer: Self-pay | Admitting: Family

## 2018-02-25 ENCOUNTER — Ambulatory Visit (INDEPENDENT_AMBULATORY_CARE_PROVIDER_SITE_OTHER): Payer: Medicare HMO | Admitting: Family

## 2018-02-25 VITALS — BP 175/85 | HR 74 | Temp 98.1°F | Ht 65.0 in | Wt 266.4 lb

## 2018-02-25 DIAGNOSIS — Z0289 Encounter for other administrative examinations: Secondary | ICD-10-CM

## 2018-02-25 DIAGNOSIS — I10 Essential (primary) hypertension: Secondary | ICD-10-CM

## 2018-02-25 DIAGNOSIS — F332 Major depressive disorder, recurrent severe without psychotic features: Secondary | ICD-10-CM

## 2018-02-25 DIAGNOSIS — M159 Polyosteoarthritis, unspecified: Secondary | ICD-10-CM

## 2018-02-25 DIAGNOSIS — J45909 Unspecified asthma, uncomplicated: Secondary | ICD-10-CM | POA: Diagnosis not present

## 2018-02-25 DIAGNOSIS — E039 Hypothyroidism, unspecified: Secondary | ICD-10-CM | POA: Diagnosis not present

## 2018-02-25 DIAGNOSIS — M150 Primary generalized (osteo)arthritis: Secondary | ICD-10-CM

## 2018-02-25 DIAGNOSIS — F419 Anxiety disorder, unspecified: Secondary | ICD-10-CM | POA: Diagnosis not present

## 2018-02-25 DIAGNOSIS — Z9119 Patient's noncompliance with other medical treatment and regimen: Secondary | ICD-10-CM | POA: Diagnosis not present

## 2018-02-25 DIAGNOSIS — IMO0001 Reserved for inherently not codable concepts without codable children: Secondary | ICD-10-CM

## 2018-02-25 DIAGNOSIS — M5441 Lumbago with sciatica, right side: Secondary | ICD-10-CM

## 2018-02-25 DIAGNOSIS — F1120 Opioid dependence, uncomplicated: Secondary | ICD-10-CM

## 2018-02-25 DIAGNOSIS — Z91199 Patient's noncompliance with other medical treatment and regimen due to unspecified reason: Secondary | ICD-10-CM

## 2018-02-25 DIAGNOSIS — E785 Hyperlipidemia, unspecified: Secondary | ICD-10-CM | POA: Diagnosis not present

## 2018-02-25 DIAGNOSIS — E1159 Type 2 diabetes mellitus with other circulatory complications: Secondary | ICD-10-CM | POA: Diagnosis not present

## 2018-02-25 DIAGNOSIS — M5442 Lumbago with sciatica, left side: Secondary | ICD-10-CM

## 2018-02-25 DIAGNOSIS — G8929 Other chronic pain: Secondary | ICD-10-CM

## 2018-02-25 DIAGNOSIS — E1169 Type 2 diabetes mellitus with other specified complication: Secondary | ICD-10-CM

## 2018-02-25 DIAGNOSIS — R32 Unspecified urinary incontinence: Secondary | ICD-10-CM

## 2018-02-25 DIAGNOSIS — E1165 Type 2 diabetes mellitus with hyperglycemia: Secondary | ICD-10-CM

## 2018-02-25 LAB — URINALYSIS, COMPLETE
Bilirubin, UA: NEGATIVE
Glucose, UA: NEGATIVE
Ketones, UA: NEGATIVE
LEUKOCYTES UA: NEGATIVE
NITRITE UA: NEGATIVE
PH UA: 5 (ref 5.0–7.5)
Specific Gravity, UA: >1.03 — ABNORMAL HIGH (ref 1.005–1.030)
Urobilinogen, Ur: 0.2 mg/dL (ref 0.2–1.0)

## 2018-02-25 LAB — BAYER DCA HB A1C WAIVED: HB A1C: 8.3 % — AB (ref <7.0)

## 2018-02-25 LAB — MICROSCOPIC EXAMINATION: RENAL EPITHEL UA: NONE SEEN /HPF

## 2018-02-25 MED ORDER — SEMAGLUTIDE(0.25 OR 0.5MG/DOS) 2 MG/1.5ML ~~LOC~~ SOPN: PEN_INJECTOR | SUBCUTANEOUS | 1 refills | Status: AC

## 2018-02-25 MED ORDER — ALPRAZOLAM 0.5 MG PO TABS: ORAL_TABLET | ORAL | 2 refills | Status: DC

## 2018-02-25 MED ORDER — HYDROCODONE-ACETAMINOPHEN 7.5-325 MG PO TABS: 1.0000 | ORAL_TABLET | Freq: Every day | ORAL | 0 refills | Status: DC

## 2018-02-25 NOTE — Progress Notes (Signed)
Subjective:    Patient ID: Mary Raymond, female    DOB: 1951-08-09, 67 y.o.   MRN: [id number]  Pt presents to the office today for chronic follow up.Pt is noncompliant and hasnot followed up with opthalmology or cardiology as instructed due to financial concerns.  Pt states she has an abscess tooth and has not taken any of her medication this morning. Pt states she saw her dentist yesterday and was started on amoxicillin.   Diabetes  She presents for her follow-up diabetic visit. She has type 2 diabetes mellitus. Hypoglycemia symptoms include nervousness/anxiousness. Associated symptoms include blurred vision, fatigue and foot paresthesias. Symptoms are worsening. Risk factors for coronary artery disease include diabetes mellitus, dyslipidemia, hypertension, sedentary lifestyle and post-menopausal. She is following a generally unhealthy diet. Her overall blood glucose range is 140-180 mg/dl. Eye exam is not current.  Hypertension  This is a chronic problem. The current episode started more than 1 year ago. The problem has been waxing and waning since onset. The problem is uncontrolled. Associated symptoms include anxiety, blurred vision, malaise/fatigue and peripheral edema. Pertinent negatives include no shortness of breath. Risk factors for coronary artery disease include diabetes mellitus, dyslipidemia, family history, obesity and sedentary lifestyle. There is no history of kidney disease or CAD/MI. Identifiable causes of hypertension include a thyroid problem.  Asthma  She complains of cough, hoarse voice and wheezing. There is no shortness of breath. This is a chronic problem. The current episode started more than 1 year ago. The problem occurs intermittently. The problem has been waxing and waning. Associated symptoms include malaise/fatigue. Her past medical history is significant for asthma.  Arthritis  Presents for follow-up visit. She complains of pain and stiffness. The symptoms  have been stable. Affected location: back. Her pain is at a severity of 10/10. Associated symptoms include diarrhea and fatigue.  Thyroid Problem  Presents for follow-up visit. Symptoms include anxiety, constipation, diarrhea, fatigue and hoarse voice. The symptoms have been worsening.  Anxiety  Presents for follow-up visit. Symptoms include decreased concentration, excessive worry, irritability and nervous/anxious behavior. Patient reports no shortness of breath. Symptoms occur most days. The severity of symptoms is moderate.   Her past medical history is significant for asthma.  Depression         This is a chronic problem.  The current episode started more than 1 year ago.   The onset quality is gradual.   The problem occurs intermittently.  The problem has been waxing and waning since onset.  Associated symptoms include decreased concentration, fatigue and sad.  Associated symptoms include not irritable.  Past treatments include SSRIs - Selective serotonin reuptake inhibitors.  Past medical history includes thyroid problem and anxiety.   Urinary Incontinence  Pt states this has been going on for the last 3-4 weeks. States she is having to change her bedsheets during the night almost every night. Denies any dysuria.    Review of Systems  Constitutional: Positive for fatigue, irritability and malaise/fatigue.  HENT: Positive for hoarse voice.   Eyes: Positive for blurred vision.  Respiratory: Positive for cough and wheezing. Negative for shortness of breath.   Gastrointestinal: Positive for constipation and diarrhea.  Musculoskeletal: Positive for arthritis and stiffness.  Psychiatric/Behavioral: Positive for decreased concentration and depression. The patient is nervous/anxious.   All other systems reviewed and are negative.      Objective:   Physical Exam  Constitutional: She is oriented to person, place, and time. She appears well-developed and well-nourished. She is  not irritable. No  distress.  Morbid obese   HENT:  Head: Normocephalic and atraumatic.  Right Ear: External ear normal.  Left Ear: External ear normal.  Mouth/Throat: Oropharynx is clear and moist.  Eyes: Pupils are equal, round, and reactive to light.  Neck: Normal range of motion. Neck supple. No thyromegaly present.  Cardiovascular: Normal rate, regular rhythm and intact distal pulses.  Murmur heard. Pulmonary/Chest: Effort normal and breath sounds normal. No respiratory distress. She has no wheezes.  Abdominal: Soft. Bowel sounds are normal. She exhibits no distension. There is no tenderness.  Musculoskeletal: She exhibits no edema or tenderness.  Pain in lower lumbar with flexion and extension  Neurological: She is alert and oriented to person, place, and time. She has normal reflexes. No cranial nerve deficit.  Skin: Skin is warm and dry.  Psychiatric: She has a normal mood and affect. Her behavior is normal. Judgment and thought content normal.  Vitals reviewed.     BP (!) 175/85   Pulse 74   Temp 98.1 F (36.7 C) (Oral)   Ht _0  (1.651 m)   Wt 266 lb 6.4 oz (120.8 kg)   BMI 44.33 kg/m      Assessment & Plan:   Mary Raymond comes in today with chief complaint of pain management (refills)   Diagnosis and orders addressed:  1. Hypertension associated with diabetes ([place]) Pt needs to take BP meds when she gets home. She will come by office and get BP rechecked - CMP14+EGFR  2. Uncomplicated asthma, unspecified asthma severity, unspecified whether persistent - CMP14+EGFR  3. Hypothyroidism, unspecified type - CMP14+EGFR - TSH  4. Hyperlipidemia associated with type 2 diabetes mellitus (HCC) - CMP14+EGFR - Lipid panel  5. Diabetes mellitus type 2, uncontrolled, without complications (HCC) - Bayer DCA Hb A1c Waived - CMP14+EGFR  6. Primary osteoarthritis involving multiple joints - CMP14+EGFR  7. Obesity, morbid, BMI 40.0-49.9 (HCC) - CMP14+EGFR  8. Anxiety -  CMP14+EGFR - ALPRAZolam (XANAX) 0.5 MG tablet; TAKE 1 TABLET 2 TIMES A DAY AS NEEDED FOR ANXIETY  Dispense: 45 tablet; Refill: 2  9. Severe episode of recurrent major depressive disorder, without psychotic features ([place]) - CMP14+EGFR  10. Chronic bilateral low back pain with bilateral sciatica - CMP14+EGFR - HYDROcodone-acetaminophen (NORCO) 7.5-325 MG tablet; Take 1 tablet by mouth at bedtime.  Dispense: 30 tablet; Refill: 0 - HYDROcodone-acetaminophen (NORCO) 7.5-325 MG tablet; Take 1 tablet by mouth at bedtime.  Dispense: 30 tablet; Refill: 0 - HYDROcodone-acetaminophen (NORCO) 7.5-325 MG tablet; Take 1 tablet by mouth at bedtime.  Dispense: 30 tablet; Refill: 0  11. Noncompliance - CMP14+EGFR  12. Uncomplicated opioid dependence ([place]) - CMP14+EGFR - HYDROcodone-acetaminophen (NORCO) 7.5-325 MG tablet; Take 1 tablet by mouth at bedtime.  Dispense: 30 tablet; Refill: 0 - HYDROcodone-acetaminophen (NORCO) 7.5-325 MG tablet; Take 1 tablet by mouth at bedtime.  Dispense: 30 tablet; Refill: 0 - HYDROcodone-acetaminophen (NORCO) 7.5-325 MG tablet; Take 1 tablet by mouth at bedtime.  Dispense: 30 tablet; Refill: 0  13. Pain medication agreement signed - CMP14+EGFR - HYDROcodone-acetaminophen (NORCO) 7.5-325 MG tablet; Take 1 tablet by mouth at bedtime.  Dispense: 30 tablet; Refill: 0 - HYDROcodone-acetaminophen (NORCO) 7.5-325 MG tablet; Take 1 tablet by mouth at bedtime.  Dispense: 30 tablet; Refill: 0 - HYDROcodone-acetaminophen (NORCO) 7.5-325 MG tablet; Take 1 tablet by mouth at bedtime.  Dispense: 30 tablet; Refill: 0  14. Urinary incontinence, unspecified type - Urinalysis, Complete - Urine Culture  We have do several referrals to Cardiologists  and Ortho. Referral are always cancelled  Because she has not been answering her calls. I wrote each of these numbers for to call.   We also discussed xanax and controlled substances. I have decreased her xanax from #60 a month to #45 a  month. We our goal of stopping xanax completely.  Labs pending Health Maintenance reviewed Diet and exercise encouraged  Follow up plan: 3 months    Evelina Dun, FNP

## 2018-02-25 NOTE — Patient Instructions (Signed)
Preventing Cerebrovascular Disease Arteries are blood vessels that carry blood that contains oxygen from the heart to all parts of the body. Cerebrovascular disease affects arteries that supply the brain. Any condition that blocks or disrupts blood flow to the brain can cause cerebrovascular disease. Brain cells that lose blood supply start to die within minutes (stroke). Stroke is the main danger of cerebrovascular disease. Atherosclerosis and high blood pressure are common causes of cerebrovascular disease. Atherosclerosis is narrowing and hardening of an artery that results when fat, cholesterol, calcium, or other substances (plaque) build up inside an artery. Plaque reduces blood flow through the artery. High blood pressure increases the risk of bleeding inside the brain. Making diet and lifestyle changes to prevent atherosclerosis and high blood pressure lowers your risk of cerebrovascular disease. What nutrition changes can be made?  Eat more fruits, vegetables, and whole grains.  Reduce how much saturated fat you eat. To do this, eat less red meat and fewer full-fat dairy products.  Eat healthy proteins instead of red meat. Healthy proteins include: ? Fish. Eat fish that contains heart-healthy omega-3 fatty acids, twice a week. Examples include salmon, albacore tuna, mackerel, and herring. ? Chicken. ? Nuts. ? Low-fat or nonfat yogurt.  Avoid processed meats, like bacon and lunchmeat.  Avoid foods that contain: ? A lot of sugar, such as sweets and drinks with added sugar. ? A lot of salt (sodium). Avoid adding extra salt to your food, as told by your health care provider. ? Trans fats, such as margarine and baked goods. Trans fats may be listed as "partially hydrogenated oils" on food labels.  Check food labels to see how much sodium, sugar, and trans fats are in foods.  Use vegetable oils that contain low amounts of saturated fat, such as olive oil or canola oil. What lifestyle  changes can be made?  Drink alcohol in moderation. This means no more than 1 drink a day for nonpregnant women and 2 drinks a day for men. One drink equals 12 oz of beer, 5 oz of wine, or 1 oz of hard liquor.  If you are overweight, ask your health care provider to recommend a weight-loss plan for you. Losing 5-10 lb (2.2-4.5 kg) can reduce your risk of diabetes, atherosclerosis, and high blood pressure.  Exercise for 30?60 minutes on most days, or as much as told by your health care provider. ? Do moderate-intensity exercise, such as brisk walking, bicycling, and water aerobics. Ask your health care provider which activities are safe for you.  Do not use any products that contain nicotine or tobacco, such as cigarettes and e-cigarettes. If you need help quitting, ask your health care provider. Why are these changes important? Making these changes lowers your risk of many diseases that can cause cerebrovascular disease and stroke. Stroke is a leading cause of death and disability. Making these changes also improves your overall health and quality of life. What can I do to lower my risk? The following factors make you more likely to develop cerebrovascular disease:  Being overweight.  Smoking.  Being physically inactive.  Eating a high-fat diet.  Having certain health conditions, such as: ? Diabetes. ? High blood pressure. ? Heart disease. ? Atherosclerosis. ? High cholesterol. ? Sickle cell disease.  Talk with your health care provider about your risk for cerebrovascular disease. Work with your health care provider to control diseases that you have that may contribute to cerebrovascular disease. Your health care provider may prescribe medicines to help prevent  major causes of cerebrovascular disease. Where to find more information: Learn more about preventing cerebrovascular disease from:  [place], [facility], and [place]:  MoAnalyst.de  Centers for Disease Control and Prevention: http://www.curry-wood.biz/  Summary  Cerebrovascular disease can lead to a stroke.  Atherosclerosis and high blood pressure are major causes of cerebrovascular disease.  Making diet and lifestyle changes can reduce your risk of cerebrovascular disease.  Work with your health care provider to get your risk factors under control to reduce your risk of cerebrovascular disease. This information is not intended to replace advice given to you by your health care provider. Make sure you discuss any questions you have with your health care provider. Document Released: 09/18/2015 Document Revised: 03/23/2016 Document Reviewed: 09/18/2015 Elsevier Interactive Patient Education  2018 [employer].

## 2018-02-26 LAB — LIPID PANEL
CHOLESTEROL TOTAL: 201 mg/dL — AB (ref 100–199)
Chol/HDL Ratio: 4.8 ratio — ABNORMAL HIGH (ref 0.0–4.4)
HDL: 42 mg/dL (ref >39)
LDL CALC: 118 mg/dL — AB (ref 0–99)
TRIGLYCERIDES: 207 mg/dL — AB (ref 0–149)
VLDL CHOLESTEROL CAL: 41 mg/dL — AB (ref 5–40)

## 2018-02-26 LAB — CMP14+EGFR
ALBUMIN: 3.5 g/dL — AB (ref 3.6–4.8)
ALT: 9 IU/L (ref 0–32)
AST: 12 IU/L (ref 0–40)
Albumin/Globulin Ratio: 1.3 (ref 1.2–2.2)
Alkaline Phosphatase: 118 IU/L — ABNORMAL HIGH (ref 39–117)
BUN / CREAT RATIO: 17 (ref 12–28)
BUN: 15 mg/dL (ref 8–27)
Bilirubin Total: 0.4 mg/dL (ref 0.0–1.2)
CO2: 23 mmol/L (ref 20–29)
CREATININE: 0.89 mg/dL (ref 0.57–1.00)
Calcium: 9.3 mg/dL (ref 8.7–10.3)
Chloride: 100 mmol/L (ref 96–106)
GFR calc Af Amer: 78 mL/min/{1.73_m2} (ref >59)
GFR calc non Af Amer: 68 mL/min/{1.73_m2} (ref >59)
GLUCOSE: 144 mg/dL — AB (ref 65–99)
Globulin, Total: 2.8 g/dL (ref 1.5–4.5)
Potassium: 4.9 mmol/L (ref 3.5–5.2)
Sodium: 137 mmol/L (ref 134–144)
Total Protein: 6.3 g/dL (ref 6.0–8.5)

## 2018-02-26 LAB — URINE CULTURE

## 2018-02-26 LAB — TSH: TSH: 2.86 u[IU]/mL (ref 0.450–4.500)

## 2018-02-27 ENCOUNTER — Ambulatory Visit: Payer: Medicare HMO | Admitting: Family

## 2018-02-27 ENCOUNTER — Other Ambulatory Visit: Payer: Self-pay | Admitting: Family

## 2018-02-27 MED ORDER — ROSUVASTATIN CALCIUM 20 MG PO TABS: 20.0000 mg | ORAL_TABLET | Freq: Every day | ORAL | 11 refills | Status: DC

## 2018-03-13 ENCOUNTER — Encounter: Payer: Self-pay | Admitting: *Deleted

## 2018-03-15 ENCOUNTER — Other Ambulatory Visit: Payer: Self-pay | Admitting: Family

## 2018-03-15 DIAGNOSIS — E1165 Type 2 diabetes mellitus with hyperglycemia: Principal | ICD-10-CM

## 2018-03-15 DIAGNOSIS — Z794 Long term (current) use of insulin: Secondary | ICD-10-CM

## 2018-03-15 DIAGNOSIS — IMO0001 Reserved for inherently not codable concepts without codable children: Secondary | ICD-10-CM

## 2018-03-17 ENCOUNTER — Other Ambulatory Visit: Payer: Self-pay | Admitting: Family

## 2018-03-17 ENCOUNTER — Telehealth: Payer: Self-pay | Admitting: Family

## 2018-03-17 NOTE — Telephone Encounter (Signed)
No samples available. Patient notified

## 2018-03-24 ENCOUNTER — Other Ambulatory Visit: Payer: Self-pay | Admitting: Family

## 2018-03-25 ENCOUNTER — Other Ambulatory Visit: Payer: Self-pay | Admitting: Family

## 2018-05-01 ENCOUNTER — Telehealth: Payer: Self-pay | Admitting: Family

## 2018-05-01 NOTE — Telephone Encounter (Signed)
Pharmacy aware that [facility] prescribed both medications

## 2018-05-18 DIAGNOSIS — I639 Cerebral infarction, unspecified: Secondary | ICD-10-CM

## 2018-05-18 HISTORY — DX: Cerebral infarction, unspecified: I63.9

## 2018-05-19 ENCOUNTER — Other Ambulatory Visit: Payer: Self-pay | Admitting: Family

## 2018-05-28 ENCOUNTER — Other Ambulatory Visit: Payer: Self-pay | Admitting: Family

## 2018-05-28 DIAGNOSIS — Z794 Long term (current) use of insulin: Secondary | ICD-10-CM

## 2018-05-28 DIAGNOSIS — IMO0001 Reserved for inherently not codable concepts without codable children: Secondary | ICD-10-CM

## 2018-05-28 DIAGNOSIS — E1165 Type 2 diabetes mellitus with hyperglycemia: Principal | ICD-10-CM

## 2018-06-04 ENCOUNTER — Telehealth: Payer: Self-pay | Admitting: Family

## 2018-06-04 NOTE — Telephone Encounter (Signed)
Aware.  Appointment made for refills.

## 2018-06-05 ENCOUNTER — Ambulatory Visit (INDEPENDENT_AMBULATORY_CARE_PROVIDER_SITE_OTHER): Payer: Medicare HMO | Admitting: Family

## 2018-06-05 ENCOUNTER — Encounter: Payer: Self-pay | Admitting: Family

## 2018-06-05 ENCOUNTER — Other Ambulatory Visit: Payer: Self-pay | Admitting: Family

## 2018-06-05 ENCOUNTER — Encounter (HOSPITAL_COMMUNITY): Payer: Self-pay | Admitting: Emergency Medicine

## 2018-06-05 ENCOUNTER — Other Ambulatory Visit: Payer: Self-pay

## 2018-06-05 ENCOUNTER — Inpatient Hospital Stay (HOSPITAL_COMMUNITY)
Admission: EM | Admit: 2018-06-05 | Discharge: 2018-06-19 | DRG: 246 | Disposition: A | Discharge status: Skilled Nursing Facility | Payer: Medicare HMO | Attending: Internal Medicine | Admitting: Internal Medicine

## 2018-06-05 ENCOUNTER — Emergency Department (HOSPITAL_COMMUNITY): Payer: Medicare HMO

## 2018-06-05 VITALS — BP 167/67 | HR 70 | Temp 97.0°F | Ht 65.0 in | Wt 279.2 lb

## 2018-06-05 DIAGNOSIS — I213 ST elevation (STEMI) myocardial infarction of unspecified site: Secondary | ICD-10-CM

## 2018-06-05 DIAGNOSIS — Z79899 Other long term (current) drug therapy: Secondary | ICD-10-CM

## 2018-06-05 DIAGNOSIS — Z0289 Encounter for other administrative examinations: Secondary | ICD-10-CM

## 2018-06-05 DIAGNOSIS — M797 Fibromyalgia: Secondary | ICD-10-CM | POA: Diagnosis present

## 2018-06-05 DIAGNOSIS — R29702 NIHSS score 2: Secondary | ICD-10-CM | POA: Diagnosis not present

## 2018-06-05 DIAGNOSIS — E1122 Type 2 diabetes mellitus with diabetic chronic kidney disease: Secondary | ICD-10-CM | POA: Diagnosis present

## 2018-06-05 DIAGNOSIS — E1165 Type 2 diabetes mellitus with hyperglycemia: Principal | ICD-10-CM

## 2018-06-05 DIAGNOSIS — I63432 Cerebral infarction due to embolism of left posterior cerebral artery: Secondary | ICD-10-CM | POA: Diagnosis not present

## 2018-06-05 DIAGNOSIS — I441 Atrioventricular block, second degree: Secondary | ICD-10-CM | POA: Diagnosis present

## 2018-06-05 DIAGNOSIS — F419 Anxiety disorder, unspecified: Secondary | ICD-10-CM | POA: Diagnosis present

## 2018-06-05 DIAGNOSIS — T508X5A Adverse effect of diagnostic agents, initial encounter: Secondary | ICD-10-CM | POA: Diagnosis not present

## 2018-06-05 DIAGNOSIS — R079 Chest pain, unspecified: Secondary | ICD-10-CM

## 2018-06-05 DIAGNOSIS — I2510 Atherosclerotic heart disease of native coronary artery without angina pectoris: Secondary | ICD-10-CM | POA: Diagnosis present

## 2018-06-05 DIAGNOSIS — I208 Other forms of angina pectoris: Secondary | ICD-10-CM | POA: Diagnosis not present

## 2018-06-05 DIAGNOSIS — E11649 Type 2 diabetes mellitus with hypoglycemia without coma: Secondary | ICD-10-CM | POA: Diagnosis present

## 2018-06-05 DIAGNOSIS — E1159 Type 2 diabetes mellitus with other circulatory complications: Secondary | ICD-10-CM | POA: Diagnosis present

## 2018-06-05 DIAGNOSIS — I6340 Cerebral infarction due to embolism of unspecified cerebral artery: Secondary | ICD-10-CM

## 2018-06-05 DIAGNOSIS — M5441 Lumbago with sciatica, right side: Secondary | ICD-10-CM

## 2018-06-05 DIAGNOSIS — I9789 Other postprocedural complications and disorders of the circulatory system, not elsewhere classified: Secondary | ICD-10-CM | POA: Diagnosis not present

## 2018-06-05 DIAGNOSIS — Z888 Allergy status to other drugs, medicaments and biological substances status: Secondary | ICD-10-CM

## 2018-06-05 DIAGNOSIS — Z955 Presence of coronary angioplasty implant and graft: Secondary | ICD-10-CM

## 2018-06-05 DIAGNOSIS — G8929 Other chronic pain: Secondary | ICD-10-CM | POA: Diagnosis present

## 2018-06-05 DIAGNOSIS — H5340 Unspecified visual field defects: Secondary | ICD-10-CM | POA: Diagnosis present

## 2018-06-05 DIAGNOSIS — M109 Gout, unspecified: Secondary | ICD-10-CM | POA: Diagnosis present

## 2018-06-05 DIAGNOSIS — E039 Hypothyroidism, unspecified: Secondary | ICD-10-CM

## 2018-06-05 DIAGNOSIS — R739 Hyperglycemia, unspecified: Secondary | ICD-10-CM | POA: Diagnosis not present

## 2018-06-05 DIAGNOSIS — I214 Non-ST elevation (NSTEMI) myocardial infarction: Secondary | ICD-10-CM | POA: Diagnosis not present

## 2018-06-05 DIAGNOSIS — Z9119 Patient's noncompliance with other medical treatment and regimen: Secondary | ICD-10-CM

## 2018-06-05 DIAGNOSIS — Z803 Family history of malignant neoplasm of breast: Secondary | ICD-10-CM

## 2018-06-05 DIAGNOSIS — E1169 Type 2 diabetes mellitus with other specified complication: Secondary | ICD-10-CM | POA: Diagnosis present

## 2018-06-05 DIAGNOSIS — R0602 Shortness of breath: Secondary | ICD-10-CM | POA: Diagnosis not present

## 2018-06-05 DIAGNOSIS — I440 Atrioventricular block, first degree: Secondary | ICD-10-CM | POA: Diagnosis not present

## 2018-06-05 DIAGNOSIS — N183 Chronic kidney disease, stage 3 (moderate): Secondary | ICD-10-CM | POA: Diagnosis present

## 2018-06-05 DIAGNOSIS — I63532 Cerebral infarction due to unspecified occlusion or stenosis of left posterior cerebral artery: Secondary | ICD-10-CM | POA: Diagnosis not present

## 2018-06-05 DIAGNOSIS — R001 Bradycardia, unspecified: Secondary | ICD-10-CM | POA: Diagnosis present

## 2018-06-05 DIAGNOSIS — E8779 Other fluid overload: Secondary | ICD-10-CM | POA: Diagnosis not present

## 2018-06-05 DIAGNOSIS — R29810 Facial weakness: Secondary | ICD-10-CM | POA: Diagnosis not present

## 2018-06-05 DIAGNOSIS — Z794 Long term (current) use of insulin: Principal | ICD-10-CM

## 2018-06-05 DIAGNOSIS — Z91199 Patient's noncompliance with other medical treatment and regimen due to unspecified reason: Secondary | ICD-10-CM

## 2018-06-05 DIAGNOSIS — R110 Nausea: Secondary | ICD-10-CM | POA: Diagnosis not present

## 2018-06-05 DIAGNOSIS — Z7982 Long term (current) use of aspirin: Secondary | ICD-10-CM

## 2018-06-05 DIAGNOSIS — I97821 Postprocedural cerebrovascular infarction during other surgery: Secondary | ICD-10-CM | POA: Diagnosis not present

## 2018-06-05 DIAGNOSIS — IMO0001 Reserved for inherently not codable concepts without codable children: Secondary | ICD-10-CM

## 2018-06-05 DIAGNOSIS — R748 Abnormal levels of other serum enzymes: Secondary | ICD-10-CM | POA: Diagnosis not present

## 2018-06-05 DIAGNOSIS — Z6841 Body Mass Index (BMI) 40.0 and over, adult: Secondary | ICD-10-CM

## 2018-06-05 DIAGNOSIS — Z01810 Encounter for preprocedural cardiovascular examination: Secondary | ICD-10-CM | POA: Diagnosis not present

## 2018-06-05 DIAGNOSIS — S0003XA Contusion of scalp, initial encounter: Secondary | ICD-10-CM | POA: Diagnosis not present

## 2018-06-05 DIAGNOSIS — I158 Other secondary hypertension: Secondary | ICD-10-CM | POA: Diagnosis not present

## 2018-06-05 DIAGNOSIS — Z833 Family history of diabetes mellitus: Secondary | ICD-10-CM

## 2018-06-05 DIAGNOSIS — H5461 Unqualified visual loss, right eye, normal vision left eye: Secondary | ICD-10-CM | POA: Diagnosis present

## 2018-06-05 DIAGNOSIS — I700 Atherosclerosis of aorta: Secondary | ICD-10-CM | POA: Diagnosis present

## 2018-06-05 DIAGNOSIS — I10 Essential (primary) hypertension: Secondary | ICD-10-CM

## 2018-06-05 DIAGNOSIS — R413 Other amnesia: Secondary | ICD-10-CM | POA: Diagnosis not present

## 2018-06-05 DIAGNOSIS — E785 Hyperlipidemia, unspecified: Secondary | ICD-10-CM

## 2018-06-05 DIAGNOSIS — J45909 Unspecified asthma, uncomplicated: Secondary | ICD-10-CM | POA: Diagnosis present

## 2018-06-05 DIAGNOSIS — N990 Postprocedural (acute) (chronic) kidney failure: Secondary | ICD-10-CM | POA: Diagnosis not present

## 2018-06-05 DIAGNOSIS — Z825 Family history of asthma and other chronic lower respiratory diseases: Secondary | ICD-10-CM

## 2018-06-05 DIAGNOSIS — M5442 Lumbago with sciatica, left side: Secondary | ICD-10-CM

## 2018-06-05 DIAGNOSIS — R7989 Other specified abnormal findings of blood chemistry: Secondary | ICD-10-CM

## 2018-06-05 DIAGNOSIS — E119 Type 2 diabetes mellitus without complications: Secondary | ICD-10-CM | POA: Diagnosis not present

## 2018-06-05 DIAGNOSIS — F1120 Opioid dependence, uncomplicated: Secondary | ICD-10-CM

## 2018-06-05 DIAGNOSIS — N141 Nephropathy induced by other drugs, medicaments and biological substances: Secondary | ICD-10-CM | POA: Diagnosis not present

## 2018-06-05 DIAGNOSIS — H53461 Homonymous bilateral field defects, right side: Secondary | ICD-10-CM | POA: Diagnosis not present

## 2018-06-05 DIAGNOSIS — F0390 Unspecified dementia without behavioral disturbance: Secondary | ICD-10-CM | POA: Diagnosis present

## 2018-06-05 DIAGNOSIS — I152 Hypertension secondary to endocrine disorders: Secondary | ICD-10-CM

## 2018-06-05 DIAGNOSIS — E8770 Fluid overload, unspecified: Secondary | ICD-10-CM | POA: Diagnosis present

## 2018-06-05 DIAGNOSIS — D638 Anemia in other chronic diseases classified elsewhere: Secondary | ICD-10-CM | POA: Diagnosis present

## 2018-06-05 DIAGNOSIS — M2550 Pain in unspecified joint: Secondary | ICD-10-CM | POA: Diagnosis not present

## 2018-06-05 DIAGNOSIS — I4891 Unspecified atrial fibrillation: Secondary | ICD-10-CM

## 2018-06-05 DIAGNOSIS — I63512 Cerebral infarction due to unspecified occlusion or stenosis of left middle cerebral artery: Secondary | ICD-10-CM | POA: Diagnosis not present

## 2018-06-05 DIAGNOSIS — R0789 Other chest pain: Secondary | ICD-10-CM

## 2018-06-05 DIAGNOSIS — I959 Hypotension, unspecified: Secondary | ICD-10-CM | POA: Diagnosis not present

## 2018-06-05 DIAGNOSIS — Z79890 Hormone replacement therapy (postmenopausal): Secondary | ICD-10-CM

## 2018-06-05 DIAGNOSIS — R4182 Altered mental status, unspecified: Secondary | ICD-10-CM | POA: Diagnosis not present

## 2018-06-05 DIAGNOSIS — I340 Nonrheumatic mitral (valve) insufficiency: Secondary | ICD-10-CM | POA: Diagnosis not present

## 2018-06-05 DIAGNOSIS — I25110 Atherosclerotic heart disease of native coronary artery with unstable angina pectoris: Secondary | ICD-10-CM | POA: Diagnosis not present

## 2018-06-05 DIAGNOSIS — R778 Other specified abnormalities of plasma proteins: Secondary | ICD-10-CM

## 2018-06-05 DIAGNOSIS — Z7401 Bed confinement status: Secondary | ICD-10-CM | POA: Diagnosis not present

## 2018-06-05 DIAGNOSIS — H539 Unspecified visual disturbance: Secondary | ICD-10-CM | POA: Diagnosis not present

## 2018-06-05 DIAGNOSIS — Z818 Family history of other mental and behavioral disorders: Secondary | ICD-10-CM

## 2018-06-05 DIAGNOSIS — G459 Transient cerebral ischemic attack, unspecified: Secondary | ICD-10-CM | POA: Diagnosis not present

## 2018-06-05 DIAGNOSIS — Z800 Family history of malignant neoplasm of digestive organs: Secondary | ICD-10-CM

## 2018-06-05 DIAGNOSIS — N179 Acute kidney failure, unspecified: Secondary | ICD-10-CM | POA: Diagnosis not present

## 2018-06-05 DIAGNOSIS — Z8249 Family history of ischemic heart disease and other diseases of the circulatory system: Secondary | ICD-10-CM

## 2018-06-05 DIAGNOSIS — Z90710 Acquired absence of both cervix and uterus: Secondary | ICD-10-CM

## 2018-06-05 DIAGNOSIS — Z823 Family history of stroke: Secondary | ICD-10-CM

## 2018-06-05 LAB — BASIC METABOLIC PANEL
ANION GAP: 8 (ref 5–15)
BUN: 20 mg/dL (ref 8–23)
CO2: 25 mmol/L (ref 22–32)
Calcium: 9.1 mg/dL (ref 8.9–10.3)
Chloride: 106 mmol/L (ref 98–111)
Creatinine, Ser: 0.97 mg/dL (ref 0.44–1.00)
GFR calc Af Amer: >60 mL/min (ref >60)
GFR, EST NON AFRICAN AMERICAN: 60 mL/min — AB (ref >60)
Glucose, Bld: 70 mg/dL (ref 70–99)
Potassium: 4 mmol/L (ref 3.5–5.1)
SODIUM: 139 mmol/L (ref 135–145)

## 2018-06-05 LAB — HEPARIN LEVEL (UNFRACTIONATED): Heparin Unfractionated: <0.1 IU/mL — ABNORMAL LOW (ref 0.30–0.70)

## 2018-06-05 LAB — I-STAT TROPONIN, ED: Troponin i, poc: 0.11 ng/mL (ref 0.00–0.08)

## 2018-06-05 LAB — CBG MONITORING, ED
GLUCOSE-CAPILLARY: 23 mg/dL — AB (ref 70–99)
Glucose-Capillary: 123 mg/dL — ABNORMAL HIGH (ref 70–99)

## 2018-06-05 LAB — CBC
HCT: 33.1 % — ABNORMAL LOW (ref 36.0–46.0)
HEMOGLOBIN: 10.1 g/dL — AB (ref 12.0–15.0)
MCH: 24.7 pg — ABNORMAL LOW (ref 26.0–34.0)
MCHC: 30.5 g/dL (ref 30.0–36.0)
MCV: 80.9 fL (ref 78.0–100.0)
Platelets: 257 10*3/uL (ref 150–400)
RBC: 4.09 MIL/uL (ref 3.87–5.11)
RDW: 16.5 % — ABNORMAL HIGH (ref 11.5–15.5)
WBC: 12.5 10*3/uL — AB (ref 4.0–10.5)

## 2018-06-05 LAB — TROPONIN I: Troponin I: 0.25 ng/mL (ref <0.03)

## 2018-06-05 LAB — GLUCOSE, CAPILLARY: Glucose-Capillary: 87 mg/dL (ref 70–99)

## 2018-06-05 LAB — TSH: TSH: 1.822 u[IU]/mL (ref 0.350–4.500)

## 2018-06-05 LAB — APTT: aPTT: 32 seconds (ref 24–36)

## 2018-06-05 LAB — BRAIN NATRIURETIC PEPTIDE: B Natriuretic Peptide: 197 pg/mL — ABNORMAL HIGH (ref 0.0–100.0)

## 2018-06-05 MED ORDER — ASPIRIN 325 MG PO TABS
325.0000 mg | ORAL_TABLET | Freq: Once | ORAL | Status: AC
  Administered 2018-06-05: 325 mg via ORAL
  Filled 2018-06-05: qty 1

## 2018-06-05 MED ORDER — HYDROCODONE-ACETAMINOPHEN 7.5-325 MG PO TABS
1.0000 | ORAL_TABLET | Freq: Every day | ORAL | Status: DC
  Administered 2018-06-05 – 2018-06-16 (×12): 1 via ORAL
  Filled 2018-06-05 (×12): qty 1

## 2018-06-05 MED ORDER — DEXTROSE 50 % IV SOLN
INTRAVENOUS | Status: AC
  Administered 2018-06-05: 50 mL
  Filled 2018-06-05: qty 50

## 2018-06-05 MED ORDER — METOPROLOL TARTRATE 5 MG/5ML IV SOLN
5.0000 mg | Freq: Once | INTRAVENOUS | Status: AC
  Administered 2018-06-05: 5 mg via INTRAVENOUS
  Filled 2018-06-05: qty 5

## 2018-06-05 MED ORDER — FLUTICASONE PROPIONATE 50 MCG/ACT NA SUSP
2.0000 | Freq: Every day | NASAL | Status: DC
  Administered 2018-06-06 – 2018-06-19 (×12): 2 via NASAL
  Filled 2018-06-05 (×3): qty 16

## 2018-06-05 MED ORDER — METOPROLOL SUCCINATE ER 25 MG PO TB24: 25.0000 mg | ORAL_TABLET | Freq: Every day | ORAL | Status: DC

## 2018-06-05 MED ORDER — ONDANSETRON HCL 4 MG/2ML IJ SOLN
4.0000 mg | Freq: Four times a day (QID) | INTRAMUSCULAR | Status: DC | PRN
  Administered 2018-06-07: 4 mg via INTRAVENOUS
  Filled 2018-06-05: qty 2

## 2018-06-05 MED ORDER — INSULIN ASPART 100 UNIT/ML ~~LOC~~ SOLN: 0.0000 [IU] | Freq: Three times a day (TID) | SUBCUTANEOUS | Status: DC

## 2018-06-05 MED ORDER — ALBUTEROL SULFATE (2.5 MG/3ML) 0.083% IN NEBU
3.0000 mL | INHALATION_SOLUTION | Freq: Four times a day (QID) | RESPIRATORY_TRACT | Status: DC | PRN
  Administered 2018-06-13 – 2018-06-17 (×3): 3 mL via RESPIRATORY_TRACT
  Filled 2018-06-05 (×3): qty 3

## 2018-06-05 MED ORDER — LEVOTHYROXINE SODIUM 100 MCG PO TABS
100.0000 ug | ORAL_TABLET | Freq: Every day | ORAL | Status: DC
  Administered 2018-06-06 – 2018-06-19 (×14): 100 ug via ORAL
  Filled 2018-06-05 (×14): qty 1

## 2018-06-05 MED ORDER — AMLODIPINE BESYLATE 10 MG PO TABS
10.0000 mg | ORAL_TABLET | Freq: Every day | ORAL | Status: DC
  Administered 2018-06-06 – 2018-06-11 (×6): 10 mg via ORAL
  Filled 2018-06-05 (×3): qty 1
  Filled 2018-06-05: qty 2
  Filled 2018-06-05 (×2): qty 1

## 2018-06-05 MED ORDER — PANTOPRAZOLE SODIUM 40 MG PO TBEC
80.0000 mg | DELAYED_RELEASE_TABLET | Freq: Every day | ORAL | Status: DC
  Administered 2018-06-06 – 2018-06-19 (×14): 80 mg via ORAL
  Filled 2018-06-05 (×15): qty 2

## 2018-06-05 MED ORDER — FUROSEMIDE 10 MG/ML IJ SOLN
40.0000 mg | Freq: Once | INTRAMUSCULAR | Status: AC
  Administered 2018-06-05: 40 mg via INTRAVENOUS
  Filled 2018-06-05: qty 4

## 2018-06-05 MED ORDER — HEPARIN (PORCINE) IN NACL 100-0.45 UNIT/ML-% IJ SOLN
1800.0000 [IU]/h | INTRAMUSCULAR | Status: DC
  Administered 2018-06-05: 1200 [IU]/h via INTRAVENOUS
  Administered 2018-06-06: 1600 [IU]/h via INTRAVENOUS
  Administered 2018-06-07: 1900 [IU]/h via INTRAVENOUS
  Administered 2018-06-07: 1700 [IU]/h via INTRAVENOUS
  Administered 2018-06-08 (×2): 1900 [IU]/h via INTRAVENOUS
  Administered 2018-06-09: 1800 [IU]/h via INTRAVENOUS
  Administered 2018-06-09: 1900 [IU]/h via INTRAVENOUS
  Administered 2018-06-10 (×2): 1800 [IU]/h via INTRAVENOUS
  Filled 2018-06-05 (×11): qty 250

## 2018-06-05 MED ORDER — ACYCLOVIR 400 MG PO TABS
400.0000 mg | ORAL_TABLET | Freq: Every day | ORAL | Status: DC
  Administered 2018-06-06 – 2018-06-19 (×14): 400 mg via ORAL
  Filled 2018-06-05 (×15): qty 1

## 2018-06-05 MED ORDER — ASPIRIN EC 325 MG PO TBEC
325.0000 mg | DELAYED_RELEASE_TABLET | Freq: Every day | ORAL | Status: DC
  Filled 2018-06-05: qty 1

## 2018-06-05 MED ORDER — GI COCKTAIL ~~LOC~~: 30.0000 mL | Freq: Four times a day (QID) | ORAL | Status: DC | PRN

## 2018-06-05 MED ORDER — LOSARTAN POTASSIUM 50 MG PO TABS
100.0000 mg | ORAL_TABLET | Freq: Every day | ORAL | Status: DC
  Administered 2018-06-06 – 2018-06-11 (×5): 100 mg via ORAL
  Filled 2018-06-05 (×5): qty 2

## 2018-06-05 MED ORDER — INSULIN GLARGINE 100 UNIT/ML ~~LOC~~ SOLN
15.0000 [IU] | Freq: Once | SUBCUTANEOUS | Status: AC
  Administered 2018-06-05: 15 [IU] via SUBCUTANEOUS
  Filled 2018-06-05: qty 0.15

## 2018-06-05 MED ORDER — NITROGLYCERIN 0.4 MG SL SUBL
0.4000 mg | SUBLINGUAL_TABLET | Freq: Once | SUBLINGUAL | Status: AC
  Administered 2018-06-05: 0.4 mg via SUBLINGUAL
  Filled 2018-06-05: qty 1

## 2018-06-05 MED ORDER — MONTELUKAST SODIUM 10 MG PO TABS
10.0000 mg | ORAL_TABLET | Freq: Every day | ORAL | Status: DC
  Administered 2018-06-05 – 2018-06-18 (×14): 10 mg via ORAL
  Filled 2018-06-05 (×14): qty 1

## 2018-06-05 MED ORDER — INSULIN GLARGINE 100 UNIT/ML ~~LOC~~ SOLN
30.0000 [IU] | Freq: Two times a day (BID) | SUBCUTANEOUS | Status: DC
  Filled 2018-06-05 (×8): qty 0.3

## 2018-06-05 MED ORDER — INSULIN ASPART 100 UNIT/ML ~~LOC~~ SOLN: 20.0000 [IU] | Freq: Three times a day (TID) | SUBCUTANEOUS | Status: DC

## 2018-06-05 MED ORDER — ROSUVASTATIN CALCIUM 20 MG PO TABS
20.0000 mg | ORAL_TABLET | Freq: Every day | ORAL | Status: DC
  Administered 2018-06-05: 20 mg via ORAL
  Filled 2018-06-05: qty 1

## 2018-06-05 MED ORDER — IPRATROPIUM-ALBUTEROL 0.5-2.5 (3) MG/3ML IN SOLN
3.0000 mL | Freq: Four times a day (QID) | RESPIRATORY_TRACT | Status: DC | PRN
  Administered 2018-06-10 – 2018-06-18 (×4): 3 mL via RESPIRATORY_TRACT
  Filled 2018-06-05 (×4): qty 3

## 2018-06-05 MED ORDER — NITROGLYCERIN 2 % TD OINT
0.5000 [in_us] | TOPICAL_OINTMENT | Freq: Four times a day (QID) | TRANSDERMAL | Status: DC
  Administered 2018-06-05 – 2018-06-06 (×4): 0.5 [in_us] via TOPICAL
  Filled 2018-06-05 (×4): qty 1

## 2018-06-05 MED ORDER — HEPARIN BOLUS VIA INFUSION
4000.0000 [IU] | Freq: Once | INTRAVENOUS | Status: AC
  Administered 2018-06-05: 4000 [IU] via INTRAVENOUS

## 2018-06-05 MED ORDER — ACETAMINOPHEN 325 MG PO TABS
650.0000 mg | ORAL_TABLET | ORAL | Status: DC | PRN
  Administered 2018-06-07 – 2018-06-17 (×11): 650 mg via ORAL
  Filled 2018-06-05 (×10): qty 2

## 2018-06-05 MED ORDER — HEPARIN BOLUS VIA INFUSION
3000.0000 [IU] | Freq: Once | INTRAVENOUS | Status: AC
  Administered 2018-06-06: 3000 [IU] via INTRAVENOUS
  Filled 2018-06-05: qty 3000

## 2018-06-05 MED ORDER — MORPHINE SULFATE (PF) 2 MG/ML IV SOLN
2.0000 mg | INTRAVENOUS | Status: DC | PRN
  Administered 2018-06-06 – 2018-06-17 (×6): 2 mg via INTRAVENOUS
  Filled 2018-06-05 (×6): qty 1

## 2018-06-05 MED ORDER — ALPRAZOLAM 0.5 MG PO TABS
0.5000 mg | ORAL_TABLET | Freq: Two times a day (BID) | ORAL | Status: DC | PRN
  Administered 2018-06-05 – 2018-06-18 (×10): 0.5 mg via ORAL
  Filled 2018-06-05 (×10): qty 1

## 2018-06-05 MED ORDER — FUROSEMIDE 20 MG PO TABS
20.0000 mg | ORAL_TABLET | Freq: Every day | ORAL | Status: DC
  Administered 2018-06-07 – 2018-06-13 (×7): 20 mg via ORAL
  Filled 2018-06-05 (×8): qty 1
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
## 2018-06-05 NOTE — ED Notes (Signed)
Pure wick placed.

## 2018-06-05 NOTE — H&P (Addendum)
History and Physical    Mary Raymond FYB:[id number] DOB: 1951-08-16 DOA: 06/05/2018  [name], FNP  Patient coming from: [place] office  I have personally briefly reviewed patient's old medical records in [place]  Chief Complaint: Chest pain and lower extremity edema  HPI: Mary Raymond is a 67 y.o. female with medical history significant of hypertension diabetes hyperlipidemia morbid obesity presents with chest pain.  Patient went to her doctor's office today to get lab work done.  She really cannot remember why she was sent here.  She does states she has had worsening lower extremity edema over the past few days.  Typically they go down when she rests overnight.  She also had some left-sided chest pain today.  There was no radiation.  It was better when she laid down.  At times it felt sharp, and  at times and felt like pressure.  sHe has ongoing dyspnea on exertion for quite a while now.  She is compliant with her medications and already took her morning medications.  Note her father died of a heart attack in his early 63s.  Patient denies any heavy alcohol tobacco or illegal drug use.  Patient was found to have a mildly elevated troponin at 0.11.  Her EKG shows sinus rhythm arrhythmia with no acute ST changes.  Chest x-ray was unremarkable as well.   ED Course: ED physician Dr. Dayna Barker heart consulted cardiology Dr. Harl Bowie who saw patient.  He recommended Lasix and heparin which have been given in the ED.  She also received an aspirin IV metoprolol x1 and nitro sublingual x1.  Is currently chest pain-free.  Review of Systems: Positive for dyspnea on exertion, chest pain, lower extremity edema, dysphasia All others reviewed with patient  and are  negative unless otherwise stated    Past Medical History:  Diagnosis Date  . Allergy   . Anxiety   . Asthma   . Diabetes mellitus without complication ([place])   . Gout   . Hyperlipidemia   . Hypertension   .  Hypothyroid   . Neuropathy   . Obesity   . Vitamin D deficiency     Past Surgical History:  Procedure Laterality Date  . ABDOMINAL HYSTERECTOMY    . BREAST LUMPECTOMY    . rectal fissure       reports that she has never smoked. She has never used smokeless tobacco. She reports that she does not drink alcohol or use drugs.  She lives alone, she does have a daughter nearby  Allergies  Allergen Reactions  . Atorvastatin Other (See Comments)    Leg weakness.    Family History  Problem Relation Age of Onset  . Cancer Mother        colon  . Diabetes Mother   . Hypertension Mother   . Dementia Mother   . Heart disease Father   . Asthma Sister   . Cancer Sister        breast  . Asthma Sister   . Scoliosis Sister   . Diabetes Sister   . Asthma Sister      Prior to Admission medications   Medication Sig Start Date End Date Taking? Authorizing Provider  acyclovir (ZOVIRAX) 400 MG tablet TAKE 1 TABLET EVERY DAY 05/21/18  Yes Hawks, Christy A, FNP  albuterol (PROVENTIL HFA;VENTOLIN HFA) 108 (90 Base) MCG/ACT inhaler Inhale 1-2 puffs into the lungs every 6 (six) hours as needed for wheezing or shortness of breath.  Yes [provider]  ALPRAZolam (XANAX) 0.5 MG tablet TAKE 1 TABLET 2 TIMES A DAY AS NEEDED FOR ANXIETY 02/25/18  Yes Hawks, Christy A, FNP  amLODipine (NORVASC) 10 MG tablet TAKE 1 TABLET EVERY DAY 07/09/17  Yes Hawks, Christy A, FNP  B-D UF III MINI PEN NEEDLES 31G X 5 MM MISC USE AS INSTRUCTED TO GIVE LANTUS INJECTION TWICE DAILY 03/26/18  Yes Hawks, Christy A, FNP  fluticasone (FLONASE) 50 MCG/ACT nasal spray USE 2 SPRAYS IN EACH NOSTRIL EVERY DAY 03/25/18  Yes Hawks, Christy A, FNP  furosemide (LASIX) 20 MG tablet TAKE 1 TABLET (20 MG TOTAL) BY MOUTH DAILY. 01/07/18  Yes Hawks, Christy A, FNP  HYDROcodone-acetaminophen (NORCO) 7.5-325 MG tablet Take 1 tablet by mouth at bedtime. 02/25/18  Yes Hawks, Christy A, FNP  Ipratropium-Albuterol (COMBIVENT RESPIMAT) 20-100  MCG/ACT AERS respimat Inhale 1 puff into the lungs every 6 (six) hours as needed for wheezing. 12/06/17  Yes Evelina Dun A, FNP  LANTUS SOLOSTAR 100 UNIT/ML Solostar Pen INJECT 45 UNITS SUBCUTANEOUSLY 2 TIMES DAILY 05/29/18  Yes Hawks, Christy A, FNP  levothyroxine (SYNTHROID, LEVOTHROID) 50 MCG tablet TAKE 1 TABLET EVERY DAY BEFORE BREAKFAST 05/09/17  Yes Stacks, Cletus Gash, MD  losartan (COZAAR) 100 MG tablet TAKE 1 TABLET EVERY DAY 07/09/17  Yes Hawks, Christy A, FNP  metFORMIN (GLUCOPHAGE) 500 MG tablet TAKE 2 TABLETS TWICE DAILY WITH A MEAL 02/12/18  Yes Hawks, Christy A, FNP  metoprolol succinate (TOPROL-XL) 25 MG 24 hr tablet Take 1 tablet (25 mg total) by mouth daily. 12/03/17  Yes Hawks, Christy A, FNP  montelukast (SINGULAIR) 10 MG tablet TAKE 1 TABLET (10 MG TOTAL) BY MOUTH AT BEDTIME. 03/25/18  Yes Hawks, Christy A, FNP  NOVOLOG FLEXPEN 100 UNIT/ML FlexPen INJECT 45 UNITS SUBCUTANEOUSLY THREE TIMES DAILY WITH MEALS 03/26/18  Yes Evelina Dun A, FNP  omeprazole (PRILOSEC) 40 MG capsule TAKE 1 CAPSULE EVERY DAY 02/19/17  Yes Hawks, Christy A, FNP  rosuvastatin (CRESTOR) 20 MG tablet Take 1 tablet (20 mg total) by mouth at bedtime. 02/27/18 02/27/19 Yes [name], FNP  ACCU-CHEK SOFTCLIX LANCETS lancets Test BG TID 12/02/17   [name], FNP    Physical Exam: Vitals:   06/05/18 1552 06/05/18 1600 06/05/18 1630 06/05/18 1730  BP: (!) 173/71 (!) 175/66 (!) 123/51 (!) 154/76  Pulse: 70 (!) 58 (!) 106   Resp: 15 (!) 25 11 19   Temp:      SpO2: 100% 93% 95%   Weight:      Height:        Constitutional: NAD, calm, comfortable, laying down on a bed Vitals:   06/05/18 1552 06/05/18 1600 06/05/18 1630 06/05/18 1730  BP: (!) 173/71 (!) 175/66 (!) 123/51 (!) 154/76  Pulse: 70 (!) 58 (!) 106   Resp: 15 (!) 25 11 19   Temp:      SpO2: 100% 93% 95%   Weight:      Height:       Eyes: PERRL, lids and conjunctivae normal ENMT: Mucous membranes are moist. Posterior pharynx clear of any  exudate or lesions.Normal dentition.  Neck: normal, supple, no masses,  Respiratory: clear to auscultation bilaterally, no wheezing, no crackles. Normal respiratory effort. No accessory muscle use.  Cardiovascular: Regular rate and rhythm, + blowing  murmur 1+ extremity edema. 1+ pedal pulses.   Abdomen: Mild general tenderness, no masses palpated. No hepatosplenomegaly. Bowel sounds positive.  Morbid obese Musculoskeletal: no clubbing / cyanosis. No joint deformity upper and  lower extremities.  no contractures. .  Skin: no rashes, lesions, ulcers. No induration Neurologic: CN 2-12 grossly intact.  Moves all extremities equally.  Psychiatric: Normal judgment and insight. Alert and oriented x 3. Normal mood.     Labs on Admission: I have personally reviewed following labs and imaging studies  CBC: Recent Labs  Lab 06/05/18 1456  WBC 12.5*  HGB 10.1*  HCT 33.1*  MCV 80.9  PLT 263   Basic Metabolic Panel: Recent Labs  Lab 06/05/18 1456  NA 139  K 4.0  CL 106  CO2 25  GLUCOSE 70  BUN 20  CREATININE 0.97  CALCIUM 9.1   GFR: Estimated Creatinine Clearance: 76.5 mL/min (by C-G formula based on SCr of 0.97 mg/dL). Liver Function Tests: No results for input(s): AST, ALT, ALKPHOS, BILITOT, PROT, ALBUMIN in the last 168 hours. No results for input(s): LIPASE, AMYLASE in the last 168 hours. No results for input(s): AMMONIA in the last 168 hours. Coagulation Profile: No results for input(s): INR, PROTIME in the last 168 hours. Cardiac Enzymes: No results for input(s): CKTOTAL, CKMB, CKMBINDEX, TROPONINI in the last 168 hours. BNP (last 3 results) No results for input(s): PROBNP in the last 8760 hours. HbA1C: No results for input(s): HGBA1C in the last 72 hours. CBG: Recent Labs  Lab 06/05/18 1558 06/05/18 1609  GLUCAP 23* 123*   Lipid Profile: No results for input(s): CHOL, HDL, LDLCALC, TRIG, CHOLHDL, LDLDIRECT in the last 72 hours. Thyroid Function Tests: Recent Labs     06/05/18 1505  TSH 1.822   Anemia Panel: No results for input(s): VITAMINB12, FOLATE, FERRITIN, TIBC, IRON, RETICCTPCT in the last 72 hours. Urine analysis:    Component Value Date/Time   COLORURINE YELLOW 09/12/2017 1047   APPEARANCEUR Clear 02/25/2018 1050   LABSPEC 1.017 09/12/2017 1047   PHURINE 5.0 09/12/2017 1047   GLUCOSEU Negative 02/25/2018 1050   HGBUR SMALL (A) 09/12/2017 1047   BILIRUBINUR Negative 02/25/2018 [place] 09/12/2017 1047   PROTEINUR 3+ (A) 02/25/2018 1050   PROTEINUR >=300 (A) 09/12/2017 1047   UROBILINOGEN negative 11/14/2015 0949   NITRITE Negative 02/25/2018 1050   NITRITE NEGATIVE 09/12/2017 1047   LEUKOCYTESUR Negative 02/25/2018 1050    Radiological Exams on Admission: Dg Chest 2 View  Result Date: 06/05/2018 CLINICAL DATA:  Chest pain EXAM: CHEST - 2 VIEW COMPARISON:  12/01/2014 FINDINGS: There is no focal parenchymal opacity. There is no pleural effusion or pneumothorax. There is stable cardiomegaly. There is thoracic aortic atherosclerosis. The osseous structures are unremarkable. IMPRESSION: No active cardiopulmonary disease. Electronically Signed   By: Kathreen Devoid   On: 06/05/2018 15:50    EKG: Independently reviewed.  Sinus a arrhythmia poor R wave progression nonspecific ST changes versus early repolarization changes abnormal EKG  Assessment/Plan Principal Problem:   Chest pain Active Problems:   Hypothyroidism   Hypertension associated with diabetes ([place])   Hyperlipidemia associated with type 2 diabetes mellitus (HCC)   Obesity, morbid, BMI 40.0-49.9 (HCC)   Elevated troponin   -Admit to telemetry for observation, cycle cardiac enzymes, aspirin, statin, Nitropaste, heparin drip per cardiology recommendation , beta-blocker as tolerated, check fasting lipid panel, cardiology consulted , check  echocardiogram, note she has had some ongoing dysphagia to liquids and solids for quite a while.  She states her last EGD  was about 10 years ago.  Continue  Protonix.  Consider GI consultation if continued symptoms  -Carb consistent diet, sliding scale insulin , check hemoglobin A1c, his  blood sugar was 23 here in the ED.  She reports some hypoglycemic unawareness.  Therefore will adjust her home insulin regimen down to 20 units 3 times daily of NovoLog with meals and down to 30 units twice a day of her long-acting insulin -Continue home medications for chronic hypertension hypothyroidism anxiety   DVT prophylaxis: On full dose heparin/SCDs  Code Status: Full Disposition Plan: Home 1 to 2 days  Consults called: Dr. Harl Bowie cardiology  Admission status: Inpatient telemetry   Sheronda Parran Johnson-Pitts MD [facility] Pager [phone]  If 7PM-7AM, please contact night-coverage [email or web address] Password [facility]  06/05/2018, 6:25 PM

## 2018-06-05 NOTE — Progress Notes (Addendum)
Subjective:    Patient ID: Mary Raymond, female    DOB: 12-Jul-1951, 67 y.o.   MRN: [id number]  Chief Complaint  Patient presents with  . Medical Management of Chronic Issues    pt here today for 3 month follow up and medication refills and also c/o sore throat which she believes is due to thrush because of inhaler use   Pt presents to the office today for chronic follow up.Pt is noncompliant and hasnot followed up with ophthalmology,  Cardiology, or Ortho as instructed due to financial concerns.  Pt states she is having intermittent chest pain when walking, but improves when she rests. She is complaining of SOB today.  Diabetes  She presents for her follow-up diabetic visit. She has type 2 diabetes mellitus. Her disease course has been stable. Hypoglycemia symptoms include nervousness/anxiousness. Associated symptoms include blurred vision, chest pain, fatigue and foot paresthesias. There are no hypoglycemic complications. Symptoms are worsening. There are no diabetic complications. Pertinent negatives for diabetic complications include no CVA or heart disease. Risk factors for coronary artery disease include dyslipidemia, diabetes mellitus, hypertension, sedentary lifestyle and post-menopausal. Her overall blood glucose range is 110-130 mg/dl. Eye exam is not current.  Hypertension  This is a chronic problem. The current episode started more than 1 year ago. The problem has been waxing and waning since onset. The problem is uncontrolled. Associated symptoms include anxiety, blurred vision, chest pain, malaise/fatigue, peripheral edema and shortness of breath. Risk factors for coronary artery disease include dyslipidemia, diabetes mellitus, obesity and sedentary lifestyle. The current treatment provides no improvement. There is no history of CVA. Identifiable causes of hypertension include a thyroid problem.  Hyperlipidemia  This is a chronic problem. The current episode started more than  1 year ago. The problem is uncontrolled. Recent lipid tests were reviewed and are high. Exacerbating diseases include obesity. Associated symptoms include chest pain and shortness of breath. Pertinent negatives include no leg pain. She is currently on no antihyperlipidemic treatment. The current treatment provides no improvement of lipids. Risk factors for coronary artery disease include dyslipidemia, diabetes mellitus, hypertension, a sedentary lifestyle and post-menopausal.  Anxiety  Presents for follow-up visit. Symptoms include chest pain, depressed mood, excessive worry, irritability, nervous/anxious behavior and shortness of breath. Symptoms occur occasionally. The severity of symptoms is moderate.    Depression         This is a chronic problem.  The current episode started more than 1 year ago.   The onset quality is gradual.   The problem occurs intermittently.  The problem has been waxing and waning since onset.  Associated symptoms include fatigue, irritable and sad.  Associated symptoms include no helplessness, no hopelessness and no appetite change.  Past treatments include SSRIs - Selective serotonin reuptake inhibitors.  Past medical history includes thyroid problem and anxiety.   Back Pain  This is a chronic problem. The current episode started more than 1 year ago. The problem occurs constantly. The problem has been waxing and waning since onset. The pain is present in the lumbar spine. The quality of the pain is described as aching. The pain is at a severity of 10/10. The pain is moderate. Associated symptoms include chest pain. Pertinent negatives include no bladder incontinence, bowel incontinence, leg pain, numbness or pelvic pain. She has tried analgesics and bed rest for the symptoms. The treatment provided mild relief.  Thyroid Problem  Presents for follow-up visit. Symptoms include anxiety, depressed mood and fatigue. The symptoms have been  stable. Her past medical history is  significant for hyperlipidemia.  Chest Pain   This is a new problem. The current episode started 1 to 4 weeks ago. The onset quality is gradual. The problem occurs intermittently. The problem has been waxing and waning. The pain is at a severity of 6/10. The pain is moderate. The quality of the pain is described as sharp. The pain does not radiate. Associated symptoms include back pain, a cough, malaise/fatigue and shortness of breath. Pertinent negatives include no leg pain or numbness.  Her past medical history is significant for hyperlipidemia, hypertension and thyroid problem.  Pertinent negatives for past medical history include no heart disease.      Review of Systems  Constitutional: Positive for fatigue, irritability and malaise/fatigue. Negative for appetite change.  Eyes: Positive for blurred vision.  Respiratory: Positive for cough and shortness of breath.   Cardiovascular: Positive for chest pain.  Gastrointestinal: Negative for bowel incontinence.  Genitourinary: Negative for bladder incontinence and pelvic pain.  Musculoskeletal: Positive for back pain.  Neurological: Negative for numbness.  Psychiatric/Behavioral: Positive for depression. The patient is nervous/anxious.   All other systems reviewed and are negative.  Family History  Problem Relation Age of Onset  . Cancer Mother        colon  . Diabetes Mother   . Hypertension Mother   . Dementia Mother   . Heart disease Father   . Asthma Sister   . Cancer Sister        breast  . Asthma Sister   . Scoliosis Sister   . Diabetes Sister   . Asthma Sister    Social History   Socioeconomic History  . Marital status: Divorced    Spouse name: Not on file  . Number of children: Not on file  . Years of education: Not on file  . Highest education level: Not on file  Occupational History  . Not on file  Social Needs  . Financial resource strain: Not on file  . Food insecurity:    Worry: Not on file    Inability:  Not on file  . Transportation needs:    Medical: Not on file    Non-medical: Not on file  Tobacco Use  . Smoking status: Never Smoker  . Smokeless tobacco: Never Used  Substance and Sexual Activity  . Alcohol use: No  . Drug use: No  . Sexual activity: Not on file  Lifestyle  . Physical activity:    Days per week: Not on file    Minutes per session: Not on file  . Stress: Not on file  Relationships  . Social connections:    Talks on phone: Not on file    Gets together: Not on file    Attends religious service: Not on file    Active member of club or organization: Not on file    Attends meetings of clubs or organizations: Not on file    Relationship status: Not on file  Other Topics Concern  . Not on file  Social History Narrative  . Not on file       Objective:   Physical Exam  Constitutional: She is oriented to person, place, and time. She appears well-developed and well-nourished. She is irritable. No distress.  Strong urine smell  HENT:  Head: Normocephalic and atraumatic.  Right Ear: External ear normal.  Left Ear: External ear normal.  Mouth/Throat: Oropharynx is clear and moist.  Eyes: Pupils are equal, round, and reactive to  light.  Neck: Normal range of motion. Neck supple. No thyromegaly present.  Cardiovascular: Normal rate and intact distal pulses. An irregular rhythm present.  Murmur heard. Pulmonary/Chest: Effort normal and breath sounds normal. No respiratory distress. She has no wheezes.  SOB with exertion  Abdominal: Soft. Bowel sounds are normal. She exhibits no distension. There is no tenderness.  Musculoskeletal: She exhibits edema (3+ BLE) and tenderness.  Low back pain with flexion or extension, generalized weakness  Neurological: She is alert and oriented to person, place, and time. She has normal reflexes. No cranial nerve deficit.  Skin: Skin is warm and dry.  Psychiatric: She has a normal mood and affect. Her behavior is normal. Judgment and  thought content normal.  Vitals reviewed.   EKG- A fib  BP (!) 167/67   Pulse 70   Temp (!) 97 F (36.1 C) (Oral)   Ht 5' 5" (1.651 m)   Wt 279 lb 4 oz (126.7 kg)   BMI 46.47 kg/m      Assessment & Plan:  Mary Raymond comes in today with chief complaint of Medical Management of Chronic Issues (pt here today for 3 month follow up and medication refills and also c/o sore throat which she believes is due to thrush because of inhaler use)   Diagnosis and orders addressed:  1. Anxiety - CMP14+EGFR  2. Uncomplicated asthma, unspecified asthma severity, unspecified whether persistent - CMP14+EGFR  3. Chronic bilateral low back pain with bilateral sciatica - CMP14+EGFR  4. Diabetes mellitus type 2, uncontrolled, without complications (HCC) - Bayer DCA Hb A1c Waived - CMP14+EGFR  5. Hyperlipidemia associated with type 2 diabetes mellitus (HCC) - CMP14+EGFR  6. Hypertension associated with diabetes ([place]) - CMP14+EGFR  7. Hypothyroidism, unspecified type - CMP14+EGFR  8. Noncompliance - CMP14+EGFR  9. Pain medication agreement signed - CMP14+EGFR  10. Uncomplicated opioid dependence ([place]) - CMP14+EGFR  11. Obesity, morbid, BMI 40.0-49.9 ([place]) - CMP14+EGFR  12. SOB (shortness of breath) - EKG 12-Lead - CMP14+EGFR  13. Chest pain, exertional - EKG 12-Lead - CMP14+EGFR  14. Atrial fibrillation, unspecified type ([place]) -New onset A Fib - CMP14+EGFR   Labs pending Health Maintenance reviewed Diet and exercise encouraged  Follow up plan: Tried to get pt into A fib clinic, no spot available. Was going to call EMS to transport to ED. PT states she has to go home and let her dog out and "hide her medications from her son". Long discussion with patient of risks, she is aware and states she will get her son or sister to take her to ED. Pt is noncompliant. I have sent in several referrals to specialists and she has never follows up. Worrisome that she will not  go to ED, but she is aware of risks and refuses transport by EMS.    Evelina Dun, FNP

## 2018-06-05 NOTE — Consult Note (Signed)
Cardiology Consultation:   Patient ID: Mary Raymond MRN: [id number]; DOB: 06-Jul-1951  Admit date: 06/05/2018 Date of Consult: 06/05/2018  Primary Care Provider: [name], FNP Primary Cardiologist: n/a Primary Electrophysiologist:  n/a   Patient Profile:   Mary Raymond is a 67 y.o. female with a hx of DM2, HTN, HL who is being seen today for the evaluation of chest pain at the request of Dr Dolly Rias  History of Present Illness:   Mary Raymond 67 yo female history of DM2, HTN, HL presents with chest pain  She reports symptoms going on over 1 month. Chest pain under left breast. Can be variable in character. At time sharp, dull, or pressure. 5/10 in severity. +SOB, nausea. Worst with sitting up, better with laying flat. Can last several hours at a time. Occurs mainyl at rest. Stable since onset over 1 month ago, she came to the ER today because reported symptoms to her pcp. Also reports worsening LE edema, DOE at home. Some orthopnea.     K 4, Cr 0.97, BUN 20, WBC 12.5, Hgb 10.1, Plt 257, 197,  istat trop 0.11 CXR no acute process  EKG SR, anteroseptal Qwaves, borderline lateral ST depressions    Past Medical History:  Diagnosis Date  . Allergy   . Anxiety   . Asthma   . Diabetes mellitus without complication ([place])   . Gout   . Hyperlipidemia   . Hypertension   . Hypothyroid   . Neuropathy   . Obesity   . Vitamin D deficiency     Past Surgical History:  Procedure Laterality Date  . ABDOMINAL HYSTERECTOMY    . BREAST LUMPECTOMY    . rectal fissure       Inpatient Medications: Scheduled Meds: . heparin  4,000 Units Intravenous Once   Continuous Infusions: . heparin     PRN Meds:   Allergies:    Allergies  Allergen Reactions  . Atorvastatin Other (See Comments)    Leg weakness.    Social History:   Social History   Socioeconomic History  . Marital status: Divorced    Spouse name: Not on file  . Number of children: Not on file  .  Years of education: Not on file  . Highest education level: Not on file  Occupational History  . Not on file  Social Needs  . Financial resource strain: Not on file  . Food insecurity:    Worry: Not on file    Inability: Not on file  . Transportation needs:    Medical: Not on file    Non-medical: Not on file  Tobacco Use  . Smoking status: Never Smoker  . Smokeless tobacco: Never Used  Substance and Sexual Activity  . Alcohol use: No  . Drug use: No  . Sexual activity: Not on file  Lifestyle  . Physical activity:    Days per week: Not on file    Minutes per session: Not on file  . Stress: Not on file  Relationships  . Social connections:    Talks on phone: Not on file    Gets together: Not on file    Attends religious service: Not on file    Active member of club or organization: Not on file    Attends meetings of clubs or organizations: Not on file    Relationship status: Not on file  . Intimate partner violence:    Fear of current or ex partner: Not on file    Emotionally  abused: Not on file    Physically abused: Not on file    Forced sexual activity: Not on file  Other Topics Concern  . Not on file  Social History Narrative  . Not on file    Family History:    Family History  Problem Relation Age of Onset  . Cancer Mother        colon  . Diabetes Mother   . Hypertension Mother   . Dementia Mother   . Heart disease Father   . Asthma Sister   . Cancer Sister        breast  . Asthma Sister   . Scoliosis Sister   . Diabetes Sister   . Asthma Sister      ROS:  Please see the history of present illness.  All other ROS reviewed and negative.     Physical Exam/Data:   Vitals:   06/05/18 1452 06/05/18 1454 06/05/18 1552  BP: (!) 175/68  (!) 173/71  Pulse: 80  70  Resp: 16  15  Temp: 98 F (36.7 C)    SpO2: 99%  100%  Weight:  126.7 kg   Height:  5\' 5"  (1.651 m)    No intake or output data in the 24 hours ending 06/05/18 1621 Filed Weights    06/05/18 1454  Weight: 126.7 kg   Body mass index is 46.47 kg/m.  General:  Well nourished, well developed, in no acute distress HEENT: normal Lymph: no adenopathy Neck: no JVD Endocrine:  No thryomegaly Cardiac:  normal S1, S2; 3/6 systolic murmur rusb; no murmur Lungs:  clear to auscultation bilaterally, no wheezing, rhonchi or rales  Abd: soft, nontender, no hepatomegaly  Ext: 2+ bilateral LE edema.  Musculoskeletal:  No deformities, BUE and BLE strength normal and equal Skin: warm and dry  Neuro:  CNs 2-12 intact, no focal abnormalities noted Psych:  Normal affect    Laboratory Data:  Chemistry Recent Labs  Lab 06/05/18 1456  NA 139  K 4.0  CL 106  CO2 25  GLUCOSE 70  BUN 20  CREATININE 0.97  CALCIUM 9.1  GFRNONAA 60*  GFRAA >60  ANIONGAP 8    No results for input(s): PROT, ALBUMIN, AST, ALT, ALKPHOS, BILITOT in the last 168 hours. Hematology Recent Labs  Lab 06/05/18 1456  WBC 12.5*  RBC 4.09  HGB 10.1*  HCT 33.1*  MCV 80.9  MCH 24.7*  MCHC 30.5  RDW 16.5*  PLT 257   Cardiac EnzymesNo results for input(s): TROPONINI in the last 168 hours.  Recent Labs  Lab 06/05/18 1505  TROPIPOC 0.11*    BNP Recent Labs  Lab 06/05/18 1526  BNP 197.0*    DDimer No results for input(s): DDIMER in the last 168 hours.  Radiology/Studies:  Dg Chest 2 View  Result Date: 06/05/2018 CLINICAL DATA:  Chest pain EXAM: CHEST - 2 VIEW COMPARISON:  12/01/2014 FINDINGS: There is no focal parenchymal opacity. There is no pleural effusion or pneumothorax. There is stable cardiomegaly. There is thoracic aortic atherosclerosis. The osseous structures are unremarkable. IMPRESSION: No active cardiopulmonary disease. Electronically Signed   By: Kathreen Devoid   On: 06/05/2018 15:50    Assessment and Plan:   1. Chest pain/Elevated troponin - patient with multiple CAD risk factors. Mild trop 0.11 thus far, EKG nonspecific changes - symptoms are fairly atypical for ischemia.  Ongoing over 1 month, can last hours, can be worst with position - cycle enzymes and EKG overnight, obtain echo. Pending  initial workup will be able to determine possibel ischemic workup - evidence of fluid overload, could be playing some role in symptoms and her mild trop. F/u echo, will dose IV lasix 40mg  x 1 in ER, reassess redosing tomorrow - keep npo at midnight. - agree with hep gtt at this time. Continue ASA, she has statin allergy, continue ARB, hold beta blocker due to bradycardia.    2. Bradycardia - sinus brady with [place] block on telemetry.  - hold metoprolol, check TSH    For questions or updates, please contact [place] Please consult [email or web address] for contact info under     Signed, Carlyle Dolly, MD  06/05/2018 4:21 PM

## 2018-06-05 NOTE — ED Triage Notes (Signed)
Patient reports chest pain that has been intermittent for several months. SOB with any exertion. Patient reports pain in her L chest, neck and into her back. Sent by PCP. Refused EMS transport.

## 2018-06-05 NOTE — Progress Notes (Signed)
ANTICOAGULATION CONSULT NOTE - Follow Up Consult  Pharmacy Consult for heparin Indication: chest pain/ACS  Labs: Recent Labs    06/05/18 1456 06/05/18 1501 06/05/18 1947 06/05/18 2304  HGB 10.1*  --   --   --   HCT 33.1*  --   --   --   PLT 257  --   --   --   APTT  --  32  --   --   HEPARINUNFRC  --   --   --  <0.10*  CREATININE 0.97  --   --   --   TROPONINI  --   --  0.25*  --     Assessment: 67yo female undetectable on heparin with initial dosing for CP.  Goal of Therapy:  Heparin level 0.3-0.7 units/ml   Plan:  Will rebolus with heparin 3000 units and increase heparin gtt by 4 units/kg/hr to 1600 units/hr and check level in 6 hours.    Wynona Neat, PharmD, BCPS  06/05/2018,11:41 PM

## 2018-06-05 NOTE — ED Notes (Signed)
Date and time results received: 06/05/18 1520 (use smartphrase ".now" to insert current time)  Test: troponin Critical Value: 0.11  Name of Provider Notified: Dr. Eulis Foster  Orders Received? Or Actions Taken?: none at this time

## 2018-06-05 NOTE — Progress Notes (Signed)
ANTICOAGULATION CONSULT NOTE - Initial Consult  Pharmacy Consult for heparin Indication: ACS/STEMI  Allergies  Allergen Reactions  . Atorvastatin Other (See Comments)    Leg weakness.    Patient Measurements: Height: 5\' 5"  (165.1 cm) Weight: 279 lb 4 oz (126.7 kg) IBW/kg (Calculated) : 57 Heparin Dosing Weight: 88 kg  Vital Signs: Temp: 98 F (36.7 C) (09/19 1452) Temp Source: Oral (09/19 0922) BP: 173/71 (09/19 1552) Pulse Rate: 70 (09/19 1552)  Labs: Recent Labs    06/05/18 1456  HGB 10.1*  HCT 33.1*  PLT 257  CREATININE 0.97    Estimated Creatinine Clearance: 76.5 mL/min (by C-G formula based on SCr of 0.97 mg/dL).   Medical History: Past Medical History:  Diagnosis Date  . Allergy   . Anxiety   . Asthma   . Diabetes mellitus without complication ([place])   . Gout   . Hyperlipidemia   . Hypertension   . Hypothyroid   . Neuropathy   . Obesity   . Vitamin D deficiency     Medications:   (Not in a hospital admission)  Assessment: Pharmacy consulted to dose heparin in patient for ACS/STEMI.  Patient's troponin on admission is 0.11.  Goal of Therapy:  Heparin level 0.3-0.7 units/ml Monitor platelets by anticoagulation protocol: Yes   Plan:  Give 4000 units bolus x 1 Start heparin infusion at 1200 units/hr Check anti-Xa level in 6 hours and daily while on heparin Continue to monitor H&H and platelets  Ramond Craver 06/05/2018,4:16 PM

## 2018-06-05 NOTE — Patient Instructions (Signed)
Atrial Fibrillation Atrial fibrillation is a type of irregular or rapid heartbeat (arrhythmia). In atrial fibrillation, the heart quivers continuously in a chaotic pattern. This occurs when parts of the heart receive disorganized signals that make the heart unable to pump blood normally. This can increase the risk for stroke, heart failure, and other heart-related conditions. There are different types of atrial fibrillation, including:  Paroxysmal atrial fibrillation. This type starts suddenly, and it usually stops on its own shortly after it starts.  Persistent atrial fibrillation. This type often lasts longer than a week. It may stop on its own or with treatment.  Long-lasting persistent atrial fibrillation. This type lasts longer than 12 months.  Permanent atrial fibrillation. This type does not go away.  Talk with your health care provider to learn about the type of atrial fibrillation that you have. What are the causes? This condition is caused by some heart-related conditions or procedures, including:  A heart attack.  Coronary artery disease.  Heart failure.  Heart valve conditions.  High blood pressure.  Inflammation of the sac that surrounds the heart (pericarditis).  Heart surgery.  Certain heart rhythm disorders, such as Wolf-Parkinson-White syndrome.  Other causes include:  Pneumonia.  Obstructive sleep apnea.  Blockage of an artery in the lungs (pulmonary embolism, or PE).  Lung cancer.  Chronic lung disease.  Thyroid problems, especially if the thyroid is overactive (hyperthyroidism).  Caffeine.  Excessive alcohol use or illegal drug use.  Use of some medicines, including certain decongestants and diet pills.  Sometimes, the cause cannot be found. What increases the risk? This condition is more likely to develop in:  People who are older in age.  People who smoke.  People who have diabetes mellitus.  People who are overweight  (obese).  Athletes who exercise vigorously.  What are the signs or symptoms? Symptoms of this condition include:  A feeling that your heart is beating rapidly or irregularly.  A feeling of discomfort or pain in your chest.  Shortness of breath.  Sudden light-headedness or weakness.  Getting tired easily during exercise.  In some cases, there are no symptoms. How is this diagnosed? Your health care provider may be able to detect atrial fibrillation when taking your pulse. If detected, this condition may be diagnosed with:  An electrocardiogram (ECG).  A Holter monitor test that records your heartbeat patterns over a 24-hour period.  Transthoracic echocardiogram (TTE) to evaluate how blood flows through your heart.  Transesophageal echocardiogram (TEE) to view more detailed images of your heart.  A stress test.  Imaging tests, such as a CT scan or chest X-ray.  Blood tests.  How is this treated? The main goals of treatment are to prevent blood clots from forming and to keep your heart beating at a normal rate and rhythm. The type of treatment that you receive depends on many factors, such as your underlying medical conditions and how you feel when you are experiencing atrial fibrillation. This condition may be treated with:  Medicine to slow down the heart rate, bring the heart's rhythm back to normal, or prevent clots from forming.  Electrical cardioversion. This is a procedure that resets your heart's rhythm by delivering a controlled, low-energy shock to the heart through your skin.  Different types of ablation, such as catheter ablation, catheter ablation with pacemaker, or surgical ablation. These procedures destroy the heart tissues that send abnormal signals. When the pacemaker is used, it is placed under your skin to help your heart beat in   a regular rhythm.  Follow these instructions at home:  Take over-the counter and prescription medicines only as told by your  health care provider.  If your health care provider prescribed a blood-thinning medicine (anticoagulant), take it exactly as told. Taking too much blood-thinning medicine can cause bleeding. If you do not take enough blood-thinning medicine, you will not have the protection that you need against stroke and other problems.  Do not use tobacco products, including cigarettes, chewing tobacco, and e-cigarettes. If you need help quitting, ask your health care provider.  If you have obstructive sleep apnea, manage your condition as told by your health care provider.  Do not drink alcohol.  Do not drink beverages that contain caffeine, such as coffee, soda, and tea.  Maintain a healthy weight. Do not use diet pills unless your health care provider approves. Diet pills may make heart problems worse.  Follow diet instructions as told by your health care provider.  Exercise regularly as told by your health care provider.  Keep all follow-up visits as told by your health care provider. This is important. How is this prevented?  Avoid drinking beverages that contain caffeine or alcohol.  Avoid certain medicines, especially medicines that are used for breathing problems.  Avoid certain herbs and herbal medicines, such as those that contain ephedra or ginseng.  Do not use illegal drugs, such as cocaine and amphetamines.  Do not smoke.  Manage your high blood pressure. Contact a health care provider if:  You notice a change in the rate, rhythm, or strength of your heartbeat.  You are taking an anticoagulant and you notice increased bruising.  You tire more easily when you exercise or exert yourself. Get help right away if:  You have chest pain, abdominal pain, sweating, or weakness.  You feel nauseous.  You notice blood in your vomit, bowel movement, or urine.  You have shortness of breath.  You suddenly have swollen feet and ankles.  You feel dizzy.  You have sudden weakness or  numbness of the face, arm, or leg, especially on one side of the body.  You have trouble speaking, trouble understanding, or both (aphasia).  Your face or your eyelid droops on one side. These symptoms may represent a serious problem that is an emergency. Do not wait to see if the symptoms will go away. Get medical help right away. Call your local emergency services (911 in the [place] Do not drive yourself to the hospital. This information is not intended to replace advice given to you by your health care provider. Make sure you discuss any questions you have with your health care provider. Document Released: 09/03/2005 Document Revised: 01/11/2016 Document Reviewed: 12/29/2014 Elsevier Interactive Patient Education  2018 [employer] Inc[employer]  

## 2018-06-06 ENCOUNTER — Encounter (HOSPITAL_COMMUNITY): Payer: Self-pay | Admitting: Cardiovascular Disease

## 2018-06-06 ENCOUNTER — Encounter (HOSPITAL_COMMUNITY)
Admission: EM | Disposition: A | Discharge status: Skilled Nursing Facility | Payer: Self-pay | Source: Home / Self Care | Attending: Internal Medicine

## 2018-06-06 ENCOUNTER — Inpatient Hospital Stay (HOSPITAL_COMMUNITY): Payer: Medicare HMO

## 2018-06-06 ENCOUNTER — Observation Stay (HOSPITAL_COMMUNITY): Payer: Medicare HMO

## 2018-06-06 DIAGNOSIS — I63432 Cerebral infarction due to embolism of left posterior cerebral artery: Secondary | ICD-10-CM | POA: Diagnosis not present

## 2018-06-06 DIAGNOSIS — F0390 Unspecified dementia without behavioral disturbance: Secondary | ICD-10-CM | POA: Diagnosis present

## 2018-06-06 DIAGNOSIS — R0789 Other chest pain: Secondary | ICD-10-CM | POA: Diagnosis present

## 2018-06-06 DIAGNOSIS — I2510 Atherosclerotic heart disease of native coronary artery without angina pectoris: Secondary | ICD-10-CM

## 2018-06-06 DIAGNOSIS — E119 Type 2 diabetes mellitus without complications: Secondary | ICD-10-CM

## 2018-06-06 DIAGNOSIS — E1159 Type 2 diabetes mellitus with other circulatory complications: Secondary | ICD-10-CM | POA: Diagnosis not present

## 2018-06-06 DIAGNOSIS — R7989 Other specified abnormal findings of blood chemistry: Secondary | ICD-10-CM | POA: Diagnosis not present

## 2018-06-06 DIAGNOSIS — I340 Nonrheumatic mitral (valve) insufficiency: Secondary | ICD-10-CM | POA: Diagnosis not present

## 2018-06-06 DIAGNOSIS — D638 Anemia in other chronic diseases classified elsewhere: Secondary | ICD-10-CM | POA: Diagnosis present

## 2018-06-06 DIAGNOSIS — E785 Hyperlipidemia, unspecified: Secondary | ICD-10-CM | POA: Diagnosis present

## 2018-06-06 DIAGNOSIS — E1169 Type 2 diabetes mellitus with other specified complication: Secondary | ICD-10-CM | POA: Diagnosis present

## 2018-06-06 DIAGNOSIS — I440 Atrioventricular block, first degree: Secondary | ICD-10-CM

## 2018-06-06 DIAGNOSIS — Z01810 Encounter for preprocedural cardiovascular examination: Secondary | ICD-10-CM | POA: Diagnosis not present

## 2018-06-06 DIAGNOSIS — I441 Atrioventricular block, second degree: Secondary | ICD-10-CM | POA: Diagnosis present

## 2018-06-06 DIAGNOSIS — R079 Chest pain, unspecified: Secondary | ICD-10-CM | POA: Diagnosis not present

## 2018-06-06 DIAGNOSIS — N179 Acute kidney failure, unspecified: Secondary | ICD-10-CM | POA: Diagnosis not present

## 2018-06-06 DIAGNOSIS — E11649 Type 2 diabetes mellitus with hypoglycemia without coma: Secondary | ICD-10-CM | POA: Diagnosis present

## 2018-06-06 DIAGNOSIS — I208 Other forms of angina pectoris: Secondary | ICD-10-CM | POA: Diagnosis not present

## 2018-06-06 DIAGNOSIS — I25110 Atherosclerotic heart disease of native coronary artery with unstable angina pectoris: Secondary | ICD-10-CM | POA: Diagnosis not present

## 2018-06-06 DIAGNOSIS — R748 Abnormal levels of other serum enzymes: Secondary | ICD-10-CM | POA: Diagnosis not present

## 2018-06-06 DIAGNOSIS — E8770 Fluid overload, unspecified: Secondary | ICD-10-CM | POA: Diagnosis present

## 2018-06-06 DIAGNOSIS — I700 Atherosclerosis of aorta: Secondary | ICD-10-CM | POA: Diagnosis present

## 2018-06-06 DIAGNOSIS — I10 Essential (primary) hypertension: Secondary | ICD-10-CM | POA: Diagnosis not present

## 2018-06-06 DIAGNOSIS — Z6841 Body Mass Index (BMI) 40.0 and over, adult: Secondary | ICD-10-CM | POA: Diagnosis not present

## 2018-06-06 DIAGNOSIS — I9789 Other postprocedural complications and disorders of the circulatory system, not elsewhere classified: Secondary | ICD-10-CM | POA: Diagnosis not present

## 2018-06-06 DIAGNOSIS — Z794 Long term (current) use of insulin: Secondary | ICD-10-CM | POA: Diagnosis not present

## 2018-06-06 DIAGNOSIS — E1165 Type 2 diabetes mellitus with hyperglycemia: Secondary | ICD-10-CM | POA: Diagnosis present

## 2018-06-06 DIAGNOSIS — I214 Non-ST elevation (NSTEMI) myocardial infarction: Secondary | ICD-10-CM | POA: Diagnosis present

## 2018-06-06 DIAGNOSIS — I97821 Postprocedural cerebrovascular infarction during other surgery: Secondary | ICD-10-CM | POA: Diagnosis not present

## 2018-06-06 DIAGNOSIS — F419 Anxiety disorder, unspecified: Secondary | ICD-10-CM | POA: Diagnosis present

## 2018-06-06 DIAGNOSIS — H5461 Unqualified visual loss, right eye, normal vision left eye: Secondary | ICD-10-CM | POA: Diagnosis present

## 2018-06-06 DIAGNOSIS — H539 Unspecified visual disturbance: Secondary | ICD-10-CM | POA: Diagnosis not present

## 2018-06-06 DIAGNOSIS — G8929 Other chronic pain: Secondary | ICD-10-CM | POA: Diagnosis present

## 2018-06-06 DIAGNOSIS — H5340 Unspecified visual field defects: Secondary | ICD-10-CM | POA: Diagnosis present

## 2018-06-06 DIAGNOSIS — I152 Hypertension secondary to endocrine disorders: Secondary | ICD-10-CM | POA: Diagnosis present

## 2018-06-06 DIAGNOSIS — E1122 Type 2 diabetes mellitus with diabetic chronic kidney disease: Secondary | ICD-10-CM | POA: Diagnosis present

## 2018-06-06 DIAGNOSIS — E039 Hypothyroidism, unspecified: Secondary | ICD-10-CM | POA: Diagnosis present

## 2018-06-06 HISTORY — PX: LEFT HEART CATH AND CORONARY ANGIOGRAPHY: CATH118249

## 2018-06-06 LAB — GLUCOSE, CAPILLARY
GLUCOSE-CAPILLARY: 23 mg/dL — AB (ref 70–99)
GLUCOSE-CAPILLARY: 95 mg/dL (ref 70–99)
GLUCOSE-CAPILLARY: 110 mg/dL — AB (ref 70–99)
Glucose-Capillary: 102 mg/dL — ABNORMAL HIGH (ref 70–99)
Glucose-Capillary: 97 mg/dL (ref 70–99)
Glucose-Capillary: 106 mg/dL — ABNORMAL HIGH (ref 70–99)
Glucose-Capillary: 116 mg/dL — ABNORMAL HIGH (ref 70–99)

## 2018-06-06 LAB — VITAMIN B12: Vitamin B-12: 235 pg/mL (ref 180–914)

## 2018-06-06 LAB — TROPONIN I
TROPONIN I: 0.34 ng/mL — AB (ref <0.03)
TROPONIN I: 0.35 ng/mL — AB (ref <0.03)

## 2018-06-06 LAB — MRSA PCR SCREENING: MRSA BY PCR: NEGATIVE

## 2018-06-06 LAB — IRON AND TIBC
IRON: 27 ug/dL — AB (ref 28–170)
Saturation Ratios: 7 % — ABNORMAL LOW (ref 10.4–31.8)
TIBC: 364 ug/dL (ref 250–450)
UIBC: 337 ug/dL

## 2018-06-06 LAB — ECHOCARDIOGRAM COMPLETE
HEIGHTINCHES: 65 in
Weight: 4430.36 oz

## 2018-06-06 LAB — CBC
HEMATOCRIT: 31.5 % — AB (ref 36.0–46.0)
HEMOGLOBIN: 9.3 g/dL — AB (ref 12.0–15.0)
MCH: 24 pg — AB (ref 26.0–34.0)
MCHC: 29.5 g/dL — ABNORMAL LOW (ref 30.0–36.0)
MCV: 81.4 fL (ref 78.0–100.0)
PLATELETS: 239 10*3/uL (ref 150–400)
RBC: 3.87 MIL/uL (ref 3.87–5.11)
RDW: 16.7 % — ABNORMAL HIGH (ref 11.5–15.5)
WBC: 10.7 10*3/uL — AB (ref 4.0–10.5)

## 2018-06-06 LAB — BASIC METABOLIC PANEL
Anion gap: 8 (ref 5–15)
BUN: 21 mg/dL (ref 8–23)
CO2: 26 mmol/L (ref 22–32)
Calcium: 8.6 mg/dL — ABNORMAL LOW (ref 8.9–10.3)
Chloride: 106 mmol/L (ref 98–111)
Creatinine, Ser: 1.04 mg/dL — ABNORMAL HIGH (ref 0.44–1.00)
GFR calc Af Amer: >60 mL/min (ref >60)
GFR, EST NON AFRICAN AMERICAN: 55 mL/min — AB (ref >60)
Glucose, Bld: 126 mg/dL — ABNORMAL HIGH (ref 70–99)
POTASSIUM: 4.3 mmol/L (ref 3.5–5.1)
SODIUM: 140 mmol/L (ref 135–145)

## 2018-06-06 LAB — HEMOGLOBIN A1C
HEMOGLOBIN A1C: 7.3 % — AB (ref 4.8–5.6)
Mean Plasma Glucose: 162.81 mg/dL

## 2018-06-06 LAB — FERRITIN: FERRITIN: 9 ng/mL — AB (ref 11–307)

## 2018-06-06 LAB — HEPARIN LEVEL (UNFRACTIONATED): Heparin Unfractionated: 0.29 IU/mL — ABNORMAL LOW (ref 0.30–0.70)

## 2018-06-06 SURGERY — LEFT HEART CATH AND CORONARY ANGIOGRAPHY
Anesthesia: LOCAL

## 2018-06-06 MED ORDER — SODIUM CHLORIDE 0.9 % IV SOLN
INTRAVENOUS | Status: AC
  Administered 2018-06-07: 01:00:00 via INTRAVENOUS

## 2018-06-06 MED ORDER — SODIUM CHLORIDE 0.9 % IV SOLN: 250.0000 mL | INTRAVENOUS | Status: DC | PRN

## 2018-06-06 MED ORDER — FENTANYL CITRATE (PF) 100 MCG/2ML IJ SOLN
INTRAMUSCULAR | Status: DC | PRN
  Administered 2018-06-06: 25 ug via INTRAVENOUS

## 2018-06-06 MED ORDER — FENTANYL CITRATE (PF) 100 MCG/2ML IJ SOLN
INTRAMUSCULAR | Status: AC
  Filled 2018-06-06: qty 2

## 2018-06-06 MED ORDER — HEPARIN (PORCINE) IN NACL 1000-0.9 UT/500ML-% IV SOLN
INTRAVENOUS | Status: DC | PRN
  Administered 2018-06-06 (×2): 500 mL

## 2018-06-06 MED ORDER — SODIUM CHLORIDE 0.9% FLUSH
3.0000 mL | INTRAVENOUS | Status: DC | PRN
  Administered 2018-06-09: 3 mL via INTRAVENOUS
  Filled 2018-06-06: qty 3

## 2018-06-06 MED ORDER — MIDAZOLAM HCL 2 MG/2ML IJ SOLN
INTRAMUSCULAR | Status: AC
  Filled 2018-06-06: qty 2

## 2018-06-06 MED ORDER — INSULIN ASPART 100 UNIT/ML ~~LOC~~ SOLN
0.0000 [IU] | Freq: Three times a day (TID) | SUBCUTANEOUS | Status: DC
  Administered 2018-06-07 – 2018-06-08 (×5): 2 [IU] via SUBCUTANEOUS
  Administered 2018-06-08: 1 [IU] via SUBCUTANEOUS
  Administered 2018-06-09: 3 [IU] via SUBCUTANEOUS
  Administered 2018-06-09: 1 [IU] via SUBCUTANEOUS
  Administered 2018-06-09: 3 [IU] via SUBCUTANEOUS
  Administered 2018-06-10: 2 [IU] via SUBCUTANEOUS
  Administered 2018-06-10: 1 [IU] via SUBCUTANEOUS
  Administered 2018-06-10 – 2018-06-11 (×2): 2 [IU] via SUBCUTANEOUS
  Administered 2018-06-11 – 2018-06-12 (×2): 1 [IU] via SUBCUTANEOUS
  Administered 2018-06-12 (×2): 5 [IU] via SUBCUTANEOUS
  Administered 2018-06-13 – 2018-06-14 (×5): 2 [IU] via SUBCUTANEOUS
  Administered 2018-06-14: 3 [IU] via SUBCUTANEOUS
  Administered 2018-06-15: 2 [IU] via SUBCUTANEOUS
  Administered 2018-06-15: 5 [IU] via SUBCUTANEOUS
  Administered 2018-06-15: 3 [IU] via SUBCUTANEOUS
  Administered 2018-06-16: 2 [IU] via SUBCUTANEOUS
  Administered 2018-06-16: 3 [IU] via SUBCUTANEOUS
  Administered 2018-06-16 – 2018-06-17 (×2): 2 [IU] via SUBCUTANEOUS
  Administered 2018-06-17: 3 [IU] via SUBCUTANEOUS
  Administered 2018-06-17 – 2018-06-18 (×2): 2 [IU] via SUBCUTANEOUS
  Administered 2018-06-18 (×2): 3 [IU] via SUBCUTANEOUS
  Administered 2018-06-19: 2 [IU] via SUBCUTANEOUS
  Administered 2018-06-19: 5 [IU] via SUBCUTANEOUS

## 2018-06-06 MED ORDER — SODIUM CHLORIDE 0.9 % IV SOLN: 150.0000 mL/h | INTRAVENOUS | Status: AC

## 2018-06-06 MED ORDER — HEPARIN (PORCINE) IN NACL 1000-0.9 UT/500ML-% IV SOLN
INTRAVENOUS | Status: AC
  Filled 2018-06-06: qty 1000

## 2018-06-06 MED ORDER — NITROGLYCERIN IN D5W 200-5 MCG/ML-% IV SOLN
INTRAVENOUS | Status: AC | PRN
  Administered 2018-06-06: 10 ug/min via INTRAVENOUS

## 2018-06-06 MED ORDER — IOHEXOL 350 MG/ML SOLN
INTRAVENOUS | Status: DC | PRN
  Administered 2018-06-06: 90 mL via INTRA_ARTERIAL

## 2018-06-06 MED ORDER — HEPARIN SODIUM (PORCINE) 1000 UNIT/ML IJ SOLN
INTRAMUSCULAR | Status: AC
  Filled 2018-06-06: qty 1

## 2018-06-06 MED ORDER — VERAPAMIL HCL 2.5 MG/ML IV SOLN
INTRAVENOUS | Status: DC | PRN
  Administered 2018-06-06: 10 mL via INTRA_ARTERIAL

## 2018-06-06 MED ORDER — SODIUM CHLORIDE 0.9% FLUSH
3.0000 mL | Freq: Two times a day (BID) | INTRAVENOUS | Status: DC
  Administered 2018-06-06: 3 mL via INTRAVENOUS

## 2018-06-06 MED ORDER — LIDOCAINE HCL (PF) 1 % IJ SOLN
INTRAMUSCULAR | Status: AC
  Filled 2018-06-06: qty 30

## 2018-06-06 MED ORDER — SODIUM CHLORIDE 0.9% FLUSH: 3.0000 mL | INTRAVENOUS | Status: DC | PRN

## 2018-06-06 MED ORDER — MIDAZOLAM HCL 2 MG/2ML IJ SOLN
INTRAMUSCULAR | Status: DC | PRN
  Administered 2018-06-06: 1 mg via INTRAVENOUS

## 2018-06-06 MED ORDER — SODIUM CHLORIDE 0.9 % IV SOLN
INTRAVENOUS | Status: DC
  Administered 2018-06-06: 11:00:00 via INTRAVENOUS

## 2018-06-06 MED ORDER — NITROGLYCERIN IN D5W 200-5 MCG/ML-% IV SOLN
INTRAVENOUS | Status: AC
  Filled 2018-06-06: qty 250

## 2018-06-06 MED ORDER — MORPHINE SULFATE (PF) 2 MG/ML IV SOLN: 2.0000 mg | INTRAVENOUS | Status: DC | PRN

## 2018-06-06 MED ORDER — DIAZEPAM 5 MG PO TABS: 5.0000 mg | ORAL_TABLET | Freq: Four times a day (QID) | ORAL | Status: DC | PRN

## 2018-06-06 MED ORDER — SODIUM CHLORIDE 0.9% FLUSH
3.0000 mL | Freq: Two times a day (BID) | INTRAVENOUS | Status: DC
  Administered 2018-06-06 – 2018-06-17 (×19): 3 mL via INTRAVENOUS

## 2018-06-06 MED ORDER — NITROGLYCERIN IN D5W 200-5 MCG/ML-% IV SOLN
INTRAVENOUS | Status: AC | PRN
  Administered 2018-06-06: 5 ug/min via INTRAVENOUS

## 2018-06-06 MED ORDER — LIDOCAINE HCL (PF) 1 % IJ SOLN
INTRAMUSCULAR | Status: DC | PRN
  Administered 2018-06-06: 2 mL

## 2018-06-06 MED ORDER — INSULIN GLARGINE 100 UNIT/ML ~~LOC~~ SOLN
25.0000 [IU] | Freq: Two times a day (BID) | SUBCUTANEOUS | Status: DC
  Administered 2018-06-07 – 2018-06-19 (×25): 25 [IU] via SUBCUTANEOUS
  Filled 2018-06-06 (×33): qty 0.25

## 2018-06-06 MED ORDER — INSULIN ASPART 100 UNIT/ML ~~LOC~~ SOLN
15.0000 [IU] | Freq: Three times a day (TID) | SUBCUTANEOUS | Status: DC
  Administered 2018-06-07 – 2018-06-10 (×10): 15 [IU] via SUBCUTANEOUS

## 2018-06-06 MED ORDER — ROSUVASTATIN CALCIUM 20 MG PO TABS
40.0000 mg | ORAL_TABLET | Freq: Every day | ORAL | Status: DC
  Administered 2018-06-07 – 2018-06-18 (×13): 40 mg via ORAL
  Filled 2018-06-06 (×2): qty 1
  Filled 2018-06-06 (×2): qty 2
  Filled 2018-06-06: qty 1
  Filled 2018-06-06: qty 2
  Filled 2018-06-06: qty 1
  Filled 2018-06-06: qty 2
  Filled 2018-06-06: qty 1
  Filled 2018-06-06 (×2): qty 2
  Filled 2018-06-06: qty 1
  Filled 2018-06-06: qty 2

## 2018-06-06 MED ORDER — ONDANSETRON HCL 4 MG/2ML IJ SOLN: 4.0000 mg | Freq: Four times a day (QID) | INTRAMUSCULAR | Status: DC | PRN

## 2018-06-06 MED ORDER — NITROGLYCERIN IN D5W 200-5 MCG/ML-% IV SOLN: 0.0000 ug/min | INTRAVENOUS | Status: DC

## 2018-06-06 MED ORDER — VERAPAMIL HCL 2.5 MG/ML IV SOLN
INTRAVENOUS | Status: AC
  Filled 2018-06-06: qty 2

## 2018-06-06 MED ORDER — HEPARIN SODIUM (PORCINE) 1000 UNIT/ML IJ SOLN
INTRAMUSCULAR | Status: DC | PRN
  Administered 2018-06-06: 6000 [IU] via INTRAVENOUS

## 2018-06-06 MED ORDER — INSULIN ASPART 100 UNIT/ML ~~LOC~~ SOLN
0.0000 [IU] | Freq: Every day | SUBCUTANEOUS | Status: DC
  Administered 2018-06-07 – 2018-06-10 (×2): 2 [IU] via SUBCUTANEOUS
  Administered 2018-06-12: 3 [IU] via SUBCUTANEOUS
  Administered 2018-06-13: 0 [IU] via SUBCUTANEOUS
  Administered 2018-06-14 – 2018-06-18 (×3): 2 [IU] via SUBCUTANEOUS

## 2018-06-06 MED ORDER — ASPIRIN 81 MG PO CHEW: 81.0000 mg | CHEWABLE_TABLET | Freq: Every day | ORAL | Status: DC

## 2018-06-06 MED ORDER — ACETAMINOPHEN 325 MG PO TABS: 650.0000 mg | ORAL_TABLET | ORAL | Status: DC | PRN

## 2018-06-06 MED ORDER — ROSUVASTATIN CALCIUM 20 MG PO TABS: 20.0000 mg | ORAL_TABLET | Freq: Every day | ORAL | Status: DC

## 2018-06-06 MED ORDER — ASPIRIN EC 81 MG PO TBEC
81.0000 mg | DELAYED_RELEASE_TABLET | Freq: Every day | ORAL | Status: DC
  Administered 2018-06-06 – 2018-06-19 (×14): 81 mg via ORAL
  Filled 2018-06-06 (×14): qty 1

## 2018-06-06 SURGICAL SUPPLY — 13 items
CATH INFINITI 5FR ANG PIGTAIL (CATHETERS) ×2 IMPLANT
CATH OPTITORQUE TIG 4.0 5F (CATHETERS) ×2 IMPLANT
DEVICE RAD COMP TR BAND LRG (VASCULAR PRODUCTS) ×2 IMPLANT
ELECT DEFIB PAD ADLT CADENCE (PAD) ×2 IMPLANT
GLIDESHEATH SLEND SS 6F .021 (SHEATH) ×2 IMPLANT
GUIDEWIRE INQWIRE 1.5J.035X260 (WIRE) ×1 IMPLANT
HOVERMATT SINGLE USE (MISCELLANEOUS) ×2 IMPLANT
INQWIRE 1.5J .035X260CM (WIRE) ×2
KIT HEART LEFT (KITS) ×2 IMPLANT
PACK CARDIAC CATHETERIZATION (CUSTOM PROCEDURE TRAY) ×2 IMPLANT
SYR MEDRAD MARK V 150ML (SYRINGE) ×2 IMPLANT
TRANSDUCER W/STOPCOCK (MISCELLANEOUS) ×2 IMPLANT
TUBING CIL FLEX 10 FLL-RA (TUBING) ×2 IMPLANT

## 2018-06-06 NOTE — Progress Notes (Signed)
ANTICOAGULATION CONSULT NOTE - Initial Consult  Pharmacy Consult for heparin Indication: ACS/STEMI  Allergies  Allergen Reactions  . Atorvastatin Other (See Comments)    Leg weakness.    Patient Measurements: Height: 5\' 5"  (165.1 cm) Weight: 276 lb 14.4 oz (125.6 kg) IBW/kg (Calculated) : 57 Heparin Dosing Weight: 88 kg  Vital Signs: Temp: 98.2 F (36.8 C) (09/20 0620) Temp Source: Oral (09/20 0620) BP: 182/65 (09/20 0620) Pulse Rate: 64 (09/20 0620)  Labs: Recent Labs    06/05/18 1456 06/05/18 1501 06/05/18 1947 06/05/18 2304 06/06/18 0157 06/06/18 0538  HGB 10.1*  --   --   --   --  9.3*  HCT 33.1*  --   --   --   --  31.5*  PLT 257  --   --   --   --  239  APTT  --  32  --   --   --   --   HEPARINUNFRC  --   --   --  <0.10*  --  0.29*  CREATININE 0.97  --   --   --  1.04*  --   TROPONINI  --   --  0.25* 0.34* 0.35*  --     Estimated Creatinine Clearance: 70.9 mL/min (A) (by C-G formula based on SCr of 1.04 mg/dL (H)).   Medical History: Past Medical History:  Diagnosis Date  . Allergy   . Anxiety   . Asthma   . Diabetes mellitus without complication ([place])   . Gout   . Hyperlipidemia   . Hypertension   . Hypothyroid   . Neuropathy   . Obesity   . Vitamin D deficiency     Medications:  Medications Prior to Admission  Medication Sig Dispense Refill Last Dose  . acyclovir (ZOVIRAX) 400 MG tablet TAKE 1 TABLET EVERY DAY 90 tablet 0 06/05/2018 at Unknown time  . albuterol (PROVENTIL HFA;VENTOLIN HFA) 108 (90 Base) MCG/ACT inhaler Inhale 1-2 puffs into the lungs every 6 (six) hours as needed for wheezing or shortness of breath.   06/05/2018 at Unknown time  . ALPRAZolam (XANAX) 0.5 MG tablet TAKE 1 TABLET 2 TIMES A DAY AS NEEDED FOR ANXIETY 45 tablet 2 06/05/2018 at Unknown time  . amLODipine (NORVASC) 10 MG tablet TAKE 1 TABLET EVERY DAY 90 tablet 0 06/05/2018 at Unknown time  . B-D UF III MINI PEN NEEDLES 31G X 5 MM MISC USE AS INSTRUCTED TO GIVE LANTUS  INJECTION TWICE DAILY 180 each 1 06/05/2018 at Unknown time  . fluticasone (FLONASE) 50 MCG/ACT nasal spray USE 2 SPRAYS IN EACH NOSTRIL EVERY DAY 48 g 1 06/05/2018 at Unknown time  . furosemide (LASIX) 20 MG tablet TAKE 1 TABLET (20 MG TOTAL) BY MOUTH DAILY. 90 tablet 0 06/05/2018 at Unknown time  . HYDROcodone-acetaminophen (NORCO) 7.5-325 MG tablet Take 1 tablet by mouth at bedtime. 30 tablet 0 06/04/2018 at Unknown time  . Ipratropium-Albuterol (COMBIVENT RESPIMAT) 20-100 MCG/ACT AERS respimat Inhale 1 puff into the lungs every 6 (six) hours as needed for wheezing. 12 g 1 06/05/2018 at Unknown time  . LANTUS SOLOSTAR 100 UNIT/ML Solostar Pen INJECT 45 UNITS SUBCUTANEOUSLY 2 TIMES DAILY 90 mL 0 06/05/2018 at Unknown time  . levothyroxine (SYNTHROID, LEVOTHROID) 50 MCG tablet TAKE 1 TABLET EVERY DAY BEFORE BREAKFAST 90 tablet 3 06/05/2018 at Unknown time  . losartan (COZAAR) 100 MG tablet TAKE 1 TABLET EVERY DAY 90 tablet 0 06/05/2018 at Unknown time  . metoprolol succinate (TOPROL-XL) 25 MG  24 hr tablet Take 1 tablet (25 mg total) by mouth daily. 90 tablet 3 06/05/2018 at 0900  . montelukast (SINGULAIR) 10 MG tablet TAKE 1 TABLET (10 MG TOTAL) BY MOUTH AT BEDTIME. 90 tablet 1 06/04/2018 at Unknown time  . NOVOLOG FLEXPEN 100 UNIT/ML FlexPen INJECT 45 UNITS SUBCUTANEOUSLY THREE TIMES DAILY WITH MEALS 120 mL 1 06/05/2018 at Unknown time  . omeprazole (PRILOSEC) 40 MG capsule TAKE 1 CAPSULE EVERY DAY 90 capsule 0 06/05/2018 at Unknown time  . rosuvastatin (CRESTOR) 20 MG tablet Take 1 tablet (20 mg total) by mouth at bedtime. 30 tablet 11 06/04/2018 at Unknown time  . ACCU-CHEK SOFTCLIX LANCETS lancets Test BG TID 300 each 3 Taking    Assessment: Pharmacy consulted to dose heparin in patient for ACS/STEMI.  Patient's troponin on admission is 0.11.  Goal of Therapy:  Heparin level 0.3-0.7 units/ml Monitor platelets by anticoagulation protocol: Yes   Plan:  Increase heparin to 1700 units/hr Check anti-Xa  level daily while on heparin Continue to monitor H&H and platelets.   Mary Raymond 06/06/2018,11:12 AM

## 2018-06-06 NOTE — Progress Notes (Signed)
[facility] for heparin Indication: ACS/STEMI  Allergies  Allergen Reactions  . Atorvastatin Other (See Comments)    Leg weakness.    Patient Measurements: Height: 5\' 5"  (165.1 cm) Weight: 269 lb 14.4 oz (122.4 kg) IBW/kg (Calculated) : 57 Heparin Dosing Weight: 88 kg  Vital Signs: Temp: 98 F (36.7 C) (09/20 1518) Temp Source: Oral (09/20 1518) BP: 168/72 (09/20 1656) Pulse Rate: 69 (09/20 1656)  Labs: Recent Labs    06/05/18 1456 06/05/18 1501 06/05/18 1947 06/05/18 2304 06/06/18 0157 06/06/18 0538  HGB 10.1*  --   --   --   --  9.3*  HCT 33.1*  --   --   --   --  31.5*  PLT 257  --   --   --   --  239  APTT  --  32  --   --   --   --   HEPARINUNFRC  --   --   --  <0.10*  --  0.29*  CREATININE 0.97  --   --   --  1.04*  --   TROPONINI  --   --  0.25* 0.34* 0.35*  --     Estimated Creatinine Clearance: 69.9 mL/min (A) (by C-G formula based on SCr of 1.04 mg/dL (H)).   Medical History: Past Medical History:  Diagnosis Date  . Allergy   . Anxiety   . Asthma   . Diabetes mellitus without complication ([place])   . Gout   . Hyperlipidemia   . Hypertension   . Hypothyroid   . Neuropathy   . Obesity   . Vitamin D deficiency     Medications:  Medications Prior to Admission  Medication Sig Dispense Refill Last Dose  . acyclovir (ZOVIRAX) 400 MG tablet TAKE 1 TABLET EVERY DAY 90 tablet 0 06/05/2018 at Unknown time  . albuterol (PROVENTIL HFA;VENTOLIN HFA) 108 (90 Base) MCG/ACT inhaler Inhale 1-2 puffs into the lungs every 6 (six) hours as needed for wheezing or shortness of breath.   06/05/2018 at Unknown time  . ALPRAZolam (XANAX) 0.5 MG tablet TAKE 1 TABLET 2 TIMES A DAY AS NEEDED FOR ANXIETY 45 tablet 2 06/05/2018 at Unknown time  . amLODipine (NORVASC) 10 MG tablet TAKE 1 TABLET EVERY DAY 90 tablet 0 06/05/2018 at Unknown time  . B-D UF III MINI PEN NEEDLES 31G X 5 MM MISC USE AS INSTRUCTED TO GIVE LANTUS INJECTION  TWICE DAILY 180 each 1 06/05/2018 at Unknown time  . fluticasone (FLONASE) 50 MCG/ACT nasal spray USE 2 SPRAYS IN EACH NOSTRIL EVERY DAY 48 g 1 06/05/2018 at Unknown time  . furosemide (LASIX) 20 MG tablet TAKE 1 TABLET (20 MG TOTAL) BY MOUTH DAILY. 90 tablet 0 06/05/2018 at Unknown time  . HYDROcodone-acetaminophen (NORCO) 7.5-325 MG tablet Take 1 tablet by mouth at bedtime. 30 tablet 0 06/04/2018 at Unknown time  . Ipratropium-Albuterol (COMBIVENT RESPIMAT) 20-100 MCG/ACT AERS respimat Inhale 1 puff into the lungs every 6 (six) hours as needed for wheezing. 12 g 1 06/05/2018 at Unknown time  . LANTUS SOLOSTAR 100 UNIT/ML Solostar Pen INJECT 45 UNITS SUBCUTANEOUSLY 2 TIMES DAILY 90 mL 0 06/05/2018 at Unknown time  . levothyroxine (SYNTHROID, LEVOTHROID) 50 MCG tablet TAKE 1 TABLET EVERY DAY BEFORE BREAKFAST 90 tablet 3 06/05/2018 at Unknown time  . losartan (COZAAR) 100 MG tablet TAKE 1 TABLET EVERY DAY 90 tablet 0 06/05/2018 at Unknown time  . metoprolol succinate (TOPROL-XL) 25 MG 24 hr  tablet Take 1 tablet (25 mg total) by mouth daily. 90 tablet 3 06/05/2018 at 0900  . montelukast (SINGULAIR) 10 MG tablet TAKE 1 TABLET (10 MG TOTAL) BY MOUTH AT BEDTIME. 90 tablet 1 06/04/2018 at Unknown time  . NOVOLOG FLEXPEN 100 UNIT/ML FlexPen INJECT 45 UNITS SUBCUTANEOUSLY THREE TIMES DAILY WITH MEALS 120 mL 1 06/05/2018 at Unknown time  . omeprazole (PRILOSEC) 40 MG capsule TAKE 1 CAPSULE EVERY DAY 90 capsule 0 06/05/2018 at Unknown time  . rosuvastatin (CRESTOR) 20 MG tablet Take 1 tablet (20 mg total) by mouth at bedtime. 30 tablet 11 06/04/2018 at Unknown time  . ACCU-CHEK SOFTCLIX LANCETS lancets Test BG TID 300 each 3 Taking    Assessment: Pharmacy consulted to dose heparin in patient for ACS/STEMI.  She is now s/p cath and heparin to restart 8 hrs post sheath removal (removed ~ 5pm; TR band applied).  Last heparin rate was 1700 units/hr  Goal of Therapy:  Heparin level 0.3-0.7 units/ml Monitor platelets by  anticoagulation protocol: Yes   Plan:  -Restart heparin at 1700 units/hr at 1:00am on 9/21 as long as there are no issues with the TR band removal -Heparin level in 8 hours and daily wth CBC daily  Hildred Laser, PharmD Clinical Pharmacist Please check Amion for pharmacy contact number

## 2018-06-06 NOTE — H&P (View-Only) (Signed)
Progress Note  Patient Name: Mary Raymond Date of Encounter: 06/06/2018  Primary Cardiologist: Carlyle Dolly, MD   Subjective   She reports intermittent episodes of chest discomfort overnight which have been lasting for minutes at a time and spontaneously resolving. Breathing has been at baseline. Denies any specific palpitations, orthopnea, or PND.   Inpatient Medications    Scheduled Meds: . acyclovir  400 mg Oral Daily  . amLODipine  10 mg Oral Daily  . aspirin EC  325 mg Oral Daily  . fluticasone  2 spray Each Nare Daily  . furosemide  20 mg Oral Daily  . HYDROcodone-acetaminophen  1 tablet Oral QHS  . insulin aspart  0-15 Units Subcutaneous TID WC  . insulin aspart  20 Units Subcutaneous TID WC  . insulin glargine  30 Units Subcutaneous BID  . levothyroxine  100 mcg Oral QAC breakfast  . losartan  100 mg Oral Daily  . montelukast  10 mg Oral QHS  . nitroGLYCERIN  0.5 inch Topical Q6H  . pantoprazole  80 mg Oral Daily  . rosuvastatin  20 mg Oral QHS   Continuous Infusions: . heparin 1,600 Units/hr (06/06/18 3810)   PRN Meds: acetaminophen, albuterol, ALPRAZolam, gi cocktail, ipratropium-albuterol, morphine injection, ondansetron (ZOFRAN) IV   Vital Signs    Vitals:   06/05/18 2127 06/05/18 2230 06/06/18 0228 06/06/18 0620  BP: (!) 113/57 (!) 158/60 (!) 166/60 (!) 182/65  Pulse: 91 69 60 64  Resp: 18 18 18 18   Temp: 98.6 F (37 C) 97.8 F (36.6 C) 97.9 F (36.6 C) 98.2 F (36.8 C)  TempSrc: Oral Oral Oral Oral  SpO2: 96% 98% 97% 98%  Weight:      Height:        Intake/Output Summary (Last 24 hours) at 06/06/2018 0752 Last data filed at 06/06/2018 1751 Gross per 24 hour  Intake 923.4 ml  Output 1500 ml  Net -576.6 ml   Filed Weights   06/05/18 1454 06/05/18 1835  Weight: 126.7 kg 125.6 kg    Telemetry    Sinus bradycardia, HR variable from the mid-30's to 60's. Episodes of Wenckebach overnight and this morning.  - Personally  Reviewed  ECG    Sinus bradycardia, HR 48, with lengthening of PR interval and dropped beats, consistent with Mobitz 1.  Downsloping of the ST segment along lateral leads which is similar to tracings from 9/19.- Personally Reviewed  Physical Exam   General: Well developed, obese Caucasian female appearing in no acute distress. Head: Normocephalic, atraumatic.  Neck: Supple without bruits, JVD difficul to assess secondary to body habitus. Lungs:  Resp regular and unlabored, CTA without wheezing or rales Heart: Regular rhythm, bradycardiac rate, S1, S2, no S3, S4, 2/6 SEM along RUSB; no rub. Abdomen: Soft, non-tender, non-distended with normoactive bowel sounds. No hepatomegaly. No rebound/guarding. No obvious abdominal masses. Extremities: No clubbing or cyanosis, trace lower extremity edema bilaterally. SCD's in place.  Distal pedal pulses are 2+ bilaterally. Neuro: Alert and oriented X 3. Moves all extremities spontaneously. Psych: Normal affect.  Labs    Chemistry Recent Labs  Lab 06/05/18 1456  NA 139  K 4.0  CL 106  CO2 25  GLUCOSE 70  BUN 20  CREATININE 0.97  CALCIUM 9.1  GFRNONAA 60*  GFRAA >60  ANIONGAP 8     Hematology Recent Labs  Lab 06/05/18 1456 06/06/18 0538  WBC 12.5* 10.7*  RBC 4.09 3.87  HGB 10.1* 9.3*  HCT 33.1* 31.5*  MCV 80.9  81.4  MCH 24.7* 24.0*  MCHC 30.5 29.5*  RDW 16.5* 16.7*  PLT 257 239    Cardiac Enzymes Recent Labs  Lab 06/05/18 1947 06/05/18 2304  TROPONINI 0.25* 0.34*    Recent Labs  Lab 06/05/18 1505  TROPIPOC 0.11*     BNP Recent Labs  Lab 06/05/18 1526  BNP 197.0*     DDimer No results for input(s): DDIMER in the last 168 hours.   Radiology    Dg Chest 2 View  Result Date: 06/05/2018 CLINICAL DATA:  Chest pain EXAM: CHEST - 2 VIEW COMPARISON:  12/01/2014 FINDINGS: There is no focal parenchymal opacity. There is no pleural effusion or pneumothorax. There is stable cardiomegaly. There is thoracic aortic  atherosclerosis. The osseous structures are unremarkable. IMPRESSION: No active cardiopulmonary disease. Electronically Signed   By: Kathreen Devoid   On: 06/05/2018 15:50    Cardiac Studies   Echocardiogram: Pending  Patient Profile     67 y.o. female with past medical history of HTN, HLD, and type II DM who presented to [facility], ED on 06/05/2018 for evaluation of chest pain. She has ruled-in with most recent troponin value elevated to 0.34.  Assessment & Plan    1. NSTEMI - She presented for evaluation of chest pain which overall has mixed symptoms as pain has been occurring intermittently over the past month and can last for minutes up to hours and is sometimes worse with positional changes. BNP was mildly elevated to 197 on admission and she received a dose of IV Lasix while in the ED. Appears close to euvolemic by examination today. Will add on repeat BMET to AM labs. Cyclic troponin values have continued to trend upwards from 0.11 to 0.25, and 0.34 on most recent check and she continues to have intermittent episodes of chest pain. She has been started on IV Heparin.  - She is not a good candidate for noninvasive testing given her body habitus. With cardiac enzymes continuing to trend upwards, she will likely require definitive evaluation with a cardiac catheterization. Will review with Dr. Domenic Polite for final assessment. The patient understands that risks include but are not limited to stroke (1 in 1000), death (1 in 82), kidney failure [usually temporary] (1 in 500), bleeding (1 in 200), allergic reaction [possibly serious] (1 in 200).   - continue Heparin, ASA, and statin therapy. BB held given bradycardia.   2. 2nd Degree AV Block, Type 1  - HR has been variable from the mid-30's to 50's on telemetry with intermittent Wenckebach.  - Was on Toprol-XL 25mg  daily PTA. This has been discontinued.   3. HTN - BP has been variable from 113/51 - 187/76 since admission. She has been continued  on PTA Losartan and Amlodipine. Toprol-XL held given episodes of bradycardia as outlined above. Continue to follow this admission.   4. HLD - FLP from 02/2018 showed total cholesterol 201, triglycerides 207, LDL 118 and HDL 42. She has been continued on Crestor 20mg  daily. Will titrate to 40mg  daily.   5. IDDM - Hgb A1c at 8.3 in 02/2018. Repeat pending. Metformin held on admission. Glucose has been variable, as low as 23 while in the ED. Improved to 102 this AM.  - medication adjustment per admitting team.   6. Chronic Anemia - Baseline Hgb ~ 10.0 dating back over the past few years. Stable at 10.1 on admission. She denies any evidence of active bleeding.   7. Deconditioning - she reports only been able to stand  for 2 to 3 minutes at a time. Family has encouraged her to move to an assisted living facility but she has declined. May require placement at hospital discharge.  8. Murmur - she does have a SEM along the RUSB. Echocardiogram is pending.   For questions or updates, please contact [place] Please consult [email or web address] for contact info under Cardiology/STEMI.   Arna Medici , PA-C 7:52 AM 06/06/2018 Pager: [phone]   Attending note:  Patient seen and examined.  I reviewed cardiology consult note by Dr. Harl Bowie and subsequently discussed the case with Mary Raymond.  Mary Raymond is a 67 year old woman with morbid obesity, hypertension, hyperlipidemia, uncontrolled type 2 diabetes mellitus, chronic anemia, as well as deconditioning.  She presented with fairly atypical chest discomfort, however was found to have mildly abnormal troponin I levels going from 0.11 up to 0.34.  She has continued to experience intermittent chest discomfort under observation.  ECG is nonspecific although she does have episodes of Wenkebach block and bradycardia.  On examination this morning she appears comfortable.  Systolic blood pressure is running in the 160s to 180s, heart  rate in the 60s on average.  Lungs are clear without labored breathing.  Cardiac exam reveals RRR with 2/6 systolic murmur.  Lab work shows potassium 4.3, BUN 21, creatinine 1.0, hemoglobin 9.3 which is relatively stable.  I personally reviewed her ECG which shows sinus bradycardia with prolonged PR interval and poor R wave progression rule out old anterior infarct pattern, second-degree type I block also noted.  Chest x-ray reports no acute cardia pulmonary disease.  Patient presents with atypical chest pain in the setting of poorly controlled diabetes mellitus with abnormal cardiac markers suggestive of NSTEMI.  She has had fairly poor functional capacity recently based on discussion in the setting of other comorbid illnesses.  Anemia looks to be chronic, she does not report any recent bleeding problems or changes in her stools.  After discussion of risks and benefits, plan is to have her transferred to [facility] for a diagnostic cardiac catheterization.  She will stay on the hospitalist team for optimal management of her comorbidities.  Suspect she will likely need PT/OT evaluation and potential further rehabilitation after discharge.  Satira Sark, M.D., F.A.C.C.

## 2018-06-06 NOTE — Progress Notes (Signed)
Critical EKG results reported to pt's RN, Wenda Overland. EKG placed on pt's chart

## 2018-06-06 NOTE — Progress Notes (Signed)
CRITICAL VALUE ALERT  Critical Value:  EKG: Second degree heart block  Date & Time Notied:  06/06/18 0602  Provider Notified: Darrick Meigs, MD  Orders Received/Actions taken: Metoprolol discontinued. Cardiology already consulted.

## 2018-06-06 NOTE — Progress Notes (Signed)
Report called earlier to [place] at [facility]. Transorted by [employer]  for cath

## 2018-06-06 NOTE — Progress Notes (Signed)
Critical troponin called earlier, 0.35.  Dr.Emokpae in patient's room at time and made aware.  Awaiting transfer to [facility] for cath

## 2018-06-06 NOTE — Interval H&P Note (Signed)
Cath Lab Visit (complete for each Cath Lab visit)  Clinical Evaluation Leading to the Procedure:   ACS: Yes.    Non-ACS:    Anginal Classification: CCS IV  Anti-ischemic medical therapy: Minimal Therapy (1 class of medications)  Non-Invasive Test Results: No non-invasive testing performed  Prior CABG: No previous CABG      History and Physical Interval Note:  06/06/2018 4:00 PM  Mary Raymond  has presented today for surgery, with the diagnosis of NSTEMI  The various methods of treatment have been discussed with the patient and family. After consideration of risks, benefits and other options for treatment, the patient has consented to  Procedure(s): LEFT HEART CATH AND CORONARY ANGIOGRAPHY (N/A) as a surgical intervention .  The patient's history has been reviewed, patient examined, no change in status, stable for surgery.  I have reviewed the patient's chart and labs.  Questions were answered to the patient's satisfaction.     Shelva Majestic

## 2018-06-06 NOTE — Progress Notes (Addendum)
Progress Note  Patient Name: Mary Raymond Date of Encounter: 06/06/2018  Primary Cardiologist: Carlyle Dolly, MD   Subjective   She reports intermittent episodes of chest discomfort overnight which have been lasting for minutes at a time and spontaneously resolving. Breathing has been at baseline. Denies any specific palpitations, orthopnea, or PND.   Inpatient Medications    Scheduled Meds: . acyclovir  400 mg Oral Daily  . amLODipine  10 mg Oral Daily  . aspirin EC  325 mg Oral Daily  . fluticasone  2 spray Each Nare Daily  . furosemide  20 mg Oral Daily  . HYDROcodone-acetaminophen  1 tablet Oral QHS  . insulin aspart  0-15 Units Subcutaneous TID WC  . insulin aspart  20 Units Subcutaneous TID WC  . insulin glargine  30 Units Subcutaneous BID  . levothyroxine  100 mcg Oral QAC breakfast  . losartan  100 mg Oral Daily  . montelukast  10 mg Oral QHS  . nitroGLYCERIN  0.5 inch Topical Q6H  . pantoprazole  80 mg Oral Daily  . rosuvastatin  20 mg Oral QHS   Continuous Infusions: . heparin 1,600 Units/hr (06/06/18 7793)   PRN Meds: acetaminophen, albuterol, ALPRAZolam, gi cocktail, ipratropium-albuterol, morphine injection, ondansetron (ZOFRAN) IV   Vital Signs    Vitals:   06/05/18 2127 06/05/18 2230 06/06/18 0228 06/06/18 0620  BP: (!) 113/57 (!) 158/60 (!) 166/60 (!) 182/65  Pulse: 91 69 60 64  Resp: 18 18 18 18   Temp: 98.6 F (37 C) 97.8 F (36.6 C) 97.9 F (36.6 C) 98.2 F (36.8 C)  TempSrc: Oral Oral Oral Oral  SpO2: 96% 98% 97% 98%  Weight:      Height:        Intake/Output Summary (Last 24 hours) at 06/06/2018 0752 Last data filed at 06/06/2018 9030 Gross per 24 hour  Intake 923.4 ml  Output 1500 ml  Net -576.6 ml   Filed Weights   06/05/18 1454 06/05/18 1835  Weight: 126.7 kg 125.6 kg    Telemetry    Sinus bradycardia, HR variable from the mid-30's to 60's. Episodes of Wenckebach overnight and this morning.  - Personally  Reviewed  ECG    Sinus bradycardia, HR 48, with lengthening of PR interval and dropped beats, consistent with Mobitz 1.  Downsloping of the ST segment along lateral leads which is similar to tracings from 9/19.- Personally Reviewed  Physical Exam   General: Well developed, obese Caucasian female appearing in no acute distress. Head: Normocephalic, atraumatic.  Neck: Supple without bruits, JVD difficul to assess secondary to body habitus. Lungs:  Resp regular and unlabored, CTA without wheezing or rales Heart: Regular rhythm, bradycardiac rate, S1, S2, no S3, S4, 2/6 SEM along RUSB; no rub. Abdomen: Soft, non-tender, non-distended with normoactive bowel sounds. No hepatomegaly. No rebound/guarding. No obvious abdominal masses. Extremities: No clubbing or cyanosis, trace lower extremity edema bilaterally. SCD's in place.  Distal pedal pulses are 2+ bilaterally. Neuro: Alert and oriented X 3. Moves all extremities spontaneously. Psych: Normal affect.  Labs    Chemistry Recent Labs  Lab 06/05/18 1456  NA 139  K 4.0  CL 106  CO2 25  GLUCOSE 70  BUN 20  CREATININE 0.97  CALCIUM 9.1  GFRNONAA 60*  GFRAA >60  ANIONGAP 8     Hematology Recent Labs  Lab 06/05/18 1456 06/06/18 0538  WBC 12.5* 10.7*  RBC 4.09 3.87  HGB 10.1* 9.3*  HCT 33.1* 31.5*  MCV 80.9  81.4  MCH 24.7* 24.0*  MCHC 30.5 29.5*  RDW 16.5* 16.7*  PLT 257 239    Cardiac Enzymes Recent Labs  Lab 06/05/18 1947 06/05/18 2304  TROPONINI 0.25* 0.34*    Recent Labs  Lab 06/05/18 1505  TROPIPOC 0.11*     BNP Recent Labs  Lab 06/05/18 1526  BNP 197.0*     DDimer No results for input(s): DDIMER in the last 168 hours.   Radiology    Dg Chest 2 View  Result Date: 06/05/2018 CLINICAL DATA:  Chest pain EXAM: CHEST - 2 VIEW COMPARISON:  12/01/2014 FINDINGS: There is no focal parenchymal opacity. There is no pleural effusion or pneumothorax. There is stable cardiomegaly. There is thoracic aortic  atherosclerosis. The osseous structures are unremarkable. IMPRESSION: No active cardiopulmonary disease. Electronically Signed   By: Kathreen Devoid   On: 06/05/2018 15:50    Cardiac Studies   Echocardiogram: Pending  Patient Profile     67 y.o. female with past medical history of HTN, HLD, and type II DM who presented to [facility], ED on 06/05/2018 for evaluation of chest pain. She has ruled-in with most recent troponin value elevated to 0.34.  Assessment & Plan    1. NSTEMI - She presented for evaluation of chest pain which overall has mixed symptoms as pain has been occurring intermittently over the past month and can last for minutes up to hours and is sometimes worse with positional changes. BNP was mildly elevated to 197 on admission and she received a dose of IV Lasix while in the ED. Appears close to euvolemic by examination today. Will add on repeat BMET to AM labs. Cyclic troponin values have continued to trend upwards from 0.11 to 0.25, and 0.34 on most recent check and she continues to have intermittent episodes of chest pain. She has been started on IV Heparin.  - She is not a good candidate for noninvasive testing given her body habitus. With cardiac enzymes continuing to trend upwards, she will likely require definitive evaluation with a cardiac catheterization. Will review with Dr. Domenic Polite for final assessment. The patient understands that risks include but are not limited to stroke (1 in 1000), death (1 in 75), kidney failure [usually temporary] (1 in 500), bleeding (1 in 200), allergic reaction [possibly serious] (1 in 200).   - continue Heparin, ASA, and statin therapy. BB held given bradycardia.   2. 2nd Degree AV Block, Type 1  - HR has been variable from the mid-30's to 50's on telemetry with intermittent Wenckebach.  - Was on Toprol-XL 25mg  daily PTA. This has been discontinued.   3. HTN - BP has been variable from 113/51 - 187/76 since admission. She has been continued  on PTA Losartan and Amlodipine. Toprol-XL held given episodes of bradycardia as outlined above. Continue to follow this admission.   4. HLD - FLP from 02/2018 showed total cholesterol 201, triglycerides 207, LDL 118 and HDL 42. She has been continued on Crestor 20mg  daily. Will titrate to 40mg  daily.   5. IDDM - Hgb A1c at 8.3 in 02/2018. Repeat pending. Metformin held on admission. Glucose has been variable, as low as 23 while in the ED. Improved to 102 this AM.  - medication adjustment per admitting team.   6. Chronic Anemia - Baseline Hgb ~ 10.0 dating back over the past few years. Stable at 10.1 on admission. She denies any evidence of active bleeding.   7. Deconditioning - she reports only been able to stand  for 2 to 3 minutes at a time. Family has encouraged her to move to an assisted living facility but she has declined. May require placement at hospital discharge.  8. Murmur - she does have a SEM along the RUSB. Echocardiogram is pending.   For questions or updates, please contact [place] Please consult [email or web address] for contact info under Cardiology/STEMI.   Arna Medici , PA-C 7:52 AM 06/06/2018 Pager: [phone]   Attending note:  Patient seen and examined.  I reviewed cardiology consult note by Dr. Harl Bowie and subsequently discussed the case with Mary Raymond.  Mary Raymond is a 67 year old woman with morbid obesity, hypertension, hyperlipidemia, uncontrolled type 2 diabetes mellitus, chronic anemia, as well as deconditioning.  She presented with fairly atypical chest discomfort, however was found to have mildly abnormal troponin I levels going from 0.11 up to 0.34.  She has continued to experience intermittent chest discomfort under observation.  ECG is nonspecific although she does have episodes of Wenkebach block and bradycardia.  On examination this morning she appears comfortable.  Systolic blood pressure is running in the 160s to 180s, heart  rate in the 60s on average.  Lungs are clear without labored breathing.  Cardiac exam reveals RRR with 2/6 systolic murmur.  Lab work shows potassium 4.3, BUN 21, creatinine 1.0, hemoglobin 9.3 which is relatively stable.  I personally reviewed her ECG which shows sinus bradycardia with prolonged PR interval and poor R wave progression rule out old anterior infarct pattern, second-degree type I block also noted.  Chest x-ray reports no acute cardia pulmonary disease.  Patient presents with atypical chest pain in the setting of poorly controlled diabetes mellitus with abnormal cardiac markers suggestive of NSTEMI.  She has had fairly poor functional capacity recently based on discussion in the setting of other comorbid illnesses.  Anemia looks to be chronic, she does not report any recent bleeding problems or changes in her stools.  After discussion of risks and benefits, plan is to have her transferred to [facility] for a diagnostic cardiac catheterization.  She will stay on the hospitalist team for optimal management of her comorbidities.  Suspect she will likely need PT/OT evaluation and potential further rehabilitation after discharge.  Satira Sark, M.D., F.A.C.C.

## 2018-06-06 NOTE — Telephone Encounter (Signed)
Seen yesterday   This med not on list

## 2018-06-06 NOTE — Progress Notes (Signed)
CRITICAL VALUE ALERT  Critical Value:  Troponin 0.34   Date & Time Notied:  06/06/18 0023   Provider Notified: Hilbert Bible, NP 06/06/18 0024  Orders Received/Actions taken: Awaiting response.

## 2018-06-06 NOTE — Progress Notes (Addendum)
Pre op CABG prelim   Right Carotid:Velocities in the right ICA are consistent with a 1-39% stenosis.  Left Carotid: Velocities in the left ICA are consistent with a 1-39% stenosis.    Right ABI: Resting right ankle-brachial index is within normal range. No evidence of significant right lower extremity arterial disease.  Left ABI: Resting left ankle-brachial index is within normal range. No evidence of significant left lower extremity arterial disease.    Right Upper Extremity: Allen's test not performed due to TR band.  Left Upper Extremity: Doppler waveform obliterate with left radial compression. Doppler waveforms remain within normal limits with left ulnar compression.     Landry Mellow, RDMS, RVT  Oliver Hum, RVT

## 2018-06-06 NOTE — Progress Notes (Addendum)
CRITICAL VALUE ALERT  Critical Value:  Troponin 0.25  Date & Time Notied:  06/05/18 2109  Provider Notified: Hilbert Bible, NP 06/05/18 2120  Orders Received/Actions taken: No new orders.  This RN Retail banker, NP of patient's HR 30s-50s per tele with HB. HR sustaining 30s at times. Patient asymptomatic and denies chest pain. This RN also notified Schorr, NP of BG 87, and verified if scheduled Lantus 30 units to be given. Lantus 15 units given x1 instead per Schorr, NP.

## 2018-06-06 NOTE — Progress Notes (Addendum)
Patient Demographics:    Mary Raymond, is a 67 y.o. female, DOB - 10-12-1950, IRC:[id number]  Admit date - 06/05/2018   Admitting Physician Shelbie Proctor, MD  Outpatient Primary MD for the patient is [name], FNP  LOS - 0   Chief Complaint  Patient presents with  . Chest Pain        Subjective:    Mary Raymond today has no fevers, no emesis, intermittent chest pains, has chronic diarrhea  Assessment  & Plan :    Principal Problem:   Chest pain Active Problems:   Hypothyroidism   Hypertension associated with diabetes ([place])   Hyperlipidemia associated with type 2 diabetes mellitus (HCC)   Obesity, morbid, BMI 40.0-49.9 (HCC)   Elevated troponin  Brief Summary:- 67 year old with Morbid obesity, hypertension, HLD and diabetes admitted with chest pain on 06/05/2018, now has troponin bump to 0.35, cardiology recommends left heart cath, transferred to [facility] on 06/06/18, currently on IV heparin  Plan:- 1)Chest pains with elevated troponin--- multiple cardiovascular risk factors as outlined above, suspect NSTEMI, troponin peaked at 0.35, patient is very sedentary with poor endurance, also body habitus would make it difficult for noninvasive testing, cardiology recommends LHC,  transfer [facility] on 06/06/2018, LDL 118, HDL 42, continue aspirin and Crestor, Toprol-XL has been discontinued due to bradycardia  2)DM2-recent A1c 8.3, however patient had hypoglycemic episode, continue to hold metformin, hold Lantus insulin as patient is n.p.o for now,  Allow some permissive Hyperglycemia rather than risk life-threatening hypoglycemia in a patient who is NPO awaiting LHC, use low dose Novolog/Humalog Sliding scale insulin with Accu-Cheks/Fingersticks as ordered  3)HTN--stable, continue amlodipine 10 mg daily, continue losartan 100 mg daily, Toprol-XL 25 mg is been discontinued due to  bradycardia  4)Chronic Anemia--Hgb usually around 10 patient is currently around her baseline, long-standing history of normocytic, hypochromic anemia for the last 3 years, last colonoscopy more than 8 years ago, patient sees Dr. Watt Climes at [place].  Iron studies and anemia work-up including stool occult blood is been ordered  5)Heart Murmur and Second-Degree AV  Block Type I-- overnight Heart Rate was in the 30s to 50s, Toprol-XL discontinued, echocardiogram pending  6)Hypothyroidism- stable with a TSH of 1.8, continue levothyroxine 100 mcg daily  Disposition/Need for in-Hospital Stay- patient unable to be discharged at this time due to intermittent chest pains with elevated troponin requiring IV heparin drip awaiting left heart catheterization.  Please see cardiology consult and progress note  Code Status : Full   Disposition Plan  : Transfer to [facility] hospital  Consults  :  Cardiology  DVT Prophylaxis  :  Iv Heparin  Lab Results  Component Value Date   PLT 239 06/06/2018    Inpatient Medications  Scheduled Meds: . acyclovir  400 mg Oral Daily  . amLODipine  10 mg Oral Daily  . aspirin EC  81 mg Oral Daily  . fluticasone  2 spray Each Nare Daily  . furosemide  20 mg Oral Daily  . HYDROcodone-acetaminophen  1 tablet Oral QHS  . insulin aspart  0-5 Units Subcutaneous QHS  . insulin aspart  0-9 Units Subcutaneous TID WC  . [START ON 06/07/2018] insulin aspart  15 Units Subcutaneous TID WC  . [  START ON 06/07/2018] insulin glargine  25 Units Subcutaneous BID  . levothyroxine  100 mcg Oral QAC breakfast  . losartan  100 mg Oral Daily  . montelukast  10 mg Oral QHS  . nitroGLYCERIN  0.5 inch Topical Q6H  . pantoprazole  80 mg Oral Daily  . rosuvastatin  40 mg Oral QHS   Continuous Infusions: . sodium chloride    . heparin 1,600 Units/hr (06/06/18 0932)   PRN Meds:.acetaminophen, albuterol, ALPRAZolam, gi cocktail, ipratropium-albuterol, morphine injection, ondansetron (ZOFRAN)  IV    Anti-infectives (From admission, onward)   Start     Dose/Rate Route Frequency Ordered Stop   06/06/18 1000  acyclovir (ZOVIRAX) tablet 400 mg     400 mg Oral Daily 06/05/18 1844          Objective:   Vitals:   06/05/18 2127 06/05/18 2230 06/06/18 0228 06/06/18 0620  BP: (!) 113/57 (!) 158/60 (!) 166/60 (!) 182/65  Pulse: 91 69 60 64  Resp: 18 18 18 18   Temp: 98.6 F (37 C) 97.8 F (36.6 C) 97.9 F (36.6 C) 98.2 F (36.8 C)  TempSrc: Oral Oral Oral Oral  SpO2: 96% 98% 97% 98%  Weight:      Height:        Wt Readings from Last 3 Encounters:  06/05/18 125.6 kg  06/05/18 126.7 kg  02/25/18 120.8 kg     Intake/Output Summary (Last 24 hours) at 06/06/2018 1041 Last data filed at 06/06/2018 3557 Gross per 24 hour  Intake 923.4 ml  Output 1500 ml  Net -576.6 ml     Physical Exam Patient is examined daily including today on 06/06/18 , exams remain the same as of yesterday except that has changed   Gen:- Awake Alert, obese In no apparent distress  HEENT:- [place].AT, No sclera icterus Neck-Supple Neck,No JVD,.  Lungs-  CTAB , good air movement bilaterally CV- S1, S2 normal, 3/6 SM Abd-  +ve B.Sounds, Abd Soft, No tenderness, increased truncal adiposity    Extremity/Skin:-  Trace  edema,   good pulses Psych-affect is appropriate, oriented x3 Neuro-no new focal deficits, no tremors   Data Review:   Micro Results No results found for this or any previous visit (from the past 240 hour(s)).  Radiology Reports Dg Chest 2 View  Result Date: 06/05/2018 CLINICAL DATA:  Chest pain EXAM: CHEST - 2 VIEW COMPARISON:  12/01/2014 FINDINGS: There is no focal parenchymal opacity. There is no pleural effusion or pneumothorax. There is stable cardiomegaly. There is thoracic aortic atherosclerosis. The osseous structures are unremarkable. IMPRESSION: No active cardiopulmonary disease. Electronically Signed   By: Kathreen Devoid   On: 06/05/2018 15:50     CBC Recent Labs  Lab  06/05/18 1456 06/06/18 0538  WBC 12.5* 10.7*  HGB 10.1* 9.3*  HCT 33.1* 31.5*  PLT 257 239  MCV 80.9 81.4  MCH 24.7* 24.0*  MCHC 30.5 29.5*  RDW 16.5* 16.7*    Chemistries  Recent Labs  Lab 06/05/18 1456 06/06/18 0157  NA 139 140  K 4.0 4.3  CL 106 106  CO2 25 26  GLUCOSE 70 126*  BUN 20 21  CREATININE 0.97 1.04*  CALCIUM 9.1 8.6*   ------------------------------------------------------------------------------------------------------------------ No results for input(s): CHOL, HDL, LDLCALC, TRIG, CHOLHDL, LDLDIRECT in the last 72 hours.  Lab Results  Component Value Date   HGBA1C 6.6 11/14/2015   ------------------------------------------------------------------------------------------------------------------ Recent Labs    06/05/18 1505  TSH 1.822   ------------------------------------------------------------------------------------------------------------------ No results for input(s): VITAMINB12, FOLATE,  FERRITIN, TIBC, IRON, RETICCTPCT in the last 72 hours.  Coagulation profile No results for input(s): INR, PROTIME in the last 168 hours.  No results for input(s): DDIMER in the last 72 hours.  Cardiac Enzymes Recent Labs  Lab 06/05/18 1947 06/05/18 2304 06/06/18 0157  TROPONINI 0.25* 0.34* 0.35*   ------------------------------------------------------------------------------------------------------------------    Component Value Date/Time   BNP 197.0 (H) 06/05/2018 1526   Reo Portela M.D on 06/06/2018 at 10:41 AM  Pager---[phone] Go to [email or web address] - password [email or web address] for contact info  [facility] - Office  [phone]

## 2018-06-06 NOTE — Progress Notes (Signed)
*  PRELIMINARY RESULTS* Echocardiogram 2D Echocardiogram has been performed.  Leavy Cella 06/06/2018, 12:46 PM

## 2018-06-07 ENCOUNTER — Inpatient Hospital Stay (HOSPITAL_COMMUNITY): Payer: Medicare HMO

## 2018-06-07 DIAGNOSIS — E785 Hyperlipidemia, unspecified: Secondary | ICD-10-CM

## 2018-06-07 DIAGNOSIS — E119 Type 2 diabetes mellitus without complications: Secondary | ICD-10-CM

## 2018-06-07 DIAGNOSIS — I25110 Atherosclerotic heart disease of native coronary artery with unstable angina pectoris: Secondary | ICD-10-CM

## 2018-06-07 DIAGNOSIS — E1169 Type 2 diabetes mellitus with other specified complication: Secondary | ICD-10-CM

## 2018-06-07 DIAGNOSIS — I213 ST elevation (STEMI) myocardial infarction of unspecified site: Secondary | ICD-10-CM

## 2018-06-07 DIAGNOSIS — R748 Abnormal levels of other serum enzymes: Secondary | ICD-10-CM

## 2018-06-07 DIAGNOSIS — I214 Non-ST elevation (NSTEMI) myocardial infarction: Principal | ICD-10-CM

## 2018-06-07 DIAGNOSIS — R4182 Altered mental status, unspecified: Secondary | ICD-10-CM

## 2018-06-07 LAB — BASIC METABOLIC PANEL
ANION GAP: 9 (ref 5–15)
BUN: 16 mg/dL (ref 8–23)
CO2: 24 mmol/L (ref 22–32)
Calcium: 8.7 mg/dL — ABNORMAL LOW (ref 8.9–10.3)
Chloride: 105 mmol/L (ref 98–111)
Creatinine, Ser: 0.99 mg/dL (ref 0.44–1.00)
GFR calc Af Amer: >60 mL/min (ref >60)
GFR, EST NON AFRICAN AMERICAN: 58 mL/min — AB (ref >60)
Glucose, Bld: 260 mg/dL — ABNORMAL HIGH (ref 70–99)
POTASSIUM: 4.8 mmol/L (ref 3.5–5.1)
SODIUM: 138 mmol/L (ref 135–145)

## 2018-06-07 LAB — HIV ANTIBODY (ROUTINE TESTING W REFLEX): HIV Screen 4th Generation wRfx: NONREACTIVE

## 2018-06-07 LAB — CBC
HEMATOCRIT: 32.8 % — AB (ref 36.0–46.0)
HEMOGLOBIN: 9.7 g/dL — AB (ref 12.0–15.0)
MCH: 24.4 pg — AB (ref 26.0–34.0)
MCHC: 29.6 g/dL — ABNORMAL LOW (ref 30.0–36.0)
MCV: 82.4 fL (ref 78.0–100.0)
Platelets: 227 10*3/uL (ref 150–400)
RBC: 3.98 MIL/uL (ref 3.87–5.11)
RDW: 16.5 % — ABNORMAL HIGH (ref 11.5–15.5)
WBC: 10.7 10*3/uL — ABNORMAL HIGH (ref 4.0–10.5)

## 2018-06-07 LAB — GLUCOSE, CAPILLARY
GLUCOSE-CAPILLARY: 155 mg/dL — AB (ref 70–99)
GLUCOSE-CAPILLARY: 161 mg/dL — AB (ref 70–99)
GLUCOSE-CAPILLARY: 241 mg/dL — AB (ref 70–99)
Glucose-Capillary: 156 mg/dL — ABNORMAL HIGH (ref 70–99)
Glucose-Capillary: 192 mg/dL — ABNORMAL HIGH (ref 70–99)

## 2018-06-07 LAB — HEPARIN LEVEL (UNFRACTIONATED): HEPARIN UNFRACTIONATED: 0.26 [IU]/mL — AB (ref 0.30–0.70)

## 2018-06-07 NOTE — Progress Notes (Signed)
[place] for heparin Indication: ACS/STEMI  Allergies  Allergen Reactions  . Atorvastatin Other (See Comments)    Leg weakness.    Patient Measurements: Height: 5\' 5"  (165.1 cm) Weight: 269 lb 14.4 oz (122.4 kg) IBW/kg (Calculated) : 57 Heparin Dosing Weight: 88 kg  Vital Signs: Temp: 98.7 F (37.1 C) (09/21 0809) Temp Source: Oral (09/21 0809) BP: 156/74 (09/21 1400) Pulse Rate: 67 (09/21 1400)  Labs: Recent Labs    06/05/18 1456 06/05/18 1501 06/05/18 1947 06/05/18 2304 06/06/18 0157 06/06/18 0538 06/07/18 0311 06/07/18 0855  HGB 10.1*  --   --   --   --  9.3* 9.7*  --   HCT 33.1*  --   --   --   --  31.5* 32.8*  --   PLT 257  --   --   --   --  239 227  --   APTT  --  32  --   --   --   --   --   --   HEPARINUNFRC  --   --   --  <0.10*  --  0.29*  --  0.26*  CREATININE 0.97  --   --   --  1.04*  --  0.99  --   TROPONINI  --   --  0.25* 0.34* 0.35*  --   --   --     Estimated Creatinine Clearance: 73.4 mL/min (by C-G formula based on SCr of 0.99 mg/dL).   Medical History: Past Medical History:  Diagnosis Date  . Allergy   . Anxiety   . Asthma   . Diabetes mellitus without complication ([place])   . Gout   . Hyperlipidemia   . Hypertension   . Hypothyroid   . Neuropathy   . Obesity   . Vitamin D deficiency     Medications:  Medications Prior to Admission  Medication Sig Dispense Refill Last Dose  . acyclovir (ZOVIRAX) 400 MG tablet TAKE 1 TABLET EVERY DAY 90 tablet 0 06/05/2018 at Unknown time  . albuterol (PROVENTIL HFA;VENTOLIN HFA) 108 (90 Base) MCG/ACT inhaler Inhale 1-2 puffs into the lungs every 6 (six) hours as needed for wheezing or shortness of breath.   06/05/2018 at Unknown time  . ALPRAZolam (XANAX) 0.5 MG tablet TAKE 1 TABLET 2 TIMES A DAY AS NEEDED FOR ANXIETY 45 tablet 2 06/05/2018 at Unknown time  . amLODipine (NORVASC) 10 MG tablet TAKE 1 TABLET EVERY DAY 90 tablet 0 06/05/2018 at Unknown  time  . B-D UF III MINI PEN NEEDLES 31G X 5 MM MISC USE AS INSTRUCTED TO GIVE LANTUS INJECTION TWICE DAILY 180 each 1 06/05/2018 at Unknown time  . fluticasone (FLONASE) 50 MCG/ACT nasal spray USE 2 SPRAYS IN EACH NOSTRIL EVERY DAY 48 g 1 06/05/2018 at Unknown time  . furosemide (LASIX) 20 MG tablet TAKE 1 TABLET (20 MG TOTAL) BY MOUTH DAILY. 90 tablet 0 06/05/2018 at Unknown time  . HYDROcodone-acetaminophen (NORCO) 7.5-325 MG tablet Take 1 tablet by mouth at bedtime. 30 tablet 0 06/04/2018 at Unknown time  . Ipratropium-Albuterol (COMBIVENT RESPIMAT) 20-100 MCG/ACT AERS respimat Inhale 1 puff into the lungs every 6 (six) hours as needed for wheezing. 12 g 1 06/05/2018 at Unknown time  . LANTUS SOLOSTAR 100 UNIT/ML Solostar Pen INJECT 45 UNITS SUBCUTANEOUSLY 2 TIMES DAILY 90 mL 0 06/05/2018 at Unknown time  . levothyroxine (SYNTHROID, LEVOTHROID) 50 MCG tablet TAKE 1 TABLET EVERY DAY BEFORE BREAKFAST 90  tablet 3 06/05/2018 at Unknown time  . losartan (COZAAR) 100 MG tablet TAKE 1 TABLET EVERY DAY 90 tablet 0 06/05/2018 at Unknown time  . metoprolol succinate (TOPROL-XL) 25 MG 24 hr tablet Take 1 tablet (25 mg total) by mouth daily. 90 tablet 3 06/05/2018 at 0900  . montelukast (SINGULAIR) 10 MG tablet TAKE 1 TABLET (10 MG TOTAL) BY MOUTH AT BEDTIME. 90 tablet 1 06/04/2018 at Unknown time  . NOVOLOG FLEXPEN 100 UNIT/ML FlexPen INJECT 45 UNITS SUBCUTANEOUSLY THREE TIMES DAILY WITH MEALS 120 mL 1 06/05/2018 at Unknown time  . omeprazole (PRILOSEC) 40 MG capsule TAKE 1 CAPSULE EVERY DAY 90 capsule 0 06/05/2018 at Unknown time  . rosuvastatin (CRESTOR) 20 MG tablet Take 1 tablet (20 mg total) by mouth at bedtime. 30 tablet 11 06/04/2018 at Unknown time  . ACCU-CHEK SOFTCLIX LANCETS lancets Test BG TID 300 each 3 Taking    Assessment: Pharmacy consulted to dose heparin in patient for ACS/STEMI.  She is now s/p cath and possible CABG but needs to discuss with family. ASA, statin, NTG drip, amlo losartan BP stable, no  BB bradycardia 60s.  Heparin restarted post cath 1700 units/hr HL 0.26 < goal will increase rate.  CBC stable   Goal of Therapy:  Heparin level 0.3-0.7 units/ml Monitor platelets by anticoagulation protocol: Yes   Plan:  Increase heparin drip 1900 uts/hr  HL, CBC daily  Bonnita Nasuti Pharm.D. CPP, BCPS Clinical Pharmacist [phone] 06/07/2018 3:17 PM

## 2018-06-07 NOTE — Progress Notes (Signed)
[address]       [place] [address]             [phone]        Mary Raymond Medical Record #[id number] Date of Birth: September 11, 1951  Referring: Dr Claiborne Billings Primary Care: [name], FNP Primary Cardiologist:Branch, Roderic Palau, MD  Chief Complaint:    Chief Complaint  Patient presents with  . Chest Pain    History of Present Illness:     Patient is a 67 year old diabetic female who lives alone.  She tells me that 2 days ago she went to her primary care doctor to have her hemoglobin A1c checked and ended up admitted to [facility].  She was not sure why this occurred.  After admission her troponin was mildly elevated she was transferred to [facility] for cardiac catheterization.   It is very difficult to get any coherent history from the patient, she is very unsure of the events that have occurred.  She does note that she lives alone, her son and daughter get food for her, she does occasionally drive.  She notes she cannot stand for more than 2 to 3 minutes at a time.  She is been severely limited in her ability to get around for the past 10 or 12 years after "falling on the ice".  She was confined to a wheelchair in the past, currently she does get around the house on her own.   Notes in the chart relate that she had given a history of chest pain, though today she does not confirm this   Current Activity/ Functional Status: Patient will not be independent with mobility/ambulation, transfers, ADL's, IADL's.   Zubrod Score: At the time of surgery this patient's most appropriate activity status/level should be described as: []     0    Normal activity, no symptoms []     1    Restricted in physical strenuous activity but ambulatory, able to do out light work []     2    Ambulatory and capable of self care, unable to do work activities, up and about                 more than 50%  Of the time                            [x]     3    Only  limited self care, in bed greater than 50% of waking hours []     4    Completely disabled, no self care, confined to bed or chair []     5    Moribund  Past Medical History:  Diagnosis Date  . Allergy   . Anxiety   . Asthma   . Diabetes mellitus without complication ([place])   . Gout   . Hyperlipidemia   . Hypertension   . Hypothyroid   . Neuropathy   . Obesity   . Vitamin D deficiency     Past Surgical History:  Procedure Laterality Date  . ABDOMINAL HYSTERECTOMY    . BREAST LUMPECTOMY    . LEFT HEART CATH AND CORONARY ANGIOGRAPHY N/A 06/06/2018   Procedure: LEFT HEART CATH AND CORONARY ANGIOGRAPHY;  Surgeon: Troy Sine, MD;  Location: [facility];  Service: Cardiovascular;  Laterality: N/A;  . rectal fissure      Social History   Tobacco Use  Smoking Status Never Smoker  Smokeless Tobacco Never Used    Social History   Substance and Sexual Activity  Alcohol Use No     Allergies  Allergen Reactions  . Atorvastatin Other (See Comments)    Leg weakness.    Current Facility-Administered Medications  Medication Dose Route Frequency Provider Last Rate Last Dose  . 0.9 %  sodium chloride infusion  250 mL Intravenous PRN Shelva Majestic A, MD      . 0.9 %  sodium chloride infusion   Intravenous Continuous Denton Brick, Courage, MD 125 mL/hr at 06/07/18 0700    . acetaminophen (TYLENOL) tablet 650 mg  650 mg Oral Q4H PRN Johnson-Pitts, Endia, MD      . acyclovir (ZOVIRAX) tablet 400 mg  400 mg Oral Daily Johnson-Pitts, Endia, MD   400 mg at 06/06/18 0847  . albuterol (PROVENTIL) (2.5 MG/3ML) 0.083% nebulizer solution 3 mL  3 mL Inhalation Q6H PRN Johnson-Pitts, Endia, MD      . ALPRAZolam Duanne Moron) tablet 0.5 mg  0.5 mg Oral BID PRN Johnson-Pitts, Endia, MD   0.5 mg at 06/05/18 2129  . amLODipine (NORVASC) tablet 10 mg  10 mg Oral Daily Johnson-Pitts, Endia, MD   10 mg at 06/06/18 0848  . aspirin EC tablet 81 mg  81 mg Oral Daily Erma Heritage, PA-C   81 mg at  06/06/18 0849  . fluticasone (FLONASE) 50 MCG/ACT nasal spray 2 spray  2 spray Each Nare Daily Johnson-Pitts, Endia, MD   2 spray at 06/06/18 0849  . furosemide (LASIX) tablet 20 mg  20 mg Oral Daily Johnson-Pitts, Endia, MD      . gi cocktail (Maalox,Lidocaine,Donnatal)  30 mL Oral QID PRN Johnson-Pitts, Endia, MD      . heparin ADULT infusion 100 units/mL (25000 units/243mL sodium chloride 0.45%)  1,700 Units/hr Intravenous Continuous Emokpae, Courage, MD 17 mL/hr at 06/07/18 0700 1,700 Units/hr at 06/07/18 0700  . HYDROcodone-acetaminophen (NORCO) 7.5-325 MG per tablet 1 tablet  1 tablet Oral QHS Johnson-Pitts, Endia, MD   1 tablet at 06/07/18 0042  . insulin aspart (novoLOG) injection 0-5 Units  0-5 Units Subcutaneous QHS Roxan Hockey, MD   2 Units at 06/07/18 0047  . insulin aspart (novoLOG) injection 0-9 Units  0-9 Units Subcutaneous TID WC Roxan Hockey, MD   2 Units at 06/07/18 0756  . insulin aspart (novoLOG) injection 15 Units  15 Units Subcutaneous TID WC Roxan Hockey, MD   15 Units at 06/07/18 0755  . insulin glargine (LANTUS) injection 25 Units  25 Units Subcutaneous BID Emokpae, Courage, MD      . ipratropium-albuterol (DUONEB) 0.5-2.5 (3) MG/3ML nebulizer solution 3 mL  3 mL Inhalation Q6H PRN Johnson-Pitts, Endia, MD      . levothyroxine (SYNTHROID, LEVOTHROID) tablet 100 mcg  100 mcg Oral QAC breakfast Johnson-Pitts, Endia, MD   100 mcg at 06/07/18 0755  . losartan (COZAAR) tablet 100 mg  100 mg Oral Daily Johnson-Pitts, Endia, MD   100 mg at 06/06/18 0847  . montelukast (SINGULAIR) tablet 10 mg  10 mg Oral QHS Johnson-Pitts, Endia, MD   10 mg at 06/07/18 0043  . morphine 2 MG/ML injection 2 mg  2 mg Intravenous Q2H PRN Johnson-Pitts, Endia, MD   2 mg at 06/06/18 2243  . nitroGLYCERIN 50 mg in dextrose 5 % 250 mL (0.2 mg/mL) infusion  0-200 mcg/min Intravenous Titrated Troy Sine, MD 3 mL/hr at 06/07/18 0700 10 mcg/min at 06/07/18 0700  . ondansetron (ZOFRAN)  injection 4 mg  4 mg Intravenous Q6H PRN Johnson-Pitts, Endia, MD      . pantoprazole (PROTONIX) EC tablet 80 mg  80 mg Oral Daily Johnson-Pitts, Endia, MD   80 mg at 06/06/18 0849  . rosuvastatin (CRESTOR) tablet 40 mg  40 mg Oral QHS Erma Heritage, PA-C   40 mg at 06/07/18 0044  . sodium chloride flush (NS) 0.9 % injection 3 mL  3 mL Intravenous Q12H Troy Sine, MD   3 mL at 06/06/18 1950  . sodium chloride flush (NS) 0.9 % injection 3 mL  3 mL Intravenous PRN Troy Sine, MD        Medications Prior to Admission  Medication Sig Dispense Refill Last Dose  . acyclovir (ZOVIRAX) 400 MG tablet TAKE 1 TABLET EVERY DAY 90 tablet 0 06/05/2018 at Unknown time  . albuterol (PROVENTIL HFA;VENTOLIN HFA) 108 (90 Base) MCG/ACT inhaler Inhale 1-2 puffs into the lungs every 6 (six) hours as needed for wheezing or shortness of breath.   06/05/2018 at Unknown time  . ALPRAZolam (XANAX) 0.5 MG tablet TAKE 1 TABLET 2 TIMES A DAY AS NEEDED FOR ANXIETY 45 tablet 2 06/05/2018 at Unknown time  . amLODipine (NORVASC) 10 MG tablet TAKE 1 TABLET EVERY DAY 90 tablet 0 06/05/2018 at Unknown time  . B-D UF III MINI PEN NEEDLES 31G X 5 MM MISC USE AS INSTRUCTED TO GIVE LANTUS INJECTION TWICE DAILY 180 each 1 06/05/2018 at Unknown time  . fluticasone (FLONASE) 50 MCG/ACT nasal spray USE 2 SPRAYS IN EACH NOSTRIL EVERY DAY 48 g 1 06/05/2018 at Unknown time  . furosemide (LASIX) 20 MG tablet TAKE 1 TABLET (20 MG TOTAL) BY MOUTH DAILY. 90 tablet 0 06/05/2018 at Unknown time  . HYDROcodone-acetaminophen (NORCO) 7.5-325 MG tablet Take 1 tablet by mouth at bedtime. 30 tablet 0 06/04/2018 at Unknown time  . Ipratropium-Albuterol (COMBIVENT RESPIMAT) 20-100 MCG/ACT AERS respimat Inhale 1 puff into the lungs every 6 (six) hours as needed for wheezing. 12 g 1 06/05/2018 at Unknown time  . LANTUS SOLOSTAR 100 UNIT/ML Solostar Pen INJECT 45 UNITS SUBCUTANEOUSLY 2 TIMES DAILY 90 mL 0 06/05/2018 at Unknown time  . levothyroxine  (SYNTHROID, LEVOTHROID) 50 MCG tablet TAKE 1 TABLET EVERY DAY BEFORE BREAKFAST 90 tablet 3 06/05/2018 at Unknown time  . losartan (COZAAR) 100 MG tablet TAKE 1 TABLET EVERY DAY 90 tablet 0 06/05/2018 at Unknown time  . metoprolol succinate (TOPROL-XL) 25 MG 24 hr tablet Take 1 tablet (25 mg total) by mouth daily. 90 tablet 3 06/05/2018 at 0900  . montelukast (SINGULAIR) 10 MG tablet TAKE 1 TABLET (10 MG TOTAL) BY MOUTH AT BEDTIME. 90 tablet 1 06/04/2018 at Unknown time  . NOVOLOG FLEXPEN 100 UNIT/ML FlexPen INJECT 45 UNITS SUBCUTANEOUSLY THREE TIMES DAILY WITH MEALS 120 mL 1 06/05/2018 at Unknown time  . omeprazole (PRILOSEC) 40 MG capsule TAKE 1 CAPSULE EVERY DAY 90 capsule 0 06/05/2018 at Unknown time  . rosuvastatin (CRESTOR) 20 MG tablet Take 1 tablet (20 mg total) by mouth at bedtime. 30 tablet 11 06/04/2018 at Unknown time  . ACCU-CHEK SOFTCLIX LANCETS lancets Test BG TID 300 each 3 Taking    Family History  Problem Relation Age of Onset  . Cancer Mother        colon  . Diabetes Mother   . Hypertension Mother   . Dementia Mother   . Heart disease Father   . Asthma Sister   . Cancer Sister  breast  . Asthma Sister   . Scoliosis Sister   . Diabetes Sister   . Asthma Sister      Review of Systems:   Pertinent items are noted in HPI.     Cardiac Review of Systems: Y or  [    ]= no  Chest Pain [  y  ]  Resting SOB [ y  ] Exertional SOB  Blue.Reese  ]  Orthopnea [ n ]   Pedal Edema [ n  ]    Palpitations [ y ] Syncope  [n  ]   Presyncope y  ]  General Review of Systems: [Y] = yes [  ]=no Constitional: recent weight change [  ]; anorexia [  ]; fatigue [ y ]; nausea [  ]; night sweats [  ]; fever [  ]; or chills [  ]                                                               Dental: Last Dentist visit:   Eye : blurred vision [  ]; diplopia [   ]; vision changes [  ];  Amaurosis fugax[  ]; Resp: cough [  ];  wheezing[  ];  hemoptysis[  ]; shortness of breath[  ]; paroxysmal nocturnal  dyspnea[  ]; dyspnea on exertion[  ]; or orthopnea[  ];  GI:  gallstones[  ], vomiting[  ];  dysphagia[  ]; melena[  ];  hematochezia [  ]; heartburn[  ];   Hx of  Colonoscopy[y 8 years   ]; GU: kidney stones [  ]; hematuria[  ];   dysuria [  ];  nocturia[  ];  history of     obstruction [  ]; urinary frequency [  ]             Skin: rash, swelling[  ];, hair loss[  ];  peripheral edema[  ];  or itching[  ]; Musculosketetal: myalgias[  ];  joint swelling[  ];  joint erythema[  ];  joint pain[  ];  back pain[  ];  Heme/Lymph: bruising[  ];  bleeding[  ];  anemia[  ];  Neuro: TIA[  ];  headaches[  ];  stroke[  ];  vertigo[  ];  seizures[  ];   paresthesias[  ];  difficulty walking[y  ];  Psych:depression[  ]; anxiety[  ];  Endocrine: diabetes[ y ];  thyroid dysfunction[  ];              Physical Exam: BP (!) 165/79   Pulse 85   Temp 98.7 F (37.1 C) (Oral)   Resp 18   Ht 5\' 5"  (1.651 m)   Wt 122.4 kg   SpO2 96%   BMI 44.91 kg/m    General appearance: alert, cooperative, appears older than stated age, morbidly obese and slowed mentation Head: Normocephalic, without obvious abnormality, atraumatic Neck: no adenopathy, no carotid bruit, no JVD, supple, symmetrical, trachea midline and thyroid not enlarged, symmetric, no tenderness/mass/nodules Lymph nodes: Cervical, supraclavicular, and axillary nodes normal. Resp: diminished breath sounds bibasilar Back: symmetric, no curvature. ROM normal. No CVA tenderness. Cardio: regular rate and rhythm, S1, S2 normal, no murmur, click, rub or gallop GI: soft, non-tender; bowel sounds normal; no masses,  no organomegaly Extremities: edema both lower exxtremities  Neurologic: Grossly normal  Diagnostic Studies & Laboratory data:     Recent Radiology Findings:   Dg Chest 2 View  Result Date: 06/05/2018 CLINICAL DATA:  Chest pain EXAM: CHEST - 2 VIEW COMPARISON:  12/01/2014 FINDINGS: There is no focal parenchymal opacity. There is no pleural  effusion or pneumothorax. There is stable cardiomegaly. There is thoracic aortic atherosclerosis. The osseous structures are unremarkable. IMPRESSION: No active cardiopulmonary disease. Electronically Signed   By: Kathreen Devoid   On: 06/05/2018 15:50     I have independently reviewed the above radiologic studies and discussed with the patient   Recent Lab Findings: Lab Results  Component Value Date   WBC 10.7 (H) 06/07/2018   HGB 9.7 (L) 06/07/2018   HCT 32.8 (L) 06/07/2018   PLT 227 06/07/2018   GLUCOSE 260 (H) 06/07/2018   CHOL 201 (H) 02/25/2018   TRIG 207 (H) 02/25/2018   HDL 42 02/25/2018   LDLCALC 118 (H) 02/25/2018   ALT 9 02/25/2018   AST 12 02/25/2018   NA 138 06/07/2018   K 4.8 06/07/2018   CL 105 06/07/2018   CREATININE 0.99 06/07/2018   BUN 16 06/07/2018   CO2 24 06/07/2018   TSH 1.822 06/05/2018   HGBA1C 7.3 (H) 06/05/2018   Lab Results  Component Value Date   TROPONINI 0.35 ([place]) 06/06/2018   Cath 9/20 5pm         Prox RCA to Mid RCA lesion is 90% stenosed.  Dist RCA lesion is 80% stenosed.  Mid RCA lesion is 80% stenosed.  Acute Mrg lesion is 40% stenosed.  Mid LM lesion is 55% stenosed.  Ost Cx lesion is 90% stenosed.  Prox Cx lesion is 85% stenosed.  Ost LAD to Prox LAD lesion is 50% stenosed.   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 There is  55% focal distal left main stenosis, calcification of the proximal LAD with 50% stenosis, 90% ostial left circumflex stenosis followed by 85% calcified proximal stenosis; and a large dominant RCA with severe eccentric calcification with 90% stenosis proximally, 80% stenosis in the region of the crux and 80% stenosis prior to the PDA takeoff with 40% marginal/PDA branch stenosis.  Preserved global LV contractility with inferior hypocontractility and ejection fraction of approximately 55%.  LVEDP 17 mm Hg.    RECOMMENDATION: Surgical consultation for CABG revascularization.  The  patient will be started on heparin therapy 8 hours post sheath removal.  She will also be started on IV nitroglycerin.  I have discussed the catheterization findings with Dr. Servando Snare.  She has been documented to have intermittent second-degree type I block. She will transported to [facility] for further evaluation and treatment prior to subsequent CABG revascularization. Recommend Aspirin 81mg  daily and will not start DAPT  due to need for surgery AO Systolic Pressure 559 mmHg  AO Diastolic Pressure 73 mmHg  AO Mean 741 mmHg  LV Systolic Pressure 638 mmHg  LV Diastolic Pressure 7 mmHg  LV EDP 17 mmHg  AOp Systolic Pressure 453 mmHg  AOp Diastolic Pressure 76 mmHg  AOp Mean Pressure 646 mmHg  LVp Systolic Pressure 803 mmHg  LVp Diastolic Pressure 10 mmHg  LVp EDP Pressure 22 mmHg   I have independently reviewed the above radiology studies  and reviewed the findings with the patient.    ECHO: Patient:    Mary Raymond, Mary Raymond MR #:       [id number] Study Date: 06/06/2018 Gender:  F Age:        60 Height:     165.1 cm Weight:     125.6 kg BSA:        2.47 m^2 Pt. Status: Room:       6E22C   SONOGRAPHER  Leavy Cella  PERFORMING   [facility], [place]  ATTENDING    Emokpae, Courage  ADMITTING    Johnson-Pitts, Endia  ORDERING     Johnson-Pitts, Endia  REFERRING    Johnson-Pitts, Endia  cc:  ------------------------------------------------------------------- LV EF: 60% -   65%  ------------------------------------------------------------------- Indications:      Chest pain 786.51.  ------------------------------------------------------------------- History:   PMH:  Elevated Troponin.  Risk factors:  Hypertension. Dyslipidemia.  ------------------------------------------------------------------- Study Conclusions  - Left ventricle: The cavity size was normal. Wall thickness was   increased in a pattern of moderate LVH. Systolic function was   normal. The estimated  ejection fraction was in the range of 60%   to 65%. Wall motion was normal; there were no regional wall   motion abnormalities. Indeterminate diastolic function. - Aortic valve: Moderately calcified annulus. Trileaflet; mildly   calcified leaflets. - Mitral valve: Moderately calcified annulus. There was mild   regurgitation. - Left atrium: The atrium was mildly dilated. - Right atrium: Central venous pressure (est): 15 mm Hg. - Atrial septum: No defect or patent foramen ovale was identified. - Tricuspid valve: There was trivial regurgitation. - Pulmonary arteries: Systolic pressure could not be accurately   estimated. - Pericardium, extracardiac: A prominent pericardial fat pad was   present.  ------------------------------------------------------------------- Study data:  No prior study was available for comparison.  Study status:  Routine.  Procedure:  Transthoracic echocardiography. Image quality was adequate.  Study completion:  There were no complications.          Transthoracic echocardiography.  M-mode, complete 2D, spectral Doppler, and color Doppler.  Birthdate: Patient birthdate: [birth year].  Age:  Patient is 67 yr old.  Sex: Gender: female.    BMI: 46.1 kg/m^2.  Blood pressure:     182/65 Patient status:  Inpatient.  Study date:  Study date: 06/06/2018. Study time: 11:42 AM.  Location:  Bedside.  -------------------------------------------------------------------  ------------------------------------------------------------------- Left ventricle:  The cavity size was normal. Wall thickness was increased in a pattern of moderate LVH. Systolic function was normal. The estimated ejection fraction was in the range of 60% to 65%. Wall motion was normal; there were no regional wall motion abnormalities. Indeterminate diastolic function.  ------------------------------------------------------------------- Aortic valve:   Moderately calcified annulus. Trileaflet;  mildly calcified leaflets.  Doppler:  There was no significant regurgitation.  ------------------------------------------------------------------- Aorta:  Aortic root: The aortic root was normal in size.  ------------------------------------------------------------------- Mitral valve:   Moderately calcified annulus.  Doppler:  There was mild regurgitation.    Peak gradient (D): 8 mm Hg.  ------------------------------------------------------------------- Left atrium:  The atrium was mildly dilated.  ------------------------------------------------------------------- Atrial septum:  No defect or patent foramen ovale was identified.   ------------------------------------------------------------------- Right ventricle:  The cavity size was normal. Systolic function was normal.  ------------------------------------------------------------------- Pulmonic valve:    The valve appears to be grossly normal. Doppler:  There was trivial regurgitation.  ------------------------------------------------------------------- Tricuspid valve:   The valve appears to be grossly normal. Doppler:  There was trivial regurgitation.  ------------------------------------------------------------------- Pulmonary artery:    Systolic pressure could not be accurately estimated.  ------------------------------------------------------------------- Right atrium:  The atrium was normal in size.  ------------------------------------------------------------------- Pericardium:  A prominent pericardial fat pad was present. There was  no pericardial effusion.  ------------------------------------------------------------------- Systemic veins: Inferior vena cava: The vessel was dilated. The respirophasic diameter changes were blunted (< 50%), consistent with elevated central venous pressure.  ------------------------------------------------------------------- Measurements   Left ventricle                          Value        Reference  LV ID, ED, PLAX chordal                46.8  mm     43 - 52  LV ID, ES, PLAX chordal                25.4  mm     23 - 38  LV fx shortening, PLAX chordal         46    %      >=29  LV PW thickness, ED                    14.7  mm     ----------  IVS/LV PW ratio, ED                    0.9          <=1.3  LV e&', lateral                         5.98  cm/s   ----------  LV E/e&', lateral                       23.08        ----------  LV e&', medial                          7.29  cm/s   ----------  LV E/e&', medial                        18.93        ----------  LV e&', average                         6.64  cm/s   ----------  LV E/e&', average                       20.8         ----------    Ventricular septum                     Value        Reference  IVS thickness, ED                      13.2  mm     ----------    LVOT                                   Value        Reference  LVOT ID, S                             18    mm     ----------  LVOT area  2.54  cm^2   ----------    Aorta                                  Value        Reference  Aortic root ID, ED                     27    mm     ----------    Left atrium                            Value        Reference  LA ID, A-P, ES                         49    mm     ----------  LA ID/bsa, A-P                         1.98  cm/m^2 <=2.2  LA volume, S                           83.4  ml     ----------  LA volume/bsa, S                       33.7  ml/m^2 ----------  LA volume, ES, 1-p A4C                 71.1  ml     ----------  LA volume/bsa, ES, 1-p A4C             28.8  ml/m^2 ----------  LA volume, ES, 1-p A2C                 80    ml     ----------  LA volume/bsa, ES, 1-p A2C             32.4  ml/m^2 ----------    Mitral valve                           Value        Reference  Mitral E-wave peak velocity            138   cm/s   ----------  Mitral A-wave peak  velocity            129   cm/s   ----------  Mitral deceleration time               201   ms     150 - 230  Mitral peak gradient, D                8     mm Hg  ----------  Mitral E/A ratio, peak                 1.1          ----------    Right atrium                           Value        Reference  RA ID, S-I, ES, A4C  47.6  mm     34 - 49  RA area, ES, A4C                       12    cm^2   8.3 - 19.5  RA volume, ES, A/L                     24.2  ml     ----------  RA volume/bsa, ES, A/L                 9.8   ml/m^2 ----------    Systemic veins                         Value        Reference  Estimated CVP                          15    mm Hg  ----------    Right ventricle                        Value        Reference  TAPSE                                  26.5  mm     ----------  RV s&', lateral, S                      11.2  cm/s   ----------  Legend: (L)  and  (H)  mark values outside specified reference range.  ------------------------------------------------------------------- Prepared and Electronically Authenticated by  Rozann Lesches, M.D. 2019-09-20T15:41:20  Assessment / Plan:   1/ Chest pain with mildly elevated troponin-cardiac catheterization last evening shows severe three-vessel coronary artery disease as noted above  2 /severely deconditioned-patient notes that she is only able to stand 2 or 3 minutes at a time, notes that she spends most of her time in the bed or chair.  Her son and daughter do her shopping for her, she currently lives alone  3/ Anemia on admission- unknown cause HAS BEEN CHRONIC PAST SEVERAL YEARS -Hgb usually around 10 patient is currently around her baseline, long-standing history of normocytic, hypochromic anemia for the last 3 years, last colonoscopy more than 8 years   4/poorly controlled insulin-dependent diabetic hemoglobin A1c 8.3 most recently  The patient's cath films have been reviewed, she has significant  three-vessel disease and from an anatomic point would benefit from coronary artery bypass grafting.  Currently none of her family is available, she is having difficulty relating her history, but appears has severe deconditioning and very low functional status.  Before planning coronary artery bypass grafting these issues will need to be resolved and gain a better understanding of her what her functional status is after talking to her family.  If any intervention is undertaken she will require SNF placement postoperatively.     Mary Isaac MD      [address] [place] [address] Office [phone]   Beeper [phone]  06/07/2018 8:13 AM     Patient ID: Mary Raymond, female   DOB: 06-12-1951, 67 y.o.   MRN: [id number]

## 2018-06-07 NOTE — Progress Notes (Signed)
Patient ID: Mary Raymond, female   DOB: 1951-02-22, 67 y.o.   MRN: [id number] EVENING ROUNDS NOTE :     [address]       [place] [address]             [phone]                 1 Day Post-Op Procedure(s) (LRB): LEFT HEART CATH AND CORONARY ANGIOGRAPHY (N/A)  Total Length of Stay:  LOS: 1 day  BP (!) 149/72   Pulse 61   Temp 98.7 F (37.1 C) (Oral)   Resp 18   Ht 5\' 5"  (1.651 m)   Wt 122.4 kg   SpO2 96%   BMI 44.91 kg/m   .Intake/Output      09/21 0701 - 09/22 0700   P.O. 480   I.V. (mL/kg) 535.1 (4.4)   Total Intake(mL/kg) 1015.1 (8.3)   Urine (mL/kg/hr) 375 (0.3)   Total Output 375   Net +640.1         . sodium chloride    . heparin 1,900 Units/hr (06/07/18 1617)  . nitroGLYCERIN 15 mcg/min (06/07/18 1130)     Lab Results  Component Value Date   WBC 10.7 (H) 06/07/2018   HGB 9.7 (L) 06/07/2018   HCT 32.8 (L) 06/07/2018   PLT 227 06/07/2018   GLUCOSE 260 (H) 06/07/2018   CHOL 201 (H) 02/25/2018   TRIG 207 (H) 02/25/2018   HDL 42 02/25/2018   LDLCALC 118 (H) 02/25/2018   ALT 9 02/25/2018   AST 12 02/25/2018   NA 138 06/07/2018   K 4.8 06/07/2018   CL 105 06/07/2018   CREATININE 0.99 06/07/2018   BUN 16 06/07/2018   CO2 24 06/07/2018   TSH 1.822 06/05/2018   HGBA1C 7.3 (H) 06/05/2018   MICROALBUR 100 11/15/2014   Complaining of head ache this am, ct head negative,  Patient daughter and sister came today and discussed with them the patient current living conditions They confirm patient very poor functional status, patient spends more then 80% of time sitting in chair , can only walk 1-2 min. Family notes that has been this way for 5 years or more .  Cardiology will need to review and consider alternative treatments, CABG is best option for heart issue but here over all condition  Will prevent functional recovery   Grace Isaac MD  [place] Office [phone] 06/07/2018 7:07 PM

## 2018-06-07 NOTE — Progress Notes (Signed)
Progress Note  Patient Name: Mary Raymond Date of Encounter: 06/07/2018  Primary Cardiologist: Carlyle Dolly, MD   Subjective   She reports feeling very confused. Just can't put everything together. Denies any chest pain or dyspnea currently. Denies any specific palpitations, orthopnea, or PND.   Inpatient Medications    Scheduled Meds: . acyclovir  400 mg Oral Daily  . amLODipine  10 mg Oral Daily  . aspirin EC  81 mg Oral Daily  . fluticasone  2 spray Each Nare Daily  . furosemide  20 mg Oral Daily  . HYDROcodone-acetaminophen  1 tablet Oral QHS  . insulin aspart  0-5 Units Subcutaneous QHS  . insulin aspart  0-9 Units Subcutaneous TID WC  . insulin aspart  15 Units Subcutaneous TID WC  . insulin glargine  25 Units Subcutaneous BID  . levothyroxine  100 mcg Oral QAC breakfast  . losartan  100 mg Oral Daily  . montelukast  10 mg Oral QHS  . pantoprazole  80 mg Oral Daily  . rosuvastatin  40 mg Oral QHS  . sodium chloride flush  3 mL Intravenous Q12H   Continuous Infusions: . sodium chloride    . heparin 1,700 Units/hr (06/07/18 0700)  . nitroGLYCERIN 15 mcg/min (06/07/18 1030)   PRN Meds: sodium chloride, acetaminophen, albuterol, ALPRAZolam, gi cocktail, ipratropium-albuterol, morphine injection, ondansetron (ZOFRAN) IV, sodium chloride flush   Vital Signs    Vitals:   06/07/18 0809 06/07/18 0900 06/07/18 1000 06/07/18 1030  BP:  (!) 163/63 (!) 160/57 (!) 95/48  Pulse:  64  (!) 43  Resp:  17  (!) 21  Temp: 98.7 F (37.1 C)     TempSrc: Oral     SpO2:  97%  94%  Weight:      Height:        Intake/Output Summary (Last 24 hours) at 06/07/2018 1054 Last data filed at 06/07/2018 0939 Gross per 24 hour  Intake 1609.64 ml  Output 2250 ml  Net -640.36 ml   Filed Weights   06/05/18 1454 06/05/18 1835 06/06/18 1513  Weight: 126.7 kg 125.6 kg 122.4 kg    Telemetry    Sinus bradycardia, Mobitz type 1 AV block.  - Personally Reviewed  ECG    None  today.  Physical Exam   General: Well developed, obese Caucasian female appearing in no acute distress. Head: Normocephalic, atraumatic.  Neck: Supple without bruits, JVD difficul to assess secondary to body habitus. Lungs:  Resp regular and unlabored, CTA without wheezing or rales Heart: Regular rhythm, bradycardiac rate, S1, S2, no S3, S4, 2/6 SEM along RUSB; no rub. Abdomen: Soft, non-tender, non-distended with normoactive bowel sounds. No hepatomegaly. No rebound/guarding. No obvious abdominal masses. Extremities: No clubbing or cyanosis, trace lower extremity edema bilaterally. SCD's in place.  Distal pedal pulses are 2+ bilaterally. Neuro: Alert and oriented X 3. Moves all extremities spontaneously. Psych: Normal affect.  Labs    Chemistry Recent Labs  Lab 06/05/18 1456 06/06/18 0157 06/07/18 0311  NA 139 140 138  K 4.0 4.3 4.8  CL 106 106 105  CO2 25 26 24   GLUCOSE 70 126* 260*  BUN 20 21 16   CREATININE 0.97 1.04* 0.99  CALCIUM 9.1 8.6* 8.7*  GFRNONAA 60* 55* 58*  GFRAA >60 >60 >60  ANIONGAP 8 8 9      Hematology Recent Labs  Lab 06/05/18 1456 06/06/18 0538 06/07/18 0311  WBC 12.5* 10.7* 10.7*  RBC 4.09 3.87 3.98  HGB 10.1* 9.3* 9.7*  HCT 33.1* 31.5* 32.8*  MCV 80.9 81.4 82.4  MCH 24.7* 24.0* 24.4*  MCHC 30.5 29.5* 29.6*  RDW 16.5* 16.7* 16.5*  PLT 257 239 227    Cardiac Enzymes Recent Labs  Lab 06/05/18 1947 06/05/18 2304 06/06/18 0157  TROPONINI 0.25* 0.34* 0.35*    Recent Labs  Lab 06/05/18 1505  TROPIPOC 0.11*     BNP Recent Labs  Lab 06/05/18 1526  BNP 197.0*     DDimer No results for input(s): DDIMER in the last 168 hours.   Radiology    Dg Chest 2 View  Result Date: 06/05/2018 CLINICAL DATA:  Chest pain EXAM: CHEST - 2 VIEW COMPARISON:  12/01/2014 FINDINGS: There is no focal parenchymal opacity. There is no pleural effusion or pneumothorax. There is stable cardiomegaly. There is thoracic aortic atherosclerosis. The osseous  structures are unremarkable. IMPRESSION: No active cardiopulmonary disease. Electronically Signed   By: Kathreen Devoid   On: 06/05/2018 15:50    Cardiac Studies   Echocardiogram: Study Conclusions  - Left ventricle: The cavity size was normal. Wall thickness was   increased in a pattern of moderate LVH. Systolic function was   normal. The estimated ejection fraction was in the range of 60%   to 65%. Wall motion was normal; there were no regional wall   motion abnormalities. Indeterminate diastolic function. - Aortic valve: Moderately calcified annulus. Trileaflet; mildly   calcified leaflets. - Mitral valve: Moderately calcified annulus. There was mild   regurgitation. - Left atrium: The atrium was mildly dilated. - Right atrium: Central venous pressure (est): 15 mm Hg. - Atrial septum: No defect or patent foramen ovale was identified. - Tricuspid valve: There was trivial regurgitation. - Pulmonary arteries: Systolic pressure could not be accurately   estimated. - Pericardium, extracardiac: A prominent pericardial fat pad was   present.  Procedures   LEFT HEART CATH AND CORONARY ANGIOGRAPHY  Conclusion     Prox RCA to Mid RCA lesion is 90% stenosed.  Dist RCA lesion is 80% stenosed.  Mid RCA lesion is 80% stenosed.  Acute Mrg lesion is 40% stenosed.  Mid LM lesion is 55% stenosed.  Ost Cx lesion is 90% stenosed.  Prox Cx lesion is 85% stenosed.  Ost LAD to Prox LAD lesion is 50% stenosed.   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 There is  55% focal distal left main stenosis, calcification of the proximal LAD with 50% stenosis, 90% ostial left circumflex stenosis followed by 85% calcified proximal stenosis; and a large dominant RCA with severe eccentric calcification with 90% stenosis proximally, 80% stenosis in the region of the crux and 80% stenosis prior to the PDA takeoff with 40% marginal/PDA branch stenosis.  Preserved global LV  contractility with inferior hypocontractility and ejection fraction of approximately 55%.  LVEDP 17 mm Hg.    RECOMMENDATION: Surgical consultation for CABG revascularization.  The patient will be started on heparin therapy 8 hours post sheath removal.  She will also be started on IV nitroglycerin.  I have discussed the catheterization findings with Dr. Servando Snare.  She has been documented to have intermittent second-degree type I block. She will transported to [facility] for further evaluation and treatment prior to subsequent CABG revascularization. Recommend Aspirin 81mg  daily and will not start DAPT  due to need for surgery.      Patient Profile     67 y.o. female with past medical history of HTN, HLD, and type II DM who presented to [facility], ED on  06/05/2018 for evaluation of chest pain. Troponin value elevated to 0.34.  Assessment & Plan    1. NSTEMI - She presented for evaluation of chest pain which by initial history has mixed symptoms as pain has been occurring intermittently over the past month and can last for minutes up to hours and is sometimes worse with positional changes. Cyclic troponin values have continued to trend upwards from 0.11 to 0.25, and 0.34. She has been started on IV Heparin.  - By cardiac cath she has severe 3 vessel CAD. CT surgery consulted and Dr. Everrett Coombe note appreciated. Need further family input concerning patient's baseline status. She has a hard time providing more detailed information with confusion.  - continue Heparin, ASA, and statin therapy. BB held given bradycardia. - from a standpoint of PCI options she would be very high risk. Could treat the 2 lesions in the RCA with atherectomy and stenting. Treating the ostial LCx would require unprotected atherectomy and stenting of the ostial LCx into the left main.  - if she is a surgical candidate this would be the best option based on her anatomy and age.    2. 2nd Degree AV Block, Type 1  - HR has been  variable from the mid-30's to 50's on telemetry with intermittent Wenckebach.  - hold any rate slowing medication and observe  3. HTN - BP has been variable from  since admission. She has been continued on PTA Losartan and Amlodipine. Toprol-XL held given episodes of bradycardia as outlined above. Continue to follow this admission.   4. HLD - FLP from 02/2018 showed total cholesterol 201, triglycerides 207, LDL 118 and HDL 42. She is now on Crestor 40mg  daily.   5. IDDM - Hgb A1c at 8.3 in 02/2018. Repeat pending. Metformin held on admission. On SSI  - medication adjustment per admitting team.   6. Chronic Anemia - Baseline Hgb ~ 10.0 dating back over the past few years. Stable at 10.1 on admission. She denies any evidence of active bleeding.   7. Deconditioning - May need to go to SNF post discharge for Rehab therapies  8. Murmur - she does have a SEM along the RUSB. Echocardiogram is shows AV sclerosis without stenosis.   For questions or updates, please contact [place] Please consult [email or web address] for contact info under Cardiology/STEMI.   Signed, Hayven Croy [place], [place] 10:54 AM 06/07/2018

## 2018-06-07 NOTE — Progress Notes (Signed)
PROGRESS NOTE  SHELDON AMARA BSW:[id number] DOB: 1951/06/21 DOA: 06/05/2018 [name], FNP  HPI/Brief Narrative  Mary Raymond is a 67 y.o. year old female with medical history significant for morbid obesity, HTN, HLD and diabetes who presented on 06/05/2018 with chest pain and elevated troponin 0 0.25 and was found to have an STEMI with heart cath on 9/20 showing severe three-vessel disease.  Subjective Able to answer most questions appropriately still having some difficulty finding her words.  Somewhat confused  Assessment/Plan:  NSTEMI, stable.  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  Cardiology and cardiothoracic surgery following patient likely CABG candidate depending on functional status.  Hold off on DAPT for now, continue IV heparin.  Nitro drip started after heart cath  Second-degree AV block, type I, stable.  Heart rate range 40-60.  Monitoring on telemetry  Hypertension, not at goal, improving.  On home losartan and amlodipine.  Holding PTA Toprol-XL given above bradycardia.  Altered mental status, acute on chronic.  Family reports at baseline lucid with frequent intervals of confusion and odd behavior for multiple years.  Currently having word finding difficulties on exam.  CT head shows no acute changes.  Avoid sedating/BEERS criteria medications.  Possible element of delirium. continue precautions, if worsens will consult neurology  Chronic anemia.  Hemoglobin stable at 9.  Monitor CBC.  Type 2 diabetes, A1c on 05/2018-7.3%.  Home Lantus regimen decreased from 45 units twice daily to 25 units with good blood glucose control.  Monitor CBGs, 15 units NovoLog scheduled with meals, sliding scale.  Hyperlipidemia.  On Crestor 40  Hypothyroidism, stable.  TSH 1.8.  Continue home levothyroxine 100 mcg daily   Code Status: Full code  Family Communication: Sister and her husband at bedside updated on plan  Disposition Plan: Discussion  amongst family between cardiology and cardiothoracic surgery regarding next steps for severe three-vessel disease, ensure return to establish neurologic baseline.   Consultants:   Treatment Team:   Arnoldo Lenis, MD  [facility], Michae Kava, MD  Grace Isaac, MD    Procedures:  [facility] 9/20  TTE   Antimicrobials: Anti-infectives (From admission, onward)   Start     Dose/Rate Route Frequency Ordered Stop   06/06/18 1000  acyclovir (ZOVIRAX) tablet 400 mg     400 mg Oral Daily 06/05/18 1844           Cultures:  None  Telemetry: Yes  DVT prophylaxis: IV heparin   Objective: Vitals:   06/07/18 1300 06/07/18 1330 06/07/18 1400 06/07/18 1500  BP: (!) 162/72 (!) 169/62 (!) 156/74 (!) 135/56  Pulse: (!) 55 71 67 (!) 48  Resp: (!) 23 (!) 27 (!) 24 (!) 22  Temp:      TempSrc:      SpO2: 94% 95% 95% (!) 89%  Weight:      Height:        Intake/Output Summary (Last 24 hours) at 06/07/2018 1825 Last data filed at 06/07/2018 1513 Gross per 24 hour  Intake 1820.38 ml  Output 1225 ml  Net 595.38 ml   Filed Weights   06/05/18 1454 06/05/18 1835 06/06/18 1513  Weight: 126.7 kg 125.6 kg 122.4 kg    Exam:  Constitutional: Female, in no acute distress Eyes: EOMI, anicteric, normal conjunctivae ENMT: Oropharynx with moist mucous membranes, normal dentition Cardiovascular: RRR, SEM right upper sternal border, with no peripheral edema Respiratory: Normal respiratory effort on room air, clear breath sounds  Abdomen:  Soft,non-tender, Skin: No rash ulcers, or lesions. Without skin tenting  Neurologic: Word finding difficulties, able to follow commands, no facial droop, Psychiatric:Appropriate affect, and mood. Mental status alert, oriented to self, place.  Did not answer questions of orientation of time  Data Reviewed: CBC: Recent Labs  Lab 06/05/18 1456 06/06/18 0538 06/07/18 0311  WBC 12.5* 10.7* 10.7*  HGB 10.1* 9.3* 9.7*  HCT 33.1* 31.5* 32.8*  MCV  80.9 81.4 82.4  PLT 257 239 962   Basic Metabolic Panel: Recent Labs  Lab 06/05/18 1456 06/06/18 0157 06/07/18 0311  NA 139 140 138  K 4.0 4.3 4.8  CL 106 106 105  CO2 25 26 24   GLUCOSE 70 126* 260*  BUN 20 21 16   CREATININE 0.97 1.04* 0.99  CALCIUM 9.1 8.6* 8.7*   GFR: Estimated Creatinine Clearance: 73.4 mL/min (by C-G formula based on SCr of 0.99 mg/dL). Liver Function Tests: No results for input(s): AST, ALT, ALKPHOS, BILITOT, PROT, ALBUMIN in the last 168 hours. No results for input(s): LIPASE, AMYLASE in the last 168 hours. No results for input(s): AMMONIA in the last 168 hours. Coagulation Profile: No results for input(s): INR, PROTIME in the last 168 hours. Cardiac Enzymes: Recent Labs  Lab 06/05/18 1947 06/05/18 2304 06/06/18 0157  TROPONINI 0.25* 0.34* 0.35*   BNP (last 3 results) No results for input(s): PROBNP in the last 8760 hours. HbA1C: Recent Labs    06/05/18 1947  HGBA1C 7.3*   CBG: Recent Labs  Lab 06/06/18 1851 06/07/18 0025 06/07/18 0648 06/07/18 1214 06/07/18 1616  GLUCAP 110* 241* 161* 156* 192*   Lipid Profile: No results for input(s): CHOL, HDL, LDLCALC, TRIG, CHOLHDL, LDLDIRECT in the last 72 hours. Thyroid Function Tests: Recent Labs    06/05/18 1505  TSH 1.822   Anemia Panel: Recent Labs    06/06/18 1201  VITAMINB12 235  FERRITIN 9*  TIBC 364  IRON 27*   Urine analysis:    Component Value Date/Time   COLORURINE YELLOW 09/12/2017 1047   APPEARANCEUR Clear 02/25/2018 1050   LABSPEC 1.017 09/12/2017 1047   PHURINE 5.0 09/12/2017 1047   GLUCOSEU Negative 02/25/2018 1050   HGBUR SMALL (A) 09/12/2017 1047   BILIRUBINUR Negative 02/25/2018 [place] 09/12/2017 1047   PROTEINUR 3+ (A) 02/25/2018 1050   PROTEINUR >=300 (A) 09/12/2017 1047   UROBILINOGEN negative 11/14/2015 0949   NITRITE Negative 02/25/2018 1050   NITRITE NEGATIVE 09/12/2017 1047   LEUKOCYTESUR Negative 02/25/2018 1050   Sepsis  Labs: @LABRCNTIP (procalcitonin:4,lacticidven:4)  ) Recent Results (from the past 240 hour(s))  MRSA PCR Screening     Status: None   Collection Time: 06/06/18  5:47 PM  Result Value Ref Range Status   MRSA by PCR NEGATIVE NEGATIVE Final    Comment:        The GeneXpert MRSA Assay (FDA approved for NASAL specimens only), is one component of a comprehensive MRSA colonization surveillance program. It is not intended to diagnose MRSA infection nor to guide or monitor treatment for MRSA infections. Performed at [facility], [place] [address], [place], [place] [address]       Studies: Ct Head Wo Contrast  Result Date: 06/07/2018 CLINICAL DATA:  67 year old female with altered mental status and nausea. EXAM: CT HEAD WITHOUT CONTRAST TECHNIQUE: Contiguous axial images were obtained from the base of the skull through the vertex without intravenous contrast. COMPARISON:  Head CT 09/12/2017. FINDINGS: Brain: Cerebral volume is stable and within normal limits for age.  No midline shift, ventriculomegaly, mass effect, evidence of mass lesion, intracranial hemorrhage or evidence of cortically based acute infarction. Gray-white matter differentiation is within normal limits throughout the brain. Vascular: Calcified atherosclerosis at the skull base. No suspicious intracranial vascular hyperdensity. Skull: Hyperostosis, compatible with normal variant. Bone mineralization is within normal limits. No acute osseous abnormality identified. Sinuses/Orbits: Visualized paranasal sinuses and mastoids are stable and well pneumatized. Other: No acute orbit or scalp soft tissue findings. IMPRESSION: Stable and normal for age non contrast CT appearance of the brain. Electronically Signed   By: Genevie Ann M.D.   On: 06/07/2018 12:46    Scheduled Meds: . acyclovir  400 mg Oral Daily  . amLODipine  10 mg Oral Daily  . aspirin EC  81 mg Oral Daily  . fluticasone  2 spray Each Nare Daily  . furosemide  20 mg Oral  Daily  . HYDROcodone-acetaminophen  1 tablet Oral QHS  . insulin aspart  0-5 Units Subcutaneous QHS  . insulin aspart  0-9 Units Subcutaneous TID WC  . insulin aspart  15 Units Subcutaneous TID WC  . insulin glargine  25 Units Subcutaneous BID  . levothyroxine  100 mcg Oral QAC breakfast  . losartan  100 mg Oral Daily  . montelukast  10 mg Oral QHS  . pantoprazole  80 mg Oral Daily  . rosuvastatin  40 mg Oral QHS  . sodium chloride flush  3 mL Intravenous Q12H    Continuous Infusions: . sodium chloride    . heparin 1,900 Units/hr (06/07/18 1617)  . nitroGLYCERIN 15 mcg/min (06/07/18 1130)     LOS: 1 day     Desiree Hane, MD [facility] Pager [phone]  If 7PM-7AM, please contact night-coverage [email or web address] Password [facility] 06/07/2018, 6:25 PM

## 2018-06-08 ENCOUNTER — Encounter (HOSPITAL_COMMUNITY): Payer: Self-pay

## 2018-06-08 ENCOUNTER — Inpatient Hospital Stay (HOSPITAL_COMMUNITY): Payer: Medicare HMO

## 2018-06-08 DIAGNOSIS — I10 Essential (primary) hypertension: Secondary | ICD-10-CM

## 2018-06-08 DIAGNOSIS — E1159 Type 2 diabetes mellitus with other circulatory complications: Secondary | ICD-10-CM

## 2018-06-08 DIAGNOSIS — Z794 Long term (current) use of insulin: Secondary | ICD-10-CM

## 2018-06-08 DIAGNOSIS — H539 Unspecified visual disturbance: Secondary | ICD-10-CM

## 2018-06-08 LAB — GLUCOSE, CAPILLARY
GLUCOSE-CAPILLARY: 197 mg/dL — AB (ref 70–99)
GLUCOSE-CAPILLARY: 170 mg/dL — AB (ref 70–99)
GLUCOSE-CAPILLARY: 144 mg/dL — AB (ref 70–99)
GLUCOSE-CAPILLARY: 146 mg/dL — AB (ref 70–99)
Glucose-Capillary: 176 mg/dL — ABNORMAL HIGH (ref 70–99)

## 2018-06-08 LAB — CBC
HEMATOCRIT: 33 % — AB (ref 36.0–46.0)
Hemoglobin: 9.6 g/dL — ABNORMAL LOW (ref 12.0–15.0)
MCH: 24.2 pg — AB (ref 26.0–34.0)
MCHC: 29.1 g/dL — AB (ref 30.0–36.0)
MCV: 83.3 fL (ref 78.0–100.0)
Platelets: 206 10*3/uL (ref 150–400)
RBC: 3.96 MIL/uL (ref 3.87–5.11)
RDW: 16.4 % — AB (ref 11.5–15.5)
WBC: 10.5 10*3/uL (ref 4.0–10.5)

## 2018-06-08 LAB — HEPARIN LEVEL (UNFRACTIONATED): Heparin Unfractionated: 0.32 IU/mL (ref 0.30–0.70)

## 2018-06-08 MED ORDER — LORAZEPAM 2 MG/ML IJ SOLN
1.0000 mg | INTRAMUSCULAR | Status: DC | PRN
  Filled 2018-06-08: qty 1

## 2018-06-08 NOTE — Progress Notes (Signed)
Patient transported to MRI via bed from 2H25 on cardiac monitor.  Once in MRI patient switched to their cardiac monitor, SPO2, and BP cuff.  Heparin gtt infusing at 6ml/hr via MRI tubing.  Patient placed onto 3L/[place] due to having to lay supine and body habitus.  Sats-96% on 3L/[place].  NAD noted.  Call bell in hand once in MRI.  VSS

## 2018-06-08 NOTE — Progress Notes (Signed)
[place] for heparin Indication: ACS/STEMI  Allergies  Allergen Reactions  . Atorvastatin Other (See Comments)    Leg weakness.    Patient Measurements: Height: 5\' 5"  (165.1 cm) Weight: 269 lb 14.4 oz (122.4 kg) IBW/kg (Calculated) : 57 Heparin Dosing Weight: 88 kg  Vital Signs: Temp: 98.7 F (37.1 C) (09/22 0422) Temp Source: Oral (09/22 0422) BP: 173/69 (09/22 0900) Pulse Rate: 84 (09/22 0900)  Labs: Recent Labs    06/05/18 1456 06/05/18 1501 06/05/18 1947  06/05/18 2304 06/06/18 0157 06/06/18 0538 06/07/18 0311 06/07/18 0855 06/08/18 0215  HGB 10.1*  --   --   --   --   --  9.3* 9.7*  --  9.6*  HCT 33.1*  --   --   --   --   --  31.5* 32.8*  --  33.0*  PLT 257  --   --   --   --   --  239 227  --  206  APTT  --  32  --   --   --   --   --   --   --   --   HEPARINUNFRC  --   --   --    < > <0.10*  --  0.29*  --  0.26* 0.32  CREATININE 0.97  --   --   --   --  1.04*  --  0.99  --   --   TROPONINI  --   --  0.25*  --  0.34* 0.35*  --   --   --   --    < > = values in this interval not displayed.    Estimated Creatinine Clearance: 73.4 mL/min (by C-G formula based on SCr of 0.99 mg/dL).   Medical History: Past Medical History:  Diagnosis Date  . Allergy   . Anxiety   . Asthma   . Diabetes mellitus without complication ([place])   . Gout   . Hyperlipidemia   . Hypertension   . Hypothyroid   . Neuropathy   . Obesity   . Vitamin D deficiency     Medications:  Medications Prior to Admission  Medication Sig Dispense Refill Last Dose  . acyclovir (ZOVIRAX) 400 MG tablet TAKE 1 TABLET EVERY DAY 90 tablet 0 06/05/2018 at Unknown time  . albuterol (PROVENTIL HFA;VENTOLIN HFA) 108 (90 Base) MCG/ACT inhaler Inhale 1-2 puffs into the lungs every 6 (six) hours as needed for wheezing or shortness of breath.   06/05/2018 at Unknown time  . ALPRAZolam (XANAX) 0.5 MG tablet TAKE 1 TABLET 2 TIMES A DAY AS NEEDED FOR ANXIETY  45 tablet 2 06/05/2018 at Unknown time  . amLODipine (NORVASC) 10 MG tablet TAKE 1 TABLET EVERY DAY 90 tablet 0 06/05/2018 at Unknown time  . B-D UF III MINI PEN NEEDLES 31G X 5 MM MISC USE AS INSTRUCTED TO GIVE LANTUS INJECTION TWICE DAILY 180 each 1 06/05/2018 at Unknown time  . fluticasone (FLONASE) 50 MCG/ACT nasal spray USE 2 SPRAYS IN EACH NOSTRIL EVERY DAY 48 g 1 06/05/2018 at Unknown time  . furosemide (LASIX) 20 MG tablet TAKE 1 TABLET (20 MG TOTAL) BY MOUTH DAILY. 90 tablet 0 06/05/2018 at Unknown time  . HYDROcodone-acetaminophen (NORCO) 7.5-325 MG tablet Take 1 tablet by mouth at bedtime. 30 tablet 0 06/04/2018 at Unknown time  . Ipratropium-Albuterol (COMBIVENT RESPIMAT) 20-100 MCG/ACT AERS respimat Inhale 1 puff into the lungs every 6 (six) hours  as needed for wheezing. 12 g 1 06/05/2018 at Unknown time  . LANTUS SOLOSTAR 100 UNIT/ML Solostar Pen INJECT 45 UNITS SUBCUTANEOUSLY 2 TIMES DAILY 90 mL 0 06/05/2018 at Unknown time  . levothyroxine (SYNTHROID, LEVOTHROID) 50 MCG tablet TAKE 1 TABLET EVERY DAY BEFORE BREAKFAST 90 tablet 3 06/05/2018 at Unknown time  . losartan (COZAAR) 100 MG tablet TAKE 1 TABLET EVERY DAY 90 tablet 0 06/05/2018 at Unknown time  . metoprolol succinate (TOPROL-XL) 25 MG 24 hr tablet Take 1 tablet (25 mg total) by mouth daily. 90 tablet 3 06/05/2018 at 0900  . montelukast (SINGULAIR) 10 MG tablet TAKE 1 TABLET (10 MG TOTAL) BY MOUTH AT BEDTIME. 90 tablet 1 06/04/2018 at Unknown time  . NOVOLOG FLEXPEN 100 UNIT/ML FlexPen INJECT 45 UNITS SUBCUTANEOUSLY THREE TIMES DAILY WITH MEALS 120 mL 1 06/05/2018 at Unknown time  . omeprazole (PRILOSEC) 40 MG capsule TAKE 1 CAPSULE EVERY DAY 90 capsule 0 06/05/2018 at Unknown time  . rosuvastatin (CRESTOR) 20 MG tablet Take 1 tablet (20 mg total) by mouth at bedtime. 30 tablet 11 06/04/2018 at Unknown time  . ACCU-CHEK SOFTCLIX LANCETS lancets Test BG TID 300 each 3 Taking    Assessment: Pharmacy consulted to dose heparin in patient for  ACS/STEMI.  She is now s/p cath and possible CABG but needs to discuss with family. ASA, statin, NTG drip - now off, amlo losartan BP stable, no BB bradycardia 60s.  Heparin restarted post cath 1900 units/hr HL 0.32 at goal.  CBC stable no bleeding noted   Goal of Therapy:  Heparin level 0.3-0.7 units/ml Monitor platelets by anticoagulation protocol: Yes   Plan:  Continue heparin drip 1900 uts/hr  HL, CBC daily  Bonnita Nasuti Pharm.D. CPP, BCPS Clinical Pharmacist [phone] 06/08/2018 10:18 AM

## 2018-06-08 NOTE — Progress Notes (Signed)
Patient's daughter Vernice Mannina called patient and patient asked this RN to update her daughter on CVA diagnosis and POC.  Daughter verbalized understanding.  Patient given phone and talking with daughter.  NAD noted.  Call bell in reach.

## 2018-06-08 NOTE — Progress Notes (Signed)
Dr. Lorraine Lax and Laurey Morale, NP with neurology at bedside to update patient on diagnosis and POC.  Sister, Silva Bandy and cousin at bedside.  Updated providers that patient passed bedside swallow.  Patient ok to have previous diet and to keep SBP>/= 160.  Patient and family members verbalized understanding.

## 2018-06-08 NOTE — Progress Notes (Signed)
PROGRESS NOTE  Mary Raymond TOI:[id number] DOB: August 07, 1951 DOA: 06/05/2018 [name], FNP  HPI/Brief Narrative  Mary Raymond is a 67 y.o. year old female with medical history significant for morbid obesity, HTN, HLD and diabetes who presented on 06/05/2018 with chest pain and elevated troponin 0 0.25 and was found to have an NSTEMI with heart cath on 9/20 showing severe three-vessel disease.    Subjective NO confusion. Reports blacked out vision in lateral quadrant of right eye. No chest pain  Assessment/Plan:  ?Right eye vision loss. Reporting blacked out vision in lateral quadrant of right eye. Unable to read numbers from that side on exam. Discussed with Dr. Lorraine Lax from Neurology, will obtain STAT MRI brain to rule out stroke.   NSTEMI, stable.  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  Cardiology and cardiothoracic surgery following patient likely CABG candidate depending on functional status.  Hold off on DAPT for now, continue IV heparin.  Nitro drip started after heart cath  Second-degree AV block, type I, stable.  Heart rate range 40-60.  Monitoring on telemetry  Hypertension, not at goal, improving.  On home losartan and amlodipine.  Holding PTA Toprol-XL given above bradycardia.  Altered mental status, acute on chronic.  Family reports at baseline lucid with frequent intervals of confusion and odd behavior for multiple years seems consistent with dementia. She is more alert and oriented on 9/22 with no word finding difficulties as seen earlier in hospitalization.   Avoid sedating/BEERS criteria medications.  Possible element of delirium. continue precautions  Chronic anemia.  Hemoglobin stable at 9.  Monitor CBC.  Type 2 diabetes, A1c on 05/2018-7.3%, controlled  Home Lantus regimen decreased from 45 units twice daily to 25 units with good blood glucose control.  Monitor CBGs, 15 units NovoLog scheduled with meals, sliding  scale.  Hyperlipidemia.  On Crestor 40  Hypothyroidism, stable.  TSH 1.8.  Continue home levothyroxine 100 mcg daily   Code Status: Full code  Family Communication: will update Sister   Disposition Plan: Discussion amongst family between cardiology and cardiothoracic surgery regarding next steps for severe three-vessel disease, stat MRI brain to ensure no stroke per discussion with Dr. Lorraine Lax ( Neurology)  Consultants:  Treatment Team:  Arnoldo Lenis, MD [facility], Michae Kava, MD  Grace Isaac, MD    Procedures:  [facility] 9/20  TTE   Antimicrobials: Anti-infectives (From admission, onward)   Start     Dose/Rate Route Frequency Ordered Stop   06/06/18 1000  acyclovir (ZOVIRAX) tablet 400 mg     400 mg Oral Daily 06/05/18 1844          Cultures:  None  Telemetry: Yes  DVT prophylaxis: IV heparin   Objective: Vitals:   06/08/18 0700 06/08/18 0730 06/08/18 0809 06/08/18 0825  BP:  (!) 166/69 (!) 176/64 (!) 153/82  Pulse:  (!) 123 62 61  Resp: 17 18 19 15   Temp:      TempSrc:      SpO2: 99% 100% 96% 97%  Weight:      Height:        Intake/Output Summary (Last 24 hours) at 06/08/2018 0839 Last data filed at 06/08/2018 0630 Gross per 24 hour  Intake 1274.51 ml  Output 625 ml  Net 649.51 ml   Filed Weights   06/05/18 1454 06/05/18 1835 06/06/18 1513  Weight: 126.7 kg 125.6 kg 122.4 kg    Exam:  Constitutional: Female, in no acute distress  Eyes: left EOMI, able to move in all quadrants except laterally in right eye ENMT: Oropharynx with moist mucous membranes, normal dentition Cardiovascular: no peripheral edema Respiratory: Normal respiratory effort on room air, clear breath sounds  Skin: No rash ulcers, or lesions. Without skin tenting  Neurologic: No Word finding difficulties ( improved from 9/21), able to follow commands, unclear if slight right sided facial droop, ? Blacked out vision in lateral quadrant of right  eye Psychiatric:Appropriate affect, and mood. Mental status alert, oriented to self, place, context.   Data Reviewed: CBC: Recent Labs  Lab 06/05/18 1456 06/06/18 0538 06/07/18 0311 06/08/18 0215  WBC 12.5* 10.7* 10.7* 10.5  HGB 10.1* 9.3* 9.7* 9.6*  HCT 33.1* 31.5* 32.8* 33.0*  MCV 80.9 81.4 82.4 83.3  PLT 257 239 227 784   Basic Metabolic Panel: Recent Labs  Lab 06/05/18 1456 06/06/18 0157 06/07/18 0311  NA 139 140 138  K 4.0 4.3 4.8  CL 106 106 105  CO2 25 26 24   GLUCOSE 70 126* 260*  BUN 20 21 16   CREATININE 0.97 1.04* 0.99  CALCIUM 9.1 8.6* 8.7*   GFR: Estimated Creatinine Clearance: 73.4 mL/min (by C-G formula based on SCr of 0.99 mg/dL). Liver Function Tests: No results for input(s): AST, ALT, ALKPHOS, BILITOT, PROT, ALBUMIN in the last 168 hours. No results for input(s): LIPASE, AMYLASE in the last 168 hours. No results for input(s): AMMONIA in the last 168 hours. Coagulation Profile: No results for input(s): INR, PROTIME in the last 168 hours. Cardiac Enzymes: Recent Labs  Lab 06/05/18 1947 06/05/18 2304 06/06/18 0157  TROPONINI 0.25* 0.34* 0.35*   BNP (last 3 results) No results for input(s): PROBNP in the last 8760 hours. HbA1C: Recent Labs    06/05/18 1947  HGBA1C 7.3*   CBG: Recent Labs  Lab 06/07/18 0648 06/07/18 1214 06/07/18 1616 06/07/18 2158 06/08/18 0649  GLUCAP 161* 156* 192* 155* 176*   Lipid Profile: No results for input(s): CHOL, HDL, LDLCALC, TRIG, CHOLHDL, LDLDIRECT in the last 72 hours. Thyroid Function Tests: Recent Labs    06/05/18 1505  TSH 1.822   Anemia Panel: Recent Labs    06/06/18 1201  VITAMINB12 235  FERRITIN 9*  TIBC 364  IRON 27*   Urine analysis:    Component Value Date/Time   COLORURINE YELLOW 09/12/2017 1047   APPEARANCEUR Clear 02/25/2018 1050   LABSPEC 1.017 09/12/2017 1047   PHURINE 5.0 09/12/2017 1047   GLUCOSEU Negative 02/25/2018 1050   HGBUR SMALL (A) 09/12/2017 1047    BILIRUBINUR Negative 02/25/2018 [facility] 09/12/2017 1047   PROTEINUR 3+ (A) 02/25/2018 1050   PROTEINUR >=300 (A) 09/12/2017 1047   UROBILINOGEN negative 11/14/2015 0949   NITRITE Negative 02/25/2018 1050   NITRITE NEGATIVE 09/12/2017 1047   LEUKOCYTESUR Negative 02/25/2018 1050   Sepsis Labs: @LABRCNTIP (procalcitonin:4,lacticidven:4)  ) Recent Results (from the past 240 hour(s))  MRSA PCR Screening     Status: None   Collection Time: 06/06/18  5:47 PM  Result Value Ref Range Status   MRSA by PCR NEGATIVE NEGATIVE Final    Comment:        The GeneXpert MRSA Assay (FDA approved for NASAL specimens only), is one component of a comprehensive MRSA colonization surveillance program. It is not intended to diagnose MRSA infection nor to guide or monitor treatment for MRSA infections. Performed at [facility], [place] [address], [place], [place] [address]       Studies: Ct Head Wo Contrast  Result Date: 06/07/2018 CLINICAL DATA:  67 year old female with altered mental status and nausea. EXAM: CT HEAD WITHOUT CONTRAST TECHNIQUE: Contiguous axial images were obtained from the base of the skull through the vertex without intravenous contrast. COMPARISON:  Head CT 09/12/2017. FINDINGS: Brain: Cerebral volume is stable and within normal limits for age. No midline shift, ventriculomegaly, mass effect, evidence of mass lesion, intracranial hemorrhage or evidence of cortically based acute infarction. Gray-white matter differentiation is within normal limits throughout the brain. Vascular: Calcified atherosclerosis at the skull base. No suspicious intracranial vascular hyperdensity. Skull: Hyperostosis, compatible with normal variant. Bone mineralization is within normal limits. No acute osseous abnormality identified. Sinuses/Orbits: Visualized paranasal sinuses and mastoids are stable and well pneumatized. Other: No acute orbit or scalp soft tissue findings. IMPRESSION:  Stable and normal for age non contrast CT appearance of the brain. Electronically Signed   By: Genevie Ann M.D.   On: 06/07/2018 12:46    Scheduled Meds: . acyclovir  400 mg Oral Daily  . amLODipine  10 mg Oral Daily  . aspirin EC  81 mg Oral Daily  . fluticasone  2 spray Each Nare Daily  . furosemide  20 mg Oral Daily  . HYDROcodone-acetaminophen  1 tablet Oral QHS  . insulin aspart  0-5 Units Subcutaneous QHS  . insulin aspart  0-9 Units Subcutaneous TID WC  . insulin aspart  15 Units Subcutaneous TID WC  . insulin glargine  25 Units Subcutaneous BID  . levothyroxine  100 mcg Oral QAC breakfast  . losartan  100 mg Oral Daily  . montelukast  10 mg Oral QHS  . pantoprazole  80 mg Oral Daily  . rosuvastatin  40 mg Oral QHS  . sodium chloride flush  3 mL Intravenous Q12H    Continuous Infusions: . sodium chloride    . heparin 1,900 Units/hr (06/08/18 0630)  . nitroGLYCERIN 10 mcg/min (06/08/18 0630)     LOS: 2 days     Desiree Hane, MD [facility] Pager [phone]  If 7PM-7AM, please contact night-coverage [email or web address] Password [facility] 06/08/2018, 8:39 AM

## 2018-06-08 NOTE — Progress Notes (Addendum)
Upon returning to 2H-25, patient's gown changed and connected to cardiac monitoring equipment.  Red moist rash noted under left lower skin fold.  Cleansed with soap and water, anti fungal powder applied.  Cardiac leads changed.  Pure wick placed for patient comfort after peri-care and from administration of PO Lasix.  Patient reports loss of peripheral vision to right eye on exam..

## 2018-06-08 NOTE — Consult Note (Addendum)
[place]   Requesting Physician: Dr. Lonny Prude    Chief Complaint: chest pain  History obtained from:  Patient   HPI:                                                                                                                                         Mary Raymond is an 67 y.o. female PMH of DM, HLD, and obesity who originally presented to [facility] on 06-05-18 with chest pain. Neurology consulted for CVA seen on MRI.    Per patient ( who has memory issues) she does not recall having any problems. Per nursing this morning about 0800 patient was up in the chair and said that she felt like she was going to " go out", she also reported some vision loss. Also there was a facial droop and weakness noted. Patient with prompting then remembered that she was talking to her doctor, but suddenly could not see the doctor standing to her right. Admits that vision is getting better and she can make out items on the right side.   Hospital course: CT Head MRI brain Cardiac cath 06/06/18 ECHO 06/06/18  No prior stroke history.   Date last known well: Date: 06/08/2018 Time last known well: Time: 08:00 tPA Given: No: outside of window Modified Rankin: Rankin Score=1 NIHSS:2    Past Medical History:  Diagnosis Date  . Allergy   . Anxiety   . Asthma   . Diabetes mellitus without complication ([place])   . Gout   . Hyperlipidemia   . Hypertension   . Hypothyroid   . Neuropathy   . Obesity   . Vitamin D deficiency     Past Surgical History:  Procedure Laterality Date  . ABDOMINAL HYSTERECTOMY    . BREAST LUMPECTOMY    . LEFT HEART CATH AND CORONARY ANGIOGRAPHY N/A 06/06/2018   Procedure: LEFT HEART CATH AND CORONARY ANGIOGRAPHY;  Surgeon: Troy Sine, MD;  Location: [facility];  Service: Cardiovascular;  Laterality: N/A;  . rectal fissure      Family History  Problem Relation Age of Onset  . Cancer Mother         colon  . Diabetes Mother   . Hypertension Mother   . Dementia Mother   . Heart disease Father   . Asthma Sister   . Cancer Sister        breast  . Asthma Sister   . Scoliosis Sister   . Diabetes Sister   . Asthma Sister          Social History:  reports that she has never  smoked. She has never used smokeless tobacco. She reports that she does not drink alcohol or use drugs.  Allergies:  Allergies  Allergen Reactions  . Atorvastatin Other (See Comments)    Leg weakness.    Medications:                                                                                                                           Scheduled: . acyclovir  400 mg Oral Daily  . amLODipine  10 mg Oral Daily  . aspirin EC  81 mg Oral Daily  . fluticasone  2 spray Each Nare Daily  . furosemide  20 mg Oral Daily  . HYDROcodone-acetaminophen  1 tablet Oral QHS  . insulin aspart  0-5 Units Subcutaneous QHS  . insulin aspart  0-9 Units Subcutaneous TID WC  . insulin aspart  15 Units Subcutaneous TID WC  . insulin glargine  25 Units Subcutaneous BID  . levothyroxine  100 mcg Oral QAC breakfast  . losartan  100 mg Oral Daily  . montelukast  10 mg Oral QHS  . pantoprazole  80 mg Oral Daily  . rosuvastatin  40 mg Oral QHS  . sodium chloride flush  3 mL Intravenous Q12H   Continuous: . sodium chloride    . heparin 1,900 Units/hr (06/08/18 1247)  . nitroGLYCERIN Stopped (06/08/18 0919)   CHY:IFOYDX chloride, acetaminophen, albuterol, ALPRAZolam, gi cocktail, ipratropium-albuterol, LORazepam, morphine injection, ondansetron (ZOFRAN) IV, sodium chloride flush   ROS:                                                                                                                                       ROS was performed and is negative except as noted in HPI    General Examination:                                                                                                      Blood pressure (!) 168/66,  pulse 74, temperature 98.7 F (37.1 C), temperature source Oral, resp. rate 19, height 5\' 5"  (1.651 m), weight 122.4 kg, SpO2 94 %.  HEENT-  Normocephalic, no lesions, without obvious abnormality.  Normal external eye and conjunctiva.  Cardiovascular- S1-S2 audible, pulses palpable throughout   Lungs-no rhonchi or wheezing noted, no excessive working breathing.  Saturations within normal limits Abdomen- All 4 quadrants palpated and nontender Extremities- Warm, dry and intact Musculoskeletal-no joint tenderness, deformity or swelling Skin-warm and dry, no hyperpigmentation, vitiligo, or suspicious lesions  Neurological Examination Mental Status: Alert, oriented, thought content appropriate.  Speech fluent without evidence of aphasia.  Able to follow  commands without difficulty. Cranial Nerves: II:  Right eye with right peripheral field cut, vision is returning, no problems with left eye. III,IV, VI: ptosis not present, extra-ocular motions intact bilaterally, pupils equal, round, reactive to light  V,VII: smile asymmetric, right facial droop noted, facial light touch sensation normal bilaterally VIII: hearing normal bilaterally IX,X: uvula rises symmetrically XI: bilateral shoulder shrug XII: midline tongue extension Motor: Right : Upper extremity   5/5  Left:     Upper extremity   5/5  Lower extremity   5/5  Lower extremity   5/5 Tone and bulk:normal tone throughout; no atrophy noted Sensory: Pinprick and light touch intact throughout, bilaterally Deep Tendon Reflexes: 2+ and symmetric biceps and patella Plantars: Right: downgoing   Left: downgoing Cerebellar: normal finger-to-nose, normal rapid alternating movements and normal heel-to-shin test Gait: deferred   Lab Results: Basic Metabolic Panel: Recent Labs  Lab 06/05/18 1456 06/06/18 0157 06/07/18 0311  NA 139 140 138  K 4.0 4.3 4.8  CL 106 106 105  CO2 25 26 24   GLUCOSE 70 126* 260*  BUN 20 21 16   CREATININE 0.97  1.04* 0.99  CALCIUM 9.1 8.6* 8.7*    CBC: Recent Labs  Lab 06/05/18 1456 06/06/18 0538 06/07/18 0311 06/08/18 0215  WBC 12.5* 10.7* 10.7* 10.5  HGB 10.1* 9.3* 9.7* 9.6*  HCT 33.1* 31.5* 32.8* 33.0*  MCV 80.9 81.4 82.4 83.3  PLT 257 239 227 206    Lipid Panel: No results for input(s): CHOL, TRIG, HDL, CHOLHDL, VLDL, LDLCALC in the last 168 hours.  CBG: Recent Labs  Lab 06/07/18 1616 06/07/18 2158 06/08/18 0649 06/08/18 0754 06/08/18 1325  GLUCAP 192* 155* 176* 197* 170*    Imaging: Ct Head Wo Contrast  Result Date: 06/07/2018 CLINICAL DATA:  67 year old female with altered mental status and nausea. EXAM: CT HEAD WITHOUT CONTRAST TECHNIQUE: Contiguous axial images were obtained from the base of the skull through the vertex without intravenous contrast. COMPARISON:  Head CT 09/12/2017. FINDINGS: Brain: Cerebral volume is stable and within normal limits for age. No midline shift, ventriculomegaly, mass effect, evidence of mass lesion, intracranial hemorrhage or evidence of cortically based acute infarction. Gray-white matter differentiation is within normal limits throughout the brain. Vascular: Calcified atherosclerosis at the skull base. No suspicious intracranial vascular hyperdensity. Skull: Hyperostosis, compatible with normal variant. Bone mineralization is within normal limits. No acute osseous abnormality identified. Sinuses/Orbits: Visualized paranasal sinuses and mastoids are stable and well pneumatized. Other: No acute orbit or scalp soft tissue findings. IMPRESSION: Stable and normal for age non contrast CT appearance of the brain. Electronically Signed   By: Genevie Ann M.D.   On: 06/07/2018 12:46   Mr Brain Wo Contrast  Result Date: 06/08/2018 CLINICAL DATA:  Altered mental status, nausea. Suspect stroke. History of hypertension, hyperlipidemia, diabetes. EXAM: MRI HEAD WITHOUT CONTRAST TECHNIQUE: Multiplanar,  multiecho pulse sequences of the brain and surrounding  structures were obtained without intravenous contrast. COMPARISON:  CT HEAD June 07, 2018. FINDINGS: INTRACRANIAL CONTENTS: Subcentimeter foci reduced diffusion LEFT occipital lobe with low ADC values. No susceptibility artifact to suggest hemorrhage. Ventricles and sulci are normal for patient's age. A fused subcentimeter supratentorial and patchy pontine white matter FLAIR T2 hyperintensities. No midline shift, mass effect or masses. No abnormal extra-axial fluid collections. VASCULAR: Normal major intracranial vascular flow voids present at skull base. Loss of LEFT mid to distal posterior cerebral artery flow void versus artifact. SKULL AND UPPER CERVICAL SPINE: No abnormal sellar expansion. No suspicious calvarial bone marrow signal. Craniocervical junction maintained. SINUSES/ORBITS: The mastoid air-cells and included paranasal sinuses are well-aerated.The included ocular globes and orbital contents are non-suspicious. OTHER: None. IMPRESSION: 1. 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 2. Occluded LEFT mid to distal PCA versus artifact. Consider CTA versus MRA head. 3. Mild chronic small vessel ischemic changes. 4. These results will be called to the ordering clinician or representative by the Radiologist Assistant, and communication documented in the PACS or zVision Dashboard. Electronically Signed   By: Elon Alas M.D.   On: 06/08/2018 13:23      Laurey Morale, MSN, NP-C [facility] [phone]   06/08/2018, 2:35 PM   Attending physician note to follow with Assessment and plan .   Assessment: 67 y.o. female PMH of DM, HLD, and obesity who originally presented to [facility] on [facility]05[facility]18 with chest pain. This morning patient c/o of right peripheral vision loss in right eye. MRI showed  Left occipital lobe infarct and questionable left mild distal PCA occlusion.  Etiology likely cardioembolic versus postprocedure.  Further stroke work up needed.    Impression: Stroke Risk Factors - diabetes mellitus and hyperlipidemia Etiology : unknown, further stroke work-up needed   Recommendations: --MRI Brain  --SBP 150-180 --CTA --Echocardiogram ( done) - Currently on Heparin drip for NSTEMI -- High intensity Statin if LDL > 70 -- HgbA1c, fasting lipid panel -- PT consult, OT consult, Speech consult --Telemetry monitoring --Frequent neuro checks --Stroke swallow screen    --please page stroke NP  Or  PA  Or MD from 8am -4 pm  as this patient from this time will be  followed by the stroke.   You can look them up on [email or web address]  Password [email or web address]   NEUROHOSPITALIST ADDENDUM Performed a face to face diagnostic evaluation.   I have reviewed the contents of history and physical exam as documented by PA/ARNP/Resident and agree with above documentation.  I have discussed and formulated the above plan as documented. Edits to the note have been made as needed.  Neurology was consulted as patient noticed that she was having difficulty seeing from the right eye.  She had impaired right temporal visual field, only in her right eye.  MRI was obtained which showed small subcentimeter left occipital infarcts.  Etiology of the stroke is unclear-possibly postprocedural versus cardioembolic such as paroxysmal A. fib.  Will also obtain CT angiogram to assess for intracranial atherosclerosis.  Currently she is on heparin drip which I think is fine as her size of stroke is not a big.  Eventually whether or not she needs to be on aspirin or anticoagulation will depend on etiology of the stroke.  As she is being worked up for CABG,  I think is reasonable for her to undergo surgery at the site of the stroke is not large.     Karena Addison Chasey Dull MD  [facility] [phone]   If 7pm to 7am, please call on call as listed on AMION.

## 2018-06-08 NOTE — Progress Notes (Signed)
Patient advised that results of MRI are back and that this nurse needed to page Dr. Lonny Prude for results to be given to her.  Verbalized understanding.

## 2018-06-08 NOTE — Progress Notes (Deleted)
Patient observed standing at side of bed after talking to his friend on the phone  His friend is wanting to leave for [facility] and patient has requested to speak to dr. Martinique.  Patient wants to be discharged today.  Dr. Martinique paged and was on unit.  Came to patient and advised that he is not going to be discharged today.  Patient verbalized understanding.  NAD noted.  Call bell in reach.

## 2018-06-08 NOTE — Progress Notes (Signed)
Patient reports vision has improved in right eye since initial complaint.  No other s/s CVA or distress.  Moves all extremities equally and assist with turning in bed to use bedpan.
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
## 2018-06-08 NOTE — Progress Notes (Signed)
MRI results called to this nurse by radiologist.  Paged Dr. Lonny Prude who was given results of MRI impression.Verbalzied understanding. Dr. Lonny Prude to consult neurology.  Patient to be NPO for now.

## 2018-06-08 NOTE — Progress Notes (Signed)
Patient ID: Mary Raymond, female   DOB: Nov 02, 1950, 67 y.o.   MRN: [id number] TCTS DAILY ICU PROGRESS NOTE                   [address]            ,[place] [address]          [phone]   2 Days Post-Op Procedure(s) (LRB): LEFT HEART CATH AND CORONARY ANGIOGRAPHY (N/A)  Total Length of Stay:  LOS: 2 days   Subjective: In addition to the preoperative problems the patient had noted and documented that she is unable to walk is very weak legs cannot stand more than 2 to 3 minutes at a time which is been a chronic problem, this morning she notes difficulty seeing to the right, CT of the brain was done yesterday because of headache while on nitroglycerin, today MRI of the brain confirms evidence of stroke-acute  Objective: Vital signs in last 24 hours: Temp:  [98 F (36.7 C)-99 F (37.2 C)] 99 F (37.2 C) (09/22 1320) Pulse Rate:  [37-123] 41 (09/22 1800) Cardiac Rhythm: Heart block (09/22 1232) Resp:  [14-26] 23 (09/22 1800) BP: (122-187)/(40-83) 160/51 (09/22 1800) SpO2:  [92 %-100 %] 92 % (09/22 1800)  Filed Weights   06/05/18 1454 06/05/18 1835 06/06/18 1513  Weight: 126.7 kg 125.6 kg 122.4 kg    Weight change:    Hemodynamic parameters for last 24 hours:    Intake/Output from previous day: 09/21 0701 - 09/22 0700 In: 1288.2 [P.O.:480; I.V.:808.2] Out: 625 [Urine:625]  Intake/Output this shift: Total I/O In: 695.9 [P.O.:480; I.V.:215.9] Out: 3275 [Urine:3275]  Current Meds: Scheduled Meds: . acyclovir  400 mg Oral Daily  . amLODipine  10 mg Oral Daily  . aspirin EC  81 mg Oral Daily  . fluticasone  2 spray Each Nare Daily  . furosemide  20 mg Oral Daily  . HYDROcodone-acetaminophen  1 tablet Oral QHS  . insulin aspart  0-5 Units Subcutaneous QHS  . insulin aspart  0-9 Units Subcutaneous TID WC  . insulin aspart  15 Units Subcutaneous TID WC  . insulin glargine  25 Units Subcutaneous BID  . levothyroxine  100 mcg Oral QAC breakfast  .  losartan  100 mg Oral Daily  . montelukast  10 mg Oral QHS  . pantoprazole  80 mg Oral Daily  . rosuvastatin  40 mg Oral QHS  . sodium chloride flush  3 mL Intravenous Q12H   Continuous Infusions: . sodium chloride    . heparin 1,900 Units/hr (06/08/18 1820)  . nitroGLYCERIN Stopped (06/08/18 0919)   PRN Meds:.sodium chloride, acetaminophen, albuterol, ALPRAZolam, gi cocktail, ipratropium-albuterol, LORazepam, morphine injection, ondansetron (ZOFRAN) IV, sodium chloride flush  General appearance: cooperative, appears older than stated age, no distress and slowed mentation Neurologic: Bilateral lower extremity weakness, same as yesterday now new visual field cut to the right Heart: regular rate and rhythm, S1, S2 normal, no murmur, click, rub or gallop Lungs: diminished breath sounds bibasilar Abdomen: soft, non-tender; bowel sounds normal; no masses,  no organomegaly Extremities: edema Bilateral pedal  Lab Results: CBC: Recent Labs    06/07/18 0311 06/08/18 0215  WBC 10.7* 10.5  HGB 9.7* 9.6*  HCT 32.8* 33.0*  PLT 227 206   BMET:  Recent Labs    06/06/18 0157 06/07/18 0311  NA 140 138  K 4.3 4.8  CL 106 105  CO2 26 24  GLUCOSE 126* 260*  BUN 21 16  CREATININE 1.04* 0.99  CALCIUM 8.6* 8.7*    CMET: Lab Results  Component Value Date   WBC 10.5 06/08/2018   HGB 9.6 (L) 06/08/2018   HCT 33.0 (L) 06/08/2018   PLT 206 06/08/2018   GLUCOSE 260 (H) 06/07/2018   CHOL 201 (H) 02/25/2018   TRIG 207 (H) 02/25/2018   HDL 42 02/25/2018   LDLCALC 118 (H) 02/25/2018   ALT 9 02/25/2018   AST 12 02/25/2018   NA 138 06/07/2018   K 4.8 06/07/2018   CL 105 06/07/2018   CREATININE 0.99 06/07/2018   BUN 16 06/07/2018   CO2 24 06/07/2018   TSH 1.822 06/05/2018   HGBA1C 7.3 (H) 06/05/2018   MICROALBUR 100 11/15/2014      PT/INR: No results for input(s): LABPROT, INR in the last 72 hours. Radiology: Mr Brain Wo Contrast  Result Date: 06/08/2018 CLINICAL DATA:  Altered  mental status, nausea. Suspect stroke. History of hypertension, hyperlipidemia, diabetes. EXAM: MRI HEAD WITHOUT CONTRAST TECHNIQUE: Multiplanar, multiecho pulse sequences of the brain and surrounding structures were obtained without intravenous contrast. COMPARISON:  CT HEAD June 07, 2018. FINDINGS: INTRACRANIAL CONTENTS: Subcentimeter foci reduced diffusion LEFT occipital lobe with low ADC values. No susceptibility artifact to suggest hemorrhage. Ventricles and sulci are normal for patient's age. A fused subcentimeter supratentorial and patchy pontine white matter FLAIR T2 hyperintensities. No midline shift, mass effect or masses. No abnormal extra-axial fluid collections. VASCULAR: Normal major intracranial vascular flow voids present at skull base. Loss of LEFT mid to distal posterior cerebral artery flow void versus artifact. SKULL AND UPPER CERVICAL SPINE: No abnormal sellar expansion. No suspicious calvarial bone marrow signal. Craniocervical junction maintained. SINUSES/ORBITS: The mastoid air-cells and included paranasal sinuses are well-aerated.The included ocular globes and orbital contents are non-suspicious. OTHER: None. IMPRESSION: 1. 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 2. Occluded LEFT mid to distal PCA versus artifact. Consider CTA versus MRA head. 3. Mild chronic small vessel ischemic changes. 4. These results will be called to the ordering clinician or representative by the Radiologist Assistant, and communication documented in the PACS or zVision Dashboard. Electronically Signed   By: Elon Alas M.D.   On: 06/08/2018 13:23     Assessment/Plan: S/P Procedure(s) (LRB): LEFT HEART CATH AND CORONARY ANGIOGRAPHY (N/A)  Independent of the findings of the MRI today the patient is extremely poor candidate to consider for coronary artery bypass grafting, her preoperative and long-term functional status is extremely poor, she would have a difficult time recovering from  major interventions.  Discussed with Dr. Martinique, options would include limited angioplasty, atherectomy versus palliative care.     Grace Isaac 06/08/2018 6:51 PM

## 2018-06-08 NOTE — Clinical Social Work Note (Signed)
In order to determine pt's mental capacity to make her own decision regarding d/c MD please order psych consult.  [place], [place]

## 2018-06-08 NOTE — Progress Notes (Signed)
Patient complaining of having trouble seeing out the right side of both eyes. States it is blacked out. Small right side facial droop noted when smiling. Otherwise neuro assessment benign. Dr.Nettey called and came to bedside immediately and is consulting Neurology. Will wait further orders. Will continue to monitor patient closely.

## 2018-06-08 NOTE — Progress Notes (Addendum)
Per call to Laurey Morale, NP, if patient passes bedside swallow exam, patient may have previous diet and not remain NPO. Janett Billow, NP is awaiting Dr. Arrie Eastern recommendations for BP parameters and will advise.

## 2018-06-08 NOTE — Progress Notes (Signed)
Progress Note  Patient Name: Mary Raymond Date of Encounter: 06/08/2018  Primary Cardiologist: Carlyle Dolly, MD   Subjective   She reports difficulty in vision in right visual field. Confusion better. Has intermittent chest pain but states it doesn't last long.   Inpatient Medications    Scheduled Meds: . acyclovir  400 mg Oral Daily  . amLODipine  10 mg Oral Daily  . aspirin EC  81 mg Oral Daily  . fluticasone  2 spray Each Nare Daily  . furosemide  20 mg Oral Daily  . HYDROcodone-acetaminophen  1 tablet Oral QHS  . insulin aspart  0-5 Units Subcutaneous QHS  . insulin aspart  0-9 Units Subcutaneous TID WC  . insulin aspart  15 Units Subcutaneous TID WC  . insulin glargine  25 Units Subcutaneous BID  . levothyroxine  100 mcg Oral QAC breakfast  . losartan  100 mg Oral Daily  . montelukast  10 mg Oral QHS  . pantoprazole  80 mg Oral Daily  . rosuvastatin  40 mg Oral QHS  . sodium chloride flush  3 mL Intravenous Q12H   Continuous Infusions: . sodium chloride    . heparin 1,900 Units/hr (06/08/18 0630)  . nitroGLYCERIN 10 mcg/min (06/08/18 0630)   PRN Meds: sodium chloride, acetaminophen, albuterol, ALPRAZolam, gi cocktail, ipratropium-albuterol, LORazepam, morphine injection, ondansetron (ZOFRAN) IV, sodium chloride flush   Vital Signs    Vitals:   06/08/18 0730 06/08/18 0809 06/08/18 0825 06/08/18 0900  BP: (!) 166/69 (!) 176/64 (!) 153/82 (!) 173/69  Pulse: (!) 123 62 61 84  Resp: 18 19 15  (!) 26  Temp:      TempSrc:      SpO2: 100% 96% 97% 99%  Weight:      Height:        Intake/Output Summary (Last 24 hours) at 06/08/2018 0926 Last data filed at 06/08/2018 0630 Gross per 24 hour  Intake 1236.87 ml  Output 625 ml  Net 611.87 ml   Filed Weights   06/05/18 1454 06/05/18 1835 06/06/18 1513  Weight: 126.7 kg 125.6 kg 122.4 kg    Telemetry    Sinus bradycardia, Mobitz type 1 AV block.  - Personally Reviewed  ECG    None  today.  Physical Exam   General: Well developed, obese Caucasian female appearing in no acute distress. Head: Normocephalic, atraumatic.  Neck: Supple without bruits, JVD difficul to assess secondary to body habitus. Lungs:  Resp regular and unlabored, CTA without wheezing or rales Heart: Regular rhythm, bradycardiac rate, S1, S2, no S3, S4, 2/6 SEM along RUSB; no rub. Abdomen: Soft, non-tender, non-distended with normoactive bowel sounds. No hepatomegaly. No rebound/guarding. No obvious abdominal masses. Extremities: No clubbing or cyanosis, trace lower extremity edema bilaterally. SCD's in place.  Distal pedal pulses are 2+ bilaterally. Neuro: Alert and oriented X 3. Moves all extremities spontaneously. Psych: Normal affect.  Labs    Chemistry Recent Labs  Lab 06/05/18 1456 06/06/18 0157 06/07/18 0311  NA 139 140 138  K 4.0 4.3 4.8  CL 106 106 105  CO2 25 26 24   GLUCOSE 70 126* 260*  BUN 20 21 16   CREATININE 0.97 1.04* 0.99  CALCIUM 9.1 8.6* 8.7*  GFRNONAA 60* 55* 58*  GFRAA >60 >60 >60  ANIONGAP 8 8 9      Hematology Recent Labs  Lab 06/06/18 0538 06/07/18 0311 06/08/18 0215  WBC 10.7* 10.7* 10.5  RBC 3.87 3.98 3.96  HGB 9.3* 9.7* 9.6*  HCT 31.5* 32.8*  33.0*  MCV 81.4 82.4 83.3  MCH 24.0* 24.4* 24.2*  MCHC 29.5* 29.6* 29.1*  RDW 16.7* 16.5* 16.4*  PLT 239 227 206    Cardiac Enzymes Recent Labs  Lab 06/05/18 1947 06/05/18 2304 06/06/18 0157  TROPONINI 0.25* 0.34* 0.35*    Recent Labs  Lab 06/05/18 1505  TROPIPOC 0.11*     BNP Recent Labs  Lab 06/05/18 1526  BNP 197.0*     DDimer No results for input(s): DDIMER in the last 168 hours.   Radiology    Ct Head Wo Contrast  Result Date: 06/07/2018 CLINICAL DATA:  67 year old female with altered mental status and nausea. EXAM: CT HEAD WITHOUT CONTRAST TECHNIQUE: Contiguous axial images were obtained from the base of the skull through the vertex without intravenous contrast. COMPARISON:  Head CT  09/12/2017. FINDINGS: Brain: Cerebral volume is stable and within normal limits for age. No midline shift, ventriculomegaly, mass effect, evidence of mass lesion, intracranial hemorrhage or evidence of cortically based acute infarction. Gray-white matter differentiation is within normal limits throughout the brain. Vascular: Calcified atherosclerosis at the skull base. No suspicious intracranial vascular hyperdensity. Skull: Hyperostosis, compatible with normal variant. Bone mineralization is within normal limits. No acute osseous abnormality identified. Sinuses/Orbits: Visualized paranasal sinuses and mastoids are stable and well pneumatized. Other: No acute orbit or scalp soft tissue findings. IMPRESSION: Stable and normal for age non contrast CT appearance of the brain. Electronically Signed   By: Genevie Ann M.D.   On: 06/07/2018 12:46    Cardiac Studies   Echocardiogram: Study Conclusions  - Left ventricle: The cavity size was normal. Wall thickness was   increased in a pattern of moderate LVH. Systolic function was   normal. The estimated ejection fraction was in the range of 60%   to 65%. Wall motion was normal; there were no regional wall   motion abnormalities. Indeterminate diastolic function. - Aortic valve: Moderately calcified annulus. Trileaflet; mildly   calcified leaflets. - Mitral valve: Moderately calcified annulus. There was mild   regurgitation. - Left atrium: The atrium was mildly dilated. - Right atrium: Central venous pressure (est): 15 mm Hg. - Atrial septum: No defect or patent foramen ovale was identified. - Tricuspid valve: There was trivial regurgitation. - Pulmonary arteries: Systolic pressure could not be accurately   estimated. - Pericardium, extracardiac: A prominent pericardial fat pad was   present.  Procedures   LEFT HEART CATH AND CORONARY ANGIOGRAPHY  Conclusion     Prox RCA to Mid RCA lesion is 90% stenosed.  Dist RCA lesion is 80%  stenosed.  Mid RCA lesion is 80% stenosed.  Acute Mrg lesion is 40% stenosed.  Mid LM lesion is 55% stenosed.  Ost Cx lesion is 90% stenosed.  Prox Cx lesion is 85% stenosed.  Ost LAD to Prox LAD lesion is 50% stenosed.   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 There is  55% focal distal left main stenosis, calcification of the proximal LAD with 50% stenosis, 90% ostial left circumflex stenosis followed by 85% calcified proximal stenosis; and a large dominant RCA with severe eccentric calcification with 90% stenosis proximally, 80% stenosis in the region of the crux and 80% stenosis prior to the PDA takeoff with 40% marginal/PDA branch stenosis.  Preserved global LV contractility with inferior hypocontractility and ejection fraction of approximately 55%.  LVEDP 17 mm Hg.    RECOMMENDATION: Surgical consultation for CABG revascularization.  The patient will be started on heparin therapy 8 hours post sheath  removal.  She will also be started on IV nitroglycerin.  I have discussed the catheterization findings with Dr. Servando Snare.  She has been documented to have intermittent second-degree type I block. She will transported to [facility] for further evaluation and treatment prior to subsequent CABG revascularization. Recommend Aspirin 81mg  daily and will not start DAPT  due to need for surgery.      Patient Profile     67 y.o. female with past medical history of HTN, HLD, and type II DM who presented to [facility], ED on 06/05/2018 for evaluation of chest pain. Troponin value elevated to 0.34.  Assessment & Plan    1. NSTEMI - She presented for evaluation of chest pain which by initial history has mixed symptoms as pain has been occurring intermittently over the past month and can last for minutes up to hours and is sometimes worse with positional changes. Cyclic troponin values have continued to trend upwards from 0.11 to 0.25, and 0.34. She has been started on IV  Heparin.  - By cardiac cath she has severe 3 vessel CAD. CT surgery consulted and Dr. Everrett Coombe note appreciated.  - continue Heparin, ASA, and statin therapy. BB held given bradycardia. - from a standpoint of PCI options she would be very high risk. Could treat the 2 lesions in the RCA with atherectomy and stenting. Treating the ostial LCx would require unprotected atherectomy and stenting of the ostial LCx into the left main.  - if she is a surgical candidate this would be the best option based on her anatomy and age.   - Social situation and baseline functional status are very poor. Need to get a better handle on Neurologic status before being considered for CABG. ? New visual field cut, weakness, inability to stand, ? Dementia. Clarification of these issues will help to decide if she is a candidate for CABG.   2. 2nd Degree AV Block, Type 1  - HR has been variable from the upper-30's to 50's on telemetry with intermittent Wenckebach. She is asymptomatic. - hold any rate slowing medication and observe  3. HTN - BP has been variable from  since admission. She has been continued on PTA Losartan and Amlodipine. Toprol-XL held given episodes of bradycardia as outlined above. Continue to follow this admission.   4. HLD - FLP from 02/2018 showed total cholesterol 201, triglycerides 207, LDL 118 and HDL 42. She is now on Crestor 40mg  daily.   5. IDDM - Hgb A1c at 8.3 in 02/2018. Now 7.3.   Metformin held on admission. On SSI  - medication adjustment per admitting team.   6. Chronic Anemia - Baseline Hgb ~ 10.0 dating back over the past few years. Stable at 10.1 on admission. She denies any evidence of active bleeding.   7. Deconditioning - anticipate need to go to SNF post discharge for Rehab therapies  8. Murmur - she does have a SEM along the RUSB. Echocardiogram is shows AV sclerosis without stenosis.   For questions or updates, please contact [place] Please consult  [email or web address] for contact info under Cardiology/STEMI.   Signed, Garielle Mroz [place], [place] 9:26 AM 06/08/2018

## 2018-06-09 ENCOUNTER — Inpatient Hospital Stay (HOSPITAL_COMMUNITY): Payer: Medicare HMO

## 2018-06-09 DIAGNOSIS — R739 Hyperglycemia, unspecified: Secondary | ICD-10-CM

## 2018-06-09 DIAGNOSIS — I63432 Cerebral infarction due to embolism of left posterior cerebral artery: Secondary | ICD-10-CM

## 2018-06-09 DIAGNOSIS — I200 Unstable angina: Secondary | ICD-10-CM

## 2018-06-09 DIAGNOSIS — I6340 Cerebral infarction due to embolism of unspecified cerebral artery: Secondary | ICD-10-CM

## 2018-06-09 DIAGNOSIS — R413 Other amnesia: Secondary | ICD-10-CM

## 2018-06-09 DIAGNOSIS — H53461 Homonymous bilateral field defects, right side: Secondary | ICD-10-CM

## 2018-06-09 LAB — CBC
HEMATOCRIT: 35.3 % — AB (ref 36.0–46.0)
Hemoglobin: 10.5 g/dL — ABNORMAL LOW (ref 12.0–15.0)
MCH: 24.8 pg — ABNORMAL LOW (ref 26.0–34.0)
MCHC: 29.7 g/dL — ABNORMAL LOW (ref 30.0–36.0)
MCV: 83.5 fL (ref 78.0–100.0)
PLATELETS: 215 10*3/uL (ref 150–400)
RBC: 4.23 MIL/uL (ref 3.87–5.11)
RDW: 16.6 % — ABNORMAL HIGH (ref 11.5–15.5)
WBC: 10.5 10*3/uL (ref 4.0–10.5)

## 2018-06-09 LAB — LIPID PANEL
Cholesterol: 170 mg/dL (ref 0–200)
HDL: 40 mg/dL — ABNORMAL LOW (ref >40)
LDL Cholesterol: 97 mg/dL (ref 0–99)
Total CHOL/HDL Ratio: 4.3 RATIO
Triglycerides: 165 mg/dL — ABNORMAL HIGH (ref <150)
VLDL: 33 mg/dL (ref 0–40)

## 2018-06-09 LAB — HEPARIN LEVEL (UNFRACTIONATED)
HEPARIN UNFRACTIONATED: 0.35 [IU]/mL (ref 0.30–0.70)
Heparin Unfractionated: 0.61 IU/mL (ref 0.30–0.70)

## 2018-06-09 LAB — FOLATE RBC
Folate, Hemolysate: 473.4 ng/mL
Folate, RBC: 1470 ng/mL (ref >498)
HEMATOCRIT: 32.2 % — AB (ref 34.0–46.6)

## 2018-06-09 LAB — GLUCOSE, CAPILLARY
GLUCOSE-CAPILLARY: 125 mg/dL — AB (ref 70–99)
GLUCOSE-CAPILLARY: 233 mg/dL — AB (ref 70–99)
GLUCOSE-CAPILLARY: 234 mg/dL — AB (ref 70–99)
Glucose-Capillary: 141 mg/dL — ABNORMAL HIGH (ref 70–99)
Glucose-Capillary: 160 mg/dL — ABNORMAL HIGH (ref 70–99)

## 2018-06-09 MED ORDER — IOPAMIDOL (ISOVUE-370) INJECTION 76%
INTRAVENOUS | Status: AC
  Filled 2018-06-09: qty 50

## 2018-06-09 NOTE — Progress Notes (Signed)
PT Cancellation Note  Patient Details Name: Mary Raymond MRN: [id number] DOB: 1951-02-28   Cancelled Treatment:    Reason Eval/Treat Not Completed: Other (comment)(pt bathing will reattempt)   [place] B Jonnie Kubly 06/09/2018, 10:31 AM Elwyn Reach, PT Acute Rehabilitation Services Pager: [phone] Office: [phone]

## 2018-06-09 NOTE — NC FL2 (Signed)
[place] LEVEL OF CARE SCREENING TOOL     IDENTIFICATION  Patient Name: Mary Raymond Birthdate: 22-Oct-1950 Sex: female Admission Date (Current Location): 06/05/2018  [facility] and [place] Number:  Herbalist and Address:  The . [facility] [place] [address] [place], [place] [address]      Provider Number: [id number]  Attending Physician Name and Address:  Desiree Hane, MD  Relative Name and Phone Number:  Erinn Mendosa, daughter, [phone]    Current Level of Care: Hospital Recommended Level of Care: [place] Prior Approval Number:    Date Approved/Denied:   PASRR Number: [id number]  Discharge Plan: SNF    Current Diagnoses: Patient Active Problem List   Diagnosis Date Noted  . Cerebral embolism with cerebral infarction 06/09/2018  . NSTEMI (non-ST elevated myocardial infarction) ([place])   . Chest pain 06/05/2018  . Elevated troponin 06/05/2018  . Noncompliance 11/05/2017  . Genital herpes 08/02/2016  . Pain medication agreement signed 05/01/2016  . Uncomplicated opioid dependence ([place]) 05/01/2016  . Chronic back pain 08/10/2015  . 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 Depression 11/15/2014  . GERD (gastroesophageal reflux disease) 11/15/2014  . Hypertension associated with diabetes ([place])   . Hyperlipidemia associated with type 2 diabetes mellitus ([place])   . Diabetes mellitus type 2, uncontrolled, without complications ([place])   . Anxiety   . Obesity, morbid, BMI 40.0-49.9 ([place])   . Allergy   . Asthma   . Vitamin D deficiency   . Neuropathy   . Hypothyroidism 02/25/2013    Orientation RESPIRATION BLADDER Height & Weight     Self, Time, Situation, Place  Normal Continent, External catheter Weight: 269 lb 14.4 oz (122.4 kg) Height:  5\' 5"  (165.1 cm)  BEHAVIORAL SYMPTOMS/MOOD NEUROLOGICAL BOWEL NUTRITION STATUS      Continent Diet(please see DC summary)  AMBULATORY  STATUS COMMUNICATION OF NEEDS Skin   Limited Assist Verbally Normal                       Personal Care Assistance Level of Assistance  Bathing, Feeding, Dressing Bathing Assistance: Limited assistance Feeding assistance: Independent Dressing Assistance: Limited assistance     Functional Limitations Info  Sight, Hearing, Speech Sight Info: Impaired Hearing Info: Adequate Speech Info: Adequate    SPECIAL CARE FACTORS FREQUENCY  PT (By licensed PT), OT (By licensed OT)     PT Frequency: 5x/week OT Frequency: 5x/week            Contractures Contractures Info: Not present    Additional Factors Info  Code Status, Allergies, Insulin Sliding Scale Code Status Info: Full Allergies Info: Atorvastatin   Insulin Sliding Scale Info: novlog at bedtime and 3x/day with meals, lantus 2x/day       Current Medications (06/09/2018):  This is the current hospital active medication list Current Facility-Administered Medications  Medication Dose Route Frequency Provider Last Rate Last Dose  . 0.9 %  sodium chloride infusion  250 mL Intravenous PRN Troy Sine, MD      . acetaminophen (TYLENOL) tablet 650 mg  650 mg Oral Q4H PRN Johnson-Pitts, Endia, MD   650 mg at 06/09/18 1241  . acyclovir (ZOVIRAX) tablet 400 mg  400 mg Oral Daily Johnson-Pitts, Endia, MD   400 mg at 06/09/18 0903  . albuterol (PROVENTIL) (2.5 MG/3ML) 0.083% nebulizer solution 3 mL  3 mL Inhalation Q6H PRN Johnson-Pitts, Endia, MD      . ALPRAZolam (  XANAX) tablet 0.5 mg  0.5 mg Oral BID PRN Johnson-Pitts, Endia, MD   0.5 mg at 06/09/18 1241  . amLODipine (NORVASC) tablet 10 mg  10 mg Oral Daily Johnson-Pitts, Endia, MD   10 mg at 06/09/18 0905  . aspirin EC tablet 81 mg  81 mg Oral Daily Erma Heritage, PA-C   81 mg at 06/09/18 1027  . fluticasone (FLONASE) 50 MCG/ACT nasal spray 2 spray  2 spray Each Nare Daily Johnson-Pitts, Endia, MD   2 spray at 06/09/18 0909  . furosemide (LASIX) tablet 20 mg  20 mg  Oral Daily Johnson-Pitts, Endia, MD   20 mg at 06/09/18 0905  . gi cocktail (Maalox,Lidocaine,Donnatal)  30 mL Oral QID PRN Johnson-Pitts, Endia, MD      . heparin ADULT infusion 100 units/mL (25000 units/25mL sodium chloride 0.45%)  1,800 Units/hr Intravenous Continuous Lorenda Ishihara, RPH 18 mL/hr at 06/09/18 1350 1,800 Units/hr at 06/09/18 1350  . HYDROcodone-acetaminophen (NORCO) 7.5-325 MG per tablet 1 tablet  1 tablet Oral QHS Johnson-Pitts, Endia, MD   1 tablet at 06/08/18 2321  . insulin aspart (novoLOG) injection 0-5 Units  0-5 Units Subcutaneous QHS Roxan Hockey, MD   2 Units at 06/07/18 0047  . insulin aspart (novoLOG) injection 0-9 Units  0-9 Units Subcutaneous TID WC Roxan Hockey, MD   3 Units at 06/09/18 1238  . insulin aspart (novoLOG) injection 15 Units  15 Units Subcutaneous TID WC Roxan Hockey, MD   15 Units at 06/09/18 1237  . insulin glargine (LANTUS) injection 25 Units  25 Units Subcutaneous BID Roxan Hockey, MD   25 Units at 06/09/18 1329  . ipratropium-albuterol (DUONEB) 0.5-2.5 (3) MG/3ML nebulizer solution 3 mL  3 mL Inhalation Q6H PRN Johnson-Pitts, Endia, MD      . levothyroxine (SYNTHROID, LEVOTHROID) tablet 100 mcg  100 mcg Oral QAC breakfast Johnson-Pitts, Endia, MD   100 mcg at 06/09/18 0904  . LORazepam (ATIVAN) injection 1 mg  1 mg Intravenous Q4H PRN Oretha Milch D, MD      . losartan (COZAAR) tablet 100 mg  100 mg Oral Daily Johnson-Pitts, Endia, MD   100 mg at 06/09/18 0903  . montelukast (SINGULAIR) tablet 10 mg  10 mg Oral QHS Johnson-Pitts, Endia, MD   10 mg at 06/08/18 2325  . morphine 2 MG/ML injection 2 mg  2 mg Intravenous Q2H PRN Johnson-Pitts, Endia, MD   2 mg at 06/07/18 0827  . nitroGLYCERIN 50 mg in dextrose 5 % 250 mL (0.2 mg/mL) infusion  0-200 mcg/min Intravenous Titrated Troy Sine, MD   Stopped at 06/08/18 0919  . ondansetron (ZOFRAN) injection 4 mg  4 mg Intravenous Q6H PRN Johnson-Pitts, Endia, MD   4 mg at 06/07/18 1256   . pantoprazole (PROTONIX) EC tablet 80 mg  80 mg Oral Daily Johnson-Pitts, Endia, MD   80 mg at 06/09/18 0905  . rosuvastatin (CRESTOR) tablet 40 mg  40 mg Oral QHS Erma Heritage, PA-C   40 mg at 06/08/18 2321  . sodium chloride flush (NS) 0.9 % injection 3 mL  3 mL Intravenous Q12H Troy Sine, MD   3 mL at 06/09/18 0910  . sodium chloride flush (NS) 0.9 % injection 3 mL  3 mL Intravenous PRN Troy Sine, MD         Discharge Medications: Please see discharge summary for a list of discharge medications.  Relevant Imaging Results:  Relevant Lab Results:   Additional Information SSN: [id number]  [name], LCSW

## 2018-06-09 NOTE — Evaluation (Signed)
Physical Therapy Evaluation Patient Details Name: Mary Raymond MRN: [id number] DOB: October 17, 1950 Today's Date: 06/09/2018   History of Present Illness  67 y.o. female admitted to [facility] 9/19 with NSTEMI with heart cath on 9/20, and pt with right visual loss (9/22) and MRI showing small subcentimeter left occipital infarcts. PMHx:  HTN, HLD, type II DM, obesity, gout  Clinical Impression  Pt pleasant on arrival, willing to participate with therapy. Pt A&Ox4 but with noted short term memory loss within session. Pt reports feeling "off" for months and that she feels people think she is "crazy". Pt reports she "lost a day" and is unsure of when this occurred with only retelling daughter's account. Pt mod I for bed mobility and min guard for transfers and ambulation. Use of RW today able to ambulate 20 ft, but poor awareness of activity tolerance with limited notice of need to sit (will benefit from chair follow). Pt reports baseline ambulation is approximately 30 ft with a single point cane and seated rest breaks. Would benefit from historian confirmation by daughter. Pt with decreased strength, mobility, balance and activity tolerance (see Pt problem list below for all). Pt will benefit acutely from skilled therapy to improve on above deficits to maximize functional mobility, independence, and safety.       Follow Up Recommendations SNF;Supervision for mobility/OOB    Equipment Recommendations  Rolling walker with 5" wheels    Recommendations for Other Services       Precautions / Restrictions Precautions Precautions: Fall Precaution Comments: Right visual feild cut Restrictions Weight Bearing Restrictions: No      Mobility  Bed Mobility Overal bed mobility: Modified Independent Bed Mobility: Rolling;Sidelying to Sit Rolling: Modified independent (Device/Increase time) Sidelying to sit: Modified independent (Device/Increase time)       General bed mobility comments: use of  rail to roll and rise  Transfers Overall transfer level: Needs assistance Equipment used: Rolling walker (2 wheeled) Transfers: Sit to/from Stand Sit to Stand: Min guard         General transfer comment: min guard for sit to stand, three trials, cues to scoot to edge of bed or chair prior to rise. On first trial Pt report of dizziness with loss of word finding for ~3 seconds, pt sat back down and recovered, no further incident   Ambulation/Gait Ambulation/Gait assistance: Min guard Gait Distance (Feet): 20 Feet Assistive device: Rolling walker (2 wheeled) Gait Pattern/deviations: Step-through pattern;Decreased stride length;Trunk flexed Gait velocity: decreased  Gait velocity interpretation: <1.8 ft/sec, indicate of risk for recurrent falls General Gait Details: trunk flexed, use of RW with min guard. Pt fatigues quickly with need to sit without much notice. may benefit from a chair follow to progress ambulation.   Stairs            Wheelchair Mobility    Modified Rankin (Stroke Patients Only) Modified Rankin (Stroke Patients Only) Pre-Morbid Rankin Score: Moderate disability Modified Rankin: Moderately severe disability     Balance Overall balance assessment: Needs assistance Sitting-balance support: No upper extremity supported;Feet supported Sitting balance-Leahy Scale: Fair Sitting balance - Comments: Able to maintain static sitting. Decreased balance and safety due to dizziness and pt with moments of posterior leaning   Standing balance support: Bilateral upper extremity supported;During functional activity Standing balance-Leahy Scale: Fair Standing balance comment: Able to maintain static standing  Pertinent Vitals/Pain Pain Assessment: Faces Faces Pain Scale: Hurts little more Pain Location: head Pain Descriptors / Indicators: Headache Pain Intervention(s): Premedicated before session;Monitored during session;Limited  activity within patient's tolerance;Repositioned    Home Living Family/patient expects to be discharged to:: Private residence Living Arrangements: Alone Available Help at Discharge: Family;Available PRN/intermittently Type of Home: House Home Access: Stairs to enter Entrance Stairs-Rails: None Entrance Stairs-Number of Steps: 1 Home Layout: One level Home Equipment: Cane - single point;Wheelchair - manual;Tub bench      Prior Function Level of Independence: Independent with assistive device(s)         Comments: Pt performs ADLs and simple IADLs. "I barely clean." Occasionally uses cane for functional mobility. Daughter assists with grocery shopping.      Hand Dominance   Dominant Hand: Right    Extremity/Trunk Assessment   Upper Extremity Assessment Upper Extremity Assessment: Defer to OT evaluation    Lower Extremity Assessment Lower Extremity Assessment: Generalized weakness    Cervical / Trunk Assessment Cervical / Trunk Assessment: Other exceptions Cervical / Trunk Exceptions: Increased body habitus  Communication   Communication: No difficulties  Cognition Arousal/Alertness: Awake/alert Behavior During Therapy: WFL for tasks assessed/performed Overall Cognitive Status: Impaired/Different from baseline Area of Impairment: Following commands;Memory;Problem solving;Safety/judgement                     Memory: Decreased short-term memory Following Commands: Follows one step commands with increased time Safety/Judgement: Decreased awareness of safety;Decreased awareness of deficits   Problem Solving: Slow processing;Requires verbal cues General Comments: Pt requiring increased time and cues presenting with slow processing. Pt with decreased ST memory and requiring Min cues for recall.      General Comments General comments (skin integrity, edema, etc.): Pt verbalizing concern of having to go to a "facility if her daughter has her way". Discussed  importance of ensuring pt is safe before returning home.     Exercises     Assessment/Plan    PT Assessment Patient needs continued PT services  PT Problem List Decreased strength;Decreased cognition;Decreased range of motion;Decreased knowledge of use of DME;Decreased activity tolerance;Decreased safety awareness;Decreased balance;Decreased mobility;Decreased coordination;Cardiopulmonary status limiting activity       PT Treatment Interventions DME instruction;Balance training;Gait training;Stair training;Cognitive remediation;Functional mobility training;Patient/family education;Therapeutic activities;Therapeutic exercise;Wheelchair mobility training    PT Goals (Current goals can be found in the Care Plan section)  Acute Rehab PT Goals Patient Stated Goal: return home PT Goal Formulation: With patient Time For Goal Achievement: 06/23/18 Potential to Achieve Goals: Fair    Frequency Min 3X/week   Barriers to discharge Decreased caregiver support Lives alone, daughter can help occasionally when not working. Unsure of the dynamic and amount of support able to provide. Would benefit to confirm history with daughter.     Co-evaluation               AM-PAC PT "6 Clicks" Daily Activity  Outcome Measure Difficulty turning over in bed (including adjusting bedclothes, sheets and blankets)?: Unable Difficulty moving from lying on back to sitting on the side of the bed? : Unable Difficulty sitting down on and standing up from a chair with arms (e.g., wheelchair, bedside commode, etc,.)?: A Lot Help needed moving to and from a bed to chair (including a wheelchair)?: A Little Help needed walking in hospital room?: A Little Help needed climbing 3-5 steps with a railing? : A Lot 6 Click Score: 12    End of Session Equipment Utilized During Treatment: Gait  belt Activity Tolerance: Patient limited by fatigue;Patient tolerated treatment well Patient left: in chair;with chair alarm  set;with nursing/sitter in room;with call bell/phone within reach Nurse Communication: Mobility status PT Visit Diagnosis: Unsteadiness on feet (R26.81);Other abnormalities of gait and mobility (R26.89);Muscle weakness (generalized) (M62.81);Other symptoms and signs involving the nervous system (R29.898)    Time: 2458-0998 PT Time Calculation (min) (ACUTE ONLY): 37 min   Charges:   PT Evaluation $PT Eval Moderate Complexity: 1 Mod PT Treatments $Therapeutic Activity: 8-22 mins        Samuella Bruin, [place]  Acute Rehab [phone]   Samuella Bruin 06/09/2018, 2:01 PM

## 2018-06-09 NOTE — Progress Notes (Signed)
[place] for heparin Indication: ACS/STEMI  Allergies  Allergen Reactions  . Atorvastatin Other (See Comments)    Leg weakness.    Patient Measurements: Height: 5\' 5"  (165.1 cm) Weight: 269 lb 14.4 oz (122.4 kg) IBW/kg (Calculated) : 57 Heparin Dosing Weight: 88 kg  Vital Signs: Temp: 97.9 F (36.6 C) (09/23 1215) Temp Source: Oral (09/23 1215) BP: 182/50 (09/23 0900) Pulse Rate: 68 (09/23 0900)  Labs: Recent Labs    06/07/18 0311 06/07/18 0855 06/08/18 0215 06/09/18 0921 06/09/18 0923  HGB 9.7*  --  9.6*  --  10.5*  HCT 32.8*  --  33.0*  --  35.3*  PLT 227  --  206  --  215  HEPARINUNFRC  --  0.26* 0.32 0.61  --   CREATININE 0.99  --   --   --   --     Estimated Creatinine Clearance: 73.4 mL/min (by C-G formula based on SCr of 0.99 mg/dL).   Medical History: Past Medical History:  Diagnosis Date  . Allergy   . Anxiety   . Asthma   . Diabetes mellitus without complication ([place])   . Gout   . Hyperlipidemia   . Hypertension   . Hypothyroid   . Neuropathy   . Obesity   . Vitamin D deficiency    Assessment: Pharmacy consulted to dose heparin in patient for ACS/STEMI.  She is now s/p cath which showed severe 3 vessel disease.  She has been turned down for cardiac surgery because of poor functional capacity, multiple comorbid medical problems, and new acute stroke this admission. Pharmacy consulted to manage heparin post-cath.   9/23: AM heparin level today therapeutic at 0.61 on 1900 units/hr. Hgb inc to 10.5, Plt stable. No bleeding or infusion issues noted. Will reduce heparin goal to 0.3-0.5 with risk of hemorrhagic conversion given recent CVA.  Goal of Therapy:  Heparin level 0.3-0.5 units/ml Monitor platelets by anticoagulation protocol: Yes   Plan:  Reduce heparin drip to 1800 units/hr Heparin level for 9/23 @ 2000 Monitor daily heparin level, CBC, s/sx of bleeding F/u plans for intervention  Thank  you for involving pharmacy in this patient's care.  Janae Bridgeman, PharmD PGY1 Pharmacy Resident Phone: [phone] 06/09/2018 1:44 PM

## 2018-06-09 NOTE — Progress Notes (Signed)
[place] for heparin Indication: ACS/STEMI  Allergies  Allergen Reactions  . Atorvastatin Other (See Comments)    Leg weakness.    Patient Measurements: Height: 5\' 5"  (165.1 cm) Weight: 269 lb 14.4 oz (122.4 kg) IBW/kg (Calculated) : 57 Heparin Dosing Weight: 88 kg  Vital Signs: Temp: 97.8 F (36.6 C) (09/23 0743) Temp Source: Oral (09/23 0743) BP: 182/50 (09/23 0900) Pulse Rate: 68 (09/23 0900)  Labs: Recent Labs    06/07/18 0311 06/07/18 0855 06/08/18 0215 06/09/18 0921 06/09/18 0923  HGB 9.7*  --  9.6*  --  10.5*  HCT 32.8*  --  33.0*  --  35.3*  PLT 227  --  206  --  215  HEPARINUNFRC  --  0.26* 0.32 0.61  --   CREATININE 0.99  --   --   --   --     Estimated Creatinine Clearance: 73.4 mL/min (by C-G formula based on SCr of 0.99 mg/dL).   Medical History: Past Medical History:  Diagnosis Date  . Allergy   . Anxiety   . Asthma   . Diabetes mellitus without complication ([place])   . Gout   . Hyperlipidemia   . Hypertension   . Hypothyroid   . Neuropathy   . Obesity   . Vitamin D deficiency    Assessment: Pharmacy consulted to dose heparin in patient for ACS/STEMI.  She is now s/p cath which showed severe 3 vessel disease.  She has been turned down for cardiac surgery because of poor functional capacity, multiple comorbid medical problems, and new acute stroke this admission. Pharmacy consulted to manage heparin post-cath.  9/23: AM heparin level today therapeutic at 0.61 on 1900 units/hr. Hgb inc to 10.5, Plt stable. No bleeding or infusion issues noted.  Goal of Therapy:  Heparin level 0.3-0.7 units/ml Monitor platelets by anticoagulation protocol: Yes   Plan:  Continue heparin drip at 1900 units/hr Monitor daily heparin level, CBC, s/sx of bleeding F/u plans for intervention  Thank you for involving pharmacy in this patient's care.  Janae Bridgeman, PharmD PGY1 Pharmacy Resident Phone: [phone] 06/09/2018 10:27 AM

## 2018-06-09 NOTE — Progress Notes (Addendum)
STROKE TEAM PROGRESS NOTE   HISTORY OF PRESENT ILLNESS (per record) Mrs Torrance was admitted on 9/19 for chest pain and had a cardiac cath on 9/20. Since that time she thought the TV was not in good view, but did not frankly notice right periferal vision loss till a provider was standing directly on her right and she could not see him. She has some baseline memory loss issues and mild confusion at times that family is concerned that may be some early dementia. 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   SUBJECTIVE (INTERVAL HISTORY) Her family is at the bedside and contribute to further HPI. After some deeper questioning, the pt tells me in hindsight, she may have had a vision problem since just after the heart cath on 9/20, but just didn't realize it to the exact moment when it was noted by provider at bedside on 9/22.   ROS: ROS was performed and is negative except as noted in HPI  OBJECTIVE Vitals:   06/09/18 1430 06/09/18 1500 06/09/18 1600 06/09/18 1610  BP: (!) 148/59 (!) 136/56 110/87   Pulse: (!) 120 (!) 130 (!) 50   Resp: (!) 21 (!) 21 20   Temp:    97.8 F (36.6 C)  TempSrc:    Oral  SpO2: 93% 95% 94%   Weight:      Height:        CBC:  Recent Labs  Lab 06/08/18 0215 06/09/18 0923  WBC 10.5 10.5  HGB 9.6* 10.5*  HCT 33.0* 35.3*  MCV 83.3 83.5  PLT 206 151    Basic Metabolic Panel:  Recent Labs  Lab 06/06/18 0157 06/07/18 0311  NA 140 138  K 4.3 4.8  CL 106 105  CO2 26 24  GLUCOSE 126* 260*  BUN 21 16  CREATININE 1.04* 0.99  CALCIUM 8.6* 8.7*    Lipid Panel:     Component Value Date/Time   CHOL 170 06/09/2018 0650   CHOL 201 (H) 02/25/2018 1033   CHOL 248 (H) 02/23/2013 1159   TRIG 165 (H) 06/09/2018 0650   TRIG 274 (H) 09/16/2013 0846   TRIG 359 (H) 02/23/2013 1159   HDL 40 (L) 06/09/2018 0650   HDL 42 02/25/2018 1033   HDL 45 09/16/2013 0846   HDL 45 02/23/2013 1159   CHOLHDL 4.3 06/09/2018 0650   VLDL 33 06/09/2018 0650   LDLCALC 97 06/09/2018  0650   LDLCALC 118 (H) 02/25/2018 1033   LDLCALC 145 (H) 09/16/2013 0846   LDLCALC 131 (H) 02/23/2013 1159   HgbA1c:  Lab Results  Component Value Date   HGBA1C 7.3 (H) 06/05/2018   Urine Drug Screen: No results found for: LABOPIA, COCAINSCRNUR, LABBENZ, AMPHETMU, THCU, LABBARB  Alcohol Level No results found for: [facility]  IMAGING   Mr Brain Wo Contrast  Result Date: 06/08/2018 CLINICAL DATA:  Altered mental status, nausea. Suspect stroke. History of hypertension, hyperlipidemia, diabetes. EXAM: MRI HEAD WITHOUT CONTRAST TECHNIQUE: Multiplanar, multiecho pulse sequences of the brain and surrounding structures were obtained without intravenous contrast. COMPARISON:  CT HEAD June 07, 2018. FINDINGS: INTRACRANIAL CONTENTS: Subcentimeter foci reduced diffusion LEFT occipital lobe with low ADC values. No susceptibility artifact to suggest hemorrhage. Ventricles and sulci are normal for patient's age. A fused subcentimeter supratentorial and patchy pontine white matter FLAIR T2 hyperintensities. No midline shift, mass effect or masses. No abnormal extra-axial fluid collections. VASCULAR: Normal major intracranial vascular flow voids present at skull base. Loss of LEFT mid to distal posterior cerebral  artery flow void versus artifact. SKULL AND UPPER CERVICAL SPINE: No abnormal sellar expansion. No suspicious calvarial bone marrow signal. Craniocervical junction maintained. SINUSES/ORBITS: The mastoid air-cells and included paranasal sinuses are well-aerated.The included ocular globes and orbital contents are non-suspicious. OTHER: None. IMPRESSION: 1. 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 2. Occluded LEFT mid to distal PCA versus artifact. Consider CTA versus MRA head. 3. Mild chronic small vessel ischemic changes. 4. These results will be called to the ordering clinician or representative by the Radiologist Assistant, and communication documented in the PACS or zVision Dashboard.  Electronically Signed   By: Elon Alas M.D.   On: 06/08/2018 13:23    PHYSICAL EXAM Blood pressure 110/87, pulse (!) 50, temperature 97.8 F (36.6 C), temperature source Oral, resp. rate 20, height 5\' 5"  (1.651 m), weight 122.4 kg, SpO2 94 %.  HEENT-  Normocephalic, no lesions, without obvious abnormality.  Normal external eye and conjunctiva.  Cardiovascular- S1-S2 audible, pulses palpable throughout   Lungs-no rhonchi or wheezing noted, no excessive working breathing.  Saturations within normal limits Abdomen- All 4 quadrants palpated and nontender Extremities- Warm, dry and intact Musculoskeletal-no joint tenderness, deformity or swelling Skin-warm and dry, no hyperpigmentation, vitiligo, or suspicious lesions  Neurological Examination Mental Status: Alert, oriented, thought content appropriate.  Speech fluent without evidence of aphasia.  Able to follow  commands without difficulty. Cranial Nerves: II:  Right eye with right peripheral field cut, vision is returning, no problems with left eye. III,IV, VI: ptosis not present, extra-ocular motions intact bilaterally, pupils equal, round, reactive to light  V,VII: smile asymmetric, right facial droop noted, facial light touch sensation normal bilaterally VIII: hearing normal bilaterally IX,X: uvula rises symmetrically XI: bilateral shoulder shrug XII: midline tongue extension Motor: Right :  Upper extremity   5/5              Left:     Upper extremity   5/5             Lower extremity   5/5              Lower extremity   5/5 Tone and bulk:normal tone throughout; no atrophy noted Sensory: Pinprick and light touch intact throughout, bilaterally Deep Tendon Reflexes: 2+ and symmetric biceps and patella Plantars: Right: downgoing                                Left: downgoing Cerebellar: normal finger-to-nose, normal rapid alternating movements and normal heel-to-shin test Gait: deferred      HOME MEDICATIONS:   Medications Prior to Admission  Medication Sig Dispense Refill  . acyclovir (ZOVIRAX) 400 MG tablet TAKE 1 TABLET EVERY DAY 90 tablet 0  . albuterol (PROVENTIL HFA;VENTOLIN HFA) 108 (90 Base) MCG/ACT inhaler Inhale 1-2 puffs into the lungs every 6 (six) hours as needed for wheezing or shortness of breath.    . ALPRAZolam (XANAX) 0.5 MG tablet TAKE 1 TABLET 2 TIMES A DAY AS NEEDED FOR ANXIETY 45 tablet 2  . amLODipine (NORVASC) 10 MG tablet TAKE 1 TABLET EVERY DAY 90 tablet 0  . B-D UF III MINI PEN NEEDLES 31G X 5 MM MISC USE AS INSTRUCTED TO GIVE LANTUS INJECTION TWICE DAILY 180 each 1  . fluticasone (FLONASE) 50 MCG/ACT nasal spray USE 2 SPRAYS IN EACH NOSTRIL EVERY DAY 48 g 1  . furosemide (LASIX) 20 MG tablet TAKE 1 TABLET (20 MG TOTAL) BY MOUTH  DAILY. 90 tablet 0  . HYDROcodone-acetaminophen (NORCO) 7.5-325 MG tablet Take 1 tablet by mouth at bedtime. 30 tablet 0  . Ipratropium-Albuterol (COMBIVENT RESPIMAT) 20-100 MCG/ACT AERS respimat Inhale 1 puff into the lungs every 6 (six) hours as needed for wheezing. 12 g 1  . LANTUS SOLOSTAR 100 UNIT/ML Solostar Pen INJECT 45 UNITS SUBCUTANEOUSLY 2 TIMES DAILY 90 mL 0  . levothyroxine (SYNTHROID, LEVOTHROID) 50 MCG tablet TAKE 1 TABLET EVERY DAY BEFORE BREAKFAST 90 tablet 3  . losartan (COZAAR) 100 MG tablet TAKE 1 TABLET EVERY DAY 90 tablet 0  . metoprolol succinate (TOPROL-XL) 25 MG 24 hr tablet Take 1 tablet (25 mg total) by mouth daily. 90 tablet 3  . montelukast (SINGULAIR) 10 MG tablet TAKE 1 TABLET (10 MG TOTAL) BY MOUTH AT BEDTIME. 90 tablet 1  . NOVOLOG FLEXPEN 100 UNIT/ML FlexPen INJECT 45 UNITS SUBCUTANEOUSLY THREE TIMES DAILY WITH MEALS 120 mL 1  . omeprazole (PRILOSEC) 40 MG capsule TAKE 1 CAPSULE EVERY DAY 90 capsule 0  . rosuvastatin (CRESTOR) 20 MG tablet Take 1 tablet (20 mg total) by mouth at bedtime. 30 tablet 11  . ACCU-CHEK SOFTCLIX LANCETS lancets Test BG TID 300 each 3      HOSPITAL MEDICATIONS:  . acyclovir  400 mg Oral  Daily  . amLODipine  10 mg Oral Daily  . aspirin EC  81 mg Oral Daily  . fluticasone  2 spray Each Nare Daily  . furosemide  20 mg Oral Daily  . HYDROcodone-acetaminophen  1 tablet Oral QHS  . insulin aspart  0-5 Units Subcutaneous QHS  . insulin aspart  0-9 Units Subcutaneous TID WC  . insulin aspart  15 Units Subcutaneous TID WC  . insulin glargine  25 Units Subcutaneous BID  . levothyroxine  100 mcg Oral QAC breakfast  . losartan  100 mg Oral Daily  . montelukast  10 mg Oral QHS  . pantoprazole  80 mg Oral Daily  . rosuvastatin  40 mg Oral QHS  . sodium chloride flush  3 mL Intravenous Q12H    ALLERGIES Allergies  Allergen Reactions  . Atorvastatin Other (See Comments)    Leg weakness.   ASSESSMENT/PLAN Ms. TERESEA DONLEY is a 67 y.o. female with Lt PCA stroke in setting of post heart cath. The etiology is most likely peri-procedural.  Stroke:  MRI head -Lt PCA stroke  CTA Head/neck - pending  2D Echo -LVH, 60% EF, LA dilation  Diet heart healthy  No anticoag/antiplt prior to admission, now onASA  Patient counseled to be compliant with her antithrombotic medications  Ongoing aggressive stroke risk factor management  Therapy recommendations:  pending  Disposition:  Pending  Hypertension  Stable . No role in permissive HTN at this time, avoid hypotension however.  . Long-term BP goal normotensive  Hyperlipidemia  Lipid lowering medication PTA:  Crestor  LDL 97, goal < 70  Current lipid lowering medication:Crestor  Continue statin at discharge  Diabetes  HgbA1c 7.3, goal < 7.0  Controlled  Other Stroke Risk Factors  Advanced age  Obesity, Body mass index is 44.91 kg/m., recommend weight loss, diet and exercise as appropriate   Family hx stroke (parent)  Coronary artery disease  Suspect Obstructive sleep apnea, not on CPAP at home; recommend out pt sleep study  Other Active Problems Non-STEMI with RCA and L Cx with multivessel  complex CAD CV surgery advises no surgery d/t poor functional capacity and multiple comorbid issues. To note, the stroke is small and  should not be the stand alone factor to not proceed with CV sx.   Hospital day # 3  We will f/u on the CTA h/n Stroke education done with pt and family in room  Leon, ARNP-C To contact Stroke Continuity provider, please refer to [email or web address] After hours, contact General Neurology  ATTENDING NOTE: I reviewed above note and agree with the assessment and plan. Pt was seen and examined.   67 year old female with history of DM, HTN, HLD, obesity admitted on 06/05/2017 for chest pain.  Had cardiac cath 06/06/2018 which showed multi vessel CAD.  Cardiovascular surgeon consulted but deemed not a good candidate for CABG.  She was put on heparin IV along with aspirin 81.    After procedure patient first noticed right peripheral visual field deficit while watching TV, however she thought that was the problem of TV.  On 06/07/2018, she had head CT no acute abnormality.  However on 06/08/2018, the right visual field deficit continued, neurology consulted and MRI showed left MCA and PCA punctate infarcts.  EF 60 to 65%.  A1c 7.3 and LDL 97.  CTA head and neck pending.  Patient stroke around perioperative period, concerning for procedure related punctate infarcts.  However, possible cause of stroke also include non-STEMI and occult A. fib.  Recommend 30-day cardio event monitoring as outpatient to rule out A. fib.  Antiplatelet as per cardiology.  Continue statin.  Will follow.  Rosalin Hawking, MD PhD Stroke Neurology 06/09/2018 9:50 PM

## 2018-06-09 NOTE — Progress Notes (Signed)
[facility] for heparin Indication: ACS/STEMI  Allergies  Allergen Reactions  . Atorvastatin Other (See Comments)    Leg weakness.    Patient Measurements: Height: 5\' 5"  (165.1 cm) Weight: 269 lb 14.4 oz (122.4 kg) IBW/kg (Calculated) : 57 Heparin Dosing Weight: 88 kg  Vital Signs: Temp: 98.6 F (37 C) (09/23 1953) Temp Source: Oral (09/23 1953) BP: 185/55 (09/23 2100) Pulse Rate: 38 (09/23 2100)  Labs: Recent Labs    06/07/18 0311  06/08/18 0215 06/09/18 0921 06/09/18 0923 06/09/18 2036  HGB 9.7*  --  9.6*  --  10.5*  --   HCT 32.8*  --  33.0*  --  35.3*  --   PLT 227  --  206  --  215  --   HEPARINUNFRC  --    < > 0.32 0.61  --  0.35  CREATININE 0.99  --   --   --   --   --    < > = values in this interval not displayed.    Estimated Creatinine Clearance: 73.4 mL/min (by C-G formula based on SCr of 0.99 mg/dL).   Medical History: Past Medical History:  Diagnosis Date  . Allergy   . Anxiety   . Asthma   . Diabetes mellitus without complication ([place])   . Gout   . Hyperlipidemia   . Hypertension   . Hypothyroid   . Neuropathy   . Obesity   . Vitamin D deficiency    Assessment: Pharmacy consulted to dose heparin in patient for ACS/STEMI.  She is now s/p cath which showed severe 3 vessel disease.  She has been turned down for cardiac surgery because of poor functional capacity, multiple comorbid medical problems, and new acute stroke this admission. Pharmacy consulted to manage heparin post-cath.   Heparin level this evening at goal. No bleeding or infusion issues noted. Will reduce heparin goal to 0.3-0.5 with risk of hemorrhagic conversion given recent CVA.  Goal of Therapy:  Heparin level 0.3-0.5 units/ml Monitor platelets by anticoagulation protocol: Yes   Plan:  Continue heparin drip at 1800 units/hr Monitor daily heparin level, CBC, s/sx of bleeding F/u plans for intervention  Thank you for  involving pharmacy in this patient's care.  Erin Hearing PharmD., BCPS Clinical Pharmacist 06/09/2018 10:11 PM

## 2018-06-09 NOTE — Evaluation (Signed)
Occupational Therapy Evaluation Patient Details Name: Mary Raymond MRN: [id number] DOB: 07/14/1951 Today's Date: 06/09/2018    History of Present Illness 67 y.o. female admitted to [facility] 9/19 with NSTEMI with heart cath on 9/20, and pt with right visual loss (9/22) and MRI showing small subcentimeter left occipital infarcts. PMHx:  HTN, HLD, type II DM, obesity, gout   Clinical Impression   PTA, pt was living alone and was performing ADLs and light IADLs (including driving). Pt currently requiring Min Guard A for UB ADLs, Mod A for LB ADLs, and Min Guar A for functional transfer with RW. Pt presenting with right visual field deficits, dizziness, and decreased balance. Pt would benefit from further acute OT to facilitate safe dc. Recommend dc to CIR for ST rehab and further OT to optimize safety, independence with ADLs, and return to PLOF.      Follow Up Recommendations  CIR;Supervision/Assistance - 24 hour    Equipment Recommendations  3 in 1 bedside commode;Tub/shower seat([place])    Recommendations for Other Services PT consult     Precautions / Restrictions Precautions Precautions: Other (comment) Precaution Comments: Right visual feild cut Restrictions Weight Bearing Restrictions: No      Mobility Bed Mobility Overal bed mobility: Needs Assistance Bed Mobility: Supine to Sit     Supine to sit: Supervision;HOB elevated     General bed mobility comments: supervision for safety and Min cues for problem solving and safety  Transfers Overall transfer level: Needs assistance Equipment used: Rolling walker (2 wheeled) Transfers: Sit to/from Stand Sit to Stand: Min guard         General transfer comment: Min Guard A for safety and cues for hand placement and safety    Balance Overall balance assessment: Needs assistance Sitting-balance support: No upper extremity supported;Feet supported Sitting balance-Leahy Scale: Fair Sitting balance - Comments: Able to  maintain static sitting. Decreased balance and safety dur to dizziness and pt with moments of posterior leaning   Standing balance support: Bilateral upper extremity supported;During functional activity Standing balance-Leahy Scale: Fair Standing balance comment: Able to maintain static standing                           ADL either performed or assessed with clinical judgement   ADL Overall ADL's : Needs assistance/impaired Eating/Feeding: Set up;Supervision/ safety;Sitting Eating/Feeding Details (indicate cue type and reason): Pt set up eating breakfast at end of session Grooming: Min guard;Standing   Upper Body Bathing: Min guard;Sitting   Lower Body Bathing: Moderate assistance;Sit to/from stand Lower Body Bathing Details (indicate cue type and reason): Pt with dizziness during movement and difficulty bending forward.  Upper Body Dressing : Min guard;Sitting Upper Body Dressing Details (indicate cue type and reason): Min Guard A for safety due to dizziness Lower Body Dressing: Moderate assistance;Sit to/from stand Lower Body Dressing Details (indicate cue type and reason): Pt with dizziness during movement and difficulty bending forward.  Toilet Transfer: Min guard;Ambulation;RW(Simulated to recliner) Armed forces technical officer Details (indicate cue type and reason): Min Guard A for safety. Pt continues to reports dizziness but demonstrating good strength for sit<>stand         Functional mobility during ADLs: Min guard;Rolling walker General ADL Comments: Pt with decreased functional performance due to dizziness and visual deficits.      Vision Baseline Vision/History: Wears glasses Wears Glasses: Reading only Patient Visual Report: Blurring of vision Vision Assessment?: Yes Eye Alignment: Within Functional Limits Ocular Range of  Motion: Within Functional Limits Alignment/Gaze Preference: Within Defined Limits Tracking/Visual Pursuits: Requires cues, head turns, or add eye  shifts to track;Unable to hold eye position out of midline;Other (comment)(visual fatigue and closes her eye often) Convergence: Impaired - to be further tested in functional context Visual Fields: Right inferior homonymous quadranopsia;Right visual field deficit Additional Comments: Pt presenting with visual fatigue and difficulty maintaining head position during tracking test. Pt presenting with visual field cut to right lower quatrant of visual field. Pt not seeing object coming from peripheral till 30degree from midline in upoper right quadrant. Pt not seeing object will ~midline coming from right lower quadrant. WFL for left side.      Perception     Praxis      Pertinent Vitals/Pain Pain Assessment: No/denies pain     Hand Dominance Right   Extremity/Trunk Assessment Upper Extremity Assessment Upper Extremity Assessment: Overall WFL for tasks assessed   Lower Extremity Assessment Lower Extremity Assessment: Defer to PT evaluation   Cervical / Trunk Assessment Cervical / Trunk Assessment: Other exceptions Cervical / Trunk Exceptions: Increased body habitus   Communication Communication Communication: No difficulties   Cognition Arousal/Alertness: Lethargic Behavior During Therapy: WFL for tasks assessed/performed Overall Cognitive Status: Impaired/Different from baseline Area of Impairment: Following commands;Memory;Problem solving                     Memory: Decreased short-term memory Following Commands: Follows one step commands inconsistently;Follows one step commands with increased time     Problem Solving: Slow processing;Requires verbal cues General Comments: Pt requiring increased time and cues presenting with slow processing. Pt with decreased ST memory and requiring Min cues for recall.   General Comments  SpO2 90s on RA. BP supine 174/55; sitting EOB 191/53; 180/63. Pt verablizing concern with returning to home and living alone. Pt stating "I don't  think I will ever be able to live alone again." Pt unsure of support from her children.     Exercises     Shoulder Instructions      Home Living Family/patient expects to be discharged to:: Private residence Living Arrangements: Alone Available Help at Discharge: Family;Available PRN/intermittently Type of Home: House       Home Layout: One level     Bathroom Shower/Tub: Tub/shower unit;Curtain   Biochemist, clinical: Standard     Home Equipment: Cane - single point          Prior Functioning/Environment Level of Independence: Independent with assistive device(s)        Comments: Pt performs ADLs and simple IADLs. "I barely clean." Occasionally uses cane for functional mobility. Daughter assists with grocery shopping.         OT Problem List: Decreased range of motion;Decreased activity tolerance;Impaired balance (sitting and/or standing);Decreased knowledge of use of DME or AE;Decreased knowledge of precautions;Impaired vision/perception;Obesity      OT Treatment/Interventions: Self-care/ADL training;Therapeutic exercise;Energy conservation;DME and/or AE instruction;Therapeutic activities;Patient/family education    OT Goals(Current goals can be found in the care plan section) Acute Rehab OT Goals Patient Stated Goal: "see normal again" OT Goal Formulation: With patient Time For Goal Achievement: 06/23/18 Potential to Achieve Goals: Good ADL Goals Pt Will Perform Grooming: with modified independence;standing Pt Will Perform Lower Body Dressing: with set-up;with supervision;sit to/from stand Pt Will Transfer to Toilet: with set-up;with supervision;bedside commode;ambulating Pt Will Perform Toileting - Clothing Manipulation and hygiene: with set-up;with supervision;sit to/from stand;sitting/lateral leans Pt Will Perform Tub/Shower Transfer: Tub transfer;ambulating;with min guard assist;shower seat(with or without shower  seat) Additional ADL Goal #1: Pt will demonstrate  compensatory vision techniques during ADLs with 2-3 cues.  OT Frequency: Min 3X/week   Barriers to D/C:            Co-evaluation              AM-PAC PT "6 Clicks" Daily Activity     Outcome Measure Help from another person eating meals?: None Help from another person taking care of personal grooming?: A Little Help from another person toileting, which includes using toliet, bedpan, or urinal?: A Little Help from another person bathing (including washing, rinsing, drying)?: A Lot Help from another person to put on and taking off regular upper body clothing?: A Little Help from another person to put on and taking off regular lower body clothing?: A Lot 6 Click Score: 17   End of Session Equipment Utilized During Treatment: Rolling walker;Gait belt Nurse Communication: Mobility status;Other (comment)(dizziness)  Activity Tolerance: Patient tolerated treatment well Patient left: in chair;with call bell/phone within reach;with nursing/sitter in room  OT Visit Diagnosis: Unsteadiness on feet (R26.81);Other abnormalities of gait and mobility (R26.89);Muscle weakness (generalized) (M62.81);Other symptoms and signs involving cognitive function;Low vision, both eyes (H54.2)                Time: 9629-5284 OT Time Calculation (min): 26 min Charges:  OT General Charges $OT Visit: 1 Visit OT Evaluation $OT Eval Moderate Complexity: 1 Mod OT Treatments $Self Care/Home Management : 8-22 mins  Stoney Karczewski MSOT, OTR/L Acute Rehab Pager: [phone] Office: [place] 06/09/2018, 9:53 AM

## 2018-06-09 NOTE — Progress Notes (Signed)
Progress Note  Patient Name: Mary Raymond Date of Encounter: 06/09/2018  Primary Cardiologist: Carlyle Dolly, MD   Subjective   No chest pain or shortness of breath today.  Complains of inability to see on the right side.  Inpatient Medications    Scheduled Meds: . acyclovir  400 mg Oral Daily  . amLODipine  10 mg Oral Daily  . aspirin EC  81 mg Oral Daily  . fluticasone  2 spray Each Nare Daily  . furosemide  20 mg Oral Daily  . HYDROcodone-acetaminophen  1 tablet Oral QHS  . insulin aspart  0-5 Units Subcutaneous QHS  . insulin aspart  0-9 Units Subcutaneous TID WC  . insulin aspart  15 Units Subcutaneous TID WC  . insulin glargine  25 Units Subcutaneous BID  . levothyroxine  100 mcg Oral QAC breakfast  . losartan  100 mg Oral Daily  . montelukast  10 mg Oral QHS  . pantoprazole  80 mg Oral Daily  . rosuvastatin  40 mg Oral QHS  . sodium chloride flush  3 mL Intravenous Q12H   Continuous Infusions: . sodium chloride    . heparin 1,900 Units/hr (06/09/18 4944)  . nitroGLYCERIN Stopped (06/08/18 0919)   PRN Meds: sodium chloride, acetaminophen, albuterol, ALPRAZolam, gi cocktail, ipratropium-albuterol, LORazepam, morphine injection, ondansetron (ZOFRAN) IV, sodium chloride flush   Vital Signs    Vitals:   06/09/18 0600 06/09/18 0700 06/09/18 0743 06/09/18 0800  BP: (!) 161/60 (!) 184/71  (!) 185/59  Pulse: (!) 109 (!) 43  (!) 29  Resp: 18 19  20   Temp:   97.8 F (36.6 C)   TempSrc:   Oral   SpO2: 97% 99%  100%  Weight:      Height:        Intake/Output Summary (Last 24 hours) at 06/09/2018 0908 Last data filed at 06/09/2018 0800 Gross per 24 hour  Intake 797.94 ml  Output 2975 ml  Net -2177.06 ml   Filed Weights   06/05/18 1454 06/05/18 1835 06/06/18 1513  Weight: 126.7 kg 125.6 kg 122.4 kg    Telemetry    Sinus rhythm, second-degree type I AV block, periods of bradycardia with ventricular rate in the 30s - Personally  Reviewed   Physical Exam  Alert, oriented woman, morbidly obese, in no distress GEN: No acute distress.   Neck: No JVD Cardiac:  Bradycardic and regular with grade 2/6 systolic ejection murmur at the right upper sternal border Respiratory: Clear to auscultation bilaterally. GI: Soft, nontender, non-distended  MS: No edema; No deformity. Neuro:  Nonfocal  Psych: Normal affect   Labs    Chemistry Recent Labs  Lab 06/05/18 1456 06/06/18 0157 06/07/18 0311  NA 139 140 138  K 4.0 4.3 4.8  CL 106 106 105  CO2 25 26 24   GLUCOSE 70 126* 260*  BUN 20 21 16   CREATININE 0.97 1.04* 0.99  CALCIUM 9.1 8.6* 8.7*  GFRNONAA 60* 55* 58*  GFRAA >60 >60 >60  ANIONGAP 8 8 9      Hematology Recent Labs  Lab 06/06/18 0538 06/07/18 0311 06/08/18 0215  WBC 10.7* 10.7* 10.5  RBC 3.87 3.98 3.96  HGB 9.3* 9.7* 9.6*  HCT 31.5* 32.8* 33.0*  MCV 81.4 82.4 83.3  MCH 24.0* 24.4* 24.2*  MCHC 29.5* 29.6* 29.1*  RDW 16.7* 16.5* 16.4*  PLT 239 227 206    Cardiac Enzymes Recent Labs  Lab 06/05/18 1947 06/05/18 2304 06/06/18 0157  TROPONINI 0.25* 0.34* 0.35*  Recent Labs  Lab 06/05/18 1505  TROPIPOC 0.11*     BNP Recent Labs  Lab 06/05/18 1526  BNP 197.0*     DDimer No results for input(s): DDIMER in the last 168 hours.   Radiology    Ct Head Wo Contrast  Result Date: 06/07/2018 CLINICAL DATA:  67 year old female with altered mental status and nausea. EXAM: CT HEAD WITHOUT CONTRAST TECHNIQUE: Contiguous axial images were obtained from the base of the skull through the vertex without intravenous contrast. COMPARISON:  Head CT 09/12/2017. FINDINGS: Brain: Cerebral volume is stable and within normal limits for age. No midline shift, ventriculomegaly, mass effect, evidence of mass lesion, intracranial hemorrhage or evidence of cortically based acute infarction. Gray-white matter differentiation is within normal limits throughout the brain. Vascular: Calcified atherosclerosis at  the skull base. No suspicious intracranial vascular hyperdensity. Skull: Hyperostosis, compatible with normal variant. Bone mineralization is within normal limits. No acute osseous abnormality identified. Sinuses/Orbits: Visualized paranasal sinuses and mastoids are stable and well pneumatized. Other: No acute orbit or scalp soft tissue findings. IMPRESSION: Stable and normal for age non contrast CT appearance of the brain. Electronically Signed   By: Genevie Ann M.D.   On: 06/07/2018 12:46   Mr Brain Wo Contrast  Result Date: 06/08/2018 CLINICAL DATA:  Altered mental status, nausea. Suspect stroke. History of hypertension, hyperlipidemia, diabetes. EXAM: MRI HEAD WITHOUT CONTRAST TECHNIQUE: Multiplanar, multiecho pulse sequences of the brain and surrounding structures were obtained without intravenous contrast. COMPARISON:  CT HEAD June 07, 2018. FINDINGS: INTRACRANIAL CONTENTS: Subcentimeter foci reduced diffusion LEFT occipital lobe with low ADC values. No susceptibility artifact to suggest hemorrhage. Ventricles and sulci are normal for patient's age. A fused subcentimeter supratentorial and patchy pontine white matter FLAIR T2 hyperintensities. No midline shift, mass effect or masses. No abnormal extra-axial fluid collections. VASCULAR: Normal major intracranial vascular flow voids present at skull base. Loss of LEFT mid to distal posterior cerebral artery flow void versus artifact. SKULL AND UPPER CERVICAL SPINE: No abnormal sellar expansion. No suspicious calvarial bone marrow signal. Craniocervical junction maintained. SINUSES/ORBITS: The mastoid air-cells and included paranasal sinuses are well-aerated.The included ocular globes and orbital contents are non-suspicious. OTHER: None. IMPRESSION: 1. 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 2. Occluded LEFT mid to distal PCA versus artifact. Consider CTA versus MRA head. 3. Mild chronic small vessel ischemic changes. 4. These results will be  called to the ordering clinician or representative by the Radiologist Assistant, and communication documented in the PACS or zVision Dashboard. Electronically Signed   By: Elon Alas M.D.   On: 06/08/2018 13:23    Cardiac Studies   Echocardiogram: Study Conclusions  - Left ventricle: The cavity size was normal. Wall thickness was increased in a pattern of moderate LVH. Systolic function was normal. The estimated ejection fraction was in the range of 60% to 65%. Wall motion was normal; there were no regional wall motion abnormalities. Indeterminate diastolic function. - Aortic valve: Moderately calcified annulus. Trileaflet; mildly calcified leaflets. - Mitral valve: Moderately calcified annulus. There was mild regurgitation. - Left atrium: The atrium was mildly dilated. - Right atrium: Central venous pressure (est): 15 mm Hg. - Atrial septum: No defect or patent foramen ovale was identified. - Tricuspid valve: There was trivial regurgitation. - Pulmonary arteries: Systolic pressure could not be accurately estimated. - Pericardium, extracardiac: A prominent pericardial fat pad was present.  Procedures   LEFT HEART CATH AND CORONARY ANGIOGRAPHY  Conclusion     Prox RCA to Mid  RCA lesion is 90% stenosed.  Dist RCA lesion is 80% stenosed.  Mid RCA lesion is 80% stenosed.  Acute Mrg lesion is 40% stenosed.  Mid LM lesion is 55% stenosed.  Ost Cx lesion is 90% stenosed.  Prox Cx lesion is 85% stenosed.  Ost LAD to Prox LAD lesion is 50% stenosed.  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 There is 55% focal distal left main stenosis, calcification of the proximal LAD with 50% stenosis, 90% ostial left circumflex stenosis followed by 85% calcified proximal stenosis; and a large dominant RCA with severe eccentric calcification with 90% stenosis proximally, 80% stenosis in the region of the crux and 80% stenosis prior to the  PDA takeoff with 40% marginal/PDA branch stenosis.  Preserved global LV contractility with inferior hypocontractility and ejection fraction of approximately 55%. LVEDP 17 mm Hg.   RECOMMENDATION: Surgical consultation for CABG revascularization. The patient will be started on heparin therapy 8 hours post sheath removal. She will also be started on IV nitroglycerin. I have discussed the catheterization findings with Dr. Servando Snare. She has been documented to have intermittent second-degree type I block. She will transported to [facility] for further evaluation and treatment prior to subsequent CABG revascularization. Recommend Aspirin 81mg  daily and will not start DAPT due to need for surgery.     Patient Profile     67 y.o. female with past medical history of HTN, HLD, and type II DM who presented to [facility], ED on 06/05/2018 for evaluation of chest pain. Troponin value elevated to 0.34.  Assessment & Plan    1.  Non-STEMI: Cardiac catheterization films are reviewed.  The patient has complex multivessel coronary disease primarily affecting the RCA and left circumflex.  She has been turned down for cardiac surgery because of poor functional capacity, multiple comorbid medical problems, and acute stroke.  Considerations include complex PCI with atherectomy versus palliative medical therapy.  Concerned about pursuing PCI this early after an acute stroke.  Will follow clinically and discuss with neurology.  Continue IV heparin for now because of severe multivessel CAD.  Will need dual antiplatelet therapy with aspirin and clopidogrel.  2.  Second-degree AV block type I: Telemetry is reviewed.  She has periods of marked bradycardia.  The patient is hemodynamically stable with a narrow complex QRS.  Avoid all AV nodal blockade including beta-blockers.  3.  Type 2 diabetes: Metformin on hold.  Treated with sliding scale insulin.  4.  Hypertension: Blood pressure controlled.  Avoid AV nodal blocking  agents.  5.  Hyperlipidemia: Treated with rosuvastatin 40 mg daily.   For questions or updates, please contact [facility] Please consult [email or web address] for contact info under      Signed, Sherren Mocha, MD  06/09/2018, 9:08 AM

## 2018-06-09 NOTE — Progress Notes (Signed)
PROGRESS NOTE  Mary Raymond XBD:[id number] DOB: 05/06/1951 DOA: 06/05/2018 [name], FNP  HPI/Brief Narrative  Mary Raymond is a 67 y.o. year old female with medical history significant for morbid obesity, HTN, HLD and diabetes who presented on 06/05/2018 with chest pain and elevated troponin 0 0.25 and was found to have an NSTEMI with heart cath on 9/20 showing severe three-vessel disease.    Subjective NO confusion. Reports blacked out vision in lateral quadrant of right eye. 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 Reporting blacked out vision in lateral quadrant of right eye on 9/22 that patient reports initially noticing on 9/21. Neurology following for CTA head/neck imaging. Cardio event monitor likely needed as outpatient to rule out A fib per neuro  NSTEMI, stable.  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  Not at optimal CABG candidate per CTS.  Cardiology considering complex PCI versus palliative medical therapy.  IV heparin for now will eventually need DAPT. On crestor  Second-degree AV block, type I, stable.  Heart rate range 40-60.  Monitoring on telemetry and avoiding AV nodal blocking agents, including beta-blockers  Hypertension,  improving.  On home losartan and amlodipine.  Holding PTA Toprol-XL given above bradycardia.  No need for permissive hypertension at this time per neurology.  Weaned off nitro drip.  Altered mental status, acute on chronic, resolved.  Family reports at baseline lucid with frequent intervals of confusion and odd behavior for multiple years seems consistent with dementia. She is more alert and oriented now.   Avoid sedating/BEERS criteria medications.  Possible element of delirium. continue precautions  Chronic anemia.  Hemoglobin stable at 9.  Monitor CBC.  Type 2 diabetes, A1c on 05/2018-7.3%, controlled  Home Lantus regimen decreased from 45 units twice  daily to 25 units with good blood glucose control.  Monitor CBGs, 15 units NovoLog scheduled with meals, sliding scale.  Hyperlipidemia.  On Crestor 40  Hypothyroidism, stable.  TSH 1.8.  Continue home levothyroxine 100 mcg daily   Code Status: Full code  Family Communication: will update Daughter  Disposition Plan: stroke workup, BP control, cardiology recs  Consultants:  Treatment Team:  Arnoldo Lenis, MD  Lbcardiology, Round[place], MD    Procedures:  [facility] 9/20  TTE   Antimicrobials: Anti-infectives (From admission, onward)   Start     Dose/Rate Route Frequency Ordered Stop   06/06/18 1000  acyclovir (ZOVIRAX) tablet 400 mg     400 mg Oral Daily 06/05/18 1844          Cultures:  None  Telemetry: Yes  DVT prophylaxis: IV heparin   Objective: Vitals:   06/09/18 1953 06/09/18 2000 06/09/18 2030 06/09/18 2100  BP:   (!) 170/67 (!) 185/55  Pulse:  65 (!) 118 (!) 38  Resp:  17 (!) 25 (!) 23  Temp: 98.6 F (37 C)     TempSrc: Oral     SpO2:  96% 94% 94%  Weight:      Height:        Intake/Output Summary (Last 24 hours) at 06/09/2018 2314 Last data filed at 06/09/2018 2000 Gross per 24 hour  Intake 454 ml  Output 650 ml  Net -196 ml   Filed Weights   06/05/18 1454 06/05/18 1835 06/06/18 1513  Weight: 126.7 kg 125.6 kg 122.4 kg    Exam:  Constitutional: pleasant Female, in no acute distress ENMT: Oropharynx  with moist mucous membranes, normal dentition Cardiovascular: Bradycardic, normal rhythm, SEM heard, no peripheral edema Respiratory: Normal respiratory effort on room air, clear breath sounds  Skin: No rash ulcers, or lesions. Without skin tenting  Neurologic: no new neurologic deficits, moving all extremities Psychiatric:Appropriate affect, and mood. Mental status alert, oriented to self, place, context.   Data Reviewed: CBC: Recent Labs  Lab 06/05/18 1456 06/06/18 0538 06/06/18 1201 06/07/18 0311 06/08/18 0215 06/09/18 0923    WBC 12.5* 10.7*  --  10.7* 10.5 10.5  HGB 10.1* 9.3*  --  9.7* 9.6* 10.5*  HCT 33.1* 31.5* 32.2* 32.8* 33.0* 35.3*  MCV 80.9 81.4  --  82.4 83.3 83.5  PLT 257 239  --  227 206 644   Basic Metabolic Panel: Recent Labs  Lab 06/05/18 1456 06/06/18 0157 06/07/18 0311  NA 139 140 138  K 4.0 4.3 4.8  CL 106 106 105  CO2 25 26 24   GLUCOSE 70 126* 260*  BUN 20 21 16   CREATININE 0.97 1.04* 0.99  CALCIUM 9.1 8.6* 8.7*   GFR: Estimated Creatinine Clearance: 73.4 mL/min (by C-G formula based on SCr of 0.99 mg/dL). Liver Function Tests: No results for input(s): AST, ALT, ALKPHOS, BILITOT, PROT, ALBUMIN in the last 168 hours. No results for input(s): LIPASE, AMYLASE in the last 168 hours. No results for input(s): AMMONIA in the last 168 hours. Coagulation Profile: No results for input(s): INR, PROTIME in the last 168 hours. Cardiac Enzymes: Recent Labs  Lab 06/05/18 1947 06/05/18 2304 06/06/18 0157  TROPONINI 0.25* 0.34* 0.35*   BNP (last 3 results) No results for input(s): PROBNP in the last 8760 hours. HbA1C: No results for input(s): HGBA1C in the last 72 hours. CBG: Recent Labs  Lab 06/09/18 0632 06/09/18 0859 06/09/18 1215 06/09/18 1617 06/09/18 2130  GLUCAP 125* 141* 233* 234* 160*   Lipid Profile: Recent Labs    06/09/18 0650  CHOL 170  HDL 40*  LDLCALC 97  TRIG 165*  CHOLHDL 4.3   Thyroid Function Tests: No results for input(s): TSH, T4TOTAL, FREET4, T3FREE, THYROIDAB in the last 72 hours. Anemia Panel: No results for input(s): VITAMINB12, FOLATE, FERRITIN, TIBC, IRON, RETICCTPCT in the last 72 hours. Urine analysis:    Component Value Date/Time   COLORURINE YELLOW 09/12/2017 1047   APPEARANCEUR Clear 02/25/2018 1050   LABSPEC 1.017 09/12/2017 1047   PHURINE 5.0 09/12/2017 1047   GLUCOSEU Negative 02/25/2018 1050   HGBUR SMALL (A) 09/12/2017 1047   BILIRUBINUR Negative 02/25/2018 1050   KETONESUR NEGATIVE 09/12/2017 1047   PROTEINUR 3+ (A)  02/25/2018 1050   PROTEINUR >=300 (A) 09/12/2017 1047   UROBILINOGEN negative 11/14/2015 0949   NITRITE Negative 02/25/2018 1050   NITRITE NEGATIVE 09/12/2017 1047   LEUKOCYTESUR Negative 02/25/2018 1050   Sepsis Labs: @LABRCNTIP (procalcitonin:4,lacticidven:4)  ) Recent Results (from the past 240 hour(s))  MRSA PCR Screening     Status: None   Collection Time: 06/06/18  5:47 PM  Result Value Ref Range Status   MRSA by PCR NEGATIVE NEGATIVE Final    Comment:        The GeneXpert MRSA Assay (FDA approved for NASAL specimens only), is one component of a comprehensive MRSA colonization surveillance program. It is not intended to diagnose MRSA infection nor to guide or monitor treatment for MRSA infections. Performed at [facility], [place] [address], [place], [place] [address]       Studies: No results found.  Scheduled Meds: . acyclovir  400 mg  Oral Daily  . amLODipine  10 mg Oral Daily  . aspirin EC  81 mg Oral Daily  . fluticasone  2 spray Each Nare Daily  . furosemide  20 mg Oral Daily  . HYDROcodone-acetaminophen  1 tablet Oral QHS  . insulin aspart  0-5 Units Subcutaneous QHS  . insulin aspart  0-9 Units Subcutaneous TID WC  . insulin aspart  15 Units Subcutaneous TID WC  . insulin glargine  25 Units Subcutaneous BID  . levothyroxine  100 mcg Oral QAC breakfast  . losartan  100 mg Oral Daily  . montelukast  10 mg Oral QHS  . pantoprazole  80 mg Oral Daily  . rosuvastatin  40 mg Oral QHS  . sodium chloride flush  3 mL Intravenous Q12H    Continuous Infusions: . sodium chloride    . heparin 1,800 Units/hr (06/09/18 1931)  . nitroGLYCERIN Stopped (06/08/18 0919)     LOS: 3 days     Desiree Hane, MD [facility] Pager [phone]  If 7PM-7AM, please contact night-coverage [email or web address] Password [email or web address] 06/09/2018, 11:14 PM

## 2018-06-10 LAB — GLUCOSE, CAPILLARY
GLUCOSE-CAPILLARY: 140 mg/dL — AB (ref 70–99)
Glucose-Capillary: 138 mg/dL — ABNORMAL HIGH (ref 70–99)
Glucose-Capillary: 151 mg/dL — ABNORMAL HIGH (ref 70–99)
Glucose-Capillary: 189 mg/dL — ABNORMAL HIGH (ref 70–99)
Glucose-Capillary: 227 mg/dL — ABNORMAL HIGH (ref 70–99)

## 2018-06-10 LAB — CBC
HCT: 32 % — ABNORMAL LOW (ref 36.0–46.0)
Hemoglobin: 9.4 g/dL — ABNORMAL LOW (ref 12.0–15.0)
MCH: 24.5 pg — AB (ref 26.0–34.0)
MCHC: 29.4 g/dL — ABNORMAL LOW (ref 30.0–36.0)
MCV: 83.3 fL (ref 78.0–100.0)
PLATELETS: 191 10*3/uL (ref 150–400)
RBC: 3.84 MIL/uL — AB (ref 3.87–5.11)
RDW: 16.6 % — ABNORMAL HIGH (ref 11.5–15.5)
WBC: 10.5 10*3/uL (ref 4.0–10.5)

## 2018-06-10 LAB — HEPARIN LEVEL (UNFRACTIONATED): Heparin Unfractionated: 0.38 IU/mL (ref 0.30–0.70)

## 2018-06-10 MED ORDER — CLOPIDOGREL BISULFATE 75 MG PO TABS
75.0000 mg | ORAL_TABLET | Freq: Every day | ORAL | Status: DC
  Administered 2018-06-11 – 2018-06-19 (×9): 75 mg via ORAL
  Filled 2018-06-10 (×9): qty 1

## 2018-06-10 MED ORDER — IOPAMIDOL (ISOVUE-370) INJECTION 76%
50.0000 mL | Freq: Once | INTRAVENOUS | Status: AC | PRN
  Administered 2018-06-10: 50 mL via INTRAVENOUS

## 2018-06-10 MED ORDER — CLOPIDOGREL BISULFATE 300 MG PO TABS
600.0000 mg | ORAL_TABLET | Freq: Once | ORAL | Status: AC
  Administered 2018-06-10: 600 mg via ORAL
  Filled 2018-06-10: qty 2

## 2018-06-10 NOTE — Progress Notes (Signed)
PROGRESS NOTE  ARLEAN THIES ATF:[id number] DOB: 11-16-1950 DOA: 06/05/2018 [name], FNP  HPI/Brief Narrative  DANIEL JOHNDROW is a 67 y.o. year old female with medical history significant for morbid obesity, HTN, HLD and diabetes who presented on 06/05/2018 with chest pain and elevated troponin 0 0.25 and was found to have an NSTEMI with heart cath on 9/20 showing severe three-vessel disease.  Patient not a candidate for CABG due to poor functional status.  Course complicated by small left MCA PCA infarct found on MRI on 9/22 after patient complained of decreased vision on the right eye.  Cardiology currently considering complex PCI versus medical therapy with DAPT  Subjective No chest pain.  States feeling some congestion like to use her Combivent.  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 Reported blacked out vision in lateral quadrant of right eye on 9/22 that patient reports initially noticing on 9/21 prompting MRI brain which found stroke. Stable with no other neurologic deficits, neurology suspects likely related to recent LHC, but will still recommend outpatient cardiac event monitor to rule out atrial fibrillation. Neurology following for CTA head/neck imaging. Cardio event monitor likely needed as outpatient to rule out A fib per neuro  NSTEMI, stable.  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  Not at optimal CABG candidate per CTS.  Cardiology considering complex PCI versus palliative medical therapy.  IV heparin for now will eventually need DAPT. On crestor  Second-degree AV block, type I, stable.  Heart rate range 40-60.  Monitoring on telemetry and avoiding AV nodal blocking agents, including beta-blockers  Hypertension,  Improving, still not at goal with SBP to 170.  On home losartan and amlodipine.  Holding PTA Toprol-XL given above bradycardia.  No need for permissive hypertension at this time per  neurology.  Weaned off nitro drip on 9/22.  Altered mental status, acute on chronic, resolved.  Family reports at baseline lucid with frequent intervals of confusion and odd behavior for multiple years seems consistent with dementia. She is more alert and oriented now.   Avoid sedating/BEERS criteria medications.  Possible element of delirium. continue precautions  Chronic anemia.  Hemoglobin stable at 9.  Monitor CBC.  Type 2 diabetes, A1c on 05/2018-7.3%, controlled  Home Lantus regimen decreased from 45 units twice daily to 25 units with good blood glucose control.  Monitor CBGs, 15 units NovoLog scheduled with meals, sliding scale.  Hyperlipidemia.  On Crestor 40  Hypothyroidism, stable.  TSH 1.8.  Continue home levothyroxine 100 mcg daily   Code Status: Full code  Family Communication: no family at bedside, daughter updated by cardiology  Disposition Plan:  BP control, cardiology recs  Consultants:  Treatment Team:  Arnoldo Lenis, MD  Lbcardiology, Rounding, MD    Procedures:  [facility] 9/20  TTE   Antimicrobials: Anti-infectives (From admission, onward)   Start     Dose/Rate Route Frequency Ordered Stop   06/06/18 1000  acyclovir (ZOVIRAX) tablet 400 mg     400 mg Oral Daily 06/05/18 1844          Cultures:  None  Telemetry: Yes  DVT prophylaxis: IV heparin   Objective: Vitals:   06/10/18 1200 06/10/18 1225 06/10/18 1300 06/10/18 1400  BP: (!) 190/57  (!) 190/76 (!) 157/51  Pulse: 70  66 60  Resp: 20  18 19   Temp:  98 F (36.7 C)    TempSrc:  Oral  SpO2: 95%  95% 97%  Weight:      Height:        Intake/Output Summary (Last 24 hours) at 06/10/2018 1608 Last data filed at 06/10/2018 1400 Gross per 24 hour  Intake 894.64 ml  Output 1300 ml  Net -405.36 ml   Filed Weights   06/05/18 1454 06/05/18 1835 06/06/18 1513  Weight: 126.7 kg 125.6 kg 122.4 kg    Exam:  Constitutional: pleasant Female, in no acute distress Cardiovascular:  Bradycardic, normal rhythm, SEM heard loudest in LICS, no peripheral edema Respiratory: Normal respiratory effort on room air, clear breath sounds  Skin: No rash ulcers, or lesions. Without skin tenting  Neurologic: no new neurologic deficits, moving all extremities Psychiatric:Appropriate affect, and mood. Mental status alert, oriented to self, place, context.   Data Reviewed: CBC: Recent Labs  Lab 06/06/18 0538 06/06/18 1201 06/07/18 0311 06/08/18 0215 06/09/18 0923 06/10/18 0634  WBC 10.7*  --  10.7* 10.5 10.5 10.5  HGB 9.3*  --  9.7* 9.6* 10.5* 9.4*  HCT 31.5* 32.2* 32.8* 33.0* 35.3* 32.0*  MCV 81.4  --  82.4 83.3 83.5 83.3  PLT 239  --  227 206 215 027   Basic Metabolic Panel: Recent Labs  Lab 06/05/18 1456 06/06/18 0157 06/07/18 0311  NA 139 140 138  K 4.0 4.3 4.8  CL 106 106 105  CO2 25 26 24   GLUCOSE 70 126* 260*  BUN 20 21 16   CREATININE 0.97 1.04* 0.99  CALCIUM 9.1 8.6* 8.7*   GFR: Estimated Creatinine Clearance: 73.4 mL/min (by C-G formula based on SCr of 0.99 mg/dL). Liver Function Tests: No results for input(s): AST, ALT, ALKPHOS, BILITOT, PROT, ALBUMIN in the last 168 hours. No results for input(s): LIPASE, AMYLASE in the last 168 hours. No results for input(s): AMMONIA in the last 168 hours. Coagulation Profile: No results for input(s): INR, PROTIME in the last 168 hours. Cardiac Enzymes: Recent Labs  Lab 06/05/18 1947 06/05/18 2304 06/06/18 0157  TROPONINI 0.25* 0.34* 0.35*   BNP (last 3 results) No results for input(s): PROBNP in the last 8760 hours. HbA1C: No results for input(s): HGBA1C in the last 72 hours. CBG: Recent Labs  Lab 06/09/18 1617 06/09/18 2130 06/10/18 0635 06/10/18 0758 06/10/18 1224  GLUCAP 234* 160* 140* 138* 151*   Lipid Profile: Recent Labs    06/09/18 0650  CHOL 170  HDL 40*  LDLCALC 97  TRIG 165*  CHOLHDL 4.3   Thyroid Function Tests: No results for input(s): TSH, T4TOTAL, FREET4, T3FREE, THYROIDAB in  the last 72 hours. Anemia Panel: No results for input(s): VITAMINB12, FOLATE, FERRITIN, TIBC, IRON, RETICCTPCT in the last 72 hours. Urine analysis:    Component Value Date/Time   COLORURINE YELLOW 09/12/2017 1047   APPEARANCEUR Clear 02/25/2018 1050   LABSPEC 1.017 09/12/2017 1047   PHURINE 5.0 09/12/2017 1047   GLUCOSEU Negative 02/25/2018 1050   HGBUR SMALL (A) 09/12/2017 1047   BILIRUBINUR Negative 02/25/2018 1050   KETONESUR NEGATIVE 09/12/2017 1047   PROTEINUR 3+ (A) 02/25/2018 1050   PROTEINUR >=300 (A) 09/12/2017 1047   UROBILINOGEN negative 11/14/2015 0949   NITRITE Negative 02/25/2018 1050   NITRITE NEGATIVE 09/12/2017 1047   LEUKOCYTESUR Negative 02/25/2018 1050   Sepsis Labs: @LABRCNTIP (procalcitonin:4,lacticidven:4)  ) Recent Results (from the past 240 hour(s))  MRSA PCR Screening     Status: None   Collection Time: 06/06/18  5:47 PM  Result Value Ref Range Status   MRSA by PCR NEGATIVE NEGATIVE Final  Comment:        The GeneXpert MRSA Assay (FDA approved for NASAL specimens only), is one component of a comprehensive MRSA colonization surveillance program. It is not intended to diagnose MRSA infection nor to guide or monitor treatment for MRSA infections. Performed at [facility], [place] [address], [place], [place] [address]       Studies: Ct Angio Head W Or Wo Contrast  Result Date: 06/10/2018 CLINICAL DATA:  67 y/o F; status post NSTEMI and catheterization 06/07/2018. 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 Patient reports blacking out of vision in the lateral quadrant of the right eye. EXAM: CT ANGIOGRAPHY HEAD AND NECK TECHNIQUE: Multidetector CT imaging of the head and neck was performed using the standard protocol during bolus administration of intravenous contrast. Multiplanar CT image reconstructions and MIPs were obtained to evaluate the vascular anatomy. Carotid stenosis measurements (when applicable) are obtained  utilizing NASCET criteria, using the distal internal carotid diameter as the denominator. CONTRAST:  50 cc Isovue 370 COMPARISON:  06/08/2018 MRI of the head. FINDINGS: CT HEAD FINDINGS Brain: Faint lucency in the left occipital lobe corresponding to small infarct on prior MRI. No new stroke, hemorrhage, focal mass effect, extra-axial collection, hydrocephalus, or herniation. Few nonspecific hypodensities in white matter are compatible with mild chronic microvascular ischemic changes and there is mild volume loss of the brain. Vascular: As below. Skull: Normal. Negative for fracture or focal lesion. Sinuses: Imaged portions are clear. Orbits: No acute finding. Review of the MIP images confirms the above findings CTA NECK FINDINGS Aortic arch: Standard branching. Imaged portion shows no evidence of aneurysm or dissection. No significant stenosis of the major arch vessel origins. Moderate aortic calcific atherosclerosis. Right carotid system: No evidence of dissection, stenosis (50% or greater) or occlusion. Calcified plaque of the carotid bifurcation with mild less than 50% proximal ICA stenosis. Left carotid system: No evidence of dissection, stenosis (50% or greater) or occlusion. Mixed plaque of the carotid bifurcation with mild less than 50% proximal ICA stenosis. Vertebral arteries: Codominant. No evidence of dissection, stenosis (50% or greater) or occlusion. Skeleton: Mild cervical spondylosis. No high-grade bony canal stenosis. Other neck: Right upper paratracheal lymphadenopathy measuring 18 x 15 mm (series 8, image 34). Upper chest: Negative. Review of the MIP images confirms the above findings CTA HEAD FINDINGS Anterior circulation: No significant stenosis, proximal occlusion, aneurysm, or vascular malformation. Calcified plaque of the carotid siphons with mild less less than 50% paraclinoid stenosis, greater on the left. Posterior circulation: No high-grade stenosis, proximal occlusion, aneurysm, or  vascular malformation. Right vertebral artery calcified plaque with mild to moderate approximately 50% stenosis. Left vertebral artery calcified plaque with mild less than 50% stenosis. Venous sinuses: As permitted by contrast timing, patent. Anatomic variants: None significant. Delayed phase: No abnormal intracranial enhancement. Review of the MIP images confirms the above findings IMPRESSION: 1. Patent carotid and vertebral arteries. No dissection, aneurysm, or hemodynamically significant stenosis utilizing NASCET criteria. 2. Patent anterior and posterior intracranial circulation. No large vessel occlusion, aneurysm, or high-grade stenosis. 3. Atherosclerosis of the aorta, carotid bifurcations, carotid siphons, and bilateral intracranial vertebral arteries. 4. Small left occipital lobe acute infarction better characterized on prior MRI. No new acute intracranial abnormality on noncontrast CT of head. No abnormal enhancement of the brain. 5. Right upper paratracheal lymphadenopathy of uncertain significance. 6. Mild cervical spine spondylosis. Electronically Signed   By: Kristine Garbe M.D.   On: 06/10/2018 01:19   Ct Angio  Neck W Or Wo Contrast  Result Date: 06/10/2018 CLINICAL DATA:  66 y/o F; status post NSTEMI and catheterization 06/07/2018. 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 Patient reports blacking out of vision in the lateral quadrant of the right eye. EXAM: CT ANGIOGRAPHY HEAD AND NECK TECHNIQUE: Multidetector CT imaging of the head and neck was performed using the standard protocol during bolus administration of intravenous contrast. Multiplanar CT image reconstructions and MIPs were obtained to evaluate the vascular anatomy. Carotid stenosis measurements (when applicable) are obtained utilizing NASCET criteria, using the distal internal carotid diameter as the denominator. CONTRAST:  50 cc Isovue 370 COMPARISON:  06/08/2018 MRI of the head. FINDINGS: CT HEAD  FINDINGS Brain: Faint lucency in the left occipital lobe corresponding to small infarct on prior MRI. No new stroke, hemorrhage, focal mass effect, extra-axial collection, hydrocephalus, or herniation. Few nonspecific hypodensities in white matter are compatible with mild chronic microvascular ischemic changes and there is mild volume loss of the brain. Vascular: As below. Skull: Normal. Negative for fracture or focal lesion. Sinuses: Imaged portions are clear. Orbits: No acute finding. Review of the MIP images confirms the above findings CTA NECK FINDINGS Aortic arch: Standard branching. Imaged portion shows no evidence of aneurysm or dissection. No significant stenosis of the major arch vessel origins. Moderate aortic calcific atherosclerosis. Right carotid system: No evidence of dissection, stenosis (50% or greater) or occlusion. Calcified plaque of the carotid bifurcation with mild less than 50% proximal ICA stenosis. Left carotid system: No evidence of dissection, stenosis (50% or greater) or occlusion. Mixed plaque of the carotid bifurcation with mild less than 50% proximal ICA stenosis. Vertebral arteries: Codominant. No evidence of dissection, stenosis (50% or greater) or occlusion. Skeleton: Mild cervical spondylosis. No high-grade bony canal stenosis. Other neck: Right upper paratracheal lymphadenopathy measuring 18 x 15 mm (series 8, image 34). Upper chest: Negative. Review of the MIP images confirms the above findings CTA HEAD FINDINGS Anterior circulation: No significant stenosis, proximal occlusion, aneurysm, or vascular malformation. Calcified plaque of the carotid siphons with mild less less than 50% paraclinoid stenosis, greater on the left. Posterior circulation: No high-grade stenosis, proximal occlusion, aneurysm, or vascular malformation. Right vertebral artery calcified plaque with mild to moderate approximately 50% stenosis. Left vertebral artery calcified plaque with mild less than 50%  stenosis. Venous sinuses: As permitted by contrast timing, patent. Anatomic variants: None significant. Delayed phase: No abnormal intracranial enhancement. Review of the MIP images confirms the above findings IMPRESSION: 1. Patent carotid and vertebral arteries. No dissection, aneurysm, or hemodynamically significant stenosis utilizing NASCET criteria. 2. Patent anterior and posterior intracranial circulation. No large vessel occlusion, aneurysm, or high-grade stenosis. 3. Atherosclerosis of the aorta, carotid bifurcations, carotid siphons, and bilateral intracranial vertebral arteries. 4. Small left occipital lobe acute infarction better characterized on prior MRI. No new acute intracranial abnormality on noncontrast CT of head. No abnormal enhancement of the brain. 5. Right upper paratracheal lymphadenopathy of uncertain significance. 6. Mild cervical spine spondylosis. Electronically Signed   By: Kristine Garbe M.D.   On: 06/10/2018 01:19    Scheduled Meds: . acyclovir  400 mg Oral Daily  . amLODipine  10 mg Oral Daily  . aspirin EC  81 mg Oral Daily  . [START ON 06/11/2018] clopidogrel  75 mg Oral Daily  . fluticasone  2 spray Each Nare Daily  . furosemide  20 mg Oral Daily  . HYDROcodone-acetaminophen  1 tablet Oral QHS  . insulin aspart  0-5  Units Subcutaneous QHS  . insulin aspart  0-9 Units Subcutaneous TID WC  . insulin aspart  15 Units Subcutaneous TID WC  . insulin glargine  25 Units Subcutaneous BID  . levothyroxine  100 mcg Oral QAC breakfast  . losartan  100 mg Oral Daily  . montelukast  10 mg Oral QHS  . pantoprazole  80 mg Oral Daily  . rosuvastatin  40 mg Oral QHS  . sodium chloride flush  3 mL Intravenous Q12H    Continuous Infusions: . sodium chloride    . heparin 1,800 Units/hr (06/10/18 1400)  . nitroGLYCERIN Stopped (06/08/18 0919)     LOS: 4 days     Desiree Hane, MD [facility] Pager [phone]  If 7PM-7AM, please contact  night-coverage [email or web address] Password [facility] 06/10/2018, 4:08 PM

## 2018-06-10 NOTE — Plan of Care (Signed)

## 2018-06-10 NOTE — Progress Notes (Signed)
Progress Note  Patient Name: Mary Raymond Date of Encounter: 06/10/2018  Primary Cardiologist: Carlyle Dolly, MD   Subjective   No shortness of breath.  Had an episode of mild chest discomfort.  Feels like her right sided visual field loss has improved a little bit but still notices significant deficit there.  Inpatient Medications    Scheduled Meds: . acyclovir  400 mg Oral Daily  . amLODipine  10 mg Oral Daily  . aspirin EC  81 mg Oral Daily  . fluticasone  2 spray Each Nare Daily  . furosemide  20 mg Oral Daily  . HYDROcodone-acetaminophen  1 tablet Oral QHS  . insulin aspart  0-5 Units Subcutaneous QHS  . insulin aspart  0-9 Units Subcutaneous TID WC  . insulin aspart  15 Units Subcutaneous TID WC  . insulin glargine  25 Units Subcutaneous BID  . iopamidol      . levothyroxine  100 mcg Oral QAC breakfast  . losartan  100 mg Oral Daily  . montelukast  10 mg Oral QHS  . pantoprazole  80 mg Oral Daily  . rosuvastatin  40 mg Oral QHS  . sodium chloride flush  3 mL Intravenous Q12H   Continuous Infusions: . sodium chloride    . heparin 1,800 Units/hr (06/10/18 0800)  . nitroGLYCERIN Stopped (06/08/18 0919)   PRN Meds: sodium chloride, acetaminophen, albuterol, ALPRAZolam, gi cocktail, ipratropium-albuterol, LORazepam, morphine injection, ondansetron (ZOFRAN) IV, sodium chloride flush   Vital Signs    Vitals:   06/10/18 0630 06/10/18 0700 06/10/18 0750 06/10/18 0800  BP: (!) 146/57 (!) 165/58  (!) 151/67  Pulse: (!) 38 (!) 49  64  Resp: 18 20  (!) 21  Temp:   98.3 F (36.8 C)   TempSrc:   Oral   SpO2: 96% 97%  98%  Weight:      Height:        Intake/Output Summary (Last 24 hours) at 06/10/2018 0845 Last data filed at 06/10/2018 0800 Gross per 24 hour  Intake 814 ml  Output 650 ml  Net 164 ml   Filed Weights   06/05/18 1454 06/05/18 1835 06/06/18 1513  Weight: 126.7 kg 125.6 kg 122.4 kg    Telemetry    Sinus rhythm with periods of Mobitz 1  type II AV block and marked bradycardia - Personally Reviewed   Physical Exam  Morbidly obese woman in NAD GEN: No acute distress.   Neck: No JVD Cardiac: RRR, 2/6 systolic murmur at the RUSB Respiratory: Clear to auscultation bilaterally. GI: Soft, nontender, non-distended  MS: No edema; No deformity. Neuro:  Nonfocal  Psych: Normal affect   Labs    Chemistry Recent Labs  Lab 06/05/18 1456 06/06/18 0157 06/07/18 0311  NA 139 140 138  K 4.0 4.3 4.8  CL 106 106 105  CO2 25 26 24   GLUCOSE 70 126* 260*  BUN 20 21 16   CREATININE 0.97 1.04* 0.99  CALCIUM 9.1 8.6* 8.7*  GFRNONAA 60* 55* 58*  GFRAA >60 >60 >60  ANIONGAP 8 8 9      Hematology Recent Labs  Lab 06/08/18 0215 06/09/18 0923 06/10/18 0634  WBC 10.5 10.5 10.5  RBC 3.96 4.23 3.84*  HGB 9.6* 10.5* 9.4*  HCT 33.0* 35.3* 32.0*  MCV 83.3 83.5 83.3  MCH 24.2* 24.8* 24.5*  MCHC 29.1* 29.7* 29.4*  RDW 16.4* 16.6* 16.6*  PLT 206 215 191    Cardiac Enzymes Recent Labs  Lab 06/05/18 1947 06/05/18 2304 06/06/18  0157  TROPONINI 0.25* 0.34* 0.35*    Recent Labs  Lab 06/05/18 1505  TROPIPOC 0.11*     BNP Recent Labs  Lab 06/05/18 1526  BNP 197.0*     DDimer No results for input(s): DDIMER in the last 168 hours.   Radiology    Ct Angio Head W Or Wo Contrast  Result Date: 06/10/2018 CLINICAL DATA:  67 y/o F; status post NSTEMI and catheterization 06/07/2018. 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 Patient reports blacking out of vision in the lateral quadrant of the right eye. EXAM: CT ANGIOGRAPHY HEAD AND NECK TECHNIQUE: Multidetector CT imaging of the head and neck was performed using the standard protocol during bolus administration of intravenous contrast. Multiplanar CT image reconstructions and MIPs were obtained to evaluate the vascular anatomy. Carotid stenosis measurements (when applicable) are obtained utilizing NASCET criteria, using the distal internal carotid diameter  as the denominator. CONTRAST:  50 cc Isovue 370 COMPARISON:  06/08/2018 MRI of the head. FINDINGS: CT HEAD FINDINGS Brain: Faint lucency in the left occipital lobe corresponding to small infarct on prior MRI. No new stroke, hemorrhage, focal mass effect, extra-axial collection, hydrocephalus, or herniation. Few nonspecific hypodensities in white matter are compatible with mild chronic microvascular ischemic changes and there is mild volume loss of the brain. Vascular: As below. Skull: Normal. Negative for fracture or focal lesion. Sinuses: Imaged portions are clear. Orbits: No acute finding. Review of the MIP images confirms the above findings CTA NECK FINDINGS Aortic arch: Standard branching. Imaged portion shows no evidence of aneurysm or dissection. No significant stenosis of the major arch vessel origins. Moderate aortic calcific atherosclerosis. Right carotid system: No evidence of dissection, stenosis (50% or greater) or occlusion. Calcified plaque of the carotid bifurcation with mild less than 50% proximal ICA stenosis. Left carotid system: No evidence of dissection, stenosis (50% or greater) or occlusion. Mixed plaque of the carotid bifurcation with mild less than 50% proximal ICA stenosis. Vertebral arteries: Codominant. No evidence of dissection, stenosis (50% or greater) or occlusion. Skeleton: Mild cervical spondylosis. No high-grade bony canal stenosis. Other neck: Right upper paratracheal lymphadenopathy measuring 18 x 15 mm (series 8, image 34). Upper chest: Negative. Review of the MIP images confirms the above findings CTA HEAD FINDINGS Anterior circulation: No significant stenosis, proximal occlusion, aneurysm, or vascular malformation. Calcified plaque of the carotid siphons with mild less less than 50% paraclinoid stenosis, greater on the left. Posterior circulation: No high-grade stenosis, proximal occlusion, aneurysm, or vascular malformation. Right vertebral artery calcified plaque with mild to  moderate approximately 50% stenosis. Left vertebral artery calcified plaque with mild less than 50% stenosis. Venous sinuses: As permitted by contrast timing, patent. Anatomic variants: None significant. Delayed phase: No abnormal intracranial enhancement. Review of the MIP images confirms the above findings IMPRESSION: 1. Patent carotid and vertebral arteries. No dissection, aneurysm, or hemodynamically significant stenosis utilizing NASCET criteria. 2. Patent anterior and posterior intracranial circulation. No large vessel occlusion, aneurysm, or high-grade stenosis. 3. Atherosclerosis of the aorta, carotid bifurcations, carotid siphons, and bilateral intracranial vertebral arteries. 4. Small left occipital lobe acute infarction better characterized on prior MRI. No new acute intracranial abnormality on noncontrast CT of head. No abnormal enhancement of the brain. 5. Right upper paratracheal lymphadenopathy of uncertain significance. 6. Mild cervical spine spondylosis. Electronically Signed   By: Kristine Garbe M.D.   On: 06/10/2018 01:19   Ct Angio Neck W Or Wo Contrast  Result Date: 06/10/2018 CLINICAL DATA:  67 y/o F; status post NSTEMI and catheterization 06/07/2018. 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 Patient reports blacking out of vision in the lateral quadrant of the right eye. EXAM: CT ANGIOGRAPHY HEAD AND NECK TECHNIQUE: Multidetector CT imaging of the head and neck was performed using the standard protocol during bolus administration of intravenous contrast. Multiplanar CT image reconstructions and MIPs were obtained to evaluate the vascular anatomy. Carotid stenosis measurements (when applicable) are obtained utilizing NASCET criteria, using the distal internal carotid diameter as the denominator. CONTRAST:  50 cc Isovue 370 COMPARISON:  06/08/2018 MRI of the head. FINDINGS: CT HEAD FINDINGS Brain: Faint lucency in the left occipital lobe corresponding to small  infarct on prior MRI. No new stroke, hemorrhage, focal mass effect, extra-axial collection, hydrocephalus, or herniation. Few nonspecific hypodensities in white matter are compatible with mild chronic microvascular ischemic changes and there is mild volume loss of the brain. Vascular: As below. Skull: Normal. Negative for fracture or focal lesion. Sinuses: Imaged portions are clear. Orbits: No acute finding. Review of the MIP images confirms the above findings CTA NECK FINDINGS Aortic arch: Standard branching. Imaged portion shows no evidence of aneurysm or dissection. No significant stenosis of the major arch vessel origins. Moderate aortic calcific atherosclerosis. Right carotid system: No evidence of dissection, stenosis (50% or greater) or occlusion. Calcified plaque of the carotid bifurcation with mild less than 50% proximal ICA stenosis. Left carotid system: No evidence of dissection, stenosis (50% or greater) or occlusion. Mixed plaque of the carotid bifurcation with mild less than 50% proximal ICA stenosis. Vertebral arteries: Codominant. No evidence of dissection, stenosis (50% or greater) or occlusion. Skeleton: Mild cervical spondylosis. No high-grade bony canal stenosis. Other neck: Right upper paratracheal lymphadenopathy measuring 18 x 15 mm (series 8, image 34). Upper chest: Negative. Review of the MIP images confirms the above findings CTA HEAD FINDINGS Anterior circulation: No significant stenosis, proximal occlusion, aneurysm, or vascular malformation. Calcified plaque of the carotid siphons with mild less less than 50% paraclinoid stenosis, greater on the left. Posterior circulation: No high-grade stenosis, proximal occlusion, aneurysm, or vascular malformation. Right vertebral artery calcified plaque with mild to moderate approximately 50% stenosis. Left vertebral artery calcified plaque with mild less than 50% stenosis. Venous sinuses: As permitted by contrast timing, patent. Anatomic variants:  None significant. Delayed phase: No abnormal intracranial enhancement. Review of the MIP images confirms the above findings IMPRESSION: 1. Patent carotid and vertebral arteries. No dissection, aneurysm, or hemodynamically significant stenosis utilizing NASCET criteria. 2. Patent anterior and posterior intracranial circulation. No large vessel occlusion, aneurysm, or high-grade stenosis. 3. Atherosclerosis of the aorta, carotid bifurcations, carotid siphons, and bilateral intracranial vertebral arteries. 4. Small left occipital lobe acute infarction better characterized on prior MRI. No new acute intracranial abnormality on noncontrast CT of head. No abnormal enhancement of the brain. 5. Right upper paratracheal lymphadenopathy of uncertain significance. 6. Mild cervical spine spondylosis. Electronically Signed   By: Kristine Garbe M.D.   On: 06/10/2018 01:19   Mr Brain Wo Contrast  Result Date: 06/08/2018 CLINICAL DATA:  Altered mental status, nausea. Suspect stroke. History of hypertension, hyperlipidemia, diabetes. EXAM: MRI HEAD WITHOUT CONTRAST TECHNIQUE: Multiplanar, multiecho pulse sequences of the brain and surrounding structures were obtained without intravenous contrast. COMPARISON:  CT HEAD June 07, 2018. FINDINGS: INTRACRANIAL CONTENTS: Subcentimeter foci reduced diffusion LEFT occipital lobe with low ADC values. No susceptibility artifact to suggest hemorrhage. Ventricles and sulci are normal for patient's age. A fused subcentimeter  supratentorial and patchy pontine white matter FLAIR T2 hyperintensities. No midline shift, mass effect or masses. No abnormal extra-axial fluid collections. VASCULAR: Normal major intracranial vascular flow voids present at skull base. Loss of LEFT mid to distal posterior cerebral artery flow void versus artifact. SKULL AND UPPER CERVICAL SPINE: No abnormal sellar expansion. No suspicious calvarial bone marrow signal. Craniocervical junction maintained.  SINUSES/ORBITS: The mastoid air-cells and included paranasal sinuses are well-aerated.The included ocular globes and orbital contents are non-suspicious. OTHER: None. IMPRESSION: 1. 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 2. Occluded LEFT mid to distal PCA versus artifact. Consider CTA versus MRA head. 3. Mild chronic small vessel ischemic changes. 4. These results will be called to the ordering clinician or representative by the Radiologist Assistant, and communication documented in the PACS or zVision Dashboard. Electronically Signed   By: Elon Alas M.D.   On: 06/08/2018 13:23    Patient Profile     67 y.o. female with past medical history of HTN, HLD, and type II DM who presented to [facility], ED on 06/05/2018 for evaluation of chest pain. Troponin value elevated to 0.34.  Assessment & Plan    1.  Non-STEMI: Treatment options have been reviewed.  Cardiac catheterization images have been reviewed.  The patient has been appropriately turned down for cardiac surgery because of multiple comorbid medical conditions, very poor functional capacity, and acute stroke.  PCI might offer a reasonable treatment option for this patient, albeit at somewhat increased risk because of her periprocedural stroke and complex coronary anatomy.  I have reviewed risks, indications, and alternatives with the patient.  She requested that I speak with her daughter as the patient has significant memory problems.  I spoke at length with her daughter who would like [place] to treat her with PCI and do anything we can to help her mother.  She understands the increased risks that are outlined above.  Will review with interventional colleagues and let the patient with Plavix 600 mg today followed by 75 mg daily tomorrow.  Tentatively plan to proceed with PCI tomorrow.  May be best to go from a femoral approach even with her morbid obesity since she likely will require temporary transvenous pacing and atherectomy.  2.   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  Hold beta-blocker.  Continue to follow.  Heart rhythm demonstrates Mobitz 1 block.  Will preferentially use clopidogrel rather than ticagrelor in the setting of significant AV block.  3.  Type 2 diabetes: Treated with sliding scale insulin, managed by the hospitalist team.  4.  Hyperlipidemia: Treated with a high intensity statin drug using rosuvastatin 40 mg daily  Disposition: Tentatively plan PCI tomorrow.  Patient appears stable to move out of the ICU if there are no other ongoing medical issues.     For questions or updates, please contact [place] Please consult [email or web address] for contact info under        Signed, Sherren Mocha, MD  06/10/2018, 8:45 AM

## 2018-06-10 NOTE — Progress Notes (Addendum)
STROKE TEAM PROGRESS NOTE   HISTORY OF PRESENT ILLNESS (per record) Mary Raymond was admitted on 9/19 for chest pain and had a cardiac cath on 9/20. Since that time she thought the TV was not in good view, but did not frankly notice right periferal vision loss till a provider was standing directly on her right and she could not see him. She has some baseline memory loss issues and mild confusion at times that family is concerned that may be some early dementia. 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   SUBJECTIVE (INTERVAL HISTORY) No family in room today. Pt seen with CVICU RN. Pt feels like vision has improved some. I told her NO driving till cleared by out pt f/u neuro/optho  ROS: ROS was performed and is negative except as noted in HPI  OBJECTIVE Vitals:   06/10/18 0700 06/10/18 0750 06/10/18 0800 06/10/18 0900  BP: (!) 165/58  (!) 151/67 135/64  Pulse: (!) 49  64 (!) 51  Resp: 20  (!) 21 18  Temp:  98.3 F (36.8 C)    TempSrc:  Oral    SpO2: 97%  98% 97%  Weight:      Height:        CBC:  Recent Labs  Lab 06/09/18 0923 06/10/18 0634  WBC 10.5 10.5  HGB 10.5* 9.4*  HCT 35.3* 32.0*  MCV 83.5 83.3  PLT 215 357    Basic Metabolic Panel:  Recent Labs  Lab 06/06/18 0157 06/07/18 0311  NA 140 138  K 4.3 4.8  CL 106 105  CO2 26 24  GLUCOSE 126* 260*  BUN 21 16  CREATININE 1.04* 0.99  CALCIUM 8.6* 8.7*    Lipid Panel:     Component Value Date/Time   CHOL 170 06/09/2018 0650   CHOL 201 (H) 02/25/2018 1033   CHOL 248 (H) 02/23/2013 1159   TRIG 165 (H) 06/09/2018 0650   TRIG 274 (H) 09/16/2013 0846   TRIG 359 (H) 02/23/2013 1159   HDL 40 (L) 06/09/2018 0650   HDL 42 02/25/2018 1033   HDL 45 09/16/2013 0846   HDL 45 02/23/2013 1159   CHOLHDL 4.3 06/09/2018 0650   VLDL 33 06/09/2018 0650   LDLCALC 97 06/09/2018 0650   LDLCALC 118 (H) 02/25/2018 1033   LDLCALC 145 (H) 09/16/2013 0846   LDLCALC 131 (H) 02/23/2013 1159   HgbA1c:  Lab Results  Component Value Date    HGBA1C 7.3 (H) 06/05/2018   Urine Drug Screen: No results found for: LABOPIA, COCAINSCRNUR, LABBENZ, AMPHETMU, THCU, LABBARB  Alcohol Level No results found for: ETH  IMAGING   Ct Angio Head W Or Wo Contrast  Result Date: 06/10/2018 CLINICAL DATA:  67 y/o F; status post NSTEMI and catheterization 06/07/2018. 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 Patient reports blacking out of vision in the lateral quadrant of the right eye. EXAM: CT ANGIOGRAPHY HEAD AND NECK TECHNIQUE: Multidetector CT imaging of the head and neck was performed using the standard protocol during bolus administration of intravenous contrast. Multiplanar CT image reconstructions and MIPs were obtained to evaluate the vascular anatomy. Carotid stenosis measurements (when applicable) are obtained utilizing NASCET criteria, using the distal internal carotid diameter as the denominator. CONTRAST:  50 cc Isovue 370 COMPARISON:  06/08/2018 MRI of the head. FINDINGS: CT HEAD FINDINGS Brain: Faint lucency in the left occipital lobe corresponding to small infarct on prior MRI. No new stroke, hemorrhage, focal mass effect, extra-axial collection, hydrocephalus, or  herniation. Few nonspecific hypodensities in white matter are compatible with mild chronic microvascular ischemic changes and there is mild volume loss of the brain. Vascular: As below. Skull: Normal. Negative for fracture or focal lesion. Sinuses: Imaged portions are clear. Orbits: No acute finding. Review of the MIP images confirms the above findings CTA NECK FINDINGS Aortic arch: Standard branching. Imaged portion shows no evidence of aneurysm or dissection. No significant stenosis of the major arch vessel origins. Moderate aortic calcific atherosclerosis. Right carotid system: No evidence of dissection, stenosis (50% or greater) or occlusion. Calcified plaque of the carotid bifurcation with mild less than 50% proximal ICA stenosis. Left carotid system: No  evidence of dissection, stenosis (50% or greater) or occlusion. Mixed plaque of the carotid bifurcation with mild less than 50% proximal ICA stenosis. Vertebral arteries: Codominant. No evidence of dissection, stenosis (50% or greater) or occlusion. Skeleton: Mild cervical spondylosis. No high-grade bony canal stenosis. Other neck: Right upper paratracheal lymphadenopathy measuring 18 x 15 mm (series 8, image 34). Upper chest: Negative. Review of the MIP images confirms the above findings CTA HEAD FINDINGS Anterior circulation: No significant stenosis, proximal occlusion, aneurysm, or vascular malformation. Calcified plaque of the carotid siphons with mild less less than 50% paraclinoid stenosis, greater on the left. Posterior circulation: No high-grade stenosis, proximal occlusion, aneurysm, or vascular malformation. Right vertebral artery calcified plaque with mild to moderate approximately 50% stenosis. Left vertebral artery calcified plaque with mild less than 50% stenosis. Venous sinuses: As permitted by contrast timing, patent. Anatomic variants: None significant. Delayed phase: No abnormal intracranial enhancement. Review of the MIP images confirms the above findings IMPRESSION: 1. Patent carotid and vertebral arteries. No dissection, aneurysm, or hemodynamically significant stenosis utilizing NASCET criteria. 2. Patent anterior and posterior intracranial circulation. No large vessel occlusion, aneurysm, or high-grade stenosis. 3. Atherosclerosis of the aorta, carotid bifurcations, carotid siphons, and bilateral intracranial vertebral arteries. 4. Small left occipital lobe acute infarction better characterized on prior MRI. No new acute intracranial abnormality on noncontrast CT of head. No abnormal enhancement of the brain. 5. Right upper paratracheal lymphadenopathy of uncertain significance. 6. Mild cervical spine spondylosis. Electronically Signed   By: Kristine Garbe M.D.   On: 06/10/2018 01:19    Ct Angio Neck W Or Wo Contrast  Result Date: 06/10/2018 CLINICAL DATA:  67 y/o F; status post NSTEMI and catheterization 06/07/2018. 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 Patient reports blacking out of vision in the lateral quadrant of the right eye. EXAM: CT ANGIOGRAPHY HEAD AND NECK TECHNIQUE: Multidetector CT imaging of the head and neck was performed using the standard protocol during bolus administration of intravenous contrast. Multiplanar CT image reconstructions and MIPs were obtained to evaluate the vascular anatomy. Carotid stenosis measurements (when applicable) are obtained utilizing NASCET criteria, using the distal internal carotid diameter as the denominator. CONTRAST:  50 cc Isovue 370 COMPARISON:  06/08/2018 MRI of the head. FINDINGS: CT HEAD FINDINGS Brain: Faint lucency in the left occipital lobe corresponding to small infarct on prior MRI. No new stroke, hemorrhage, focal mass effect, extra-axial collection, hydrocephalus, or herniation. Few nonspecific hypodensities in white matter are compatible with mild chronic microvascular ischemic changes and there is mild volume loss of the brain. Vascular: As below. Skull: Normal. Negative for fracture or focal lesion. Sinuses: Imaged portions are clear. Orbits: No acute finding. Review of the MIP images confirms the above findings CTA NECK FINDINGS Aortic arch: Standard branching. Imaged portion shows no evidence of aneurysm  or dissection. No significant stenosis of the major arch vessel origins. Moderate aortic calcific atherosclerosis. Right carotid system: No evidence of dissection, stenosis (50% or greater) or occlusion. Calcified plaque of the carotid bifurcation with mild less than 50% proximal ICA stenosis. Left carotid system: No evidence of dissection, stenosis (50% or greater) or occlusion. Mixed plaque of the carotid bifurcation with mild less than 50% proximal ICA stenosis. Vertebral arteries: Codominant. No  evidence of dissection, stenosis (50% or greater) or occlusion. Skeleton: Mild cervical spondylosis. No high-grade bony canal stenosis. Other neck: Right upper paratracheal lymphadenopathy measuring 18 x 15 mm (series 8, image 34). Upper chest: Negative. Review of the MIP images confirms the above findings CTA HEAD FINDINGS Anterior circulation: No significant stenosis, proximal occlusion, aneurysm, or vascular malformation. Calcified plaque of the carotid siphons with mild less less than 50% paraclinoid stenosis, greater on the left. Posterior circulation: No high-grade stenosis, proximal occlusion, aneurysm, or vascular malformation. Right vertebral artery calcified plaque with mild to moderate approximately 50% stenosis. Left vertebral artery calcified plaque with mild less than 50% stenosis. Venous sinuses: As permitted by contrast timing, patent. Anatomic variants: None significant. Delayed phase: No abnormal intracranial enhancement. Review of the MIP images confirms the above findings IMPRESSION: 1. Patent carotid and vertebral arteries. No dissection, aneurysm, or hemodynamically significant stenosis utilizing NASCET criteria. 2. Patent anterior and posterior intracranial circulation. No large vessel occlusion, aneurysm, or high-grade stenosis. 3. Atherosclerosis of the aorta, carotid bifurcations, carotid siphons, and bilateral intracranial vertebral arteries. 4. Small left occipital lobe acute infarction better characterized on prior MRI. No new acute intracranial abnormality on noncontrast CT of head. No abnormal enhancement of the brain. 5. Right upper paratracheal lymphadenopathy of uncertain significance. 6. Mild cervical spine spondylosis. Electronically Signed   By: Kristine Garbe M.D.   On: 06/10/2018 01:19   Mr Brain Wo Contrast  Result Date: 06/08/2018 CLINICAL DATA:  Altered mental status, nausea. Suspect stroke. History of hypertension, hyperlipidemia, diabetes. EXAM: MRI HEAD  WITHOUT CONTRAST TECHNIQUE: Multiplanar, multiecho pulse sequences of the brain and surrounding structures were obtained without intravenous contrast. COMPARISON:  CT HEAD June 07, 2018. FINDINGS: INTRACRANIAL CONTENTS: Subcentimeter foci reduced diffusion LEFT occipital lobe with low ADC values. No susceptibility artifact to suggest hemorrhage. Ventricles and sulci are normal for patient's age. A fused subcentimeter supratentorial and patchy pontine white matter FLAIR T2 hyperintensities. No midline shift, mass effect or masses. No abnormal extra-axial fluid collections. VASCULAR: Normal major intracranial vascular flow voids present at skull base. Loss of LEFT mid to distal posterior cerebral artery flow void versus artifact. SKULL AND UPPER CERVICAL SPINE: No abnormal sellar expansion. No suspicious calvarial bone marrow signal. Craniocervical junction maintained. SINUSES/ORBITS: The mastoid air-cells and included paranasal sinuses are well-aerated.The included ocular globes and orbital contents are non-suspicious. OTHER: None. IMPRESSION: 1. 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 2. Occluded LEFT mid to distal PCA versus artifact. Consider CTA versus MRA head. 3. Mild chronic small vessel ischemic changes. 4. These results will be called to the ordering clinician or representative by the Radiologist Assistant, and communication documented in the PACS or zVision Dashboard. Electronically Signed   By: Elon Alas M.D.   On: 06/08/2018 13:23    PHYSICAL EXAM Blood pressure 135/64, pulse (!) 51, temperature 98.3 F (36.8 C), temperature source Oral, resp. rate 18, height 5\' 5"  (1.651 m), weight 122.4 kg, SpO2 97 %.  HEENT-  Normocephalic, no lesions, without obvious abnormality.  Normal external eye and conjunctiva.  Cardiovascular- S1-S2  audible, pulses palpable throughout   Lungs-no rhonchi or wheezing noted, no excessive working breathing.  Saturations within normal  limits Abdomen- All 4 quadrants palpated and nontender Extremities- Warm, dry and intact Musculoskeletal-no joint tenderness, deformity or swelling Skin-warm and dry, no hyperpigmentation, vitiligo, or suspicious lesions  Neurological Examination Mental Status: Alert, oriented, thought content appropriate.  Speech fluent without evidence of aphasia.  Able to follow  commands without difficulty. Cranial Nerves: II:  Right eye with right peripheral field cut, vision is returning, no problems with left eye movemetns. III,IV, VI: ptosis not present, extra-ocular motions intact bilaterally, pupils equal, round, reactive to light  V,VII: smile asymmetric, right facial droop noted, facial light touch sensation normal bilaterally VIII: hearing normal bilaterally IX,X: uvula rises symmetrically XI: bilateral shoulder shrug XII: midline tongue extension Motor: Right :  Upper extremity   5/5              Left:     Upper extremity   5/5             Lower extremity   5/5              Lower extremity   5/5 Tone and bulk:normal tone throughout; no atrophy noted Sensory: Pinprick and light touch intact throughout, bilaterally Deep Tendon Reflexes: 2+ and symmetric biceps and patella Plantars: Right: downgoing                                Left: downgoing Cerebellar: normal finger-to-nose, normal rapid alternating movements and normal heel-to-shin test Gait: deferred      HOME MEDICATIONS:  Medications Prior to Admission  Medication Sig Dispense Refill  . acyclovir (ZOVIRAX) 400 MG tablet TAKE 1 TABLET EVERY DAY 90 tablet 0  . albuterol (PROVENTIL HFA;VENTOLIN HFA) 108 (90 Base) MCG/ACT inhaler Inhale 1-2 puffs into the lungs every 6 (six) hours as needed for wheezing or shortness of breath.    . ALPRAZolam (XANAX) 0.5 MG tablet TAKE 1 TABLET 2 TIMES A DAY AS NEEDED FOR ANXIETY 45 tablet 2  . amLODipine (NORVASC) 10 MG tablet TAKE 1 TABLET EVERY DAY 90 tablet 0  . B-D UF III MINI PEN NEEDLES  31G X 5 MM MISC USE AS INSTRUCTED TO GIVE LANTUS INJECTION TWICE DAILY 180 each 1  . fluticasone (FLONASE) 50 MCG/ACT nasal spray USE 2 SPRAYS IN EACH NOSTRIL EVERY DAY 48 g 1  . furosemide (LASIX) 20 MG tablet TAKE 1 TABLET (20 MG TOTAL) BY MOUTH DAILY. 90 tablet 0  . HYDROcodone-acetaminophen (NORCO) 7.5-325 MG tablet Take 1 tablet by mouth at bedtime. 30 tablet 0  . Ipratropium-Albuterol (COMBIVENT RESPIMAT) 20-100 MCG/ACT AERS respimat Inhale 1 puff into the lungs every 6 (six) hours as needed for wheezing. 12 g 1  . LANTUS SOLOSTAR 100 UNIT/ML Solostar Pen INJECT 45 UNITS SUBCUTANEOUSLY 2 TIMES DAILY 90 mL 0  . levothyroxine (SYNTHROID, LEVOTHROID) 50 MCG tablet TAKE 1 TABLET EVERY DAY BEFORE BREAKFAST 90 tablet 3  . losartan (COZAAR) 100 MG tablet TAKE 1 TABLET EVERY DAY 90 tablet 0  . metoprolol succinate (TOPROL-XL) 25 MG 24 hr tablet Take 1 tablet (25 mg total) by mouth daily. 90 tablet 3  . montelukast (SINGULAIR) 10 MG tablet TAKE 1 TABLET (10 MG TOTAL) BY MOUTH AT BEDTIME. 90 tablet 1  . NOVOLOG FLEXPEN 100 UNIT/ML FlexPen INJECT 45 UNITS SUBCUTANEOUSLY THREE TIMES DAILY WITH MEALS 120 mL 1  . omeprazole (  PRILOSEC) 40 MG capsule TAKE 1 CAPSULE EVERY DAY 90 capsule 0  . rosuvastatin (CRESTOR) 20 MG tablet Take 1 tablet (20 mg total) by mouth at bedtime. 30 tablet 11  . ACCU-CHEK SOFTCLIX LANCETS lancets Test BG TID 300 each 3      HOSPITAL MEDICATIONS:  . acyclovir  400 mg Oral Daily  . amLODipine  10 mg Oral Daily  . aspirin EC  81 mg Oral Daily  . [START ON 06/11/2018] clopidogrel  75 mg Oral Daily  . fluticasone  2 spray Each Nare Daily  . furosemide  20 mg Oral Daily  . HYDROcodone-acetaminophen  1 tablet Oral QHS  . insulin aspart  0-5 Units Subcutaneous QHS  . insulin aspart  0-9 Units Subcutaneous TID WC  . insulin aspart  15 Units Subcutaneous TID WC  . insulin glargine  25 Units Subcutaneous BID  . iopamidol      . levothyroxine  100 mcg Oral QAC breakfast  .  losartan  100 mg Oral Daily  . montelukast  10 mg Oral QHS  . pantoprazole  80 mg Oral Daily  . rosuvastatin  40 mg Oral QHS  . sodium chloride flush  3 mL Intravenous Q12H    ALLERGIES Allergies  Allergen Reactions  . Atorvastatin Other (See Comments)    Leg weakness.   ASSESSMENT/PLAN Ms. SAI MOURA is a 67 y.o. female with Lt PCA stroke in setting of post heart cath. The etiology is most likely peri-procedural.  Stroke:  MRI head -Lt PCA stroke  CTA Head/neck -No significant stenosis; athero noted  2D Echo -LVH, 60% EF, LA dilation  Diet heart healthy  No anticoag/antiplt prior to admission, now onASA  Patient counseled to be compliant with her antithrombotic medications  Ongoing aggressive stroke risk factor management  Therapy recommendations:SNF  Hypertension  Stable . No role in permissive HTN at this time, avoid hypotension however.  . Long-term BP goal normotensive  Hyperlipidemia  Lipid lowering medication PTA:  Crestor  LDL 97, goal < 70  Current lipid lowering medication:Crestor  Continue statin at discharge  Diabetes  HgbA1c 7.3, goal < 7.0  Controlled  Other Stroke Risk Factors  Advanced age  Obesity, Body mass index is 44.91 kg/m., recommend weight loss, diet and exercise as appropriate   Family hx stroke (parent)  Coronary artery disease  Suspect Obstructive sleep apnea, not on CPAP at home; recommend out pt sleep study  Other Active Problems Non-STEMI with RCA and L Cx with multivessel complex CAD CV surgery advises no surgery d/t poor functional capacity and multiple comorbid issues. To note, the stroke is small and should not be the stand alone factor to not proceed with CV sx or PCI.   Hospital day # 4 -Agree with ASA daily -Currently on Heparin gtt per Cards, if anticoagulation is not indicated at time of d/c, would recommend DAPT for her extensive atherosclerotic disease. -NO DRIVING; unless cleared by  Ophthalmology and Neuro -Out pt neuro follow up. We will sign off. Call back if needed.   Desiree Metzger-Cihelka, ARNP-C To contact Stroke Continuity provider, please refer to [email or web address] After hours, contact General Neurology  06/10/2018 9:37 AM  ATTENDING NOTE: I reviewed above note and agree with the assessment and plan. Pt was seen and examined.   Pt sitting in chair, upset about her dog at home was put into sleep. She was not happy about that. Neuro stable, no hemianopia, but right lower quadrant visual field decreased visual acuity. Other  than that, neuro intact. CTA head and neck showed mild atherosclerosis at aortic arch, b/l ICA bifurcation and siphons but no significant stenosis. Currently on ASA and heparin IV. Once completed heparin IV, will recommend DAPT for stroke and cardiac prevention. Continue crestor. Aggressive risk factor modification. Stroke still more consistent with procedure related but not able to rule out cardioembolic source, will also recommend 30-day cardiac event monitoring as outpatient to rule out A. Fib. Discussed case with Dr. Burt Knack.   Neurology will sign off. Please call with questions. Pt will follow up with stroke clinic NP at [facility] in about 4 weeks. Thanks for the consult.   Rosalin Hawking, MD PhD Stroke Neurology 06/10/2018 2:13 PM    Rosalin Hawking, MD PhD Stroke Neurology 06/10/2018 2:08 PM

## 2018-06-10 NOTE — Clinical Social Work Note (Signed)
Clinical Social Work Assessment  Patient Details  Name: Mary Raymond MRN: [id number] Date of Birth: 1951-06-22  Date of referral:  06/10/18               Reason for consult:  Facility Placement                Permission sought to share information with:  Family Supports Permission granted to share information::     Name::     Kesia Dalto   Agency::  SNF[facility]   Relationship::  daughter   Contact Information:  [phone]  Housing/Transportation Living arrangements for the past 2 months:  Single Family Home Source of Information:  Adult Children Patient Interpreter Needed:  None Criminal Activity/Legal Involvement Pertinent to Current Situation/Hospitalization:  No - Comment as needed Significant Relationships:  Adult Children Lives with:  Self Do you feel safe going back to the place where you live?  No Need for family participation in patient care:  Yes (Comment)  Care giving concerns:  CSW received consult for discharge needs. CSW spoke with patient's daughter, Ria Comment,  regarding PT recommendation of SNF placement at time of discharge. Patient's daughter states that her mother lives alone. Patient's family agree with the discharge plan of SNF placement.     Social Worker assessment / plan:  CSW spoke with patient's daughter concerning possibility of rehab at [facility] before patient returns home.   Employment status:  Other (Comment) Insurance information:  Medicare PT Recommendations:    Information / Referral to community resources:     Patient/Family's Response to care:  Patient recognizes need for rehab before returning home and is agreeable to a SNF to placement. Patient reported preference for [facility].  Patient/Family's Understanding of and Emotional Response to Diagnosis, Current Treatment, and Prognosis:  Family is realistic regarding therapy needs and expressed being hopeful for SNF placement. Patient's family expressed understanding of CSW role and discharge  process as well as medical condition. No questions/concerns about plan or treatment.   Emotional Assessment Appearance:    Attitude/Demeanor/Rapport:  Unable to Assess Affect (typically observed):  Unable to Assess Orientation:  Oriented to Self, Oriented to Place, Oriented to  Time, Oriented to Situation Alcohol / Substance use:  Not Applicable Psych involvement (Current and /or in the community):  No (Comment)  Discharge Needs  Concerns to be addressed:  Care Coordination Readmission within the last 30 days:    Current discharge risk:  Dependent with Mobility Barriers to Discharge:  Continued Medical Work up   [employer], [place] 06/10/2018, 3:54 PM

## 2018-06-10 NOTE — Progress Notes (Signed)
[place] for heparin Indication: ACS/STEMI  Allergies  Allergen Reactions  . Atorvastatin Other (See Comments)    Leg weakness.    Patient Measurements: Height: 5\' 5"  (165.1 cm) Weight: 269 lb 14.4 oz (122.4 kg) IBW/kg (Calculated) : 57 Heparin Dosing Weight: 88 kg  Vital Signs: Temp: 98.3 F (36.8 C) (09/24 0750) Temp Source: Oral (09/24 0750) BP: 151/67 (09/24 0800) Pulse Rate: 64 (09/24 0800)  Labs: Recent Labs    06/08/18 0215 06/09/18 1962 06/09/18 2297 06/09/18 2036 06/10/18 0321 06/10/18 0634  HGB 9.6*  --  10.5*  --   --  9.4*  HCT 33.0*  --  35.3*  --   --  32.0*  PLT 206  --  215  --   --  191  HEPARINUNFRC 0.32 0.61  --  0.35 0.38  --     Estimated Creatinine Clearance: 73.4 mL/min (by C-G formula based on SCr of 0.99 mg/dL).   Medical History: Past Medical History:  Diagnosis Date  . Allergy   . Anxiety   . Asthma   . Diabetes mellitus without complication ([place])   . Gout   . Hyperlipidemia   . Hypertension   . Hypothyroid   . Neuropathy   . Obesity   . Vitamin D deficiency    Assessment: Pharmacy consulted to dose heparin in patient for ACS/STEMI.  She is now s/p cath which showed severe 3 vessel disease.  She has been turned down for cardiac surgery because of poor functional capacity, multiple comorbid medical problems, and new acute stroke this admission. Pharmacy consulted to manage heparin post-cath.   9/24: Heparin level this morning at goal 0.38. CBC stable. No bleeding or infusion issues noted per RN. Will continue reduced heparin goal of 0.3-0.5 with risk of hemorrhagic conversion given recent CVA  Goal of Therapy:  Heparin level 0.3-0.5 units/ml Monitor platelets by anticoagulation protocol: Yes   Plan:  Continue heparin infusion at 1800 units/hr Monitor daily heparin level, CBC, s/sx of bleeding F/u plans for intervention  Thank you for involving pharmacy in this patient's  care.  Janae Bridgeman, PharmD PGY1 Pharmacy Resident Phone: [phone] 06/10/2018 9:00 AM`

## 2018-06-11 ENCOUNTER — Encounter (HOSPITAL_COMMUNITY)
Admission: EM | Disposition: A | Discharge status: Skilled Nursing Facility | Payer: Self-pay | Source: Home / Self Care | Attending: Internal Medicine

## 2018-06-11 HISTORY — PX: CORONARY ATHERECTOMY: CATH118238

## 2018-06-11 HISTORY — PX: CORONARY STENT INTERVENTION: CATH118234

## 2018-06-11 LAB — POCT ACTIVATED CLOTTING TIME: ACTIVATED CLOTTING TIME: 334 s

## 2018-06-11 LAB — BASIC METABOLIC PANEL
ANION GAP: 14 (ref 5–15)
BUN: 24 mg/dL — AB (ref 8–23)
CALCIUM: 9.4 mg/dL (ref 8.9–10.3)
CO2: 24 mmol/L (ref 22–32)
CREATININE: 1.26 mg/dL — AB (ref 0.44–1.00)
Chloride: 102 mmol/L (ref 98–111)
GFR calc Af Amer: 50 mL/min — ABNORMAL LOW (ref >60)
GFR, EST NON AFRICAN AMERICAN: 43 mL/min — AB (ref >60)
GLUCOSE: 176 mg/dL — AB (ref 70–99)
Potassium: 4.9 mmol/L (ref 3.5–5.1)
Sodium: 140 mmol/L (ref 135–145)

## 2018-06-11 LAB — HEPARIN LEVEL (UNFRACTIONATED): Heparin Unfractionated: 0.37 IU/mL (ref 0.30–0.70)

## 2018-06-11 LAB — CBC
HCT: 32.2 % — ABNORMAL LOW (ref 36.0–46.0)
Hemoglobin: 9.5 g/dL — ABNORMAL LOW (ref 12.0–15.0)
MCH: 24.4 pg — ABNORMAL LOW (ref 26.0–34.0)
MCHC: 29.5 g/dL — AB (ref 30.0–36.0)
MCV: 82.8 fL (ref 78.0–100.0)
PLATELETS: 183 10*3/uL (ref 150–400)
RBC: 3.89 MIL/uL (ref 3.87–5.11)
RDW: 16.6 % — AB (ref 11.5–15.5)
WBC: 10.1 10*3/uL (ref 4.0–10.5)

## 2018-06-11 LAB — GLUCOSE, CAPILLARY
GLUCOSE-CAPILLARY: 156 mg/dL — AB (ref 70–99)
Glucose-Capillary: 161 mg/dL — ABNORMAL HIGH (ref 70–99)
Glucose-Capillary: 143 mg/dL — ABNORMAL HIGH (ref 70–99)

## 2018-06-11 SURGERY — CORONARY ATHERECTOMY
Anesthesia: LOCAL

## 2018-06-11 MED ORDER — ENOXAPARIN SODIUM 40 MG/0.4ML ~~LOC~~ SOLN
40.0000 mg | SUBCUTANEOUS | Status: DC
  Administered 2018-06-12 – 2018-06-13 (×2): 40 mg via SUBCUTANEOUS
  Filled 2018-06-11 (×2): qty 0.4

## 2018-06-11 MED ORDER — SODIUM CHLORIDE 0.9% FLUSH: 3.0000 mL | Freq: Two times a day (BID) | INTRAVENOUS | Status: DC

## 2018-06-11 MED ORDER — SODIUM CHLORIDE 0.9 % IV SOLN: 250.0000 mL | INTRAVENOUS | Status: DC | PRN

## 2018-06-11 MED ORDER — SODIUM CHLORIDE 0.9 % WEIGHT BASED INFUSION: 1.0000 mL/kg/h | INTRAVENOUS | Status: DC

## 2018-06-11 MED ORDER — ONDANSETRON HCL 4 MG/2ML IJ SOLN
INTRAMUSCULAR | Status: DC | PRN
  Administered 2018-06-11: 4 mg via INTRAVENOUS

## 2018-06-11 MED ORDER — SODIUM CHLORIDE 0.9% FLUSH
3.0000 mL | Freq: Two times a day (BID) | INTRAVENOUS | Status: DC
  Administered 2018-06-12 – 2018-06-19 (×15): 3 mL via INTRAVENOUS

## 2018-06-11 MED ORDER — MIDAZOLAM HCL 2 MG/2ML IJ SOLN
INTRAMUSCULAR | Status: DC | PRN
  Administered 2018-06-11 (×2): 1 mg via INTRAVENOUS
  Administered 2018-06-11: 2 mg via INTRAVENOUS

## 2018-06-11 MED ORDER — FENTANYL CITRATE (PF) 100 MCG/2ML IJ SOLN
INTRAMUSCULAR | Status: AC
  Filled 2018-06-11: qty 2

## 2018-06-11 MED ORDER — SODIUM CHLORIDE 0.9% FLUSH: 3.0000 mL | INTRAVENOUS | Status: DC | PRN

## 2018-06-11 MED ORDER — AMLODIPINE BESYLATE 10 MG PO TABS
10.0000 mg | ORAL_TABLET | Freq: Every day | ORAL | Status: DC
Start: 2018-06-12 — End: 2018-06-18
  Administered 2018-06-12 – 2018-06-18 (×7): 10 mg via ORAL
  Filled 2018-06-11 (×7): qty 1

## 2018-06-11 MED ORDER — HYDRALAZINE HCL 20 MG/ML IJ SOLN
5.0000 mg | INTRAMUSCULAR | Status: AC | PRN
  Administered 2018-06-11: 5 mg via INTRAVENOUS
  Filled 2018-06-11: qty 1

## 2018-06-11 MED ORDER — NITROGLYCERIN 1 MG/10 ML FOR IR/CATH LAB
INTRA_ARTERIAL | Status: DC | PRN
  Administered 2018-06-11: 150 ug via INTRACORONARY
  Administered 2018-06-11 (×2): 200 ug via INTRACORONARY

## 2018-06-11 MED ORDER — BIVALIRUDIN TRIFLUOROACETATE 250 MG IV SOLR
INTRAVENOUS | Status: AC
  Filled 2018-06-11: qty 250

## 2018-06-11 MED ORDER — SODIUM CHLORIDE 0.9 % WEIGHT BASED INFUSION
3.0000 mL/kg/h | INTRAVENOUS | Status: DC
  Administered 2018-06-11: 3 mL/kg/h via INTRAVENOUS

## 2018-06-11 MED ORDER — HEPARIN (PORCINE) IN NACL 1000-0.9 UT/500ML-% IV SOLN
INTRAVENOUS | Status: DC | PRN
  Administered 2018-06-11 (×3): 500 mL

## 2018-06-11 MED ORDER — BIVALIRUDIN BOLUS VIA INFUSION - CUPID
INTRAVENOUS | Status: DC | PRN
  Administered 2018-06-11: 91.8 mg via INTRAVENOUS

## 2018-06-11 MED ORDER — FUROSEMIDE 10 MG/ML IJ SOLN: 20.0000 mg | Freq: Once | INTRAMUSCULAR | Status: DC

## 2018-06-11 MED ORDER — MIDAZOLAM HCL 2 MG/2ML IJ SOLN
INTRAMUSCULAR | Status: AC
  Filled 2018-06-11: qty 2

## 2018-06-11 MED ORDER — IOHEXOL 350 MG/ML SOLN
INTRAVENOUS | Status: DC | PRN
  Administered 2018-06-11: 145 mL via INTRA_ARTERIAL

## 2018-06-11 MED ORDER — ATROPINE SULFATE 1 MG/10ML IJ SOSY
PREFILLED_SYRINGE | INTRAMUSCULAR | Status: DC | PRN
  Administered 2018-06-11: 0.5 mg via INTRAVENOUS

## 2018-06-11 MED ORDER — HYDRALAZINE HCL 25 MG PO TABS
25.0000 mg | ORAL_TABLET | Freq: Two times a day (BID) | ORAL | Status: DC
  Administered 2018-06-11 – 2018-06-18 (×15): 25 mg via ORAL
  Filled 2018-06-11 (×16): qty 1

## 2018-06-11 MED ORDER — LOSARTAN POTASSIUM 50 MG PO TABS
100.0000 mg | ORAL_TABLET | Freq: Every day | ORAL | Status: DC
  Administered 2018-06-12 – 2018-06-13 (×2): 100 mg via ORAL
  Filled 2018-06-11 (×2): qty 2

## 2018-06-11 MED ORDER — NITROGLYCERIN 1 MG/10 ML FOR IR/CATH LAB
INTRA_ARTERIAL | Status: AC
  Filled 2018-06-11: qty 10

## 2018-06-11 MED ORDER — SODIUM CHLORIDE 0.9 % WEIGHT BASED INFUSION
1.0000 mL/kg/h | INTRAVENOUS | Status: AC
  Administered 2018-06-11: 1 mL/kg/h via INTRAVENOUS

## 2018-06-11 MED ORDER — LIDOCAINE HCL (PF) 1 % IJ SOLN
INTRAMUSCULAR | Status: DC | PRN
  Administered 2018-06-11: 14 mL via SUBCUTANEOUS

## 2018-06-11 MED ORDER — LIDOCAINE HCL (PF) 1 % IJ SOLN
INTRAMUSCULAR | Status: AC
  Filled 2018-06-11: qty 30

## 2018-06-11 MED ORDER — HEPARIN (PORCINE) IN NACL 1000-0.9 UT/500ML-% IV SOLN
INTRAVENOUS | Status: AC
  Filled 2018-06-11: qty 500

## 2018-06-11 MED ORDER — ONDANSETRON HCL 4 MG/2ML IJ SOLN
INTRAMUSCULAR | Status: AC
  Filled 2018-06-11: qty 2

## 2018-06-11 MED ORDER — FENTANYL CITRATE (PF) 100 MCG/2ML IJ SOLN
INTRAMUSCULAR | Status: DC | PRN
  Administered 2018-06-11 (×3): 25 ug via INTRAVENOUS
  Administered 2018-06-11: 50 ug via INTRAVENOUS
  Administered 2018-06-11: 25 ug via INTRAVENOUS

## 2018-06-11 MED ORDER — SODIUM CHLORIDE 0.9 % IV SOLN
INTRAVENOUS | Status: DC | PRN
  Administered 2018-06-11 (×2): 1.75 mg/kg/h via INTRAVENOUS

## 2018-06-11 SURGICAL SUPPLY — 32 items
BALLN SAPPHIRE 3.0X15 (BALLOONS) ×2
BALLN SAPPHIRE ~~LOC~~ 3.0X12 (BALLOONS) ×2 IMPLANT
BALLN SAPPHIRE ~~LOC~~ 3.75X12 (BALLOONS) ×2 IMPLANT
BALLN SAPPHIRE ~~LOC~~ 4.0X15 (BALLOONS) ×2 IMPLANT
BALLOON SAPPHIRE 3.0X15 (BALLOONS) ×1 IMPLANT
CABLE ADAPT CONN TEMP 6FT (ADAPTER) ×2 IMPLANT
CATH S G BIP PACING (SET/KITS/TRAYS/PACK) ×2 IMPLANT
CATH TELEPORT (CATHETERS) ×2 IMPLANT
CATH VISTA GUIDE 6FR AL1 (CATHETERS) ×2 IMPLANT
CROWN DIAMONDBACK CLASSIC 1.25 (BURR) ×2 IMPLANT
DEVICE WIRE ANGIOSEAL 6FR (Vascular Products) ×2 IMPLANT
ELECT DEFIB PAD ADLT CADENCE (PAD) ×2 IMPLANT
HOVERMATT SINGLE USE (MISCELLANEOUS) ×2 IMPLANT
KIT ENCORE 26 ADVANTAGE (KITS) ×2 IMPLANT
KIT HEART LEFT (KITS) ×2 IMPLANT
KIT HEMO VALVE WATCHDOG (MISCELLANEOUS) ×2 IMPLANT
LUBRICANT VIPERSLIDE CORONARY (MISCELLANEOUS) ×2 IMPLANT
PACK CARDIAC CATHETERIZATION (CUSTOM PROCEDURE TRAY) ×2 IMPLANT
PINNACLE LONG 6F 25CM (SHEATH) ×2
SHEATH INTRO PINNACLE 6F 25CM (SHEATH) ×1 IMPLANT
SHEATH PINNACLE 6F 10CM (SHEATH) ×2 IMPLANT
SHEATH PROBE COVER 6X72 (BAG) ×2 IMPLANT
SHIELD RADPAD SCOOP 12X17 (MISCELLANEOUS) ×2 IMPLANT
STENT SYNERGY DES 3.5X20 (Permanent Stent) ×2 IMPLANT
STENT SYNERGY DES 4X20 (Permanent Stent) ×2 IMPLANT
TRANSDUCER W/STOPCOCK (MISCELLANEOUS) ×2 IMPLANT
TUBING CIL FLEX 10 FLL-RA (TUBING) ×2 IMPLANT
WIRE ASAHI PROWATER 180CM (WIRE) ×2 IMPLANT
WIRE ASAHI PROWATER 300CM (WIRE) ×2 IMPLANT
WIRE EMERALD 3MM-J .035X150CM (WIRE) ×2 IMPLANT
WIRE FIGHTER CROSSING 190CM (WIRE) ×2 IMPLANT
WIRE VIPER ADVANCE COR .012TIP (WIRE) ×2 IMPLANT

## 2018-06-11 NOTE — H&P (View-Only) (Signed)
Progress Note  Patient Name: Mary Raymond Date of Encounter: 06/11/2018  Primary Cardiologist: Carlyle Dolly, MD  Subjective   No complaints today.   Inpatient Medications    Scheduled Meds: . acyclovir  400 mg Oral Daily  . amLODipine  10 mg Oral Daily  . aspirin EC  81 mg Oral Daily  . clopidogrel  75 mg Oral Daily  . fluticasone  2 spray Each Nare Daily  . furosemide  20 mg Oral Daily  . HYDROcodone-acetaminophen  1 tablet Oral QHS  . insulin aspart  0-5 Units Subcutaneous QHS  . insulin aspart  0-9 Units Subcutaneous TID WC  . insulin aspart  15 Units Subcutaneous TID WC  . insulin glargine  25 Units Subcutaneous BID  . levothyroxine  100 mcg Oral QAC breakfast  . losartan  100 mg Oral Daily  . montelukast  10 mg Oral QHS  . pantoprazole  80 mg Oral Daily  . rosuvastatin  40 mg Oral QHS  . sodium chloride flush  3 mL Intravenous Q12H  . sodium chloride flush  3 mL Intravenous Q12H   Continuous Infusions: . sodium chloride    . sodium chloride    . sodium chloride 1 mL/kg/hr (06/11/18 1035)  . heparin 1,800 Units/hr (06/11/18 0500)  . nitroGLYCERIN Stopped (06/08/18 0919)   PRN Meds: sodium chloride, sodium chloride, acetaminophen, albuterol, ALPRAZolam, gi cocktail, ipratropium-albuterol, LORazepam, morphine injection, ondansetron (ZOFRAN) IV, sodium chloride flush, sodium chloride flush   Vital Signs    Vitals:   06/11/18 0500 06/11/18 0600 06/11/18 0810 06/11/18 0904  BP: (!) 185/78 (!) 159/66 (!) 156/41 (!) 176/53  Pulse: (!) 51 (!) 47 (!) 52   Resp: 17 16 19    Temp:  97.7 F (36.5 C) 97.8 F (36.6 C)   TempSrc:  Oral Oral   SpO2: 91% 98% 93%   Weight:      Height:        Intake/Output Summary (Last 24 hours) at 06/11/2018 1045 Last data filed at 06/11/2018 1062 Gross per 24 hour  Intake 461.16 ml  Output 3850 ml  Net -3388.84 ml   Filed Weights   06/05/18 1454 06/05/18 1835 06/06/18 1513  Weight: 126.7 kg 125.6 kg 122.4 kg     Telemetry    SB with episodes of 1st degree/ 2nd degree HB - Personally Reviewed  ECG    N/a - Personally Reviewed  Physical Exam   General: Obese W female appearing in no acute distress. Head: Normocephalic, atraumatic.  Neck: Supple without bruits, JVD. Lungs:  Resp regular and unlabored, CTA. Heart: RRR, S1, S2, systolic murmur; no rub. Abdomen: Soft, non-tender, non-distended with normoactive bowel sounds.  Extremities: No clubbing, cyanosis, edema. Distal pedal pulses are 2+ bilaterally. Neuro: Alert and oriented X 3. Moves all extremities spontaneously. Psych: Normal affect.  Labs    Chemistry Recent Labs  Lab 06/06/18 0157 06/07/18 0311 06/11/18 0746  NA 140 138 140  K 4.3 4.8 4.9  CL 106 105 102  CO2 26 24 24   GLUCOSE 126* 260* 176*  BUN 21 16 24*  CREATININE 1.04* 0.99 1.26*  CALCIUM 8.6* 8.7* 9.4  GFRNONAA 55* 58* 43*  GFRAA >60 >60 50*  ANIONGAP 8 9 14      Hematology Recent Labs  Lab 06/09/18 0923 06/10/18 0634 06/11/18 0237  WBC 10.5 10.5 10.1  RBC 4.23 3.84* 3.89  HGB 10.5* 9.4* 9.5*  HCT 35.3* 32.0* 32.2*  MCV 83.5 83.3 82.8  MCH 24.8* 24.5*  24.4*  MCHC 29.7* 29.4* 29.5*  RDW 16.6* 16.6* 16.6*  PLT 215 191 183    Cardiac Enzymes Recent Labs  Lab 06/05/18 1947 06/05/18 2304 06/06/18 0157  TROPONINI 0.25* 0.34* 0.35*    Recent Labs  Lab 06/05/18 1505  TROPIPOC 0.11*     BNP Recent Labs  Lab 06/05/18 1526  BNP 197.0*     DDimer No results for input(s): DDIMER in the last 168 hours.    Radiology    Ct Angio Head W Or Wo Contrast  Result Date: 06/10/2018 CLINICAL DATA:  67 y/o F; status post NSTEMI and catheterization 06/07/2018. 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 Patient reports blacking out of vision in the lateral quadrant of the right eye. EXAM: CT ANGIOGRAPHY HEAD AND NECK TECHNIQUE: Multidetector CT imaging of the head and neck was performed using the standard protocol during bolus  administration of intravenous contrast. Multiplanar CT image reconstructions and MIPs were obtained to evaluate the vascular anatomy. Carotid stenosis measurements (when applicable) are obtained utilizing NASCET criteria, using the distal internal carotid diameter as the denominator. CONTRAST:  50 cc Isovue 370 COMPARISON:  06/08/2018 MRI of the head. FINDINGS: CT HEAD FINDINGS Brain: Faint lucency in the left occipital lobe corresponding to small infarct on prior MRI. No new stroke, hemorrhage, focal mass effect, extra-axial collection, hydrocephalus, or herniation. Few nonspecific hypodensities in white matter are compatible with mild chronic microvascular ischemic changes and there is mild volume loss of the brain. Vascular: As below. Skull: Normal. Negative for fracture or focal lesion. Sinuses: Imaged portions are clear. Orbits: No acute finding. Review of the MIP images confirms the above findings CTA NECK FINDINGS Aortic arch: Standard branching. Imaged portion shows no evidence of aneurysm or dissection. No significant stenosis of the major arch vessel origins. Moderate aortic calcific atherosclerosis. Right carotid system: No evidence of dissection, stenosis (50% or greater) or occlusion. Calcified plaque of the carotid bifurcation with mild less than 50% proximal ICA stenosis. Left carotid system: No evidence of dissection, stenosis (50% or greater) or occlusion. Mixed plaque of the carotid bifurcation with mild less than 50% proximal ICA stenosis. Vertebral arteries: Codominant. No evidence of dissection, stenosis (50% or greater) or occlusion. Skeleton: Mild cervical spondylosis. No high-grade bony canal stenosis. Other neck: Right upper paratracheal lymphadenopathy measuring 18 x 15 mm (series 8, image 34). Upper chest: Negative. Review of the MIP images confirms the above findings CTA HEAD FINDINGS Anterior circulation: No significant stenosis, proximal occlusion, aneurysm, or vascular malformation.  Calcified plaque of the carotid siphons with mild less less than 50% paraclinoid stenosis, greater on the left. Posterior circulation: No high-grade stenosis, proximal occlusion, aneurysm, or vascular malformation. Right vertebral artery calcified plaque with mild to moderate approximately 50% stenosis. Left vertebral artery calcified plaque with mild less than 50% stenosis. Venous sinuses: As permitted by contrast timing, patent. Anatomic variants: None significant. Delayed phase: No abnormal intracranial enhancement. Review of the MIP images confirms the above findings IMPRESSION: 1. Patent carotid and vertebral arteries. No dissection, aneurysm, or hemodynamically significant stenosis utilizing NASCET criteria. 2. Patent anterior and posterior intracranial circulation. No large vessel occlusion, aneurysm, or high-grade stenosis. 3. Atherosclerosis of the aorta, carotid bifurcations, carotid siphons, and bilateral intracranial vertebral arteries. 4. Small left occipital lobe acute infarction better characterized on prior MRI. No new acute intracranial abnormality on noncontrast CT of head. No abnormal enhancement of the brain. 5. Right upper paratracheal lymphadenopathy of uncertain significance. 6. Mild cervical spine  spondylosis. Electronically Signed   By: Kristine Garbe M.D.   On: 06/10/2018 01:19   Ct Angio Neck W Or Wo Contrast  Result Date: 06/10/2018 CLINICAL DATA:  67 y/o F; status post NSTEMI and catheterization 06/07/2018. 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 Patient reports blacking out of vision in the lateral quadrant of the right eye. EXAM: CT ANGIOGRAPHY HEAD AND NECK TECHNIQUE: Multidetector CT imaging of the head and neck was performed using the standard protocol during bolus administration of intravenous contrast. Multiplanar CT image reconstructions and MIPs were obtained to evaluate the vascular anatomy. Carotid stenosis measurements (when applicable) are  obtained utilizing NASCET criteria, using the distal internal carotid diameter as the denominator. CONTRAST:  50 cc Isovue 370 COMPARISON:  06/08/2018 MRI of the head. FINDINGS: CT HEAD FINDINGS Brain: Faint lucency in the left occipital lobe corresponding to small infarct on prior MRI. No new stroke, hemorrhage, focal mass effect, extra-axial collection, hydrocephalus, or herniation. Few nonspecific hypodensities in white matter are compatible with mild chronic microvascular ischemic changes and there is mild volume loss of the brain. Vascular: As below. Skull: Normal. Negative for fracture or focal lesion. Sinuses: Imaged portions are clear. Orbits: No acute finding. Review of the MIP images confirms the above findings CTA NECK FINDINGS Aortic arch: Standard branching. Imaged portion shows no evidence of aneurysm or dissection. No significant stenosis of the major arch vessel origins. Moderate aortic calcific atherosclerosis. Right carotid system: No evidence of dissection, stenosis (50% or greater) or occlusion. Calcified plaque of the carotid bifurcation with mild less than 50% proximal ICA stenosis. Left carotid system: No evidence of dissection, stenosis (50% or greater) or occlusion. Mixed plaque of the carotid bifurcation with mild less than 50% proximal ICA stenosis. Vertebral arteries: Codominant. No evidence of dissection, stenosis (50% or greater) or occlusion. Skeleton: Mild cervical spondylosis. No high-grade bony canal stenosis. Other neck: Right upper paratracheal lymphadenopathy measuring 18 x 15 mm (series 8, image 34). Upper chest: Negative. Review of the MIP images confirms the above findings CTA HEAD FINDINGS Anterior circulation: No significant stenosis, proximal occlusion, aneurysm, or vascular malformation. Calcified plaque of the carotid siphons with mild less less than 50% paraclinoid stenosis, greater on the left. Posterior circulation: No high-grade stenosis, proximal occlusion, aneurysm,  or vascular malformation. Right vertebral artery calcified plaque with mild to moderate approximately 50% stenosis. Left vertebral artery calcified plaque with mild less than 50% stenosis. Venous sinuses: As permitted by contrast timing, patent. Anatomic variants: None significant. Delayed phase: No abnormal intracranial enhancement. Review of the MIP images confirms the above findings IMPRESSION: 1. Patent carotid and vertebral arteries. No dissection, aneurysm, or hemodynamically significant stenosis utilizing NASCET criteria. 2. Patent anterior and posterior intracranial circulation. No large vessel occlusion, aneurysm, or high-grade stenosis. 3. Atherosclerosis of the aorta, carotid bifurcations, carotid siphons, and bilateral intracranial vertebral arteries. 4. Small left occipital lobe acute infarction better characterized on prior MRI. No new acute intracranial abnormality on noncontrast CT of head. No abnormal enhancement of the brain. 5. Right upper paratracheal lymphadenopathy of uncertain significance. 6. Mild cervical spine spondylosis. Electronically Signed   By: Kristine Garbe M.D.   On: 06/10/2018 01:19    Cardiac Studies   Cath: 06/06/18   Prox RCA to Mid RCA lesion is 90% stenosed.  Dist RCA lesion is 80% stenosed.  Mid RCA lesion is 80% stenosed.  Acute Mrg lesion is 40% stenosed.  Mid LM lesion is 55% stenosed.  Ost Cx lesion  is 90% stenosed.  Prox Cx lesion is 85% stenosed.  Ost LAD to Prox LAD lesion is 50% stenosed.   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 There is  55% focal distal left main stenosis, calcification of the proximal LAD with 50% stenosis, 90% ostial left circumflex stenosis followed by 85% calcified proximal stenosis; and a large dominant RCA with severe eccentric calcification with 90% stenosis proximally, 80% stenosis in the region of the crux and 80% stenosis prior to the PDA takeoff with 40% marginal/PDA branch  stenosis.  Preserved global LV contractility with inferior hypocontractility and ejection fraction of approximately 55%.  LVEDP 17 mm Hg.    RECOMMENDATION: Surgical consultation for CABG revascularization.  The patient will be started on heparin therapy 8 hours post sheath removal.  She will also be started on IV nitroglycerin.  I have discussed the catheterization findings with Dr. Servando Snare.  She has been documented to have intermittent second-degree type I block. She will transported to [facility] for further evaluation and treatment prior to subsequent CABG revascularization. Recommend Aspirin 81mg  daily and will not start DAPT  due to need for surgery.  Patient Profile     67 y.o. female with past medical history of HTN, HLD, and type II DM who presented to [facility] ED on 06/05/2018 for evaluation of chest pain. Troponin value elevated to 0.34. Underwent cath with multivessel disease. Felt not to be a candidate for CABG and planned for PCI.   Assessment & Plan    1.  Non-STEMI: Cardiac catheterization noted above. The patient has been appropriately turned down for cardiac surgery because of multiple comorbid medical conditions, very poor functional capacity, and acute stroke.  PCI fel to offer a reasonable treatment option for this patient, and plan discussed with the patient and her daughter yesterday by Dr. Burt Knack. Going for PCI today. Loaded with plavix yesterday, on 75mg  daily now. Likely femoral approach as she may need a temp wire during cath.  -- remains on IV heparin.   2.  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 Having intermittent episodes of 2nd degree AV block, asymptomatic. Will preferentially use clopidogrel rather than ticagrelor in the setting of significant AV block.  3.  Type 2 diabetes: Treated with sliding scale insulin, managed by the hospitalist team.  4.  Hyperlipidemia: Treated with a high intensity statin drug using rosuvastatin  40 mg daily.   5. HTN: blood pressures remains elevated. Will add hydralazine today.   6. Stroke: Lt PCA stroke. Followed by neurology, who signed off yesterday. Vision is improving.   Signed, Reino Bellis, NP  06/11/2018, 10:45 AM  Pager # [phone]   For questions or updates, please contact [place] Please consult [email or web address] for contact info under Cardiology/STEMI.  Patient seen, examined. Available data reviewed. Agree with findings, assessment, and plan as outlined by Reino Bellis, NP-C. On my exam: Vitals:   06/11/18 0904 06/11/18 1125  BP: (!) 176/53 (!) 158/61  Pulse:  (!) 56  Resp:    Temp:  98.1 F (36.7 C)  SpO2:  95%   Pt is alert and oriented, pleasant obese woman in NAD HEENT: normal Neck: JVP - normal Lungs: CTA bilaterally CV: RRR without murmur or gallop Abd: soft, NT, Positive BS, no hepatomegaly Ext: no C/C/E, distal pulses intact and equal Skin: warm/dry no rash  The patient is scheduled for PCI of the right coronary artery today.  I have reviewed her films with Dr. Martinique  who plans to proceed with atherectomy and stenting of the right coronary artery.  We both agree that her circumflex is very unfavorable for percutaneous intervention and will try to manage her medically after her RCA is treated today.  Patient complains of shortness of breath today.  I am going to stop her IV fluids and give her a single dose of IV Lasix 20 mg.  Will order follow-up labs tomorrow morning.  She is not in any respiratory distress and I think she can go forward with her catheterization procedure.  Sherren Mocha, M.D. 06/11/2018 12:35 PM

## 2018-06-11 NOTE — Progress Notes (Addendum)
Progress Note  Patient Name: Mary Raymond Date of Encounter: 06/11/2018  Primary Cardiologist: Carlyle Dolly, MD  Subjective   No complaints today.   Inpatient Medications    Scheduled Meds: . acyclovir  400 mg Oral Daily  . amLODipine  10 mg Oral Daily  . aspirin EC  81 mg Oral Daily  . clopidogrel  75 mg Oral Daily  . fluticasone  2 spray Each Nare Daily  . furosemide  20 mg Oral Daily  . HYDROcodone-acetaminophen  1 tablet Oral QHS  . insulin aspart  0-5 Units Subcutaneous QHS  . insulin aspart  0-9 Units Subcutaneous TID WC  . insulin aspart  15 Units Subcutaneous TID WC  . insulin glargine  25 Units Subcutaneous BID  . levothyroxine  100 mcg Oral QAC breakfast  . losartan  100 mg Oral Daily  . montelukast  10 mg Oral QHS  . pantoprazole  80 mg Oral Daily  . rosuvastatin  40 mg Oral QHS  . sodium chloride flush  3 mL Intravenous Q12H  . sodium chloride flush  3 mL Intravenous Q12H   Continuous Infusions: . sodium chloride    . sodium chloride    . sodium chloride 1 mL/kg/hr (06/11/18 1035)  . heparin 1,800 Units/hr (06/11/18 0500)  . nitroGLYCERIN Stopped (06/08/18 0919)   PRN Meds: sodium chloride, sodium chloride, acetaminophen, albuterol, ALPRAZolam, gi cocktail, ipratropium-albuterol, LORazepam, morphine injection, ondansetron (ZOFRAN) IV, sodium chloride flush, sodium chloride flush   Vital Signs    Vitals:   06/11/18 0500 06/11/18 0600 06/11/18 0810 06/11/18 0904  BP: (!) 185/78 (!) 159/66 (!) 156/41 (!) 176/53  Pulse: (!) 51 (!) 47 (!) 52   Resp: 17 16 19    Temp:  97.7 F (36.5 C) 97.8 F (36.6 C)   TempSrc:  Oral Oral   SpO2: 91% 98% 93%   Weight:      Height:        Intake/Output Summary (Last 24 hours) at 06/11/2018 1045 Last data filed at 06/11/2018 4742 Gross per 24 hour  Intake 461.16 ml  Output 3850 ml  Net -3388.84 ml   Filed Weights   06/05/18 1454 06/05/18 1835 06/06/18 1513  Weight: 126.7 kg 125.6 kg 122.4 kg     Telemetry    SB with episodes of 1st degree/ 2nd degree HB - Personally Reviewed  ECG    N/a - Personally Reviewed  Physical Exam   General: Obese W female appearing in no acute distress. Head: Normocephalic, atraumatic.  Neck: Supple without bruits, JVD. Lungs:  Resp regular and unlabored, CTA. Heart: RRR, S1, S2, systolic murmur; no rub. Abdomen: Soft, non-tender, non-distended with normoactive bowel sounds.  Extremities: No clubbing, cyanosis, edema. Distal pedal pulses are 2+ bilaterally. Neuro: Alert and oriented X 3. Moves all extremities spontaneously. Psych: Normal affect.  Labs    Chemistry Recent Labs  Lab 06/06/18 0157 06/07/18 0311 06/11/18 0746  NA 140 138 140  K 4.3 4.8 4.9  CL 106 105 102  CO2 26 24 24   GLUCOSE 126* 260* 176*  BUN 21 16 24*  CREATININE 1.04* 0.99 1.26*  CALCIUM 8.6* 8.7* 9.4  GFRNONAA 55* 58* 43*  GFRAA >60 >60 50*  ANIONGAP 8 9 14      Hematology Recent Labs  Lab 06/09/18 0923 06/10/18 0634 06/11/18 0237  WBC 10.5 10.5 10.1  RBC 4.23 3.84* 3.89  HGB 10.5* 9.4* 9.5*  HCT 35.3* 32.0* 32.2*  MCV 83.5 83.3 82.8  MCH 24.8* 24.5*  24.4*  MCHC 29.7* 29.4* 29.5*  RDW 16.6* 16.6* 16.6*  PLT 215 191 183    Cardiac Enzymes Recent Labs  Lab 06/05/18 1947 06/05/18 2304 06/06/18 0157  TROPONINI 0.25* 0.34* 0.35*    Recent Labs  Lab 06/05/18 1505  TROPIPOC 0.11*     BNP Recent Labs  Lab 06/05/18 1526  BNP 197.0*     DDimer No results for input(s): DDIMER in the last 168 hours.    Radiology    Ct Angio Head W Or Wo Contrast  Result Date: 06/10/2018 CLINICAL DATA:  67 y/o F; status post NSTEMI and catheterization 06/07/2018. 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 Patient reports blacking out of vision in the lateral quadrant of the right eye. EXAM: CT ANGIOGRAPHY HEAD AND NECK TECHNIQUE: Multidetector CT imaging of the head and neck was performed using the standard protocol during bolus  administration of intravenous contrast. Multiplanar CT image reconstructions and MIPs were obtained to evaluate the vascular anatomy. Carotid stenosis measurements (when applicable) are obtained utilizing NASCET criteria, using the distal internal carotid diameter as the denominator. CONTRAST:  50 cc Isovue 370 COMPARISON:  06/08/2018 MRI of the head. FINDINGS: CT HEAD FINDINGS Brain: Faint lucency in the left occipital lobe corresponding to small infarct on prior MRI. No new stroke, hemorrhage, focal mass effect, extra-axial collection, hydrocephalus, or herniation. Few nonspecific hypodensities in white matter are compatible with mild chronic microvascular ischemic changes and there is mild volume loss of the brain. Vascular: As below. Skull: Normal. Negative for fracture or focal lesion. Sinuses: Imaged portions are clear. Orbits: No acute finding. Review of the MIP images confirms the above findings CTA NECK FINDINGS Aortic arch: Standard branching. Imaged portion shows no evidence of aneurysm or dissection. No significant stenosis of the major arch vessel origins. Moderate aortic calcific atherosclerosis. Right carotid system: No evidence of dissection, stenosis (50% or greater) or occlusion. Calcified plaque of the carotid bifurcation with mild less than 50% proximal ICA stenosis. Left carotid system: No evidence of dissection, stenosis (50% or greater) or occlusion. Mixed plaque of the carotid bifurcation with mild less than 50% proximal ICA stenosis. Vertebral arteries: Codominant. No evidence of dissection, stenosis (50% or greater) or occlusion. Skeleton: Mild cervical spondylosis. No high-grade bony canal stenosis. Other neck: Right upper paratracheal lymphadenopathy measuring 18 x 15 mm (series 8, image 34). Upper chest: Negative. Review of the MIP images confirms the above findings CTA HEAD FINDINGS Anterior circulation: No significant stenosis, proximal occlusion, aneurysm, or vascular malformation.  Calcified plaque of the carotid siphons with mild less less than 50% paraclinoid stenosis, greater on the left. Posterior circulation: No high-grade stenosis, proximal occlusion, aneurysm, or vascular malformation. Right vertebral artery calcified plaque with mild to moderate approximately 50% stenosis. Left vertebral artery calcified plaque with mild less than 50% stenosis. Venous sinuses: As permitted by contrast timing, patent. Anatomic variants: None significant. Delayed phase: No abnormal intracranial enhancement. Review of the MIP images confirms the above findings IMPRESSION: 1. Patent carotid and vertebral arteries. No dissection, aneurysm, or hemodynamically significant stenosis utilizing NASCET criteria. 2. Patent anterior and posterior intracranial circulation. No large vessel occlusion, aneurysm, or high-grade stenosis. 3. Atherosclerosis of the aorta, carotid bifurcations, carotid siphons, and bilateral intracranial vertebral arteries. 4. Small left occipital lobe acute infarction better characterized on prior MRI. No new acute intracranial abnormality on noncontrast CT of head. No abnormal enhancement of the brain. 5. Right upper paratracheal lymphadenopathy of uncertain significance. 6. Mild cervical spine  spondylosis. Electronically Signed   By: Kristine Garbe M.D.   On: 06/10/2018 01:19   Ct Angio Neck W Or Wo Contrast  Result Date: 06/10/2018 CLINICAL DATA:  67 y/o F; status post NSTEMI and catheterization 06/07/2018. 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 Patient reports blacking out of vision in the lateral quadrant of the right eye. EXAM: CT ANGIOGRAPHY HEAD AND NECK TECHNIQUE: Multidetector CT imaging of the head and neck was performed using the standard protocol during bolus administration of intravenous contrast. Multiplanar CT image reconstructions and MIPs were obtained to evaluate the vascular anatomy. Carotid stenosis measurements (when applicable) are  obtained utilizing NASCET criteria, using the distal internal carotid diameter as the denominator. CONTRAST:  50 cc Isovue 370 COMPARISON:  06/08/2018 MRI of the head. FINDINGS: CT HEAD FINDINGS Brain: Faint lucency in the left occipital lobe corresponding to small infarct on prior MRI. No new stroke, hemorrhage, focal mass effect, extra-axial collection, hydrocephalus, or herniation. Few nonspecific hypodensities in white matter are compatible with mild chronic microvascular ischemic changes and there is mild volume loss of the brain. Vascular: As below. Skull: Normal. Negative for fracture or focal lesion. Sinuses: Imaged portions are clear. Orbits: No acute finding. Review of the MIP images confirms the above findings CTA NECK FINDINGS Aortic arch: Standard branching. Imaged portion shows no evidence of aneurysm or dissection. No significant stenosis of the major arch vessel origins. Moderate aortic calcific atherosclerosis. Right carotid system: No evidence of dissection, stenosis (50% or greater) or occlusion. Calcified plaque of the carotid bifurcation with mild less than 50% proximal ICA stenosis. Left carotid system: No evidence of dissection, stenosis (50% or greater) or occlusion. Mixed plaque of the carotid bifurcation with mild less than 50% proximal ICA stenosis. Vertebral arteries: Codominant. No evidence of dissection, stenosis (50% or greater) or occlusion. Skeleton: Mild cervical spondylosis. No high-grade bony canal stenosis. Other neck: Right upper paratracheal lymphadenopathy measuring 18 x 15 mm (series 8, image 34). Upper chest: Negative. Review of the MIP images confirms the above findings CTA HEAD FINDINGS Anterior circulation: No significant stenosis, proximal occlusion, aneurysm, or vascular malformation. Calcified plaque of the carotid siphons with mild less less than 50% paraclinoid stenosis, greater on the left. Posterior circulation: No high-grade stenosis, proximal occlusion, aneurysm,  or vascular malformation. Right vertebral artery calcified plaque with mild to moderate approximately 50% stenosis. Left vertebral artery calcified plaque with mild less than 50% stenosis. Venous sinuses: As permitted by contrast timing, patent. Anatomic variants: None significant. Delayed phase: No abnormal intracranial enhancement. Review of the MIP images confirms the above findings IMPRESSION: 1. Patent carotid and vertebral arteries. No dissection, aneurysm, or hemodynamically significant stenosis utilizing NASCET criteria. 2. Patent anterior and posterior intracranial circulation. No large vessel occlusion, aneurysm, or high-grade stenosis. 3. Atherosclerosis of the aorta, carotid bifurcations, carotid siphons, and bilateral intracranial vertebral arteries. 4. Small left occipital lobe acute infarction better characterized on prior MRI. No new acute intracranial abnormality on noncontrast CT of head. No abnormal enhancement of the brain. 5. Right upper paratracheal lymphadenopathy of uncertain significance. 6. Mild cervical spine spondylosis. Electronically Signed   By: Kristine Garbe M.D.   On: 06/10/2018 01:19    Cardiac Studies   Cath: 06/06/18   Prox RCA to Mid RCA lesion is 90% stenosed.  Dist RCA lesion is 80% stenosed.  Mid RCA lesion is 80% stenosed.  Acute Mrg lesion is 40% stenosed.  Mid LM lesion is 55% stenosed.  Ost Cx lesion  is 90% stenosed.  Prox Cx lesion is 85% stenosed.  Ost LAD to Prox LAD lesion is 50% stenosed.   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 There is  55% focal distal left main stenosis, calcification of the proximal LAD with 50% stenosis, 90% ostial left circumflex stenosis followed by 85% calcified proximal stenosis; and a large dominant RCA with severe eccentric calcification with 90% stenosis proximally, 80% stenosis in the region of the crux and 80% stenosis prior to the PDA takeoff with 40% marginal/PDA branch  stenosis.  Preserved global LV contractility with inferior hypocontractility and ejection fraction of approximately 55%.  LVEDP 17 mm Hg.    RECOMMENDATION: Surgical consultation for CABG revascularization.  The patient will be started on heparin therapy 8 hours post sheath removal.  She will also be started on IV nitroglycerin.  I have discussed the catheterization findings with Dr. Servando Snare.  She has been documented to have intermittent second-degree type I block. She will transported to [facility] for further evaluation and treatment prior to subsequent CABG revascularization. Recommend Aspirin 81mg  daily and will not start DAPT  due to need for surgery.  Patient Profile     67 y.o. female with past medical history of HTN, HLD, and type II DM who presented to [facility] ED on 06/05/2018 for evaluation of chest pain. Troponin value elevated to 0.34. Underwent cath with multivessel disease. Felt not to be a candidate for CABG and planned for PCI.   Assessment & Plan    1.  Non-STEMI: Cardiac catheterization noted above. The patient has been appropriately turned down for cardiac surgery because of multiple comorbid medical conditions, very poor functional capacity, and acute stroke.  PCI fel to offer a reasonable treatment option for this patient, and plan discussed with the patient and her daughter yesterday by Dr. Burt Knack. Going for PCI today. Loaded with plavix yesterday, on 75mg  daily now. Likely femoral approach as she may need a temp wire during cath.  -- remains on IV heparin.   2.  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 Having intermittent episodes of 2nd degree AV block, asymptomatic. Will preferentially use clopidogrel rather than ticagrelor in the setting of significant AV block.  3.  Type 2 diabetes: Treated with sliding scale insulin, managed by the hospitalist team.  4.  Hyperlipidemia: Treated with a high intensity statin drug using rosuvastatin  40 mg daily.   5. HTN: blood pressures remains elevated. Will add hydralazine today.   6. Stroke: Lt PCA stroke. Followed by neurology, who signed off yesterday. Vision is improving.   Signed, Reino Bellis, NP  06/11/2018, 10:45 AM  Pager # [phone]   For questions or updates, please contact [place] Please consult [email or web address] for contact info under Cardiology/STEMI.  Patient seen, examined. Available data reviewed. Agree with findings, assessment, and plan as outlined by Reino Bellis, NP-C. On my exam: Vitals:   06/11/18 0904 06/11/18 1125  BP: (!) 176/53 (!) 158/61  Pulse:  (!) 56  Resp:    Temp:  98.1 F (36.7 C)  SpO2:  95%   Pt is alert and oriented, pleasant obese woman in NAD HEENT: normal Neck: JVP - normal Lungs: CTA bilaterally CV: RRR without murmur or gallop Abd: soft, NT, Positive BS, no hepatomegaly Ext: no C/C/E, distal pulses intact and equal Skin: warm/dry no rash  The patient is scheduled for PCI of the right coronary artery today.  I have reviewed her films with Dr. Martin[place]  who plans to proceed with atherectomy and stenting of the right coronary artery.  We both agree that her circumflex is very unfavorable for percutaneous intervention and will try to manage her medically after her RCA is treated today.  Patient complains of shortness of breath today.  I am going to stop her IV fluids and give her a single dose of IV Lasix 20 mg.  Will order follow-up labs tomorrow morning.  She is not in any respiratory distress and I think she can go forward with her catheterization procedure.  Sherren Mocha, M.D. 06/11/2018 12:35 PM

## 2018-06-11 NOTE — Progress Notes (Signed)
Patient transferred to [place] bed 10, with belongings (cell phone, charger and clothes). Patient remains on heparin gtt. Report given to [place].

## 2018-06-11 NOTE — Progress Notes (Signed)
Physical Therapy Treatment Patient Details Name: Mary Raymond MRN: [id number] DOB: [birth year] Today's Date: 06/11/2018    History of Present Illness 67 y.o. female admitted to [facility] 9/19 with NSTEMI with heart cath on 9/20, and pt with right visual loss (9/22) and MRI showing small subcentimeter left occipital infarcts. PMHx:  HTN, HLD, type II DM, obesity, gout    PT Comments    Pt admitted with above diagnosis. Pt currently with functional limitations due to balance and endurance deficits. Pt was able to ambulate with RW in room with min guard assist. Pt moves slowly in each transition.  Safe gait today.  Still needs assist for hygiene.  Will follow acutely.   Pt will benefit from skilled PT to increase their independence and safety with mobility to allow discharge to the venue listed below.    Follow Up Recommendations  SNF;Supervision for mobility/OOB     Equipment Recommendations  Rolling walker with 5" wheels    Recommendations for Other Services       Precautions / Restrictions Precautions Precautions: Fall Precaution Comments: Right visual field cut Restrictions Weight Bearing Restrictions: No    Mobility  Bed Mobility Overal bed mobility: Modified Independent Bed Mobility: Rolling;Sidelying to Sit Rolling: Modified independent (Device/Increase time) Sidelying to sit: Modified independent (Device/Increase time) Supine to sit: Supervision;HOB elevated     General bed mobility comments: use of rail to roll and rise  Transfers Overall transfer level: Needs assistance Equipment used: Rolling walker (2 wheeled) Transfers: Sit to/from Stand Sit to Stand: Min guard         General transfer comment: min guard for sit to stand.   Ambulation/Gait Ambulation/Gait assistance: Min guard Gait Distance (Feet): 45 Feet Assistive device: Rolling walker (2 wheeled) Gait Pattern/deviations: Step-through pattern;Decreased stride length;Trunk flexed Gait  velocity: decreased  Gait velocity interpretation: <1.8 ft/sec, indicate of risk for recurrent falls General Gait Details: trunk flexed, use of RW with min guard. Pt fatigues quickly however pt was able to ambulate to door and back x 2.  Pt states that this is the distance she ambulates at home.  Pt was wet on arrival as purewick had leaked.  also cleaned pt and changed gown.    Stairs             Wheelchair Mobility    Modified Rankin (Stroke Patients Only) Modified Rankin (Stroke Patients Only) Pre-Morbid Rankin Score: Moderate disability Modified Rankin: Moderately severe disability     Balance Overall balance assessment: Needs assistance Sitting-balance support: No upper extremity supported;Feet supported Sitting balance-Leahy Scale: Fair Sitting balance - Comments: Able to maintain static sitting   Standing balance support: Bilateral upper extremity supported;During functional activity Standing balance-Leahy Scale: Fair Standing balance comment: Able to maintain static standing without UE support                            Cognition Arousal/Alertness: Awake/alert Behavior During Therapy: WFL for tasks assessed/performed Overall Cognitive Status: Impaired/Different from baseline Area of Impairment: Following commands;Memory;Problem solving;Safety/judgement;Attention                   Current Attention Level: Sustained Memory: Decreased short-term memory Following Commands: Follows one step commands with increased time Safety/Judgement: Decreased awareness of safety;Decreased awareness of deficits   Problem Solving: Slow processing;Requires verbal cues General Comments: Pt requiring increased time and cues presenting with slow processing. Pt with decreased ST memory and requiring Min cues for recall.  Exercises      General Comments        Pertinent Vitals/Pain Pain Assessment: No/denies pain    Home Living                       Prior Function            PT Goals (current goals can now be found in the care plan section) Acute Rehab PT Goals Patient Stated Goal: return home Progress towards PT goals: Progressing toward goals    Frequency    Min 3X/week      PT Plan Current plan remains appropriate    Co-evaluation              AM-PAC PT "6 Clicks" Daily Activity  Outcome Measure  Difficulty turning over in bed (including adjusting bedclothes, sheets and blankets)?: Unable Difficulty moving from lying on back to sitting on the side of the bed? : Unable Difficulty sitting down on and standing up from a chair with arms (e.g., wheelchair, bedside commode, etc,.)?: A Little Help needed moving to and from a bed to chair (including a wheelchair)?: A Little Help needed walking in hospital room?: A Little Help needed climbing 3-5 steps with a railing? : A Lot 6 Click Score: 13    End of Session Equipment Utilized During Treatment: Gait belt Activity Tolerance: Patient limited by fatigue Patient left: with call bell/phone within reach;in bed(pt going for procedure so wwent back to bed) Nurse Communication: Mobility status PT Visit Diagnosis: Unsteadiness on feet (R26.81);Other abnormalities of gait and mobility (R26.89);Muscle weakness (generalized) (M62.81);Other symptoms and signs involving the nervous system (R29.898)     Time: 6606-0045 PT Time Calculation (min) (ACUTE ONLY): 46 min  Charges:  $Gait Training: 8-22 mins $Therapeutic Activity: 8-22 mins $Self Care/Home Management: 8-22                     [place] Pager:  [phone]  Office:  [place] 06/11/2018, 1:45 PM

## 2018-06-11 NOTE — Progress Notes (Signed)
PROGRESS NOTE    MARJI KUEHNEL  OEV:[id number] DOB: [birth year] DOA: 06/05/2018 [name], FNP      Brief Narrative:  Mary Raymond is a 67 y.o. F with fibromyalgia, morbid obesity, HTN, DM who prsetned with chest pain, found in the ER to have elevated troponin.       Assessment & Plan:  NSTEMI LHC showed 3 vessel disease in diabetic.  Evaluated by CT surgery, who recommended not to do coronary bypass grafting due to extensive comorbidities.  To cath today for possible stenting by Cardiology -Continue aspirin -Continue Plavix -Continue Crestor 40 -Continue Losartan -Beta-blocker contraindicated with sec deg HB -EF assessed, 60-65%   LEFT Occipital lobe CVA -Non-invasive angiography showed vertebral artery, aortic atherosclerosis, no significant carotid stenosis -Echocardiogram showed no cardiogenic source of embolism   -Lipids ordered: on Crestor -Aspirin ordered at admission  -Atrial fibrillation: not present, although Neurology recommend 30 day event monitor -tPA not given because no LVO present -PT eval ordered: recommended SNF -Smoking cessation: not pertintent  Hypertension -Continue amlodipine -Continue losartan -Continue hydralazine -Continue Lasix  Diabetes Glucoses reasonable control -Continue glargine -Continue SSI corrections  Other medications -Continue acyclovir -Continue norco  PRN -Continue Singulair -Continue pantoprazole  Hypothyroidism -Continue levothyroxine  ELevated creatinine Baseline 0.9, up to 1.2 today. -Daily BMP  Anemia, presumed chronic disease Stable  Second degree Type 2 AV block Avoid nodal blocking agents.      DVT prophylaxis: N/A on heparin Code Status: FULL Family Communication: None present MDM and disposition Plan: The below labs and imaging reports were reviewed and summarized above.  Medication management as above.  The patient was admitted with NSTEMI, found to have small stroke.  To  cath today.  Placement afterwards.   Consultants:   Cardiology  CT surgery  Neurology  Procedures:   Echocardiogram 9/20 LV EF: 60% -   65%  ------------------------------------------------------------------- Indications:      Chest pain 786.51.  ------------------------------------------------------------------- History:   PMH:  Elevated Troponin.  Risk factors:  Hypertension. Dyslipidemia.  ------------------------------------------------------------------- Study Conclusions  - Left ventricle: The cavity size was normal. Wall thickness was   increased in a pattern of moderate LVH. Systolic function was   normal. The estimated ejection fraction was in the range of 60%   to 65%. Wall motion was normal; there were no regional wall   motion abnormalities. Indeterminate diastolic function. - Aortic valve: Moderately calcified annulus. Trileaflet; mildly   calcified leaflets. - Mitral valve: Moderately calcified annulus. There was mild   regurgitation. - Left atrium: The atrium was mildly dilated. - Right atrium: Central venous pressure (est): 15 mm Hg. - Atrial septum: No defect or patent foramen ovale was identified. - Tricuspid valve: There was trivial regurgitation. - Pulmonary arteries: Systolic pressure could not be accurately   estimated. - Pericardium, extracardiac: A prominent pericardial fat pad was   present.   Left heart catheterization 9/20  Prox RCA to Mid RCA lesion is 90% stenosed.  Dist RCA lesion is 80% stenosed.  Mid RCA lesion is 80% stenosed.  Acute Mrg lesion is 40% stenosed.  Mid LM lesion is 55% stenosed.  Ost Cx lesion is 90% stenosed.  Prox Cx lesion is 85% stenosed.  Ost LAD to Prox LAD lesion is 50% stenosed.   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 There is  55% focal distal left main stenosis, calcification of the proximal LAD with 50% stenosis, 90% ostial left circumflex stenosis followed by 85%  calcified proximal  stenosis; and a large dominant RCA with severe eccentric calcification with 90% stenosis proximally, 80% stenosis in the region of the crux and 80% stenosis prior to the PDA takeoff with 40% marginal/PDA branch stenosis.  Preserved global LV contractility with inferior hypocontractility and ejection fraction of approximately 55%.  LVEDP 17 mm Hg.    RECOMMENDATION: Surgical consultation for CABG revascularization.  The patient will be started on heparin therapy 8 hours post sheath removal.  She will also be started on IV nitroglycerin.  I have discussed the catheterization findings with Dr. Servando Snare.  She has been documented to have intermittent second-degree type I block. She will transported to [facility] for further evaluation and treatment prior to subsequent CABG revascularization. Recommend Aspirin 81mg  daily and will not start DAPT  due to need for surgery.   Carotid US and bilateral ABI 9/22 Final Interpretation: Right Carotid: Velocities in the right ICA are consistent with a 1-39% stenosis.  Left Carotid: Velocities in the left ICA are consistent with a 1-39% stenosis.  Right ABI: Resting right ankle-brachial index is within normal range. No evidence of significant right lower extremity arterial disease. Left ABI: Resting left ankle-brachial index is within normal range. No evidence of significant left lower extremity arterial disease.  Right Upper Extremity: Allen's test not performed due to TR band. Left Upper Extremity: Doppler waveform obliterate with left radial compression. Doppler waveforms remain within normal limits with left ulnar compression.    Left heart catheterization 06/11/2018   Antimicrobials:   None    Subjective: No active chest pain.  Moderate dyspnea.  No leg discomfort, abdomen discomfort.  No fever, cough, sputum. Vision still with blindness, mild improvement, no focal weakness or numbness.  Objective: Vitals:   06/11/18 0904 06/11/18  1125 06/11/18 1422 06/11/18 1440  BP: (!) 176/53 (!) 158/61    Pulse:  (!) 56    Resp:      Temp:  98.1 F (36.7 C)    TempSrc:  Oral    SpO2:  95% 93% 95%  Weight:      Height:        Intake/Output Summary (Last 24 hours) at 06/11/2018 1617 Last data filed at 06/11/2018 1254 Gross per 24 hour  Intake 353.27 ml  Output 4500 ml  Net -4146.73 ml   Filed Weights   06/05/18 1454 06/05/18 1835 06/06/18 1513  Weight: 126.7 kg 125.6 kg 122.4 kg    Examination: General appearance: obese adult female, alert and in no acute distress.   HEENT: Anicteric, conjunctiva pink, lids and lashes normal. No nasal deformity, discharge, epistaxis.  Lips moist, dentition normal, no oral lesions, OP moist, hearing normal.   Skin: Warm and dry.  no jaundice.  No suspicious rashes or lesions. Cardiac: RRR, nl G2-E3, soft systolic murmur.  Capillary refill is brisk.  JVP not visible.  No LE edema.  Radia  pulses 2+ and symmetric. Respiratory: Normal respiratory rate and rhythm.  CTAB without rales or wheezes. Abdomen: Abdomen soft.  no TTP. No ascites, distension, hepatosplenomegaly.   MSK: No deformities or effusions. Neuro: Awake and alert.  Visual field defect in right eye, otherwise, CNs normal, EOMI, moves all extremities with equal strength and coordination. Speech fluent.    Psych: Sensorium intact and responding to questions, attention normal. Affect blunted.  Judgment and insight appear normal.    Data Reviewed: I have personally reviewed following labs and imaging studies:  CBC: Recent Labs  Lab 06/07/18 0311 06/08/18 0215 06/09/18 6629 06/10/18 0634 06/11/18  0237  WBC 10.7* 10.5 10.5 10.5 10.1  HGB 9.7* 9.6* 10.5* 9.4* 9.5*  HCT 32.8* 33.0* 35.3* 32.0* 32.2*  MCV 82.4 83.3 83.5 83.3 82.8  PLT 227 206 215 191 485   Basic Metabolic Panel: Recent Labs  Lab 06/05/18 1456 06/06/18 0157 06/07/18 0311 06/11/18 0746  NA 139 140 138 140  K 4.0 4.3 4.8 4.9  CL 106 106 105 102  CO2  25 26 24 24   GLUCOSE 70 126* 260* 176*  BUN 20 21 16  24*  CREATININE 0.97 1.04* 0.99 1.26*  CALCIUM 9.1 8.6* 8.7* 9.4   GFR: Estimated Creatinine Clearance: 57.7 mL/min (A) (by C-G formula based on SCr of 1.26 mg/dL (H)). Liver Function Tests: No results for input(s): AST, ALT, ALKPHOS, BILITOT, PROT, ALBUMIN in the last 168 hours. No results for input(s): LIPASE, AMYLASE in the last 168 hours. No results for input(s): AMMONIA in the last 168 hours. Coagulation Profile: No results for input(s): INR, PROTIME in the last 168 hours. Cardiac Enzymes: Recent Labs  Lab 06/05/18 1947 06/05/18 2304 06/06/18 0157  TROPONINI 0.25* 0.34* 0.35*   BNP (last 3 results) No results for input(s): PROBNP in the last 8760 hours. HbA1C: No results for input(s): HGBA1C in the last 72 hours. CBG: Recent Labs  Lab 06/10/18 1224 06/10/18 1621 06/10/18 2135 06/11/18 0808 06/11/18 1127  GLUCAP 151* 189* 227* 161* 143*   Lipid Profile: Recent Labs    06/09/18 0650  CHOL 170  HDL 40*  LDLCALC 97  TRIG 165*  CHOLHDL 4.3   Thyroid Function Tests: No results for input(s): TSH, T4TOTAL, FREET4, T3FREE, THYROIDAB in the last 72 hours. Anemia Panel: No results for input(s): VITAMINB12, FOLATE, FERRITIN, TIBC, IRON, RETICCTPCT in the last 72 hours. Urine analysis:    Component Value Date/Time   COLORURINE YELLOW 09/12/2017 1047   APPEARANCEUR Clear 02/25/2018 1050   LABSPEC 1.017 09/12/2017 1047   PHURINE 5.0 09/12/2017 1047   GLUCOSEU Negative 02/25/2018 1050   HGBUR SMALL (A) 09/12/2017 1047   BILIRUBINUR Negative 02/25/2018 1050   KETONESUR NEGATIVE 09/12/2017 1047   PROTEINUR 3+ (A) 02/25/2018 1050   PROTEINUR >=300 (A) 09/12/2017 1047   UROBILINOGEN negative 11/14/2015 0949   NITRITE Negative 02/25/2018 1050   NITRITE NEGATIVE 09/12/2017 1047   LEUKOCYTESUR Negative 02/25/2018 1050   Sepsis Labs: @LABRCNTIP (procalcitonin:4,lacticacidven:4)  ) Recent Results (from the past  240 hour(s))  MRSA PCR Screening     Status: None   Collection Time: 06/06/18  5:47 PM  Result Value Ref Range Status   MRSA by PCR NEGATIVE NEGATIVE Final    Comment:        The GeneXpert MRSA Assay (FDA approved for NASAL specimens only), is one component of a comprehensive MRSA colonization surveillance program. It is not intended to diagnose MRSA infection nor to guide or monitor treatment for MRSA infections. Performed at [facility], [place] [address] [place], [place] [address]          Radiology Studies: Ct Angio Head W Or Wo Contrast  Result Date: 06/10/2018 CLINICAL DATA:  67 y/o F; status post NSTEMI and catheterization 06/07/2018. 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 Patient reports blacking out of vision in the lateral quadrant of the right eye. EXAM: CT ANGIOGRAPHY HEAD AND NECK TECHNIQUE: Multidetector CT imaging of the head and neck was performed using the standard protocol during bolus administration of intravenous contrast. Multiplanar CT image reconstructions and MIPs were obtained to evaluate  the vascular anatomy. Carotid stenosis measurements (when applicable) are obtained utilizing NASCET criteria, using the distal internal carotid diameter as the denominator. CONTRAST:  50 cc Isovue 370 COMPARISON:  06/08/2018 MRI of the head. FINDINGS: CT HEAD FINDINGS Brain: Faint lucency in the left occipital lobe corresponding to small infarct on prior MRI. No new stroke, hemorrhage, focal mass effect, extra-axial collection, hydrocephalus, or herniation. Few nonspecific hypodensities in white matter are compatible with mild chronic microvascular ischemic changes and there is mild volume loss of the brain. Vascular: As below. Skull: Normal. Negative for fracture or focal lesion. Sinuses: Imaged portions are clear. Orbits: No acute finding. Review of the MIP images confirms the above findings CTA NECK FINDINGS Aortic arch: Standard branching. Imaged  portion shows no evidence of aneurysm or dissection. No significant stenosis of the major arch vessel origins. Moderate aortic calcific atherosclerosis. Right carotid system: No evidence of dissection, stenosis (50% or greater) or occlusion. Calcified plaque of the carotid bifurcation with mild less than 50% proximal ICA stenosis. Left carotid system: No evidence of dissection, stenosis (50% or greater) or occlusion. Mixed plaque of the carotid bifurcation with mild less than 50% proximal ICA stenosis. Vertebral arteries: Codominant. No evidence of dissection, stenosis (50% or greater) or occlusion. Skeleton: Mild cervical spondylosis. No high-grade bony canal stenosis. Other neck: Right upper paratracheal lymphadenopathy measuring 18 x 15 mm (series 8, image 34). Upper chest: Negative. Review of the MIP images confirms the above findings CTA HEAD FINDINGS Anterior circulation: No significant stenosis, proximal occlusion, aneurysm, or vascular malformation. Calcified plaque of the carotid siphons with mild less less than 50% paraclinoid stenosis, greater on the left. Posterior circulation: No high-grade stenosis, proximal occlusion, aneurysm, or vascular malformation. Right vertebral artery calcified plaque with mild to moderate approximately 50% stenosis. Left vertebral artery calcified plaque with mild less than 50% stenosis. Venous sinuses: As permitted by contrast timing, patent. Anatomic variants: None significant. Delayed phase: No abnormal intracranial enhancement. Review of the MIP images confirms the above findings IMPRESSION: 1. Patent carotid and vertebral arteries. No dissection, aneurysm, or hemodynamically significant stenosis utilizing NASCET criteria. 2. Patent anterior and posterior intracranial circulation. No large vessel occlusion, aneurysm, or high-grade stenosis. 3. Atherosclerosis of the aorta, carotid bifurcations, carotid siphons, and bilateral intracranial vertebral arteries. 4. Small left  occipital lobe acute infarction better characterized on prior MRI. No new acute intracranial abnormality on noncontrast CT of head. No abnormal enhancement of the brain. 5. Right upper paratracheal lymphadenopathy of uncertain significance. 6. Mild cervical spine spondylosis. Electronically Signed   By: Kristine Garbe M.D.   On: 06/10/2018 01:19   Ct Angio Neck W Or Wo Contrast  Result Date: 06/10/2018 CLINICAL DATA:  67 y/o F; status post NSTEMI and catheterization 06/07/2018. 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 Patient reports blacking out of vision in the lateral quadrant of the right eye. EXAM: CT ANGIOGRAPHY HEAD AND NECK TECHNIQUE: Multidetector CT imaging of the head and neck was performed using the standard protocol during bolus administration of intravenous contrast. Multiplanar CT image reconstructions and MIPs were obtained to evaluate the vascular anatomy. Carotid stenosis measurements (when applicable) are obtained utilizing NASCET criteria, using the distal internal carotid diameter as the denominator. CONTRAST:  50 cc Isovue 370 COMPARISON:  06/08/2018 MRI of the head. FINDINGS: CT HEAD FINDINGS Brain: Faint lucency in the left occipital lobe corresponding to small infarct on prior MRI. No new stroke, hemorrhage, focal mass effect, extra-axial collection, hydrocephalus, or herniation. Few  nonspecific hypodensities in white matter are compatible with mild chronic microvascular ischemic changes and there is mild volume loss of the brain. Vascular: As below. Skull: Normal. Negative for fracture or focal lesion. Sinuses: Imaged portions are clear. Orbits: No acute finding. Review of the MIP images confirms the above findings CTA NECK FINDINGS Aortic arch: Standard branching. Imaged portion shows no evidence of aneurysm or dissection. No significant stenosis of the major arch vessel origins. Moderate aortic calcific atherosclerosis. Right carotid system: No evidence  of dissection, stenosis (50% or greater) or occlusion. Calcified plaque of the carotid bifurcation with mild less than 50% proximal ICA stenosis. Left carotid system: No evidence of dissection, stenosis (50% or greater) or occlusion. Mixed plaque of the carotid bifurcation with mild less than 50% proximal ICA stenosis. Vertebral arteries: Codominant. No evidence of dissection, stenosis (50% or greater) or occlusion. Skeleton: Mild cervical spondylosis. No high-grade bony canal stenosis. Other neck: Right upper paratracheal lymphadenopathy measuring 18 x 15 mm (series 8, image 34). Upper chest: Negative. Review of the MIP images confirms the above findings CTA HEAD FINDINGS Anterior circulation: No significant stenosis, proximal occlusion, aneurysm, or vascular malformation. Calcified plaque of the carotid siphons with mild less less than 50% paraclinoid stenosis, greater on the left. Posterior circulation: No high-grade stenosis, proximal occlusion, aneurysm, or vascular malformation. Right vertebral artery calcified plaque with mild to moderate approximately 50% stenosis. Left vertebral artery calcified plaque with mild less than 50% stenosis. Venous sinuses: As permitted by contrast timing, patent. Anatomic variants: None significant. Delayed phase: No abnormal intracranial enhancement. Review of the MIP images confirms the above findings IMPRESSION: 1. Patent carotid and vertebral arteries. No dissection, aneurysm, or hemodynamically significant stenosis utilizing NASCET criteria. 2. Patent anterior and posterior intracranial circulation. No large vessel occlusion, aneurysm, or high-grade stenosis. 3. Atherosclerosis of the aorta, carotid bifurcations, carotid siphons, and bilateral intracranial vertebral arteries. 4. Small left occipital lobe acute infarction better characterized on prior MRI. No new acute intracranial abnormality on noncontrast CT of head. No abnormal enhancement of the brain. 5. Right upper  paratracheal lymphadenopathy of uncertain significance. 6. Mild cervical spine spondylosis. Electronically Signed   By: Kristine Garbe M.D.   On: 06/10/2018 01:19        Scheduled Meds: . [MAR Hold] acyclovir  400 mg Oral Daily  . [MAR Hold] amLODipine  10 mg Oral Daily  . [MAR Hold] aspirin EC  81 mg Oral Daily  . [MAR Hold] clopidogrel  75 mg Oral Daily  . [MAR Hold] fluticasone  2 spray Each Nare Daily  . [MAR Hold] furosemide  20 mg Intravenous Once  . [MAR Hold] furosemide  20 mg Oral Daily  . [MAR Hold] hydrALAZINE  25 mg Oral BID  . [MAR Hold] HYDROcodone-acetaminophen  1 tablet Oral QHS  . [MAR Hold] insulin aspart  0-5 Units Subcutaneous QHS  . [MAR Hold] insulin aspart  0-9 Units Subcutaneous TID WC  . [MAR Hold] insulin aspart  15 Units Subcutaneous TID WC  . [MAR Hold] insulin glargine  25 Units Subcutaneous BID  . [MAR Hold] levothyroxine  100 mcg Oral QAC breakfast  . [MAR Hold] losartan  100 mg Oral Daily  . [MAR Hold] montelukast  10 mg Oral QHS  . [MAR Hold] pantoprazole  80 mg Oral Daily  . [MAR Hold] rosuvastatin  40 mg Oral QHS  . [MAR Hold] sodium chloride flush  3 mL Intravenous Q12H  . sodium chloride flush  3 mL Intravenous Q12H  Continuous Infusions: . [MAR Hold] sodium chloride    . sodium chloride    . bivalirudin (ANGIOMAX) infusion 5 mg/mL 1.75 mg/kg/hr (06/11/18 1516)  . heparin 1,800 Units/hr (06/11/18 0500)  . [MAR Hold] nitroGLYCERIN Stopped (06/08/18 0919)     LOS: 5 days    Time spent: 35 minutes    Edwin Dada, MD [facility] 06/11/2018, 4:17 PM     Pager [phone] --- please page though AMION:  [email or web address] If 7PM-7AM, please contact night-coverage

## 2018-06-11 NOTE — Progress Notes (Signed)
[place] for heparin Indication: ACS/STEMI  Allergies  Allergen Reactions  . Atorvastatin Other (See Comments)    Leg weakness.    Patient Measurements: Height: 5\' 5"  (165.1 cm) Weight: 269 lb 14.4 oz (122.4 kg) IBW/kg (Calculated) : 57 Heparin Dosing Weight: 88 kg  Vital Signs: Temp: 98.1 F (36.7 C) (09/25 0400) Temp Source: Oral (09/25 0400) BP: 185/78 (09/25 0500) Pulse Rate: 51 (09/25 0500)  Labs: Recent Labs    06/09/18 9735 06/09/18 2036 06/10/18 0321 06/10/18 0634 06/11/18 0237  HGB 10.5*  --   --  9.4* 9.5*  HCT 35.3*  --   --  32.0* 32.2*  PLT 215  --   --  191 183  HEPARINUNFRC  --  0.35 0.38  --  0.37    Estimated Creatinine Clearance: 73.4 mL/min (by C-G formula based on SCr of 0.99 mg/dL).   Medical History: Past Medical History:  Diagnosis Date  . Allergy   . Anxiety   . Asthma   . Diabetes mellitus without complication ([place])   . Gout   . Hyperlipidemia   . Hypertension   . Hypothyroid   . Neuropathy   . Obesity   . Vitamin D deficiency    Assessment: Pharmacy consulted to dose heparin in patient for ACS/STEMI.  She is now s/p cath which showed severe 3 vessel disease.  She has been turned down for cardiac surgery because of poor functional capacity, multiple comorbid medical problems, and new acute stroke this admission. Pharmacy consulted to manage heparin post-cath.   Heparin level this morning came back at 0.37, on 1800 units/hr. Hgb 9.5, plt 183. No s/sx of bleeding or infusion issues noted per RN. Will continue reduced heparin goal of 0.3-0.5 with risk of hemorrhagic conversion given recent CVA.  Goal of Therapy:  Heparin level 0.3-0.5 units/ml Monitor platelets by anticoagulation protocol: Yes   Plan:  Continue heparin infusion at 1800 units/hr Monitor daily heparin level, CBC, s/sx of bleeding F/u plans for intervention on 9/25  Thank you for involving pharmacy in this patient's  care.  Doylene Canard, PharmD Clinical Pharmacist  Pager: [phone] Phone: [phone] 06/11/2018 7:19 AM`

## 2018-06-11 NOTE — Interval H&P Note (Signed)
History and Physical Interval Note:  06/11/2018 2:24 PM  Mary Raymond  has presented today for surgery, with the diagnosis of cad  The various methods of treatment have been discussed with the patient and family. After consideration of risks, benefits and other options for treatment, the patient has consented to  Procedure(s): CORONARY ATHERECTOMY (N/A) as a surgical intervention .  The patient's history has been reviewed, patient examined, no change in status, stable for surgery.  I have reviewed the patient's chart and labs.  Questions were answered to the patient's satisfaction.   Cath Lab Visit (complete for each Cath Lab visit)  Clinical Evaluation Leading to the Procedure:   ACS: Yes.    Non-ACS:    Anginal Classification: CCS IV  Anti-ischemic medical therapy: Maximal Therapy (2 or more classes of medications)  Non-Invasive Test Results: No non-invasive testing performed  Prior CABG: No previous CABG        [name] [facility] 06/11/2018 2:24 PM

## 2018-06-11 NOTE — Care Management Important Message (Signed)
Important Message  Patient Details  Name: Mary Raymond MRN: [id number] Date of Birth: May 12, 1951   Medicare Important Message Given:  Yes    Coralyn Roselli P Aubreanna Percle 06/11/2018, 1:37 PM

## 2018-06-11 NOTE — Progress Notes (Signed)
CSW confirmed [place] has started [employer] authorization. SNF requesting updated PT notes to send to [facility]; CSW did send PT eval to SNF and awaiting PT updates today. CSW to follow and support with disposition planning.  Mary Raymond, [place]

## 2018-06-12 ENCOUNTER — Encounter (HOSPITAL_COMMUNITY): Payer: Self-pay | Admitting: Cardiology

## 2018-06-12 LAB — BASIC METABOLIC PANEL
ANION GAP: 7 (ref 5–15)
BUN: 20 mg/dL (ref 8–23)
CALCIUM: 9.1 mg/dL (ref 8.9–10.3)
CO2: 28 mmol/L (ref 22–32)
Chloride: 106 mmol/L (ref 98–111)
Creatinine, Ser: 1.27 mg/dL — ABNORMAL HIGH (ref 0.44–1.00)
GFR calc Af Amer: 50 mL/min — ABNORMAL LOW (ref >60)
GFR, EST NON AFRICAN AMERICAN: 43 mL/min — AB (ref >60)
GLUCOSE: 129 mg/dL — AB (ref 70–99)
POTASSIUM: 4.4 mmol/L (ref 3.5–5.1)
Sodium: 141 mmol/L (ref 135–145)

## 2018-06-12 LAB — CBC
HCT: 35.4 % — ABNORMAL LOW (ref 36.0–46.0)
Hemoglobin: 10.5 g/dL — ABNORMAL LOW (ref 12.0–15.0)
MCH: 24.5 pg — ABNORMAL LOW (ref 26.0–34.0)
MCHC: 29.7 g/dL — AB (ref 30.0–36.0)
MCV: 82.7 fL (ref 78.0–100.0)
Platelets: 222 10*3/uL (ref 150–400)
RBC: 4.28 MIL/uL (ref 3.87–5.11)
RDW: 16.9 % — AB (ref 11.5–15.5)
WBC: 10.5 10*3/uL (ref 4.0–10.5)

## 2018-06-12 LAB — GLUCOSE, CAPILLARY
GLUCOSE-CAPILLARY: 231 mg/dL — AB (ref 70–99)
Glucose-Capillary: 118 mg/dL — ABNORMAL HIGH (ref 70–99)
Glucose-Capillary: 126 mg/dL — ABNORMAL HIGH (ref 70–99)
Glucose-Capillary: 283 mg/dL — ABNORMAL HIGH (ref 70–99)
Glucose-Capillary: 292 mg/dL — ABNORMAL HIGH (ref 70–99)

## 2018-06-12 MED ORDER — ORAL CARE MOUTH RINSE
15.0000 mL | Freq: Two times a day (BID) | OROMUCOSAL | Status: DC
  Administered 2018-06-12 – 2018-06-19 (×11): 15 mL via OROMUCOSAL

## 2018-06-12 MED ORDER — INSULIN ASPART 100 UNIT/ML ~~LOC~~ SOLN
15.0000 [IU] | Freq: Three times a day (TID) | SUBCUTANEOUS | Status: DC
  Administered 2018-06-12 – 2018-06-19 (×21): 15 [IU] via SUBCUTANEOUS

## 2018-06-12 NOTE — Progress Notes (Signed)
Progress Note  Patient Name: QUANASIA DEFINO Date of Encounter: 06/12/2018  Primary Cardiologist: Carlyle Dolly, MD   Subjective   The patient is feeling much better today.  She denies chest pain.  States her breathing is much better.  Her visual field deficit is improving.  She has no complaints this morning other than chronic low back pain from lying in bed.  Inpatient Medications    Scheduled Meds: . acyclovir  400 mg Oral Daily  . amLODipine  10 mg Oral Daily  . aspirin EC  81 mg Oral Daily  . clopidogrel  75 mg Oral Daily  . enoxaparin (LOVENOX) injection  40 mg Subcutaneous Q24H  . fluticasone  2 spray Each Nare Daily  . furosemide  20 mg Oral Daily  . hydrALAZINE  25 mg Oral BID  . HYDROcodone-acetaminophen  1 tablet Oral QHS  . insulin aspart  0-5 Units Subcutaneous QHS  . insulin aspart  0-9 Units Subcutaneous TID WC  . insulin aspart  15 Units Subcutaneous TID WC  . insulin glargine  25 Units Subcutaneous BID  . levothyroxine  100 mcg Oral QAC breakfast  . losartan  100 mg Oral Daily  . montelukast  10 mg Oral QHS  . pantoprazole  80 mg Oral Daily  . rosuvastatin  40 mg Oral QHS  . sodium chloride flush  3 mL Intravenous Q12H  . sodium chloride flush  3 mL Intravenous Q12H   Continuous Infusions: . sodium chloride    . sodium chloride     PRN Meds: sodium chloride, sodium chloride, acetaminophen, albuterol, ALPRAZolam, gi cocktail, ipratropium-albuterol, LORazepam, morphine injection, ondansetron (ZOFRAN) IV, sodium chloride flush, sodium chloride flush   Vital Signs    Vitals:   06/12/18 0400 06/12/18 0500 06/12/18 0600 06/12/18 0700  BP: (!) 143/49 (!) 137/51 (!) 151/55 (!) 151/55  Pulse: 70 (!) 38 (!) 30 (!) 57  Resp: (!) 23 (!) 21 (!) 32 (!) 23  Temp: 98 F (36.7 C)     TempSrc: Oral     SpO2: 95% 98% 96% 96%  Weight:      Height:        Intake/Output Summary (Last 24 hours) at 06/12/2018 0804 Last data filed at 06/12/2018 0400 Gross per  24 hour  Intake 1128.12 ml  Output 3850 ml  Net -2721.88 ml   Filed Weights   06/05/18 1454 06/05/18 1835 06/06/18 1513  Weight: 126.7 kg 125.6 kg 122.4 kg    Telemetry    Sinus rhythm with second-degree Mobitz 1 AV block and periods of 2-1 conduction - Personally Reviewed  ECG    Sinus rhythm with second-degree type I AV block, no atrial fibrillation - Personally Reviewed  Physical Exam  Alert, oriented, obese woman in NAD GEN: No acute distress.   Neck: No JVD Cardiac: RRR, 2/6 systolic murmur at the apex  Respiratory: Clear to auscultation bilaterally. GI: Soft, nontender, non-distended  MS: No edema; No deformity. Right groin site clear Neuro:  Nonfocal  Psych: Normal affect   Labs    Chemistry Recent Labs  Lab 06/07/18 0311 06/11/18 0746 06/12/18 0526  NA 138 140 141  K 4.8 4.9 4.4  CL 105 102 106  CO2 '24 24 28  '$ GLUCOSE 260* 176* 129*  BUN 16 24* 20  CREATININE 0.99 1.26* 1.27*  CALCIUM 8.7* 9.4 9.1  GFRNONAA 58* 43* 43*  GFRAA >60 50* 50*  ANIONGAP '9 14 7     '$ Hematology Recent Labs  Lab 06/10/18 0634 06/11/18 0237 06/12/18 0526  WBC 10.5 10.1 10.5  RBC 3.84* 3.89 4.28  HGB 9.4* 9.5* 10.5*  HCT 32.0* 32.2* 35.4*  MCV 83.3 82.8 82.7  MCH 24.5* 24.4* 24.5*  MCHC 29.4* 29.5* 29.7*  RDW 16.6* 16.6* 16.9*  PLT 191 183 222    Cardiac Enzymes Recent Labs  Lab 06/05/18 1947 06/05/18 2304 06/06/18 0157  TROPONINI 0.25* 0.34* 0.35*    Recent Labs  Lab 06/05/18 1505  TROPIPOC 0.11*     BNP Recent Labs  Lab 06/05/18 1526  BNP 197.0*     DDimer No results for input(s): DDIMER in the last 168 hours.   Radiology    No results found.  Cardiac Studies   Cath/PCI: Conclusion     Prox RCA to Mid RCA lesion is 95% stenosed.  A drug-eluting stent was successfully placed using a STENT SYNERGY DES 4X20.  Post intervention, there is a 0% residual stenosis.  Dist RCA lesion is 65% stenosed.  Mid RCA lesion is 80%  stenosed.  Post intervention, there is a 0% residual stenosis.  A drug-eluting stent was successfully placed using a STENT SYNERGY DES 3.5X20.  Acute Mrg lesion is 40% stenosed.   1. Successful PCI of the proximal and mid RCA with orbital atherectomy and stenting with DES x 2  Plan: observe in the unit tonight due to groin bleeding.   Recommend uninterrupted dual antiplatelet therapy with Aspirin '81mg'$  daily and Clopidogrel '75mg'$  daily for a minimum of 12 months (ACS - Class I recommendation).  Indications   Non-ST elevation (NSTEMI) myocardial infarction (HCC) [I21.4 (ICD-10-CM)]  Procedural Details/Technique   Technical Details Indication: 67 yo WF presented with a NSTEMI. Cardiac cath showed severe CAD. Due to very poor functional status and recent CVA she was turned down for CABG. We recommended PCI of the 2 critical RCA stenoses with atherectomy and stenting.  Procedural Details: The right groin was prepped, draped, and anesthetized with 1% lidocaine. Ultrasound was used to guide access. Image was obtained and stored in the record. Using the modified Seldinger technique, a 6 Fr long sheath was introduced into the right femoral artery. A 6 Fr sheath was placed in the right femoral vein. A temporary transvenous pacemaker was placed in the RV apex under fluoro and good capture threshold obtained. Weight-based bivalirudin was given for anticoagulation. Once a therapeutic ACT was achieved, a 6 [place] LA 1 guide catheter was inserted. A Fighter coronary guidewire was used to cross the lesions. Using a Teleport catheter this wire was exchanged for a Viper wire. IC Ntg was given. The lesions were treated with orbital atherectomy using a Diamondback catheter. The mid vessel lesion was treated with 2 passes and 80 kpm. The proximal lesion was treated with 3 passes at 80 kpm and then 3 passes at 120 kpm. The mid lesion was predilated with a 3.0 mm balloon with incomplete expansion so we dilated with a  3.0 mm [place] balloon with good expansion. The proximal lesion was also treated with a 3.0 mm balloon. The mid vessel lesion was then stented with a 3.5 x 20 mm Synergy stent. The stent was postdilated with a 3.75 mm noncompliant balloon. The proximal lesion was stented with a 4.0 x 20 mm Synergy stent and post dilated with a 4.0 mm [place] balloon. Following PCI, there was 0% residual stenosis and TIMI-3 flow. Final angiography confirmed an excellent result. The patient tolerated the procedure well. There were no immediate procedural complications. An Angioseal  device was placed initially for femoral hemostasis. This did not yield good hemostasis but with manual compression for another 20 minutes hemostasis was obtained. The patient was transferred to the post catheterization recovery area for further monitoring.  Contrast: 145 cc     Estimated blood loss <50 mL.  During this procedure the patient was administered the following to achieve and maintain moderate conscious sedation: Versed 4 mg, Fentanyl 150 mcg, while the patient's heart rate, blood pressure, and oxygen saturation were continuously monitored. The period of conscious sedation was 127 minutes, of which I was present face-to-face 100% of this time.  Complications   Complications documented before study signed (06/11/2018 5:26 PM EDT)      Log Level Complications   Bleeding at access site Documented by [place], Peter M, MD 06/11/2018 4:45 PM EDT  Outcome: Resolved in Lab  Time Range: Intraprocedure    Coronary Findings   Diagnostic  Dominance: Right  Left Main  Mid LM lesion 55% stenosed  Mid LM lesion is 55% stenosed.  Left Anterior Descending  Ost LAD to Prox LAD lesion 50% stenosed  Ost LAD to Prox LAD lesion is 50% stenosed.  Left Circumflex  Ost Cx lesion 90% stenosed  Ost Cx lesion is 90% stenosed.  Prox Cx lesion 85% stenosed  Prox Cx lesion is 85% stenosed.  Right Coronary Artery  Prox RCA to Mid RCA lesion 95% stenosed   Prox RCA to Mid RCA lesion is 95% stenosed. The lesion is severely calcified.  Mid RCA lesion 80% stenosed  Mid RCA lesion is 80% stenosed. The lesion is moderately calcified.  Dist RCA lesion 65% stenosed  Dist RCA lesion is 65% stenosed.  Acute Marginal Branch  Acute Mrg lesion 40% stenosed  Acute Mrg lesion is 40% stenosed.  Intervention   Prox RCA to Mid RCA lesion  Stent  CATH [place]ISTA GUIDE 6FR AL1 guide catheter was inserted. Lesion crossed with guidewire using a WIRE FIGHTER CROSSING. Pre-stent angioplasty was performed using a BALLOON SAPPHIRE 3.0X15. A drug-eluting stent was successfully placed using a STENT SYNERGY DES 4X20. Stent does not overlap previously placed stentPost-stent angioplasty was performed using a BALLOON SAPPHIRE [place] 4.0X15. Maximum pressure: 16 atm.  Atherectomy  CATH VISTA GUIDE 6FR AL1 guide catheter was inserted. WIRE VIPER ADVANCE COR .012TIP guidewire was used to cross lesion. Orbital atherectomy was performed using a CROWN DIAMONDBACK CLASSIC 1.25. 6 passes taken.  Post-Intervention Lesion Assessment  The intervention was successful. Pre-interventional TIMI flow is 3. Post-intervention TIMI flow is 3. No complications occurred at this lesion.  There is a 0% residual stenosis post intervention.  Mid RCA lesion  Stent  CATH VISTA GUIDE 6FR AL1 guide catheter was inserted. Lesion crossed with guidewire using a WIRE FIGHTER CROSSING. Pre-stent angioplasty was performed using a BALLOON SAPPHIRE [place] 3.0X12. A drug-eluting stent was successfully placed using a STENT SYNERGY DES 3.5X20. Post-stent angioplasty was performed using a BALLOON SAPPHIRE [place] [id number]. Maximum pressure: 18 atm.  Atherectomy  WIRE VIPER ADVANCE COR .012TIP guidewire was used to cross lesion. Orbital atherectomy was performed using a CROWN DIAMONDBACK CLASSIC 1.25. 2 passes taken.  Post-Intervention Lesion Assessment  The intervention was successful. Pre-interventional TIMI flow is 3.  Post-intervention TIMI flow is 3. No complications occurred at this lesion.  There is a 0% residual stenosis post intervention.  Coronary Diagrams   Diagnostic Diagram       Post-Intervention Diagram          Patient Profile  67 y.o. female with past medical history of HTN, HLD, and type II DM who presented to [facility], ED on 06/05/2018 for evaluation of chest pain. Troponin value elevated to 0.34. Underwent cath with multivessel disease. Felt not to be a candidate for CABG and planned for PCI.   Assessment & Plan    1.  Non-STEMI: The patient underwent successful complex PCI of the right coronary artery yesterday.  Her residual severe stenosis in the left circumflex will be treated medically as she has extremely complex disease extending back to the left main it is unfavorable for PCI.  She should continue on aspirin and clopidogrel.  2.  Second-degree AV block Mobitz 1 with periods of marked bradycardia: Not a candidate for beta-blockade.  Appears asymptomatic.  3.  Type 2 diabetes: Management per the [facility] team.  4.  Hyperlipidemia: Patient is treated with rosuvastatin 40 mg daily.  5.  Stroke: Neurology following.  Continue antiplatelet therapy, high intensity statin drug, and aggressive medical therapy.  Dispo: from cardiac standpoint pt stable to tx to a tele bed today and work towards disposition.     For questions or updates, please contact [place] Please consult [email or web address] for contact info under        Signed, Sherren Mocha, MD  06/12/2018, 8:04 AM

## 2018-06-12 NOTE — Progress Notes (Addendum)
4:29 pm CSW met with patient at bedside and updated patient on insurance authorization for SNF. CSW also called patient's daughter, Mendel Ryder, and gave update. Patient will need PTAR to SNF when ready for discharge. CSW to follow for medical readiness and will support.  11:14 am Patient's facility, [facility], has received [facility] authorization for patient, valid for 48 hours from this morning. CSW to follow for patient's medical readiness.  [name], [place]

## 2018-06-12 NOTE — Progress Notes (Signed)
Occupational Therapy Treatment Patient Details Name: Mary Raymond MRN: [id number] DOB: 10-10-1950 Today's Date: 06/12/2018    History of present illness 67 y.o. female admitted to [facility] 9/19 with NSTEMI with heart cath on 9/20, and pt with right visual loss (9/22) and MRI showing small subcentimeter left occipital infarcts. PMHx:  HTN, HLD, type II DM, obesity, gout   OT comments  Pt aware of her cognitive deficits stating, "I feel like I'm in a fog. Sometimes I get it and sometimes I don't." Pt performing UB dressing with min assist, ambulated to bathroom and performed standing grooming with min guard assist. Pt needing assist for pericare following BM. Improvement noted in functional vision at sink and when negotiating around objects with RW. Updated d/c disposition to SNF.  Follow Up Recommendations  Supervision/Assistance - 24 hour;SNF    Equipment Recommendations  None recommended by OT(defer to next venue)    Recommendations for Other Services      Precautions / Restrictions Precautions Precautions: Fall Precaution Comments: R visual field cut       Mobility Bed Mobility               General bed mobility comments: pt receive in chair  Transfers Overall transfer level: Needs assistance Equipment used: Rolling walker (2 wheeled) Transfers: Sit to/from Stand Sit to Stand: Min guard         General transfer comment: increased time and effort    Balance Overall balance assessment: Needs assistance   Sitting balance-Leahy Scale: Fair     Standing balance support: Bilateral upper extremity supported;During functional activity Standing balance-Leahy Scale: Fair Standing balance comment: can release walker in static standing without LOB                           ADL either performed or assessed with clinical judgement   ADL Overall ADL's : Needs assistance/impaired     Grooming: Wash/dry hands;Standing;Min guard Grooming Details  (indicate cue type and reason): locating soap in R visual field         Upper Body Dressing : Minimal assistance;Sitting Upper Body Dressing Details (indicate cue type and reason): front opening gown     Toilet Transfer: Min guard;RW;Ambulation;Comfort height toilet   Toileting- Clothing Manipulation and Hygiene: Maximal assistance;Sit to/from stand Toileting - Clothing Manipulation Details (indicate cue type and reason): for pericare after BM, cues to locate toilet paper on R side     Functional mobility during ADLs: Min guard;Rolling walker(negotiated around end of bed with RW)       Vision   Additional Comments: reports blurred vision, but typically uses reading glasses, R field cut improved   Perception     Praxis      Cognition Arousal/Alertness: Awake/alert Behavior During Therapy: WFL for tasks assessed/performed Overall Cognitive Status: Impaired/Different from baseline Area of Impairment: Memory;Following commands;Safety/judgement;Problem solving;Attention                   Current Attention Level: Sustained Memory: Decreased short-term memory Following Commands: Follows one step commands with increased time Safety/Judgement: Decreased awareness of safety;Decreased awareness of deficits   Problem Solving: Slow processing;Requires verbal cues General Comments: pt losing train of thought during conversation        Exercises     Shoulder Instructions       General Comments      Pertinent Vitals/ Pain       Pain Assessment: No/denies pain  Home  Living                                          Prior Functioning/Environment              Frequency  Min 2X/week        Progress Toward Goals  OT Goals(current goals can now be found in the care plan section)  Progress towards OT goals: Progressing toward goals  Acute Rehab OT Goals Patient Stated Goal: "get my head clearer" OT Goal Formulation: With patient Time For  Goal Achievement: 06/23/18 Potential to Achieve Goals: Good  Plan Discharge plan needs to be updated;Frequency needs to be updated    Co-evaluation                 AM-PAC PT "6 Clicks" Daily Activity     Outcome Measure   Help from another person eating meals?: None Help from another person taking care of personal grooming?: A Little Help from another person toileting, which includes using toliet, bedpan, or urinal?: A Lot Help from another person bathing (including washing, rinsing, drying)?: A Lot Help from another person to put on and taking off regular upper body clothing?: A Little Help from another person to put on and taking off regular lower body clothing?: A Lot 6 Click Score: 16    End of Session Equipment Utilized During Treatment: Rolling walker;Gait belt  OT Visit Diagnosis: Unsteadiness on feet (R26.81);Other abnormalities of gait and mobility (R26.89);Muscle weakness (generalized) (M62.81);Other symptoms and signs involving cognitive function;Low vision, both eyes (H54.2)   Activity Tolerance Patient tolerated treatment well   Patient Left in chair;with call bell/phone within reach   Nurse Communication          Time: 8756-4332 OT Time Calculation (min): 21 min  Charges: OT General Charges $OT Visit: 1 Visit OT Treatments $Self Care/Home Management : 8-22 mins   Malka So 06/12/2018, 3:03 PM  Nestor Lewandowsky, OTR/L Acute Rehabilitation Services Pager: [phone] Office: [phone]

## 2018-06-12 NOTE — Plan of Care (Signed)

## 2018-06-12 NOTE — Progress Notes (Signed)
PROGRESS NOTE    Mary Raymond  GHW:[id number] DOB: 08/06/1951 DOA: 06/05/2018 [name], FNP      Brief Narrative:  Mary Raymond is a 67 y.o. F with fibromyalgia, morbid obesity, HTN, DM who prsetned with chest pain, found in the ER to have elevated troponin.       Assessment & Plan:  NSTEMI LHC showed 3 vessel disease in diabetic.  Evaluated by CT surgery, who recommended not to do coronary bypass grafting due to extensive comorbidities.  To cath today for possible stenting by Cardiology -Continue aspirin, Plavix -Continue Crestor 40 -Continue max-dose Losartan -Beta-blocker contraindicated with sec deg HB -EF assessed, 60-65%   LEFT Occipital lobe CVA -Non-invasive angiography showed vertebral artery, aortic atherosclerosis, no significant carotid stenosis -Echocardiogram showed no cardiogenic source of embolism   -Lipids ordered: continue Crestor -Aspirin ordered at admission  -tPA not given because no LVO present -PT eval ordered: recommended SNF -Smoking cessation: not pertintent -Atrial fibrillation: not present, although Neurology recommend 30 day event monitor  Hypertension -Continue amlodipine, losartan, Lasix, hydralazine  Diabetes Glucoses reasonable control -Continue glargine -Restart mealtime insulin -Continue SSI  Other medications -Continue acyclovir -Continue norco  PRN -Continue Singulair -Continue pantoprazole  Hypothyroidism -Continue lveothyroxine  ELevated creatinine Baseline 0.9, stable at 1.2 today -Daily BMP  Anemia, presumed chronic disease No change today, minimal bleednig from groin last night after procedure, none today.  Second degree AV block, type 1 Avoid nodal blocking agents.      DVT prophylaxis: Lovenox Code Status: FULL Family Communication: None present MDM and disposition Plan: The below labs and imaging reports were reviewed and summarized above.  Medication management as above.  The  patient was admitted with NSTEMI, found to have small stroke.  Stroke evaluation is complete, she will need 30 day monitor at discharge and follow up with Neurology.  With regard to NSTEMI, she will need Cardiology follow up, hydralazine in place of metoprolol, increased Crestor dose and new aspirin Brilinta at discharge.  PT recommend SNF.  Transfer to Tele today.  If stable BP and no new ectopy, chest pain, to SNF tomorrow morning.      Consultants:   Cardiology  CT surgery  Neurology  Procedures:   Echocardiogram 9/20 LV EF: 60% -   65%  ------------------------------------------------------------------- Indications:      Chest pain 786.51.  ------------------------------------------------------------------- History:   PMH:  Elevated Troponin.  Risk factors:  Hypertension. Dyslipidemia.  ------------------------------------------------------------------- Study Conclusions  - Left ventricle: The cavity size was normal. Wall thickness was   increased in a pattern of moderate LVH. Systolic function was   normal. The estimated ejection fraction was in the range of 60%   to 65%. Wall motion was normal; there were no regional wall   motion abnormalities. Indeterminate diastolic function. - Aortic valve: Moderately calcified annulus. Trileaflet; mildly   calcified leaflets. - Mitral valve: Moderately calcified annulus. There was mild   regurgitation. - Left atrium: The atrium was mildly dilated. - Right atrium: Central venous pressure (est): 15 mm Hg. - Atrial septum: No defect or patent foramen ovale was identified. - Tricuspid valve: There was trivial regurgitation. - Pulmonary arteries: Systolic pressure could not be accurately   estimated. - Pericardium, extracardiac: A prominent pericardial fat pad was   present.   Left heart catheterization 9/20  Prox RCA to Mid RCA lesion is 90% stenosed.  Dist RCA lesion is 80% stenosed.  Mid RCA lesion is 80%  stenosed.  Acute Mrg lesion  is 40% stenosed.  Mid LM lesion is 55% stenosed.  Ost Cx lesion is 90% stenosed.  Prox Cx lesion is 85% stenosed.  Ost LAD to Prox LAD lesion is 50% stenosed.   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 There is  55% focal distal left main stenosis, calcification of the proximal LAD with 50% stenosis, 90% ostial left circumflex stenosis followed by 85% calcified proximal stenosis; and a large dominant RCA with severe eccentric calcification with 90% stenosis proximally, 80% stenosis in the region of the crux and 80% stenosis prior to the PDA takeoff with 40% marginal/PDA branch stenosis.  Preserved global LV contractility with inferior hypocontractility and ejection fraction of approximately 55%.  LVEDP 17 mm Hg.    RECOMMENDATION: Surgical consultation for CABG revascularization.  The patient will be started on heparin therapy 8 hours post sheath removal.  She will also be started on IV nitroglycerin.  I have discussed the catheterization findings with Dr. Servando Snare.  She has been documented to have intermittent second-degree type I block. She will transported to [facility] for further evaluation and treatment prior to subsequent CABG revascularization. Recommend Aspirin 81mg  daily and will not start DAPT  due to need for surgery.   Carotid US and bilateral ABI 9/22 Final Interpretation: Right Carotid: Velocities in the right ICA are consistent with a 1-39% stenosis.  Left Carotid: Velocities in the left ICA are consistent with a 1-39% stenosis.  Right ABI: Resting right ankle-brachial index is within normal range. No evidence of significant right lower extremity arterial disease. Left ABI: Resting left ankle-brachial index is within normal range. No evidence of significant left lower extremity arterial disease.  Right Upper Extremity: Allen's test not performed due to TR band. Left Upper Extremity: Doppler waveform obliterate with left  radial compression. Doppler waveforms remain within normal limits with left ulnar compression.    Left heart catheterization 06/11/2018   Antimicrobials:   None    Subjective: No chest pain.  Dyspnea, present, mild.  No groin bleeding, no leg swelling, no abdominal pain, fever, cough, sputum.  Vision field deficit still present, no other focal neurological deficits.         Objective: Vitals:   06/12/18 1215 06/12/18 1218 06/12/18 1300 06/12/18 1343  BP:  (!) 147/53  138/62  Pulse:  60  60  Resp:  (!) 22 (!) 22   Temp: 98.2 F (36.8 C)   97.8 F (36.6 C)  TempSrc: Oral   Oral  SpO2:  94%  95%  Weight:      Height:        Intake/Output Summary (Last 24 hours) at 06/12/2018 1406 Last data filed at 06/12/2018 0800 Gross per 24 hour  Intake 1128.12 ml  Output 2500 ml  Net -1371.88 ml   Filed Weights   06/05/18 1454 06/05/18 1835 06/06/18 1513  Weight: 126.7 kg 125.6 kg 122.4 kg    Examination: General appearance: obese adult female, lying in bed, interactive   HEENT: Anicteric, conjunctiva pink, lids and lashes normal. No nasal deformity, discharge, epistaxis.  Lips moist, dentition normal, no oral lesions, OP moist, hearing normal.   Skin: Warm and dry.  no jaundice.  No suspicious rashes or lesions. Cardiac: RRR, soft systolic murmur, JVP not visible, no LE edema.    Respiratory: Normal RR, non-labored, no rales or wheezes, diminihsed thoguhtout due to habitus Abdomen: Abdomen soft without focal tenderness to palpatioin, distension. MSK: No deformities or effusions. Neuro: Awake and alert.  Visual field defect  in right eye, otherwise, CNs normal, EOMI, moves all extremities with equal strength and coordination. Speech fluent.    Psych: Sensorium intact and responding to questions, attention normal, judgment appears normal.   Data Reviewed: I have personally reviewed following labs and imaging studies:  CBC: Recent Labs  Lab 06/08/18 0215 06/09/18 0923  06/10/18 0634 06/11/18 0237 06/12/18 0526  WBC 10.5 10.5 10.5 10.1 10.5  HGB 9.6* 10.5* 9.4* 9.5* 10.5*  HCT 33.0* 35.3* 32.0* 32.2* 35.4*  MCV 83.3 83.5 83.3 82.8 82.7  PLT 206 215 191 183 098   Basic Metabolic Panel: Recent Labs  Lab 06/05/18 1456 06/06/18 0157 06/07/18 0311 06/11/18 0746 06/12/18 0526  NA 139 140 138 140 141  K 4.0 4.3 4.8 4.9 4.4  CL 106 106 105 102 106  CO2 25 26 24 24 28   GLUCOSE 70 126* 260* 176* 129*  BUN 20 21 16  24* 20  CREATININE 0.97 1.04* 0.99 1.26* 1.27*  CALCIUM 9.1 8.6* 8.7* 9.4 9.1   GFR: Estimated Creatinine Clearance: 57.2 mL/min (A) (by C-G formula based on SCr of 1.27 mg/dL (H)). Liver Function Tests: No results for input(s): AST, ALT, ALKPHOS, BILITOT, PROT, ALBUMIN in the last 168 hours. No results for input(s): LIPASE, AMYLASE in the last 168 hours. No results for input(s): AMMONIA in the last 168 hours. Coagulation Profile: No results for input(s): INR, PROTIME in the last 168 hours. Cardiac Enzymes: Recent Labs  Lab 06/05/18 1947 06/05/18 2304 06/06/18 0157  TROPONINI 0.25* 0.34* 0.35*   BNP (last 3 results) No results for input(s): PROBNP in the last 8760 hours. HbA1C: No results for input(s): HGBA1C in the last 72 hours. CBG: Recent Labs  Lab 06/11/18 1127 06/11/18 2118 06/12/18 0657 06/12/18 0757 06/12/18 1202  GLUCAP 143* 156* 118* 126* 283*   Lipid Profile: No results for input(s): CHOL, HDL, LDLCALC, TRIG, CHOLHDL, LDLDIRECT in the last 72 hours. Thyroid Function Tests: No results for input(s): TSH, T4TOTAL, FREET4, T3FREE, THYROIDAB in the last 72 hours. Anemia Panel: No results for input(s): VITAMINB12, FOLATE, FERRITIN, TIBC, IRON, RETICCTPCT in the last 72 hours. Urine analysis:    Component Value Date/Time   COLORURINE YELLOW 09/12/2017 1047   APPEARANCEUR Clear 02/25/2018 1050   LABSPEC 1.017 09/12/2017 1047   PHURINE 5.0 09/12/2017 1047   GLUCOSEU Negative 02/25/2018 1050   HGBUR SMALL (A)  09/12/2017 1047   BILIRUBINUR Negative 02/25/2018 1050   KETONESUR NEGATIVE 09/12/2017 1047   PROTEINUR 3+ (A) 02/25/2018 1050   PROTEINUR >=300 (A) 09/12/2017 1047   UROBILINOGEN negative 11/14/2015 0949   NITRITE Negative 02/25/2018 1050   NITRITE NEGATIVE 09/12/2017 1047   LEUKOCYTESUR Negative 02/25/2018 1050   Sepsis Labs: @LABRCNTIP (procalcitonin:4,lacticacidven:4)  ) Recent Results (from the past 240 hour(s))  MRSA PCR Screening     Status: None   Collection Time: 06/06/18  5:47 PM  Result Value Ref Range Status   MRSA by PCR NEGATIVE NEGATIVE Final    Comment:        The GeneXpert MRSA Assay (FDA approved for NASAL specimens only), is one component of a comprehensive MRSA colonization surveillance program. It is not intended to diagnose MRSA infection nor to guide or monitor treatment for MRSA infections. Performed at [facility], [place] [address] [place], [place] [address]          Radiology Studies: No results found.      Scheduled Meds: . acyclovir  400 mg Oral Daily  . amLODipine  10 mg Oral  Daily  . aspirin EC  81 mg Oral Daily  . clopidogrel  75 mg Oral Daily  . enoxaparin (LOVENOX) injection  40 mg Subcutaneous Q24H  . fluticasone  2 spray Each Nare Daily  . furosemide  20 mg Oral Daily  . hydrALAZINE  25 mg Oral BID  . HYDROcodone-acetaminophen  1 tablet Oral QHS  . insulin aspart  0-5 Units Subcutaneous QHS  . insulin aspart  0-9 Units Subcutaneous TID WC  . insulin aspart  15 Units Subcutaneous TID WC  . insulin glargine  25 Units Subcutaneous BID  . levothyroxine  100 mcg Oral QAC breakfast  . losartan  100 mg Oral Daily  . mouth rinse  15 mL Mouth Rinse BID  . montelukast  10 mg Oral QHS  . pantoprazole  80 mg Oral Daily  . rosuvastatin  40 mg Oral QHS  . sodium chloride flush  3 mL Intravenous Q12H  . sodium chloride flush  3 mL Intravenous Q12H   Continuous Infusions: . sodium chloride    . sodium chloride       LOS:  6 days    Time spent: 25 minuets    Edwin Dada, MD [facility] 06/12/2018, 2:06 PM     Pager [phone] --- please page though AMION:  [email or web address] If 7PM-7AM, please contact night-coverage

## 2018-06-12 NOTE — Progress Notes (Signed)
EKG CRITICAL VALUE     12 lead EKG performed.  Critical value noted.  Jarrett Soho, RN notified.   Genia Plants, CCT 06/12/2018 7:54 AM

## 2018-06-12 NOTE — Progress Notes (Signed)
0947-0962 MI education completed with pt who voiced understanding when we talked but stated she gets confused quickly. During conversation she would lose her train of thought and asked what we were talking about. Left handouts and written materials for pt and family to read. Stressed importance of plavix with stent. Discussed MI restrictions, left heart healthy and diabetic diets, reviewed NTG use, CRP 2.  Will refer to [place] CRP 2. Pt not sure if she would have transportation to attend. Did not give ex ed as pt unsteady and needs Rehab.  Pt knows she needs Rehab to get steadier. Graylon Good RN BSN 06/12/2018 2:37 PM

## 2018-06-13 DIAGNOSIS — N179 Acute kidney failure, unspecified: Secondary | ICD-10-CM

## 2018-06-13 DIAGNOSIS — N990 Postprocedural (acute) (chronic) kidney failure: Secondary | ICD-10-CM

## 2018-06-13 DIAGNOSIS — I208 Other forms of angina pectoris: Secondary | ICD-10-CM

## 2018-06-13 LAB — CBC
HEMATOCRIT: 34.8 % — AB (ref 36.0–46.0)
Hemoglobin: 10.3 g/dL — ABNORMAL LOW (ref 12.0–15.0)
MCH: 24.8 pg — ABNORMAL LOW (ref 26.0–34.0)
MCHC: 29.6 g/dL — AB (ref 30.0–36.0)
MCV: 83.9 fL (ref 78.0–100.0)
PLATELETS: UNDETERMINED 10*3/uL (ref 150–400)
RBC: 4.15 MIL/uL (ref 3.87–5.11)
RDW: 17.2 % — AB (ref 11.5–15.5)
WBC: 11.5 10*3/uL — AB (ref 4.0–10.5)

## 2018-06-13 LAB — BASIC METABOLIC PANEL
Anion gap: 11 (ref 5–15)
BUN: 30 mg/dL — AB (ref 8–23)
CALCIUM: 9.1 mg/dL (ref 8.9–10.3)
CO2: 24 mmol/L (ref 22–32)
CREATININE: 1.82 mg/dL — AB (ref 0.44–1.00)
Chloride: 102 mmol/L (ref 98–111)
GFR calc Af Amer: 32 mL/min — ABNORMAL LOW (ref >60)
GFR, EST NON AFRICAN AMERICAN: 28 mL/min — AB (ref >60)
GLUCOSE: 167 mg/dL — AB (ref 70–99)
Potassium: 4.5 mmol/L (ref 3.5–5.1)
SODIUM: 137 mmol/L (ref 135–145)

## 2018-06-13 LAB — GLUCOSE, CAPILLARY
GLUCOSE-CAPILLARY: 180 mg/dL — AB (ref 70–99)
GLUCOSE-CAPILLARY: 199 mg/dL — AB (ref 70–99)
GLUCOSE-CAPILLARY: 195 mg/dL — AB (ref 70–99)
Glucose-Capillary: 194 mg/dL — ABNORMAL HIGH (ref 70–99)

## 2018-06-13 MED ORDER — ISOSORBIDE MONONITRATE ER 30 MG PO TB24
30.0000 mg | ORAL_TABLET | Freq: Every day | ORAL | Status: DC
  Administered 2018-06-13 – 2018-06-19 (×7): 30 mg via ORAL
  Filled 2018-06-13 (×7): qty 1

## 2018-06-13 MED ORDER — SODIUM CHLORIDE 0.45 % IV SOLN
INTRAVENOUS | Status: AC
  Administered 2018-06-13: 15:00:00 via INTRAVENOUS

## 2018-06-13 MED ORDER — ENOXAPARIN SODIUM 30 MG/0.3ML ~~LOC~~ SOLN
30.0000 mg | SUBCUTANEOUS | Status: DC
  Administered 2018-06-14 – 2018-06-19 (×6): 30 mg via SUBCUTANEOUS
  Filled 2018-06-13 (×6): qty 0.3

## 2018-06-13 MED ORDER — SODIUM CHLORIDE 0.9 % IV BOLUS
500.0000 mL | Freq: Once | INTRAVENOUS | Status: AC
  Administered 2018-06-13: 500 mL via INTRAVENOUS

## 2018-06-13 NOTE — Progress Notes (Signed)
1255 Education was completed with pt yesterday. As pt only able to walk to door and back, we will sign off. Pt more appropriate for PT at this time. Has referral for [place] Phase 2. Graylon Good RN BSN 06/13/2018 12:56 PM

## 2018-06-13 NOTE — Progress Notes (Signed)
PROGRESS NOTE    Mary Raymond  NID:[id number] DOB: 1951/07/17 DOA: 06/05/2018 [name], FNP      Brief Narrative:  Mary Raymond is a 67 y.o. F with fibromyalgia, morbid obesity, HTN, DM who prsetned with chest pain, found in the ER to have elevated troponin.       Assessment & Plan:  NSTEMI LHC showed 3 vessel disease in diabetic.  Evaluated by CT surgery, who recommended not to do coronary bypass grafting due to extensive comorbidities.  To cath today for possible stenting by Cardiology -Continue aspirin, Plavix -Continue Crestor 40 -hold losartan due to AKI -Beta-blocker contraindicated with sec deg HB -EF assessed, 60-65%   LEFT Occipital lobe CVA -Non-invasive angiography showed vertebral artery, aortic atherosclerosis, no significant carotid stenosis -Echocardiogram showed no cardiogenic source of embolism   -Lipids ordered: continue Crestor -Aspirin ordered at admission  -tPA not given because no LVO present -PT eval ordered: recommended SNF -Smoking cessation: not pertintent -Atrial fibrillation: not present, although Neurology recommend 30 day event monitor  AKI - prob due to IV contrast given 9/25, new problem - losartan and po lasix 20qd dc'd already this am - no other renal adverse meds on boards - will keep in house , cancel plans for SNF today - supportive care, renal involvement if not improving  Hypertension -Continue amlodipine, losartan, Lasix, hydralazine  Diabetes Glucoses reasonable control -Continue glargine -Restart mealtime insulin -Continue SSI  Other medications -Continue acyclovir -Continue norco  PRN -Continue Singulair -Continue pantoprazole  Hypothyroidism -Continue lveothyroxine  ELevated creatinine Baseline 0.9, stable at 1.2 today -Daily BMP  Anemia, presumed chronic disease - Hb stable 10- 11 range now  Second degree AV block, type 1 - avoiding all nodal blocking agents.    Mary Splinter  MD [facility] pgr [phone] 02/09/2018, 9:25 AM   DVT prophylaxis: Lovenox Code Status: FULL Family Communication: None present Disposition Plan: new AKI will hold off on SNF placement for now     Consultants:   Cardiology  CT surgery  Neurology  Procedures:   Echocardiogram 9/20 LV EF: 60% -   65%  ------------------------------------------------------------------- Indications:      Chest pain 786.51.  ------------------------------------------------------------------- History:   PMH:  Elevated Troponin.  Risk factors:  Hypertension. Dyslipidemia.  ------------------------------------------------------------------- Study Conclusions  - Left ventricle: The cavity size was normal. Wall thickness was   increased in a pattern of moderate LVH. Systolic function was   normal. The estimated ejection fraction was in the range of 60%   to 65%. Wall motion was normal; there were no regional wall   motion abnormalities. Indeterminate diastolic function. - Aortic valve: Moderately calcified annulus. Trileaflet; mildly   calcified leaflets. - Mitral valve: Moderately calcified annulus. There was mild   regurgitation. - Left atrium: The atrium was mildly dilated. - Right atrium: Central venous pressure (est): 15 mm Hg. - Atrial septum: No defect or patent foramen ovale was identified. - Tricuspid valve: There was trivial regurgitation. - Pulmonary arteries: Systolic pressure could not be accurately   estimated. - Pericardium, extracardiac: A prominent pericardial fat pad was   present.   Left heart catheterization 9/20  Prox RCA to Mid RCA lesion is 90% stenosed.  Dist RCA lesion is 80% stenosed.  Mid RCA lesion is 80% stenosed.  Acute Mrg lesion is 40% stenosed.  Mid LM lesion is 55% stenosed.  Ost Cx lesion is 90% stenosed.  Prox Cx lesion is 85% stenosed.  Ost LAD to Prox  LAD lesion is 50% stenosed.   Severe multivessel CAD with  evidence for coronary calcification. There is  55% focal distal left main stenosis, calcification of the proximal LAD with 50% stenosis, 90% ostial left circumflex stenosis followed by 85% calcified proximal stenosis; and a large dominant RCA with severe eccentric calcification with 90% stenosis proximally, 80% stenosis in the region of the crux and 80% stenosis prior to the PDA takeoff with 40% marginal/PDA branch stenosis.  Preserved global LV contractility with inferior hypocontractility and ejection fraction of approximately 55%.  LVEDP 17 mm Hg.    RECOMMENDATION: Surgical consultation for CABG revascularization.  The patient will be started on heparin therapy 8 hours post sheath removal.  She will also be started on IV nitroglycerin.  I have discussed the catheterization findings with Mary Raymond.  She has been documented to have intermittent second-degree type I block. She will transported to [facility] for further evaluation and treatment prior to subsequent CABG revascularization. Recommend Aspirin 81mg  daily and will not start DAPT  due to need for surgery.   Carotid US and bilateral ABI 9/22 Final Interpretation: Right Carotid: Velocities in the right ICA are consistent with a 1-39% stenosis.  Left Carotid: Velocities in the left ICA are consistent with a 1-39% stenosis.  Right ABI: Resting right ankle-brachial index is within normal range. No evidence of significant right lower extremity arterial disease. Left ABI: Resting left ankle-brachial index is within normal range. No evidence of significant left lower extremity arterial disease.  Right Upper Extremity: Allen's test not performed due to TR band. Left Upper Extremity: Doppler waveform obliterate with left radial compression. Doppler waveforms remain within normal limits with left ulnar compression.    Left heart catheterization 06/11/2018   Antimicrobials:   None    Subjective: No chest pain.  Dyspnea, present, mild.  No  groin bleeding, no leg swelling, no abdominal pain, fever, cough, sputum.  Vision field deficit still present, no other focal neurological deficits.         Objective: Vitals:   06/12/18 1300 06/12/18 1343 06/12/18 2040 06/13/18 0310  BP:  138/62 (!) 147/65 (!) 153/46  Pulse:  60 60 (!) 50  Resp: (!) 22  20 18   Temp:  97.8 F (36.6 C) 97.9 F (36.6 C) 98 F (36.7 C)  TempSrc:  Oral Oral Oral  SpO2:  95% 96% 98%  Weight:      Height:        Intake/Output Summary (Last 24 hours) at 06/13/2018 1118 Last data filed at 06/13/2018 6144 Gross per 24 hour  Intake 60 ml  Output 1400 ml  Net -1340 ml   Filed Weights   06/05/18 1454 06/05/18 1835 06/06/18 1513  Weight: 126.7 kg 125.6 kg 122.4 kg    Examination: General appearance: obese adult female, lying in bed, interactive   HEENT: Anicteric, conjunctiva pink, lids and lashes normal. No nasal deformity, discharge, epistaxis.  Lips moist, dentition normal, no oral lesions, OP moist, hearing normal.   Skin: Warm and dry.  no jaundice.  No suspicious rashes or lesions. Cardiac: RRR, soft systolic murmur, JVP not visible, no LE edema.    Respiratory: Normal RR, non-labored, no rales or wheezes, diminihsed thoguhtout due to habitus Abdomen: Abdomen soft without focal tenderness to palpatioin, distension. MSK: No deformities or effusions. Neuro: Awake and alert.  Visual field defect in right eye, otherwise, CNs normal, EOMI, moves all extremities with equal strength and coordination. Speech fluent.    Psych: Sensorium intact and  responding to questions, attention normal, judgment appears normal.   Data Reviewed: I have personally reviewed following labs and imaging studies:  CBC: Recent Labs  Lab 06/09/18 0923 06/10/18 0634 06/11/18 0237 06/12/18 0526 06/13/18 0411  WBC 10.5 10.5 10.1 10.5 11.5*  HGB 10.5* 9.4* 9.5* 10.5* 10.3*  HCT 35.3* 32.0* 32.2* 35.4* 34.8*  MCV 83.5 83.3 82.8 82.7 83.9  PLT 215 191 183 222 PLATELET  CLUMPS NOTED ON SMEAR, UNABLE TO ESTIMATE   Basic Metabolic Panel: Recent Labs  Lab 06/07/18 0311 06/11/18 0746 06/12/18 0526 06/13/18 0411  NA 138 140 141 137  K 4.8 4.9 4.4 4.5  CL 105 102 106 102  CO2 24 24 28 24   GLUCOSE 260* 176* 129* 167*  BUN 16 24* 20 30*  CREATININE 0.99 1.26* 1.27* 1.82*  CALCIUM 8.7* 9.4 9.1 9.1   GFR: Estimated Creatinine Clearance: 39.9 mL/min (A) (by C-G formula based on SCr of 1.82 mg/dL (H)). Liver Function Tests: No results for input(s): AST, ALT, ALKPHOS, BILITOT, PROT, ALBUMIN in the last 168 hours. No results for input(s): LIPASE, AMYLASE in the last 168 hours. No results for input(s): AMMONIA in the last 168 hours. Coagulation Profile: No results for input(s): INR, PROTIME in the last 168 hours. Cardiac Enzymes: No results for input(s): CKTOTAL, CKMB, CKMBINDEX, TROPONINI in the last 168 hours. BNP (last 3 results) No results for input(s): PROBNP in the last 8760 hours. HbA1C: No results for input(s): HGBA1C in the last 72 hours. CBG: Recent Labs  Lab 06/12/18 0757 06/12/18 1202 06/12/18 1623 06/12/18 2130 06/13/18 0740  GLUCAP 126* 283* 292* 231* 180*   Lipid Profile: No results for input(s): CHOL, HDL, LDLCALC, TRIG, CHOLHDL, LDLDIRECT in the last 72 hours. Thyroid Function Tests: No results for input(s): TSH, T4TOTAL, FREET4, T3FREE, THYROIDAB in the last 72 hours. Anemia Panel: No results for input(s): VITAMINB12, FOLATE, FERRITIN, TIBC, IRON, RETICCTPCT in the last 72 hours. Urine analysis:    Component Value Date/Time   COLORURINE YELLOW 09/12/2017 1047   APPEARANCEUR Clear 02/25/2018 1050   LABSPEC 1.017 09/12/2017 1047   PHURINE 5.0 09/12/2017 1047   GLUCOSEU Negative 02/25/2018 1050   HGBUR SMALL (A) 09/12/2017 1047   BILIRUBINUR Negative 02/25/2018 1050   KETONESUR NEGATIVE 09/12/2017 1047   PROTEINUR 3+ (A) 02/25/2018 1050   PROTEINUR >=300 (A) 09/12/2017 1047   UROBILINOGEN negative 11/14/2015 0949    NITRITE Negative 02/25/2018 1050   NITRITE NEGATIVE 09/12/2017 1047   LEUKOCYTESUR Negative 02/25/2018 1050   Sepsis Labs: @LABRCNTIP (procalcitonin:4,lacticacidven:4)  ) Recent Results (from the past 240 hour(s))  MRSA PCR Screening     Status: None   Collection Time: 06/06/18  5:47 PM  Result Value Ref Range Status   MRSA by PCR NEGATIVE NEGATIVE Final    Comment:        The GeneXpert MRSA Assay (FDA approved for NASAL specimens only), is one component of a comprehensive MRSA colonization surveillance program. It is not intended to diagnose MRSA infection nor to guide or monitor treatment for MRSA infections. Performed at [facility], [place] [address] [place], [place] [address]          Radiology Studies: No results found.      Scheduled Meds: . acyclovir  400 mg Oral Daily  . amLODipine  10 mg Oral Daily  . aspirin EC  81 mg Oral Daily  . clopidogrel  75 mg Oral Daily  . enoxaparin (LOVENOX) injection  40 mg Subcutaneous Q24H  . fluticasone  2 spray Each Nare Daily  . hydrALAZINE  25 mg Oral BID  . HYDROcodone-acetaminophen  1 tablet Oral QHS  . insulin aspart  0-5 Units Subcutaneous QHS  . insulin aspart  0-9 Units Subcutaneous TID WC  . insulin aspart  15 Units Subcutaneous TID WC  . insulin glargine  25 Units Subcutaneous BID  . levothyroxine  100 mcg Oral QAC breakfast  . mouth rinse  15 mL Mouth Rinse BID  . montelukast  10 mg Oral QHS  . pantoprazole  80 mg Oral Daily  . rosuvastatin  40 mg Oral QHS  . sodium chloride flush  3 mL Intravenous Q12H  . sodium chloride flush  3 mL Intravenous Q12H   Continuous Infusions: . sodium chloride    . sodium chloride    . sodium chloride 500 mL (06/13/18 1112)     LOS: 7 days

## 2018-06-13 NOTE — Progress Notes (Addendum)
Progress Note  Patient Name: Mary Raymond Date of Encounter: 06/13/2018  Primary Cardiologist: Carlyle Dolly, MD  Subjective   Had intermittent chest pain this morning. No dyspnea.   Inpatient Medications    Scheduled Meds: . acyclovir  400 mg Oral Daily  . amLODipine  10 mg Oral Daily  . aspirin EC  81 mg Oral Daily  . clopidogrel  75 mg Oral Daily  . [START ON 06/14/2018] enoxaparin (LOVENOX) injection  30 mg Subcutaneous Q24H  . fluticasone  2 spray Each Nare Daily  . hydrALAZINE  25 mg Oral BID  . HYDROcodone-acetaminophen  1 tablet Oral QHS  . insulin aspart  0-5 Units Subcutaneous QHS  . insulin aspart  0-9 Units Subcutaneous TID WC  . insulin aspart  15 Units Subcutaneous TID WC  . insulin glargine  25 Units Subcutaneous BID  . isosorbide mononitrate  30 mg Oral Daily  . levothyroxine  100 mcg Oral QAC breakfast  . mouth rinse  15 mL Mouth Rinse BID  . montelukast  10 mg Oral QHS  . pantoprazole  80 mg Oral Daily  . rosuvastatin  40 mg Oral QHS  . sodium chloride flush  3 mL Intravenous Q12H  . sodium chloride flush  3 mL Intravenous Q12H   Continuous Infusions: . sodium chloride    . sodium chloride    . sodium chloride 500 mL (06/13/18 1112)   PRN Meds: sodium chloride, sodium chloride, acetaminophen, albuterol, ALPRAZolam, gi cocktail, ipratropium-albuterol, LORazepam, morphine injection, ondansetron (ZOFRAN) IV, sodium chloride flush, sodium chloride flush   Vital Signs    Vitals:   06/12/18 1300 06/12/18 1343 06/12/18 2040 06/13/18 0310  BP:  138/62 (!) 147/65 (!) 153/46  Pulse:  60 60 (!) 50  Resp: (!) 22  20 18   Temp:  97.8 F (36.6 C) 97.9 F (36.6 C) 98 F (36.7 C)  TempSrc:  Oral Oral Oral  SpO2:  95% 96% 98%  Weight:      Height:        Intake/Output Summary (Last 24 hours) at 06/13/2018 1240 Last data filed at 06/13/2018 1200 Gross per 24 hour  Intake 60 ml  Output 1900 ml  Net -1840 ml   Filed Weights   06/05/18 1454  06/05/18 1835 06/06/18 1513  Weight: 126.7 kg 125.6 kg 122.4 kg    Telemetry    Mobitz type I at 50-60s - Personally Reviewed  ECG    Mobitz type 1 on 45 bpm - Personally Reviewed  Physical Exam   GEN: No acute distress.   Neck: No JVD Cardiac: RRR, no murmurs, rubs, or gallops.  Respiratory: Clear to auscultation bilaterally. GI: Soft, nontender, non-distended  MS: trace BL edema; No deformity. Neuro:  Nonfocal  Psych: Normal affect   Labs    Chemistry Recent Labs  Lab 06/11/18 0746 06/12/18 0526 06/13/18 0411  NA 140 141 137  K 4.9 4.4 4.5  CL 102 106 102  CO2 24 28 24   GLUCOSE 176* 129* 167*  BUN 24* 20 30*  CREATININE 1.26* 1.27* 1.82*  CALCIUM 9.4 9.1 9.1  GFRNONAA 43* 43* 28*  GFRAA 50* 50* 32*  ANIONGAP 14 7 11      Hematology Recent Labs  Lab 06/11/18 0237 06/12/18 0526 06/13/18 0411  WBC 10.1 10.5 11.5*  RBC 3.89 4.28 4.15  HGB 9.5* 10.5* 10.3*  HCT 32.2* 35.4* 34.8*  MCV 82.8 82.7 83.9  MCH 24.4* 24.5* 24.8*  MCHC 29.5* 29.7* 29.6*  RDW  16.6* 16.9* 17.2*  PLT 183 222 PLATELET CLUMPS NOTED ON SMEAR, UNABLE TO ESTIMATE    Radiology    No results found.  Cardiac Studies   Cath/PCI: Conclusion     Prox RCA to Mid RCA lesion is 95% stenosed.  A drug-eluting stent was successfully placed using a STENT SYNERGY DES 4X20.  Post intervention, there is a 0% residual stenosis.  Dist RCA lesion is 65% stenosed.  Mid RCA lesion is 80% stenosed.  Post intervention, there is a 0% residual stenosis.  A drug-eluting stent was successfully placed using a STENT SYNERGY DES 3.5X20.  Acute Mrg lesion is 40% stenosed.  1. Successful PCI of the proximal and mid RCA with orbital atherectomy and stenting with DES x 2  Plan: observe in the unit tonight due to groin bleeding.   Recommend uninterrupted dual antiplatelet therapy with Aspirin 81mg  daily and Clopidogrel 75mg  dailyfor a minimum of 12 months (ACS - Class I recommendation).    Diagnostic Diagram       Post-Intervention Diagram         Echo 06/06/18 Study Conclusions  - Left ventricle: The cavity size was normal. Wall thickness was   increased in a pattern of moderate LVH. Systolic function was   normal. The estimated ejection fraction was in the range of 60%   to 65%. Wall motion was normal; there were no regional wall   motion abnormalities. Indeterminate diastolic function. - Aortic valve: Moderately calcified annulus. Trileaflet; mildly   calcified leaflets. - Mitral valve: Moderately calcified annulus. There was mild   regurgitation. - Left atrium: The atrium was mildly dilated. - Right atrium: Central venous pressure (est): 15 mm Hg. - Atrial septum: No defect or patent foramen ovale was identified. - Tricuspid valve: There was trivial regurgitation. - Pulmonary arteries: Systolic pressure could not be accurately   estimated. - Pericardium   Patient Profile     67 y.o. female with past medical history of HTN, HLD, and type II DM who presented to [facility], ED on 06/05/2018 for evaluation of chest pain. Troponin value elevated to 0.34. Underwent cath with multivessel disease. Felt not to be a candidate for CABG and planned for PCI.  Assessment & Plan    1.  Non-STEMI: The patient underwent successful complex PCI of the right coronary artery. Peak of troponin 0.35. Her residual severe stenosis in the left circumflex will be treated medically as she has extremely complex disease extending back to the left main it is unfavorable for PCI.  She should continue on aspirin and clopidogrel. No BB due to bradycardia.   2.  Second-degree AV block Mobitz 1 with periods of marked bradycardia in 40s: Not a candidate for beta-blockade.  Appears asymptomatic.  3.  Type 2 diabetes: Management per the [facility] team.  4.  Hyperlipidemia: Patient is treated with rosuvastatin 40 mg daily. 06/09/2018: Cholesterol 170; HDL 40; LDL Cholesterol 97;  Triglycerides 165; VLDL 33  5.  Stroke:  Continue antiplatelet therapy, high intensity statin drug, and aggressive medical therapy.   6. AKI: Likely her elevated creatinine due to contrast. Normal creatinine presentation. Hold lasix and Losartan (unfortunely she got both this morning prior to evaluation). Give fluids 50cc/hour for 10 hours. Check BMET tomorrow.  Lab Results  Component Value Date   CREATININE 1.82 (H) 06/13/2018   CREATININE 1.27 (H) 06/12/2018   CREATININE 1.26 (H) 06/11/2018   7. Mild peripheral edema - She takes lasix 20mg  at home. Hold as above. Her edema  could be due to high dose amlodipine. Reassess as outpatient. Normal LVEF by echocardiogram.   For questions or updates, please contact [place] Please consult [email or web address] for contact info under    Patient seen, examined. Available data reviewed. Agree with findings, assessment, and plan as outlined by Robbie Lis, PA.  The patient is independently interviewed and examined.  On my exam she is alert and oriented in no distress, obese woman, pleasant.  Lung fields are clear, heart is regular rate and rhythm with a grade 2/6 systolic ejection murmur at the right upper sternal border, abdomen is soft and nontender, extremities show no edema.  I agree with holding furosemide and losartan because of AKI.  Repeat labs tomorrow morning.  The patient does admit to a very brief episode of chest discomfort when she became angry this morning with a family member.  She does have residual CAD involving the left circumflex/obtuse marginal.  I recommended adding isosorbide 30 mg daily to her medical regimen.  She is otherwise stable from a cardiac perspective and her other medications are reviewed and should be continued.  Sherren Mocha, M.D. 06/13/2018 12:41 PM

## 2018-06-13 NOTE — Progress Notes (Signed)
Physical Therapy Treatment Patient Details Name: Mary Raymond MRN: [id number] DOB: 08-16-1951 Today's Date: 06/13/2018    History of Present Illness 67 y.o. female admitted to [facility] 9/19 with NSTEMI with heart cath on 9/20, and pt with right visual loss (9/22) and MRI showing small subcentimeter left occipital infarcts. PMHx:  HTN, HLD, type II DM, obesity, gout    PT Comments    Pt admitted with above diagnosis. Pt currently with functional limitations due to balance and endurance deficits. Pt was able to ambulate with min assist and RW to bathroom, in room and back to bed.  STill needs assist with cleaning herself.  Pt will benefit from skilled PT to increase their independence and safety with mobility to allow discharge to the venue listed below.     Follow Up Recommendations  SNF;Supervision for mobility/OOB     Equipment Recommendations  Rolling walker with 5" wheels    Recommendations for Other Services       Precautions / Restrictions Precautions Precautions: Fall Precaution Comments: R visual field cut Restrictions Weight Bearing Restrictions: No    Mobility  Bed Mobility               General bed mobility comments: sitting EOB on arrival  Transfers Overall transfer level: Needs assistance Equipment used: Rolling walker (2 wheeled) Transfers: Sit to/from Stand Sit to Stand: Min guard         General transfer comment: increased time and effort.  Pt went to bathroom and urinated.  Needed assist to be cleaned and cream on bottom.   Ambulation/Gait Ambulation/Gait assistance: Min guard Gait Distance (Feet): 40 Feet Assistive device: Rolling walker (2 wheeled) Gait Pattern/deviations: Step-through pattern;Decreased stride length;Trunk flexed Gait velocity: decreased  Gait velocity interpretation: <1.8 ft/sec, indicate of risk for recurrent falls General Gait Details: trunk flexed, use of RW with min guard. Pt fatigues quickly however pt was able  to ambulate to door and back x 2.     Stairs             Wheelchair Mobility    Modified Rankin (Stroke Patients Only) Modified Rankin (Stroke Patients Only) Pre-Morbid Rankin Score: Moderate disability Modified Rankin: Moderately severe disability     Balance Overall balance assessment: Needs assistance Sitting-balance support: No upper extremity supported;Feet supported Sitting balance-Leahy Scale: Fair Sitting balance - Comments: Able to maintain static sitting   Standing balance support: Bilateral upper extremity supported;During functional activity Standing balance-Leahy Scale: Fair Standing balance comment: can release walker in static standing without LOB                            Cognition Arousal/Alertness: Awake/alert Behavior During Therapy: WFL for tasks assessed/performed Overall Cognitive Status: Impaired/Different from baseline Area of Impairment: Memory;Following commands;Safety/judgement;Problem solving;Attention                   Current Attention Level: Sustained Memory: Decreased short-term memory Following Commands: Follows one step commands with increased time Safety/Judgement: Decreased awareness of safety;Decreased awareness of deficits   Problem Solving: Slow processing;Requires verbal cues General Comments: pt losing train of thought during conversation      Exercises      General Comments        Pertinent Vitals/Pain Pain Assessment: No/denies pain    Home Living                      Prior Function  PT Goals (current goals can now be found in the care plan section) Progress towards PT goals: Progressing toward goals    Frequency    Min 3X/week      PT Plan Current plan remains appropriate    Co-evaluation              AM-PAC PT "6 Clicks" Daily Activity  Outcome Measure  Difficulty turning over in bed (including adjusting bedclothes, sheets and blankets)?:  Unable Difficulty moving from lying on back to sitting on the side of the bed? : Unable Difficulty sitting down on and standing up from a chair with arms (e.g., wheelchair, bedside commode, etc,.)?: A Little Help needed moving to and from a bed to chair (including a wheelchair)?: A Little Help needed walking in hospital room?: A Little Help needed climbing 3-5 steps with a railing? : A Lot 6 Click Score: 13    End of Session Equipment Utilized During Treatment: Gait belt Activity Tolerance: Patient limited by fatigue Patient left: with call bell/phone within reach;in bed Nurse Communication: Mobility status PT Visit Diagnosis: Unsteadiness on feet (R26.81);Other abnormalities of gait and mobility (R26.89);Muscle weakness (generalized) (M62.81);Other symptoms and signs involving the nervous system (R29.898)     Time: 6333-5456 PT Time Calculation (min) (ACUTE ONLY): 28 min  Charges:  $Gait Training: 23-37 mins                     [place] Pager:  [phone]  Office:  [place] 06/13/2018, 12:15 PM

## 2018-06-14 LAB — BASIC METABOLIC PANEL
Anion gap: 8 (ref 5–15)
BUN: 35 mg/dL — ABNORMAL HIGH (ref 8–23)
CO2: 25 mmol/L (ref 22–32)
Calcium: 8.8 mg/dL — ABNORMAL LOW (ref 8.9–10.3)
Chloride: 103 mmol/L (ref 98–111)
Creatinine, Ser: 2.15 mg/dL — ABNORMAL HIGH (ref 0.44–1.00)
GFR calc Af Amer: 26 mL/min — ABNORMAL LOW (ref >60)
GFR calc non Af Amer: 23 mL/min — ABNORMAL LOW (ref >60)
Glucose, Bld: 202 mg/dL — ABNORMAL HIGH (ref 70–99)
Potassium: 4.2 mmol/L (ref 3.5–5.1)
Sodium: 136 mmol/L (ref 135–145)

## 2018-06-14 LAB — GLUCOSE, CAPILLARY
GLUCOSE-CAPILLARY: 186 mg/dL — AB (ref 70–99)
GLUCOSE-CAPILLARY: 243 mg/dL — AB (ref 70–99)
Glucose-Capillary: 170 mg/dL — ABNORMAL HIGH (ref 70–99)
Glucose-Capillary: 202 mg/dL — ABNORMAL HIGH (ref 70–99)

## 2018-06-14 NOTE — Progress Notes (Addendum)
6E 27 c/o warmth head to toe x2 , BP 140/43, HR in 50s, afibrile, SpO2 98 on 2L [place],  Lung fields diminished, lower bases bilaterally shallow movement,  then sudden SHOB while in room , ekg taken AFIB slow Vent rate.  RT called and in room with treatment.  MD returned call , reviewed ekg, no new orders.

## 2018-06-14 NOTE — Progress Notes (Signed)
Progress Note  Patient Name: Mary Raymond Date of Encounter: 06/14/2018  Primary Cardiologist: Carlyle Dolly, MD   Subjective   No Chest pain or dyspnea  Inpatient Medications    Scheduled Meds: . acyclovir  400 mg Oral Daily  . amLODipine  10 mg Oral Daily  . aspirin EC  81 mg Oral Daily  . clopidogrel  75 mg Oral Daily  . enoxaparin (LOVENOX) injection  30 mg Subcutaneous Q24H  . fluticasone  2 spray Each Nare Daily  . hydrALAZINE  25 mg Oral BID  . HYDROcodone-acetaminophen  1 tablet Oral QHS  . insulin aspart  0-5 Units Subcutaneous QHS  . insulin aspart  0-9 Units Subcutaneous TID WC  . insulin aspart  15 Units Subcutaneous TID WC  . insulin glargine  25 Units Subcutaneous BID  . isosorbide mononitrate  30 mg Oral Daily  . levothyroxine  100 mcg Oral QAC breakfast  . mouth rinse  15 mL Mouth Rinse BID  . montelukast  10 mg Oral QHS  . pantoprazole  80 mg Oral Daily  . rosuvastatin  40 mg Oral QHS  . sodium chloride flush  3 mL Intravenous Q12H  . sodium chloride flush  3 mL Intravenous Q12H   Continuous Infusions: . sodium chloride 50 mL/hr at 06/13/18 1456   PRN Meds: acetaminophen, albuterol, ALPRAZolam, gi cocktail, ipratropium-albuterol, LORazepam, morphine injection, ondansetron (ZOFRAN) IV, sodium chloride flush, sodium chloride flush   Vital Signs    Vitals:   06/14/18 0000 06/14/18 0025 06/14/18 0500 06/14/18 0652  BP:    (!) 141/47  Pulse: 60  (!) 42 63  Resp:      Temp:      TempSrc:      SpO2: 97% 96% 98% 99%  Weight:      Height:        Intake/Output Summary (Last 24 hours) at 06/14/2018 1135 Last data filed at 06/14/2018 0035 Gross per 24 hour  Intake 546 ml  Output 1000 ml  Net -454 ml   Filed Weights   06/05/18 1454 06/05/18 1835 06/06/18 1513  Weight: 126.7 kg 125.6 kg 122.4 kg    Telemetry    Sinus with mobitz 1, 2:1 AV block and occasional junctional escape beat- Personally Reviewed  Physical Exam   GEN: No  acute distress.  obese Neck: No JVD Cardiac: irregular and bradycardic Respiratory: Clear to auscultation bilaterally. GI: Soft, nontender, non-distended  MS: No edema Neuro:  Nonfocal  Psych: Normal affect   Labs    Chemistry Recent Labs  Lab 06/12/18 0526 06/13/18 0411 06/14/18 0537  NA 141 137 136  K 4.4 4.5 4.2  CL 106 102 103  CO2 28 24 25   GLUCOSE 129* 167* 202*  BUN 20 30* 35*  CREATININE 1.27* 1.82* 2.15*  CALCIUM 9.1 9.1 8.8*  GFRNONAA 43* 28* 23*  GFRAA 50* 32* 26*  ANIONGAP 7 11 8      Hematology Recent Labs  Lab 06/11/18 0237 06/12/18 0526 06/13/18 0411  WBC 10.1 10.5 11.5*  RBC 3.89 4.28 4.15  HGB 9.5* 10.5* 10.3*  HCT 32.2* 35.4* 34.8*  MCV 82.8 82.7 83.9  MCH 24.4* 24.5* 24.8*  MCHC 29.5* 29.7* 29.6*  RDW 16.6* 16.9* 17.2*  PLT 183 222 PLATELET CLUMPS NOTED ON SMEAR, UNABLE TO ESTIMATE     Patient Profile   67 y.o.femalewith past medical history of HTN, HLD, and type II DM who presented to [facility], ED on 06/05/2018 for evaluation of chest  pain. Troponin value elevated to 0.34. Underwent cath with multivessel disease. Felt not to be a candidate for CABG and planned for PCI. Subsequently underwent PCI of RCA.  Echocardiogram this admission showed normal LV function, mild mitral regurgitation, mild left atrial enlargement.  Assessment & Plan    1 coronary artery disease-status post PCI of RCA.  Residual circumflex disease treated medically.  Continue aspirin, Plavix and statin.  Continue amlodipine and isosorbide.  No beta-blocker given Mobitz 1 second-degree AV block.  2 acute on chronic stage III kidney disease-likely component of contrast nephropathy.  We will recheck renal function tomorrow.  Diuretic and ARB on hold.  3 hyperlipidemia-continue statin.  4 CVA-continue aspirin and Plavix.  Continue statin.  5 hypertension-blood pressure elevated.  Resume ARB when renal function stable.  For questions or updates, please contact [place] Please consult [email or web address] for contact info under        Signed, Kirk Ruths, MD  06/14/2018, 11:35 AM

## 2018-06-14 NOTE — Progress Notes (Signed)
PROGRESS NOTE    Mary Raymond  GEX:[id number] DOB: [birth year] DOA: 06/05/2018 [name], FNP      Brief Narrative:  Mary Raymond is a 67 y.o. F with fibromyalgia, morbid obesity, HTN, DM who prsetned with chest pain, found in the ER to have elevated troponin.       Assessment & Plan:  NSTEMI LHC 9/20 showed 3 vessel disease in diabetic.  Evaluated by CT surgery, who recommended not to do coronary bypass grafting due to extensive comorbidities, debility and acute CVA.  Went back to cath lab and had PCI x 2 to RCA, no intervention to LCx which was tight ostial lesion due to backing up into L main and too complex to correct. Medical Rx.  LAD w/o any tight lesions.  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 Neurology suspected that primary cause was post-procedural after heart cath.    -Lipids ordered: continue Crestor -Aspirin ordered at admission  -tPA not given because no LVO present -PT eval ordered: recommended SNF -Smoking cessation: not pertintent -Atrial fibrillation: not present, although Neurology recommended 30 day event monitor  AKI - prob due to IV contrast given 9/25 - creat 0.9 baseline - creat ^1.2 > 1.8 yesterday >  2.1 today (got IV contrast on 9/25) - losartan and po lasix on hold - 24 hrs IVF's ordered per cardiology yest - no other renal adverse meds on boards - holding off on SNF placement until renal fxn starts to improve - BP's normal, wt's down  Hypertension -Continue amlodipine,   Hydralazine - losartan and lasix on hold as above  Diabetes Glucoses reasonable control -Continue glargine -Restart mealtime insulin -Continue SSI  Other medications -Continue acyclovir -Continue norco  PRN -Continue Singulair -Continue pantoprazole  Hypothyroidism -Continue lveothyroxine  ELevated creatinine Baseline 0.9, stable at 1.2 today -Daily BMP  Anemia, presumed chronic disease - Hb stable 10- 11 range now   Kelly Splinter MD [facility] pgr [phone] 02/09/2018, 9:25 AM   DVT prophylaxis: Lovenox Code Status: FULL Family Communication: Spoke at length w/ daughter yest 9/27 Disposition Plan: new AKI will hold off on SNF placement until creat improving     Consultants:   Cardiology  CT surgery  Neurology  Procedures:   Echocardiogram 9/20 LV EF: 60% -   65%  ------------------------------------------------------------------- Indications:      Chest pain 786.51.  ------------------------------------------------------------------- History:   PMH:  Elevated Troponin.  Risk factors:  Hypertension. Dyslipidemia.  ------------------------------------------------------------------- Study Conclusions  - Left ventricle: The cavity size was normal. Wall thickness was   increased in a pattern of moderate LVH. Systolic function was   normal. The estimated ejection fraction was in the range of 60%   to 65%. Wall motion was normal; there were no regional wall   motion abnormalities. Indeterminate diastolic function. - Aortic valve: Moderately calcified annulus. Trileaflet; mildly   calcified leaflets. - Mitral valve: Moderately calcified annulus. There was mild   regurgitation. - Left atrium: The atrium was mildly dilated. - Right atrium: Central venous pressure (est): 15 mm Hg. - Atrial septum: No defect  or patent foramen ovale was identified. - Tricuspid valve: There was trivial regurgitation. - Pulmonary arteries: Systolic  pressure could not be accurately   estimated. - Pericardium, extracardiac: A prominent pericardial fat pad was   present.   Left heart catheterization 9/20  Prox RCA to Mid RCA lesion is 90% stenosed.  Dist RCA lesion is 80% stenosed.  Mid RCA lesion is 80% stenosed.  Acute Mrg lesion is 40% stenosed.  Mid LM lesion is 55% stenosed.  Ost Cx lesion is 90% stenosed.  Prox Cx lesion is 85% stenosed.  Ost LAD to Prox LAD lesion is 50% stenosed.   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 There is  55% focal distal left main stenosis, calcification of the proximal LAD with 50% stenosis, 90% ostial left circumflex stenosis followed by 85% calcified proximal stenosis; and a large dominant RCA with severe eccentric calcification with 90% stenosis proximally, 80% stenosis in the region of the crux and 80% stenosis prior to the PDA takeoff with 40% marginal/PDA branch stenosis.  Preserved global LV contractility with inferior hypocontractility and ejection fraction of approximately 55%.  LVEDP 17 mm Hg.    RECOMMENDATION: Surgical consultation for CABG revascularization.  The patient will be started on heparin therapy 8 hours post sheath removal.  She will also be started on IV nitroglycerin.  I have discussed the catheterization findings with Dr. Servando Snare.  She has been documented to have intermittent second-degree type I block. She will transported to [facility] for further evaluation and treatment prior to subsequent CABG revascularization. Recommend Aspirin 81mg  daily and will not start DAPT  due to need for surgery.   Carotid US and bilateral ABI 9/22 Final Interpretation: Right Carotid: Velocities in the right ICA are consistent with a 1-39% stenosis.  Left Carotid: Velocities in the left ICA are consistent with a 1-39% stenosis.  Right ABI: Resting right ankle-brachial index is within normal range. No evidence of significant right lower extremity arterial  disease. Left ABI: Resting left ankle-brachial index is within normal range. No evidence of significant left lower extremity arterial disease.  Right Upper Extremity: Allen's test not performed due to TR band. Left Upper Extremity: Doppler waveform obliterate with left radial compression. Doppler waveforms remain within normal limits with left ulnar compression.    Left heart catheterization 06/11/2018   Antimicrobials:   None    Subjective: No chest pain.  Dyspnea, present, mild.  No groin bleeding, no leg swelling, no abdominal pain, fever, cough, sputum.  Vision field deficit still present, no other focal neurological deficits.         Objective: Vitals:   06/14/18 0000 06/14/18 0025 06/14/18 0500 06/14/18 0652  BP:    (!) 141/47  Pulse: 60  (!) 42 63  Resp:      Temp:      TempSrc:      SpO2: 97% 96% 98% 99%  Weight:      Height:        Intake/Output Summary (Last 24 hours) at 06/14/2018 1101 Last data filed at 06/14/2018 0035 Gross per 24 hour  Intake 546 ml  Output 1000 ml  Net -454 ml   Filed Weights   06/05/18 1454 06/05/18 1835 06/06/18 1513  Weight: 126.7 kg 125.6 kg 122.4 kg    Examination: General appearance: obese adult female, lying in bed, interactive   HEENT: Anicteric, conjunctiva pink, lids and lashes normal. No nasal deformity, discharge, epistaxis.  Lips moist, dentition normal, no oral lesions, OP moist, hearing  normal.   Skin: Warm and dry.  no jaundice.  No suspicious rashes or lesions. Cardiac: RRR, soft systolic murmur, JVP not visible, no LE edema.    Respiratory: Normal RR, non-labored, no rales or wheezes, diminihsed thoguhtout due to habitus Abdomen: Abdomen soft without focal tenderness to palpatioin, distension. MSK: No deformities or effusions. Neuro: Awake and alert.  Visual field defect in right eye, otherwise, CNs normal, EOMI, moves all extremities with equal strength and coordination. Speech fluent.    Psych: Sensorium intact  and responding to questions, attention normal, judgment appears normal.   Data Reviewed: I have personally reviewed following labs and imaging studies:  CBC: Recent Labs  Lab 06/09/18 0923 06/10/18 0634 06/11/18 0237 06/12/18 0526 06/13/18 0411  WBC 10.5 10.5 10.1 10.5 11.5*  HGB 10.5* 9.4* 9.5* 10.5* 10.3*  HCT 35.3* 32.0* 32.2* 35.4* 34.8*  MCV 83.5 83.3 82.8 82.7 83.9  PLT 215 191 183 222 PLATELET CLUMPS NOTED ON SMEAR, UNABLE TO ESTIMATE   Basic Metabolic Panel: Recent Labs  Lab 06/11/18 0746 06/12/18 0526 06/13/18 0411 06/14/18 0537  NA 140 141 137 136  K 4.9 4.4 4.5 4.2  CL 102 106 102 103  CO2 24 28 24 25   GLUCOSE 176* 129* 167* 202*  BUN 24* 20 30* 35*  CREATININE 1.26* 1.27* 1.82* 2.15*  CALCIUM 9.4 9.1 9.1 8.8*   GFR: Estimated Creatinine Clearance: 33.8 mL/min (A) (by C-G formula based on SCr of 2.15 mg/dL (H)). Liver Function Tests: No results for input(s): AST, ALT, ALKPHOS, BILITOT, PROT, ALBUMIN in the last 168 hours. No results for input(s): LIPASE, AMYLASE in the last 168 hours. No results for input(s): AMMONIA in the last 168 hours. Coagulation Profile: No results for input(s): INR, PROTIME in the last 168 hours. Cardiac Enzymes: No results for input(s): CKTOTAL, CKMB, CKMBINDEX, TROPONINI in the last 168 hours. BNP (last 3 results) No results for input(s): PROBNP in the last 8760 hours. HbA1C: No results for input(s): HGBA1C in the last 72 hours. CBG: Recent Labs  Lab 06/13/18 0740 06/13/18 1125 06/13/18 1615 06/13/18 2128 06/14/18 0747  GLUCAP 180* 199* 195* 194* 170*   Lipid Profile: No results for input(s): CHOL, HDL, LDLCALC, TRIG, CHOLHDL, LDLDIRECT in the last 72 hours. Thyroid Function Tests: No results for input(s): TSH, T4TOTAL, FREET4, T3FREE, THYROIDAB in the last 72 hours. Anemia Panel: No results for input(s): VITAMINB12, FOLATE, FERRITIN, TIBC, IRON, RETICCTPCT in the last 72 hours. Urine analysis:    Component Value  Date/Time   COLORURINE YELLOW 09/12/2017 1047   APPEARANCEUR Clear 02/25/2018 1050   LABSPEC 1.017 09/12/2017 1047   PHURINE 5.0 09/12/2017 1047   GLUCOSEU Negative 02/25/2018 1050   HGBUR SMALL (A) 09/12/2017 1047   BILIRUBINUR Negative 02/25/2018 1050   KETONESUR NEGATIVE 09/12/2017 1047   PROTEINUR 3+ (A) 02/25/2018 1050   PROTEINUR >=300 (A) 09/12/2017 1047   UROBILINOGEN negative 11/14/2015 0949   NITRITE Negative 02/25/2018 1050   NITRITE NEGATIVE 09/12/2017 1047   LEUKOCYTESUR Negative 02/25/2018 1050   Sepsis Labs: @LABRCNTIP (procalcitonin:4,lacticacidven:4)  ) Recent Results (from the past 240 hour(s))  MRSA PCR Screening     Status: None   Collection Time: 06/06/18  5:47 PM  Result Value Ref Range Status   MRSA by PCR NEGATIVE NEGATIVE Final    Comment:        The GeneXpert MRSA Assay (FDA approved for NASAL specimens only), is one component of a comprehensive MRSA colonization surveillance program. It is not intended to diagnose MRSA  infection nor to guide or monitor treatment for MRSA infections. Performed at [facility], [place] [address] [place], [place] [address]          Radiology Studies: No results found.      Scheduled Meds: . acyclovir  400 mg Oral Daily  . amLODipine  10 mg Oral Daily  . aspirin EC  81 mg Oral Daily  . clopidogrel  75 mg Oral Daily  . enoxaparin (LOVENOX) injection  30 mg Subcutaneous Q24H  . fluticasone  2 spray Each Nare Daily  . hydrALAZINE  25 mg Oral BID  . HYDROcodone-acetaminophen  1 tablet Oral QHS  . insulin aspart  0-5 Units Subcutaneous QHS  . insulin aspart  0-9 Units Subcutaneous TID WC  . insulin aspart  15 Units Subcutaneous TID WC  . insulin glargine  25 Units Subcutaneous BID  . isosorbide mononitrate  30 mg Oral Daily  . levothyroxine  100 mcg Oral QAC breakfast  . mouth rinse  15 mL Mouth Rinse BID  . montelukast  10 mg Oral QHS  . pantoprazole  80 mg Oral Daily  . rosuvastatin  40 mg  Oral QHS  . sodium chloride flush  3 mL Intravenous Q12H  . sodium chloride flush  3 mL Intravenous Q12H   Continuous Infusions: . sodium chloride 50 mL/hr at 06/13/18 1456     LOS: 8 days

## 2018-06-15 ENCOUNTER — Other Ambulatory Visit: Payer: Self-pay | Admitting: Physician Assistant

## 2018-06-15 DIAGNOSIS — I214 Non-ST elevation (NSTEMI) myocardial infarction: Secondary | ICD-10-CM

## 2018-06-15 LAB — BASIC METABOLIC PANEL
Anion gap: 8 (ref 5–15)
BUN: 35 mg/dL — ABNORMAL HIGH (ref 8–23)
CHLORIDE: 106 mmol/L (ref 98–111)
CO2: 25 mmol/L (ref 22–32)
Calcium: 9 mg/dL (ref 8.9–10.3)
Creatinine, Ser: 1.96 mg/dL — ABNORMAL HIGH (ref 0.44–1.00)
GFR calc Af Amer: 29 mL/min — ABNORMAL LOW (ref >60)
GFR calc non Af Amer: 25 mL/min — ABNORMAL LOW (ref >60)
Glucose, Bld: 157 mg/dL — ABNORMAL HIGH (ref 70–99)
POTASSIUM: 4.4 mmol/L (ref 3.5–5.1)
Sodium: 139 mmol/L (ref 135–145)

## 2018-06-15 LAB — GLUCOSE, CAPILLARY
GLUCOSE-CAPILLARY: 184 mg/dL — AB (ref 70–99)
Glucose-Capillary: 151 mg/dL — ABNORMAL HIGH (ref 70–99)
Glucose-Capillary: 265 mg/dL — ABNORMAL HIGH (ref 70–99)
Glucose-Capillary: 227 mg/dL — ABNORMAL HIGH (ref 70–99)

## 2018-06-15 MED ORDER — HYDRALAZINE HCL 10 MG PO TABS
10.0000 mg | ORAL_TABLET | Freq: Once | ORAL | Status: AC
  Administered 2018-06-15: 10 mg via ORAL
  Filled 2018-06-15: qty 1

## 2018-06-15 MED ORDER — LOSARTAN POTASSIUM 50 MG PO TABS
100.0000 mg | ORAL_TABLET | Freq: Every day | ORAL | Status: DC
  Administered 2018-06-16 – 2018-06-19 (×4): 100 mg via ORAL
  Filled 2018-06-15 (×4): qty 2

## 2018-06-15 MED ORDER — FUROSEMIDE 20 MG PO TABS
20.0000 mg | ORAL_TABLET | Freq: Every day | ORAL | Status: DC
  Administered 2018-06-15 – 2018-06-19 (×5): 20 mg via ORAL
  Filled 2018-06-15 (×5): qty 1

## 2018-06-15 NOTE — Progress Notes (Signed)
PROGRESS NOTE  Mary Raymond NWG:[id number] DOB: 1950-10-15 DOA: 06/05/2018 [name], FNP  HPI/Recap of past 24 hours: Mary Raymond is a 67 y.o. F with fibromyalgia, morbid obesity, HTN, DM who prsetned with chest pain, found in the ER to have elevated troponin.     06/15/2018: Patient seen and examined at bedside.  She reports dyspnea with minimal exertion and dizziness upon standing.  Denies any chest pain or palpitations.  Cardiology followed and signed off.  Appreciate recommendations.  Assessment/Plan: Principal Problem:   Chest pain Active Problems:   Hypothyroidism   Hypertension associated with diabetes ([place])   Hyperlipidemia associated with type 2 diabetes mellitus ([place])   Obesity, morbid, BMI 40.0-49.9 (HCC)   Elevated troponin   NSTEMI (non-ST elevated myocardial infarction) ([place])   Cerebral embolism with cerebral infarction  NSTEMI LHC 9/20 showed 3 vessel disease in diabetic.  Evaluated by CT surgery, who recommended not to do coronary bypass grafting due to extensive comorbidities, debility and acute CVA.  Went back to cath lab and had PCI x 2 to RCA, no intervention to LCx which was tight ostial lesion due to backing up into L main and too complex to correct. Medical Rx.  LAD w/o any tight lesions.  Continue aspirin and Plavix x12 months Continue Crestor Resume losartan tomorrow 06/16/2018 as recommended by cardiology Normal LVEF on 2D echo Follow-up with cardiology outpatient; follow cardiology recommendations from 06/15/2018 progress note  Second-degree AV block Avoid AV nodal blocking agents Continue to monitor on telemetry  Acute left occipital lobe CVA On aspirin Plavix and Crestor Continue physical therapy PT recommend SNF Fall precautions  AKI on CKD 3 Baseline creatinine 1.2 with GFR 43 Creatinine improving 1.96 with GFR of 25 from creatinine 2.15 yesterday Continue to avoid nephrotoxic agents/dehydration/hypotension Monitor urine  output Repeat BMP in the morning Continue physical therapy  Ambulatory dysfunction/dizziness Suspect secondary to acute left occipital CVA Obtain orthostatic vital signs Continue PT Fall precautions    Code Status: Full code  Family Communication: None at bedside  Disposition Plan: SNF possibly tomorrow 06/16/2018   Consultants:  Cardiology  Procedures:  Left heart cath  Antimicrobials:  None  DVT prophylaxis: Subcu Lovenox daily   Objective: Vitals:   06/15/18 0338 06/15/18 0815 06/15/18 0958 06/15/18 1328  BP: (!) 157/52  (!) 159/55 (!) 150/49  Pulse: (!) 44 (!) 55  (!) 34  Resp: 16 18    Temp: 97.6 F (36.4 C)   98.1 F (36.7 C)  TempSrc: Oral   Oral  SpO2: 97% 95%  97%  Weight:      Height:        Intake/Output Summary (Last 24 hours) at 06/15/2018 1542 Last data filed at 06/15/2018 1300 Gross per 24 hour  Intake 483 ml  Output 740 ml  Net -257 ml   Filed Weights   06/05/18 1835 06/06/18 1513 06/15/18 0332  Weight: 125.6 kg 122.4 kg 121.8 kg    Exam:  . General: 67 y.o. year-old female well developed well nourished in no acute distress.  Alert and oriented x3. . Cardiovascular: Regular rate and rhythm with no rubs or gallops.  No thyromegaly or JVD noted.   Marland Kitchen Respiratory: Clear to auscultation with no wheezes or rales. Good inspiratory effort. . Abdomen: Soft nontender nondistended with normal bowel sounds x4 quadrants. . Musculoskeletal: No lower extremity edema. 2/4 pulses in all 4 extremities. Marland Kitchen Psychiatry: Mood is appropriate for condition and setting   Data Reviewed: CBC:  Recent Labs  Lab 06/09/18 0923 06/10/18 0634 06/11/18 0237 06/12/18 0526 06/13/18 0411  WBC 10.5 10.5 10.1 10.5 11.5*  HGB 10.5* 9.4* 9.5* 10.5* 10.3*  HCT 35.3* 32.0* 32.2* 35.4* 34.8*  MCV 83.5 83.3 82.8 82.7 83.9  PLT 215 191 183 222 PLATELET CLUMPS NOTED ON SMEAR, UNABLE TO ESTIMATE   Basic Metabolic Panel: Recent Labs  Lab 06/11/18 0746  06/12/18 0526 06/13/18 0411 06/14/18 0537 06/15/18 0347  NA 140 141 137 136 139  K 4.9 4.4 4.5 4.2 4.4  CL 102 106 102 103 106  CO2 24 28 24 25 25   GLUCOSE 176* 129* 167* 202* 157*  BUN 24* 20 30* 35* 35*  CREATININE 1.26* 1.27* 1.82* 2.15* 1.96*  CALCIUM 9.4 9.1 9.1 8.8* 9.0   GFR: Estimated Creatinine Clearance: 37 mL/min (A) (by C-G formula based on SCr of 1.96 mg/dL (H)). Liver Function Tests: No results for input(s): AST, ALT, ALKPHOS, BILITOT, PROT, ALBUMIN in the last 168 hours. No results for input(s): LIPASE, AMYLASE in the last 168 hours. No results for input(s): AMMONIA in the last 168 hours. Coagulation Profile: No results for input(s): INR, PROTIME in the last 168 hours. Cardiac Enzymes: No results for input(s): CKTOTAL, CKMB, CKMBINDEX, TROPONINI in the last 168 hours. BNP (last 3 results) No results for input(s): PROBNP in the last 8760 hours. HbA1C: No results for input(s): HGBA1C in the last 72 hours. CBG: Recent Labs  Lab 06/14/18 1124 06/14/18 1635 06/14/18 2124 06/15/18 0727 06/15/18 1125  GLUCAP 202* 186* 243* 151* 265*   Lipid Profile: No results for input(s): CHOL, HDL, LDLCALC, TRIG, CHOLHDL, LDLDIRECT in the last 72 hours. Thyroid Function Tests: No results for input(s): TSH, T4TOTAL, FREET4, T3FREE, THYROIDAB in the last 72 hours. Anemia Panel: No results for input(s): VITAMINB12, FOLATE, FERRITIN, TIBC, IRON, RETICCTPCT in the last 72 hours. Urine analysis:    Component Value Date/Time   COLORURINE YELLOW 09/12/2017 1047   APPEARANCEUR Clear 02/25/2018 1050   LABSPEC 1.017 09/12/2017 1047   PHURINE 5.0 09/12/2017 1047   GLUCOSEU Negative 02/25/2018 1050   HGBUR SMALL (A) 09/12/2017 1047   BILIRUBINUR Negative 02/25/2018 1050   KETONESUR NEGATIVE 09/12/2017 1047   PROTEINUR 3+ (A) 02/25/2018 1050   PROTEINUR >=300 (A) 09/12/2017 1047   UROBILINOGEN negative 11/14/2015 0949   NITRITE Negative 02/25/2018 1050   NITRITE NEGATIVE  09/12/2017 1047   LEUKOCYTESUR Negative 02/25/2018 1050   Sepsis Labs: @LABRCNTIP (procalcitonin:4,lacticidven:4)  ) Recent Results (from the past 240 hour(s))  MRSA PCR Screening     Status: None   Collection Time: 06/06/18  5:47 PM  Result Value Ref Range Status   MRSA by PCR NEGATIVE NEGATIVE Final    Comment:        The GeneXpert MRSA Assay (FDA approved for NASAL specimens only), is one component of a comprehensive MRSA colonization surveillance program. It is not intended to diagnose MRSA infection nor to guide or monitor treatment for MRSA infections. Performed at [facility], [place] [address] [place], [place] [address]       Studies: No results found.  Scheduled Meds: . acyclovir  400 mg Oral Daily  . amLODipine  10 mg Oral Daily  . aspirin EC  81 mg Oral Daily  . clopidogrel  75 mg Oral Daily  . enoxaparin (LOVENOX) injection  30 mg Subcutaneous Q24H  . fluticasone  2 spray Each Nare Daily  . furosemide  20 mg Oral Daily  . hydrALAZINE  25 mg Oral  BID  . HYDROcodone-acetaminophen  1 tablet Oral QHS  . insulin aspart  0-5 Units Subcutaneous QHS  . insulin aspart  0-9 Units Subcutaneous TID WC  . insulin aspart  15 Units Subcutaneous TID WC  . insulin glargine  25 Units Subcutaneous BID  . isosorbide mononitrate  30 mg Oral Daily  . levothyroxine  100 mcg Oral QAC breakfast  . [START ON 06/16/2018] losartan  100 mg Oral Daily  . mouth rinse  15 mL Mouth Rinse BID  . montelukast  10 mg Oral QHS  . pantoprazole  80 mg Oral Daily  . rosuvastatin  40 mg Oral QHS  . sodium chloride flush  3 mL Intravenous Q12H  . sodium chloride flush  3 mL Intravenous Q12H    Continuous Infusions:   LOS: 9 days     Kayleen Memos, MD [facility] Pager [phone]  If 7PM-7AM, please contact night-coverage [email or web address] Password [facility] 06/15/2018, 3:42 PM

## 2018-06-15 NOTE — Progress Notes (Addendum)
Progress Note  Patient Name: Mary Raymond Date of Encounter: 06/15/2018  Primary Cardiologist: Carlyle Dolly, MD   Subjective   C/O dyspnea this AM that she attributes to asthma; no CP  Inpatient Medications    Scheduled Meds: . acyclovir  400 mg Oral Daily  . amLODipine  10 mg Oral Daily  . aspirin EC  81 mg Oral Daily  . clopidogrel  75 mg Oral Daily  . enoxaparin (LOVENOX) injection  30 mg Subcutaneous Q24H  . fluticasone  2 spray Each Nare Daily  . hydrALAZINE  25 mg Oral BID  . HYDROcodone-acetaminophen  1 tablet Oral QHS  . insulin aspart  0-5 Units Subcutaneous QHS  . insulin aspart  0-9 Units Subcutaneous TID WC  . insulin aspart  15 Units Subcutaneous TID WC  . insulin glargine  25 Units Subcutaneous BID  . isosorbide mononitrate  30 mg Oral Daily  . levothyroxine  100 mcg Oral QAC breakfast  . mouth rinse  15 mL Mouth Rinse BID  . montelukast  10 mg Oral QHS  . pantoprazole  80 mg Oral Daily  . rosuvastatin  40 mg Oral QHS  . sodium chloride flush  3 mL Intravenous Q12H  . sodium chloride flush  3 mL Intravenous Q12H   Continuous Infusions:  PRN Meds: acetaminophen, albuterol, ALPRAZolam, gi cocktail, ipratropium-albuterol, LORazepam, morphine injection, ondansetron (ZOFRAN) IV, sodium chloride flush, sodium chloride flush   Vital Signs    Vitals:   06/14/18 2017 06/14/18 2204 06/15/18 0332 06/15/18 0338  BP: (!) 139/47 (!) 165/58  (!) 157/52  Pulse: (!) 54 (!) 45  (!) 44  Resp: 18   16  Temp: 98.1 F (36.7 C)   97.6 F (36.4 C)  TempSrc: Oral   Oral  SpO2: 97%   97%  Weight:   121.8 kg   Height:        Intake/Output Summary (Last 24 hours) at 06/15/2018 0743 Last data filed at 06/15/2018 0400 Gross per 24 hour  Intake 243 ml  Output 500 ml  Net -257 ml   Filed Weights   06/05/18 1835 06/06/18 1513 06/15/18 0332  Weight: 125.6 kg 122.4 kg 121.8 kg    Telemetry    Sinus with mobitz 1, 2:1 AV block and occasional junctional  escape beat unchanged- Personally Reviewed  Physical Exam   GEN: No acute distress.  NAD Neck: No JVD, supple Cardiac: RRR, 2/6 systolic murmur LSB Respiratory: Clear to auscultation bilaterally; no wheeze GI: Soft, NT/ND MS: No edema, no chords Neuro:  Grossly intact  Labs    Chemistry Recent Labs  Lab 06/13/18 0411 06/14/18 0537 06/15/18 0347  NA 137 136 139  K 4.5 4.2 4.4  CL 102 103 106  CO2 24 25 25   GLUCOSE 167* 202* 157*  BUN 30* 35* 35*  CREATININE 1.82* 2.15* 1.96*  CALCIUM 9.1 8.8* 9.0  GFRNONAA 28* 23* 25*  GFRAA 32* 26* 29*  ANIONGAP 11 8 8      Hematology Recent Labs  Lab 06/11/18 0237 06/12/18 0526 06/13/18 0411  WBC 10.1 10.5 11.5*  RBC 3.89 4.28 4.15  HGB 9.5* 10.5* 10.3*  HCT 32.2* 35.4* 34.8*  MCV 82.8 82.7 83.9  MCH 24.4* 24.5* 24.8*  MCHC 29.5* 29.7* 29.6*  RDW 16.6* 16.9* 17.2*  PLT 183 222 PLATELET CLUMPS NOTED ON SMEAR, UNABLE TO ESTIMATE     Patient Profile   67 y.o.femalewith past medical history of HTN, HLD, and type II DM who presented  to [facility] ED on 06/05/2018 for evaluation of chest pain. Troponin value elevated to 0.34. Underwent cath with multivessel disease. Felt not to be a candidate for CABG and planned for PCI. Subsequently underwent PCI of RCA.  Echocardiogram this admission showed normal LV function, mild mitral regurgitation, mild left atrial enlargement.  Assessment & Plan    1 coronary artery disease-Pt denies CP. He is status post PCI of RCA.  Residual circumflex disease treated medically.  Continue aspirin, Plavix and statin.  Continue amlodipine and isosorbide.  No beta-blocker given Mobitz 1 second-degree AV block.  2 acute on chronic stage III kidney disease-renal function improving this morning.  Likely component of contrast nephropathy.  Will resume Lasix 20 mg p.o. daily.  3 hyperlipidemia-continue statin.  4 CVA-continue aspirin and Plavix.  Continue statin.  5 hypertension-blood pressure  elevated.  Renal function improving.  Resume Lasix 20 mg daily.  Can likely resume Cozaar at home dose tomorrow morning.  Patient can be discharged from a cardiac standpoint.  She will need follow-up with Dr. Harl Bowie in [place].  [facility] will sign off.   Medication Recommendations: Continue present medications as outlined on MAR.  Resume Cozaar 100 mg daily tomorrow. Other recommendations (labs, testing, etc): Check potassium and renal function 1 week following discharge. Follow up as an outpatient: Transition of care appointment in [place] with APP 1 week.  Follow-up Dr. Harl Bowie 3 months.  For questions or updates, please contact [place] Please consult [email or web address] for contact info under        Signed, Kirk Ruths, MD  06/15/2018, 7:43 AM

## 2018-06-16 ENCOUNTER — Ambulatory Visit: Payer: Medicare HMO | Admitting: Family

## 2018-06-16 DIAGNOSIS — E1165 Type 2 diabetes mellitus with hyperglycemia: Secondary | ICD-10-CM

## 2018-06-16 LAB — CBC
HEMATOCRIT: 32.7 % — AB (ref 36.0–46.0)
HEMOGLOBIN: 9.5 g/dL — AB (ref 12.0–15.0)
MCH: 24.4 pg — AB (ref 26.0–34.0)
MCHC: 29.1 g/dL — ABNORMAL LOW (ref 30.0–36.0)
MCV: 83.8 fL (ref 78.0–100.0)
Platelets: 222 10*3/uL (ref 150–400)
RBC: 3.9 MIL/uL (ref 3.87–5.11)
RDW: 16.6 % — ABNORMAL HIGH (ref 11.5–15.5)
WBC: 12.1 10*3/uL — ABNORMAL HIGH (ref 4.0–10.5)

## 2018-06-16 LAB — GLUCOSE, CAPILLARY
GLUCOSE-CAPILLARY: 232 mg/dL — AB (ref 70–99)
Glucose-Capillary: 171 mg/dL — ABNORMAL HIGH (ref 70–99)
Glucose-Capillary: 224 mg/dL — ABNORMAL HIGH (ref 70–99)
Glucose-Capillary: 200 mg/dL — ABNORMAL HIGH (ref 70–99)

## 2018-06-16 LAB — BASIC METABOLIC PANEL
Anion gap: 9 (ref 5–15)
BUN: 33 mg/dL — ABNORMAL HIGH (ref 8–23)
CHLORIDE: 105 mmol/L (ref 98–111)
CO2: 25 mmol/L (ref 22–32)
Calcium: 9.2 mg/dL (ref 8.9–10.3)
Creatinine, Ser: 1.7 mg/dL — ABNORMAL HIGH (ref 0.44–1.00)
GFR calc non Af Amer: 30 mL/min — ABNORMAL LOW (ref >60)
GFR, EST AFRICAN AMERICAN: 35 mL/min — AB (ref >60)
Glucose, Bld: 175 mg/dL — ABNORMAL HIGH (ref 70–99)
Potassium: 4.4 mmol/L (ref 3.5–5.1)
Sodium: 139 mmol/L (ref 135–145)

## 2018-06-16 MED ORDER — INSULIN GLARGINE 100 UNIT/ML SOLOSTAR PEN: 25.0000 [IU] | PEN_INJECTOR | Freq: Two times a day (BID) | SUBCUTANEOUS | 0 refills | Status: DC

## 2018-06-16 MED ORDER — INSULIN ASPART 100 UNIT/ML ~~LOC~~ SOLN: SUBCUTANEOUS | 11 refills | Status: AC

## 2018-06-16 MED ORDER — CLOPIDOGREL BISULFATE 75 MG PO TABS: 75.0000 mg | ORAL_TABLET | Freq: Every day | ORAL | Status: DC

## 2018-06-16 MED ORDER — LOSARTAN POTASSIUM 50 MG PO TABS: 100.0000 mg | ORAL_TABLET | Freq: Every day | ORAL | Status: DC

## 2018-06-16 MED ORDER — ALPRAZOLAM 0.5 MG PO TABS: ORAL_TABLET | ORAL | 0 refills | Status: DC

## 2018-06-16 MED ORDER — HYDRALAZINE HCL 25 MG PO TABS: 25.0000 mg | ORAL_TABLET | Freq: Two times a day (BID) | ORAL | Status: DC

## 2018-06-16 MED ORDER — ROSUVASTATIN CALCIUM 20 MG PO TABS: 40.0000 mg | ORAL_TABLET | Freq: Every day | ORAL | 11 refills | Status: DC

## 2018-06-16 MED ORDER — ISOSORBIDE MONONITRATE ER 30 MG PO TB24: 30.0000 mg | ORAL_TABLET | Freq: Every day | ORAL | Status: DC

## 2018-06-16 MED ORDER — ASPIRIN 81 MG PO TBEC: 81.0000 mg | DELAYED_RELEASE_TABLET | Freq: Every day | ORAL | Status: AC

## 2018-06-16 MED ORDER — MAGNESIUM HYDROXIDE NICU ORAL SYRINGE 400 MG/5 ML: 30.0000 mL | Freq: Every day | ORAL | Status: DC | PRN

## 2018-06-16 MED ORDER — MAGNESIUM HYDROXIDE 400 MG/5ML PO SUSP
30.0000 mL | Freq: Every day | ORAL | Status: DC | PRN
  Administered 2018-06-16 – 2018-06-19 (×3): 30 mL via ORAL
  Filled 2018-06-16 (×3): qty 30

## 2018-06-16 MED ORDER — HYDROCODONE-ACETAMINOPHEN 7.5-325 MG PO TABS: 1.0000 | ORAL_TABLET | Freq: Every day | ORAL | 0 refills | Status: DC

## 2018-06-16 NOTE — Discharge Summary (Signed)
PATIENT DETAILS Name: Mary Raymond Age: 67 y.o. Sex: female Date of Birth: May 03, 1951 MRN: [id number]. Admitting Physician: Shelbie Proctor, MD [id number], Theador Hawthorne, FNP  Admit Date: 06/05/2018 Discharge date: 06/16/2018  Recommendations for Outpatient Follow-up:  1. Follow up with PCP in 1-2 weeks 2. Please obtain BMP/CBC in one week 3. Please ensure follow-up with cardiology 4. 30-day event monitor as an outpatient per neurology recommendations  Admitted From:  SNF  Disposition: SNF   Home Health: No  Equipment/Devices: None  Discharge Condition: Stable  CODE STATUS: FULL CODE  Diet recommendation:  Heart Healthy / Carb Modified  Brief Summary: See H&P, Labs, Consult and Test reports for all details in brief, Mary Raymond is a 67 y.o. F with fibromyalgia, morbid obesity, HTN, DM who prsetned with chest pain, found have  NSTEMI.  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  Hospital course complicated by a small left PCA infarct-found on MRI on 9/22 after patient complained of decreased vision in the left eye.  Brief Hospital Course: Non-STEMI: Chest pain-free, LHC on 9/20 showed three-vessel disease, not felt to be a candidate for CABG by CT surgery, subsequently went back to the Cath Lab and had PCI to RCA.  We will continue with aspirin/Plavix and statin on discharge.  Not on beta-blocker given Mobitz 1 second-degree heart block.  Please ensure follow-up with cardiology in the next week or so.  Acute on chronic kidney disease stage III: Suspect acute kidney injury probably a component of contrast nephropathy, creatinine levels improving and now close to usual baseline.  Lasix 20 mg p.o. daily has been resumed  Acute left PCA stroke: Evaluated by neurology-underwent work-up-CTA head/neck did not show any significant stenosis, echocardiogram showed preserved EF without any embolic source.  Remains on aspirin/Plavix and  statin.  Please ensure outpatient follow-up with neurology.  Neurology also recommending a 30-day cardiac event monitoring as outpatient to rule out A. Fib.  Dyslipidemia: Continue statin but Lipitor changed to 40 mg.  Hypertension: Blood pressure fluctuating but relatively better than the past 2 days-continue amlodipine, Imdur, hydralazine and losartan.  Optimize in the outpatient setting.  DM-2: Continue Lantus 25 units twice daily and SSI-follow CBGs and optimize further in the outpatient setting.  Ambulatory dysfunction/dizziness: May be secondary to CVA-continue PT at SNF  Procedures/Studies: 9/20 >>LHC:  Prox RCA to Mid RCA lesion is 90% stenosed.  Dist RCA lesion is 80% stenosed.  Mid RCA lesion is 80% stenosed.  Acute Mrg lesion is 40% stenosed.  Mid LM lesion is 55% stenosed.  Ost Cx lesion is 90% stenosed.  Prox Cx lesion is 85% stenosed.  Ost LAD to Prox LAD lesion is 50% stenosed.  9/25>> PCI to proximal and mid RCA  Discharge Diagnoses:  Principal Problem:   Chest pain Active Problems:   Hypothyroidism   Hypertension associated with diabetes ([place])   Hyperlipidemia associated with type 2 diabetes mellitus (HCC)   Obesity, morbid, BMI 40.0-49.9 (HCC)   Elevated troponin   NSTEMI (non-ST elevated myocardial infarction) ([place])   Cerebral embolism with cerebral infarction   Discharge Instructions:  Activity:  As tolerated with Full fall precautions use walker/cane & assistance as needed   Discharge Instructions    Amb Referral to Cardiac Rehabilitation   Complete by:  As directed    Diagnosis:   NSTEMI Coronary Stents     Diet - low sodium heart healthy   Complete by:  As directed  Diet Carb Modified   Complete by:  As directed    Discharge instructions   Complete by:  As directed    Follow with Primary MD  [name], FNP in 1 week  Follow with Cardiology as instructed  Please get a complete blood count and chemistry panel checked by  your Primary MD at your next visit, and again as instructed by your Primary MD.  Get Medicines reviewed and adjusted: Please take all your medications with you for your next visit with your Primary MD  Laboratory/radiological data: Please request your Primary MD to go over all hospital tests and procedure/radiological results at the follow up, please ask your Primary MD to get all Hospital records sent to his/her office.  In some cases, they will be blood work, cultures and biopsy results pending at the time of your discharge. Please request that your primary care M.D. follows up on these results.  Also Note the following: If you experience worsening of your admission symptoms, develop shortness of breath, life threatening emergency, suicidal or homicidal thoughts you must seek medical attention immediately by calling 911 or calling your MD immediately  if symptoms less severe.  You must read complete instructions/literature along with all the possible adverse reactions/side effects for all the Medicines you take and that have been prescribed to you. Take any new Medicines after you have completely understood and accpet all the possible adverse reactions/side effects.   Do not drive when taking Pain medications or sleeping medications (Benzodaizepines)  Do not take more than prescribed Pain, Sleep and Anxiety Medications. It is not advisable to combine anxiety,sleep and pain medications without talking with your primary care practitioner  Special Instructions: If you have smoked or chewed Tobacco  in the last 2 yrs please stop smoking, stop any regular Alcohol  and or any Recreational drug use.  Wear Seat belts while driving.  Please note: You were cared for by a hospitalist during your hospital stay. Once you are discharged, your primary care physician will handle any further medical issues. Please note that NO REFILLS for any discharge medications will be authorized once you are discharged,  as it is imperative that you return to your primary care physician (or establish a relationship with a primary care physician if you do not have one) for your post hospital discharge needs so that they can reassess your need for medications and monitor your lab values.   Increase activity slowly   Complete by:  As directed      Allergies as of 06/16/2018      Reactions   Atorvastatin Other (See Comments)   Leg weakness.      Medication List    STOP taking these medications   metFORMIN 500 MG tablet Commonly known as:  GLUCOPHAGE   metoprolol succinate 25 MG 24 hr tablet Commonly known as:  TOPROL-XL   NOVOLOG FLEXPEN 100 UNIT/ML FlexPen Generic drug:  insulin aspart Replaced by:  insulin aspart 100 UNIT/ML injection     TAKE these medications   ACCU-CHEK SOFTCLIX LANCETS lancets Test BG TID   acyclovir 400 MG tablet Commonly known as:  ZOVIRAX TAKE 1 TABLET EVERY DAY   albuterol 108 (90 Base) MCG/ACT inhaler Commonly known as:  PROVENTIL HFA;VENTOLIN HFA Inhale 1-2 puffs into the lungs every 6 (six) hours as needed for wheezing or shortness of breath.   ALPRAZolam 0.5 MG tablet Commonly known as:  XANAX TAKE 1 TABLET 2 TIMES A DAY AS NEEDED FOR ANXIETY  amLODipine 10 MG tablet Commonly known as:  NORVASC TAKE 1 TABLET EVERY DAY   aspirin 81 MG EC tablet Take 1 tablet (81 mg total) by mouth daily. Start taking on:  06/17/2018   B-D UF III MINI PEN NEEDLES 31G X 5 MM Misc Generic drug:  Insulin Pen Needle USE AS INSTRUCTED TO GIVE LANTUS INJECTION TWICE DAILY   clopidogrel 75 MG tablet Commonly known as:  PLAVIX Take 1 tablet (75 mg total) by mouth daily. Start taking on:  06/17/2018   fluticasone 50 MCG/ACT nasal spray Commonly known as:  FLONASE USE 2 SPRAYS IN EACH NOSTRIL EVERY DAY   furosemide 20 MG tablet Commonly known as:  LASIX TAKE 1 TABLET (20 MG TOTAL) BY MOUTH DAILY.   hydrALAZINE 25 MG tablet Commonly known as:  APRESOLINE Take 1 tablet  (25 mg total) by mouth 2 (two) times daily.   HYDROcodone-acetaminophen 7.5-325 MG tablet Commonly known as:  NORCO Take 1 tablet by mouth at bedtime.   insulin aspart 100 UNIT/ML injection Commonly known as:  novoLOG CBG < 70: implement hypoglycemia protocol CBG 70 - 120: 0 units CBG 121 - 150: 0 units CBG 151 - 200: 0 units CBG 201 - 250: 2 units CBG 251 - 300: 3 units CBG 301 - 350: 4 units CBG 351 - 400: 5 units CBG > 400: call MD Replaces:  NOVOLOG FLEXPEN 100 UNIT/ML FlexPen   Insulin Glargine 100 UNIT/ML Solostar Pen Commonly known as:  LANTUS Inject 25 Units into the skin 2 (two) times daily. What changed:  See the new instructions.   Ipratropium-Albuterol 20-100 MCG/ACT Aers respimat Commonly known as:  COMBIVENT Inhale 1 puff into the lungs every 6 (six) hours as needed for wheezing.   isosorbide mononitrate 30 MG 24 hr tablet Commonly known as:  IMDUR Take 1 tablet (30 mg total) by mouth daily. Start taking on:  06/17/2018   levothyroxine 50 MCG tablet Commonly known as:  SYNTHROID, LEVOTHROID TAKE 1 TABLET EVERY DAY BEFORE BREAKFAST   losartan 100 MG tablet Commonly known as:  COZAAR TAKE 1 TABLET EVERY DAY   montelukast 10 MG tablet Commonly known as:  SINGULAIR TAKE 1 TABLET (10 MG TOTAL) BY MOUTH AT BEDTIME.   omeprazole 40 MG capsule Commonly known as:  PRILOSEC TAKE 1 CAPSULE EVERY DAY   rosuvastatin 20 MG tablet Commonly known as:  CRESTOR Take 2 tablets (40 mg total) by mouth at bedtime. What changed:  how much to take       Contact information for follow-up providers    [facility]. Schedule an appointment as soon as possible for a visit in 4 week(s).   Specialty:  Radiology Contact information: [address] [place] [phone]       [name], [place]. Schedule an appointment as soon as possible for a visit in 1 week(s).   Specialty:  Family Medicine Contact  information: [place] [address] [phone]        Arnoldo Lenis, MD. Schedule an appointment as soon as possible for a visit in 2 week(s).   Specialty:  Cardiology Contact information: [address] [place] [address] [phone]            Contact information for after-discharge care    Destination    [facility] .   Service:  Skilled Nursing Contact information: [place] [phone]  Allergies  Allergen Reactions  . Atorvastatin Other (See Comments)    Leg weakness.    Consultations:   cardiology, neurology and ctvs  Other Procedures/Studies: Ct Angio Head W Or Wo Contrast  Result Date: 06/10/2018 CLINICAL DATA:  67 y/o F; status post NSTEMI and catheterization 06/07/2018. 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 Patient reports blacking out of vision in the lateral quadrant of the right eye. EXAM: CT ANGIOGRAPHY HEAD AND NECK TECHNIQUE: Multidetector CT imaging of the head and neck was performed using the standard protocol during bolus administration of intravenous contrast. Multiplanar CT image reconstructions and MIPs were obtained to evaluate the vascular anatomy. Carotid stenosis measurements (when applicable) are obtained utilizing NASCET criteria, using the distal internal carotid diameter as the denominator. CONTRAST:  50 cc Isovue 370 COMPARISON:  06/08/2018 MRI of the head. FINDINGS: CT HEAD FINDINGS Brain: Faint lucency in the left occipital lobe corresponding to small infarct on prior MRI. No new stroke, hemorrhage, focal mass effect, extra-axial collection, hydrocephalus, or herniation. Few nonspecific hypodensities in white matter are compatible with mild chronic microvascular ischemic changes and there is mild volume loss of the brain. Vascular: As below. Skull: Normal. Negative for fracture or focal lesion. Sinuses: Imaged  portions are clear. Orbits: No acute finding. Review of the MIP images confirms the above findings CTA NECK FINDINGS Aortic arch: Standard branching. Imaged portion shows no evidence of aneurysm or dissection. No significant stenosis of the major arch vessel origins. Moderate aortic calcific atherosclerosis. Right carotid system: No evidence of dissection, stenosis (50% or greater) or occlusion. Calcified plaque of the carotid bifurcation with mild less than 50% proximal ICA stenosis. Left carotid system: No evidence of dissection, stenosis (50% or greater) or occlusion. Mixed plaque of the carotid bifurcation with mild less than 50% proximal ICA stenosis. Vertebral arteries: Codominant. No evidence of dissection, stenosis (50% or greater) or occlusion. Skeleton: Mild cervical spondylosis. No high-grade bony canal stenosis. Other neck: Right upper paratracheal lymphadenopathy measuring 18 x 15 mm (series 8, image 34). Upper chest: Negative. Review of the MIP images confirms the above findings CTA HEAD FINDINGS Anterior circulation: No significant stenosis, proximal occlusion, aneurysm, or vascular malformation. Calcified plaque of the carotid siphons with mild less less than 50% paraclinoid stenosis, greater on the left. Posterior circulation: No high-grade stenosis, proximal occlusion, aneurysm, or vascular malformation. Right vertebral artery calcified plaque with mild to moderate approximately 50% stenosis. Left vertebral artery calcified plaque with mild less than 50% stenosis. Venous sinuses: As permitted by contrast timing, patent. Anatomic variants: None significant. Delayed phase: No abnormal intracranial enhancement. Review of the MIP images confirms the above findings IMPRESSION: 1. Patent carotid and vertebral arteries. No dissection, aneurysm, or hemodynamically significant stenosis utilizing NASCET criteria. 2. Patent anterior and posterior intracranial circulation. No large vessel occlusion, aneurysm,  or high-grade stenosis. 3. Atherosclerosis of the aorta, carotid bifurcations, carotid siphons, and bilateral intracranial vertebral arteries. 4. Small left occipital lobe acute infarction better characterized on prior MRI. No new acute intracranial abnormality on noncontrast CT of head. No abnormal enhancement of the brain. 5. Right upper paratracheal lymphadenopathy of uncertain significance. 6. Mild cervical spine spondylosis. Electronically Signed   By: Kristine Garbe M.D.   On: 06/10/2018 01:19   Dg Chest 2 View  Result Date: 06/05/2018 CLINICAL DATA:  Chest pain EXAM: CHEST - 2 VIEW COMPARISON:  12/01/2014 FINDINGS: There is no focal parenchymal opacity. There is no pleural effusion or pneumothorax. There is stable cardiomegaly.  There is thoracic aortic atherosclerosis. The osseous structures are unremarkable. IMPRESSION: No active cardiopulmonary disease. Electronically Signed   By: Kathreen Devoid   On: 06/05/2018 15:50   Ct Head Wo Contrast  Result Date: 06/07/2018 CLINICAL DATA:  66 year old female with altered mental status and nausea. EXAM: CT HEAD WITHOUT CONTRAST TECHNIQUE: Contiguous axial images were obtained from the base of the skull through the vertex without intravenous contrast. COMPARISON:  Head CT 09/12/2017. FINDINGS: Brain: Cerebral volume is stable and within normal limits for age. No midline shift, ventriculomegaly, mass effect, evidence of mass lesion, intracranial hemorrhage or evidence of cortically based acute infarction. Gray-white matter differentiation is within normal limits throughout the brain. Vascular: Calcified atherosclerosis at the skull base. No suspicious intracranial vascular hyperdensity. Skull: Hyperostosis, compatible with normal variant. Bone mineralization is within normal limits. No acute osseous abnormality identified. Sinuses/Orbits: Visualized paranasal sinuses and mastoids are stable and well pneumatized. Other: No acute orbit or scalp soft tissue  findings. IMPRESSION: Stable and normal for age non contrast CT appearance of the brain. Electronically Signed   By: Genevie Ann M.D.   On: 06/07/2018 12:46   Ct Angio Neck W Or Wo Contrast  Result Date: 06/10/2018 CLINICAL DATA:  67 y/o F; status post NSTEMI and catheterization 06/07/2018. 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 Patient reports blacking out of vision in the lateral quadrant of the right eye. EXAM: CT ANGIOGRAPHY HEAD AND NECK TECHNIQUE: Multidetector CT imaging of the head and neck was performed using the standard protocol during bolus administration of intravenous contrast. Multiplanar CT image reconstructions and MIPs were obtained to evaluate the vascular anatomy. Carotid stenosis measurements (when applicable) are obtained utilizing NASCET criteria, using the distal internal carotid diameter as the denominator. CONTRAST:  50 cc Isovue 370 COMPARISON:  06/08/2018 MRI of the head. FINDINGS: CT HEAD FINDINGS Brain: Faint lucency in the left occipital lobe corresponding to small infarct on prior MRI. No new stroke, hemorrhage, focal mass effect, extra-axial collection, hydrocephalus, or herniation. Few nonspecific hypodensities in white matter are compatible with mild chronic microvascular ischemic changes and there is mild volume loss of the brain. Vascular: As below. Skull: Normal. Negative for fracture or focal lesion. Sinuses: Imaged portions are clear. Orbits: No acute finding. Review of the MIP images confirms the above findings CTA NECK FINDINGS Aortic arch: Standard branching. Imaged portion shows no evidence of aneurysm or dissection. No significant stenosis of the major arch vessel origins. Moderate aortic calcific atherosclerosis. Right carotid system: No evidence of dissection, stenosis (50% or greater) or occlusion. Calcified plaque of the carotid bifurcation with mild less than 50% proximal ICA stenosis. Left carotid system: No evidence of dissection, stenosis  (50% or greater) or occlusion. Mixed plaque of the carotid bifurcation with mild less than 50% proximal ICA stenosis. Vertebral arteries: Codominant. No evidence of dissection, stenosis (50% or greater) or occlusion. Skeleton: Mild cervical spondylosis. No high-grade bony canal stenosis. Other neck: Right upper paratracheal lymphadenopathy measuring 18 x 15 mm (series 8, image 34). Upper chest: Negative. Review of the MIP images confirms the above findings CTA HEAD FINDINGS Anterior circulation: No significant stenosis, proximal occlusion, aneurysm, or vascular malformation. Calcified plaque of the carotid siphons with mild less less than 50% paraclinoid stenosis, greater on the left. Posterior circulation: No high-grade stenosis, proximal occlusion, aneurysm, or vascular malformation. Right vertebral artery calcified plaque with mild to moderate approximately 50% stenosis. Left vertebral artery calcified plaque with mild less than 50% stenosis. Venous  sinuses: As permitted by contrast timing, patent. Anatomic variants: None significant. Delayed phase: No abnormal intracranial enhancement. Review of the MIP images confirms the above findings IMPRESSION: 1. Patent carotid and vertebral arteries. No dissection, aneurysm, or hemodynamically significant stenosis utilizing NASCET criteria. 2. Patent anterior and posterior intracranial circulation. No large vessel occlusion, aneurysm, or high-grade stenosis. 3. Atherosclerosis of the aorta, carotid bifurcations, carotid siphons, and bilateral intracranial vertebral arteries. 4. Small left occipital lobe acute infarction better characterized on prior MRI. No new acute intracranial abnormality on noncontrast CT of head. No abnormal enhancement of the brain. 5. Right upper paratracheal lymphadenopathy of uncertain significance. 6. Mild cervical spine spondylosis. Electronically Signed   By: Kristine Garbe M.D.   On: 06/10/2018 01:19   Mr Brain Wo  Contrast  Result Date: 06/08/2018 CLINICAL DATA:  Altered mental status, nausea. Suspect stroke. History of hypertension, hyperlipidemia, diabetes. EXAM: MRI HEAD WITHOUT CONTRAST TECHNIQUE: Multiplanar, multiecho pulse sequences of the brain and surrounding structures were obtained without intravenous contrast. COMPARISON:  CT HEAD June 07, 2018. FINDINGS: INTRACRANIAL CONTENTS: Subcentimeter foci reduced diffusion LEFT occipital lobe with low ADC values. No susceptibility artifact to suggest hemorrhage. Ventricles and sulci are normal for patient's age. A fused subcentimeter supratentorial and patchy pontine white matter FLAIR T2 hyperintensities. No midline shift, mass effect or masses. No abnormal extra-axial fluid collections. VASCULAR: Normal major intracranial vascular flow voids present at skull base. Loss of LEFT mid to distal posterior cerebral artery flow void versus artifact. SKULL AND UPPER CERVICAL SPINE: No abnormal sellar expansion. No suspicious calvarial bone marrow signal. Craniocervical junction maintained. SINUSES/ORBITS: The mastoid air-cells and included paranasal sinuses are well-aerated.The included ocular globes and orbital contents are non-suspicious. OTHER: None. IMPRESSION: 1. 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 2. Occluded LEFT mid to distal PCA versus artifact. Consider CTA versus MRA head. 3. Mild chronic small vessel ischemic changes. 4. These results will be called to the ordering clinician or representative by the Radiologist Assistant, and communication documented in the PACS or zVision Dashboard. Electronically Signed   By: Elon Alas M.D.   On: 06/08/2018 13:23      TODAY-DAY OF DISCHARGE:  Subjective:   Mary Raymond today has no headache,no chest abdominal pain,no new weakness tingling or numbness, feels much better wants to go home today.   Objective:   Blood pressure (!) 146/42, pulse 76, temperature (!) 97.3 F (36.3 C),  temperature source Oral, resp. rate 20, height 5\' 5"  (1.651 m), weight 121.5 kg, SpO2 97 %.  Intake/Output Summary (Last 24 hours) at 06/16/2018 1136 Last data filed at 06/16/2018 0900 Gross per 24 hour  Intake 720 ml  Output 240 ml  Net 480 ml   Filed Weights   06/06/18 1513 06/15/18 0332 06/16/18 0609  Weight: 122.4 kg 121.8 kg 121.5 kg    Exam: Awake Alert, Oriented *3, No new F.N deficits, Normal affect [place].AT,PERRAL Supple Neck,No JVD, No cervical lymphadenopathy appriciated.  Symmetrical Chest wall movement, Good air movement bilaterally, CTAB RRR,No Gallops,Rubs or new Murmurs, No Parasternal Heave +ve B.Sounds, Abd Soft, Non tender, No organomegaly appriciated, No rebound -guarding or rigidity. No Cyanosis, Clubbing or edema, No new Rash or bruise   PERTINENT RADIOLOGIC STUDIES: Ct Angio Head W Or Wo Contrast  Result Date: 06/10/2018 CLINICAL DATA:  67 y/o F; status post NSTEMI and catheterization 06/07/2018. 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 Patient reports blacking out of vision in the lateral quadrant of the right eye.  EXAM: CT ANGIOGRAPHY HEAD AND NECK TECHNIQUE: Multidetector CT imaging of the head and neck was performed using the standard protocol during bolus administration of intravenous contrast. Multiplanar CT image reconstructions and MIPs were obtained to evaluate the vascular anatomy. Carotid stenosis measurements (when applicable) are obtained utilizing NASCET criteria, using the distal internal carotid diameter as the denominator. CONTRAST:  50 cc Isovue 370 COMPARISON:  06/08/2018 MRI of the head. FINDINGS: CT HEAD FINDINGS Brain: Faint lucency in the left occipital lobe corresponding to small infarct on prior MRI. No new stroke, hemorrhage, focal mass effect, extra-axial collection, hydrocephalus, or herniation. Few nonspecific hypodensities in white matter are compatible with mild chronic microvascular ischemic changes and there is mild  volume loss of the brain. Vascular: As below. Skull: Normal. Negative for fracture or focal lesion. Sinuses: Imaged portions are clear. Orbits: No acute finding. Review of the MIP images confirms the above findings CTA NECK FINDINGS Aortic arch: Standard branching. Imaged portion shows no evidence of aneurysm or dissection. No significant stenosis of the major arch vessel origins. Moderate aortic calcific atherosclerosis. Right carotid system: No evidence of dissection, stenosis (50% or greater) or occlusion. Calcified plaque of the carotid bifurcation with mild less than 50% proximal ICA stenosis. Left carotid system: No evidence of dissection, stenosis (50% or greater) or occlusion. Mixed plaque of the carotid bifurcation with mild less than 50% proximal ICA stenosis. Vertebral arteries: Codominant. No evidence of dissection, stenosis (50% or greater) or occlusion. Skeleton: Mild cervical spondylosis. No high-grade bony canal stenosis. Other neck: Right upper paratracheal lymphadenopathy measuring 18 x 15 mm (series 8, image 34). Upper chest: Negative. Review of the MIP images confirms the above findings CTA HEAD FINDINGS Anterior circulation: No significant stenosis, proximal occlusion, aneurysm, or vascular malformation. Calcified plaque of the carotid siphons with mild less less than 50% paraclinoid stenosis, greater on the left. Posterior circulation: No high-grade stenosis, proximal occlusion, aneurysm, or vascular malformation. Right vertebral artery calcified plaque with mild to moderate approximately 50% stenosis. Left vertebral artery calcified plaque with mild less than 50% stenosis. Venous sinuses: As permitted by contrast timing, patent. Anatomic variants: None significant. Delayed phase: No abnormal intracranial enhancement. Review of the MIP images confirms the above findings IMPRESSION: 1. Patent carotid and vertebral arteries. No dissection, aneurysm, or hemodynamically significant stenosis  utilizing NASCET criteria. 2. Patent anterior and posterior intracranial circulation. No large vessel occlusion, aneurysm, or high-grade stenosis. 3. Atherosclerosis of the aorta, carotid bifurcations, carotid siphons, and bilateral intracranial vertebral arteries. 4. Small left occipital lobe acute infarction better characterized on prior MRI. No new acute intracranial abnormality on noncontrast CT of head. No abnormal enhancement of the brain. 5. Right upper paratracheal lymphadenopathy of uncertain significance. 6. Mild cervical spine spondylosis. Electronically Signed   By: Kristine Garbe M.D.   On: 06/10/2018 01:19   Dg Chest 2 View  Result Date: 06/05/2018 CLINICAL DATA:  Chest pain EXAM: CHEST - 2 VIEW COMPARISON:  12/01/2014 FINDINGS: There is no focal parenchymal opacity. There is no pleural effusion or pneumothorax. There is stable cardiomegaly. There is thoracic aortic atherosclerosis. The osseous structures are unremarkable. IMPRESSION: No active cardiopulmonary disease. Electronically Signed   By: Kathreen Devoid   On: 06/05/2018 15:50   Ct Head Wo Contrast  Result Date: 06/07/2018 CLINICAL DATA:  67 year old female with altered mental status and nausea. EXAM: CT HEAD WITHOUT CONTRAST TECHNIQUE: Contiguous axial images were obtained from the base of the skull through the vertex without intravenous contrast. COMPARISON:  Head CT  09/12/2017. FINDINGS: Brain: Cerebral volume is stable and within normal limits for age. No midline shift, ventriculomegaly, mass effect, evidence of mass lesion, intracranial hemorrhage or evidence of cortically based acute infarction. Gray-white matter differentiation is within normal limits throughout the brain. Vascular: Calcified atherosclerosis at the skull base. No suspicious intracranial vascular hyperdensity. Skull: Hyperostosis, compatible with normal variant. Bone mineralization is within normal limits. No acute osseous abnormality identified.  Sinuses/Orbits: Visualized paranasal sinuses and mastoids are stable and well pneumatized. Other: No acute orbit or scalp soft tissue findings. IMPRESSION: Stable and normal for age non contrast CT appearance of the brain. Electronically Signed   By: Genevie Ann M.D.   On: 06/07/2018 12:46   Ct Angio Neck W Or Wo Contrast  Result Date: 06/10/2018 CLINICAL DATA:  67 y/o F; status post NSTEMI and catheterization 06/07/2018. 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 Patient reports blacking out of vision in the lateral quadrant of the right eye. EXAM: CT ANGIOGRAPHY HEAD AND NECK TECHNIQUE: Multidetector CT imaging of the head and neck was performed using the standard protocol during bolus administration of intravenous contrast. Multiplanar CT image reconstructions and MIPs were obtained to evaluate the vascular anatomy. Carotid stenosis measurements (when applicable) are obtained utilizing NASCET criteria, using the distal internal carotid diameter as the denominator. CONTRAST:  50 cc Isovue 370 COMPARISON:  06/08/2018 MRI of the head. FINDINGS: CT HEAD FINDINGS Brain: Faint lucency in the left occipital lobe corresponding to small infarct on prior MRI. No new stroke, hemorrhage, focal mass effect, extra-axial collection, hydrocephalus, or herniation. Few nonspecific hypodensities in white matter are compatible with mild chronic microvascular ischemic changes and there is mild volume loss of the brain. Vascular: As below. Skull: Normal. Negative for fracture or focal lesion. Sinuses: Imaged portions are clear. Orbits: No acute finding. Review of the MIP images confirms the above findings CTA NECK FINDINGS Aortic arch: Standard branching. Imaged portion shows no evidence of aneurysm or dissection. No significant stenosis of the major arch vessel origins. Moderate aortic calcific atherosclerosis. Right carotid system: No evidence of dissection, stenosis (50% or greater) or occlusion. Calcified plaque  of the carotid bifurcation with mild less than 50% proximal ICA stenosis. Left carotid system: No evidence of dissection, stenosis (50% or greater) or occlusion. Mixed plaque of the carotid bifurcation with mild less than 50% proximal ICA stenosis. Vertebral arteries: Codominant. No evidence of dissection, stenosis (50% or greater) or occlusion. Skeleton: Mild cervical spondylosis. No high-grade bony canal stenosis. Other neck: Right upper paratracheal lymphadenopathy measuring 18 x 15 mm (series 8, image 34). Upper chest: Negative. Review of the MIP images confirms the above findings CTA HEAD FINDINGS Anterior circulation: No significant stenosis, proximal occlusion, aneurysm, or vascular malformation. Calcified plaque of the carotid siphons with mild less less than 50% paraclinoid stenosis, greater on the left. Posterior circulation: No high-grade stenosis, proximal occlusion, aneurysm, or vascular malformation. Right vertebral artery calcified plaque with mild to moderate approximately 50% stenosis. Left vertebral artery calcified plaque with mild less than 50% stenosis. Venous sinuses: As permitted by contrast timing, patent. Anatomic variants: None significant. Delayed phase: No abnormal intracranial enhancement. Review of the MIP images confirms the above findings IMPRESSION: 1. Patent carotid and vertebral arteries. No dissection, aneurysm, or hemodynamically significant stenosis utilizing NASCET criteria. 2. Patent anterior and posterior intracranial circulation. No large vessel occlusion, aneurysm, or high-grade stenosis. 3. Atherosclerosis of the aorta, carotid bifurcations, carotid siphons, and bilateral intracranial vertebral arteries. 4. Small left occipital  lobe acute infarction better characterized on prior MRI. No new acute intracranial abnormality on noncontrast CT of head. No abnormal enhancement of the brain. 5. Right upper paratracheal lymphadenopathy of uncertain significance. 6. Mild cervical  spine spondylosis. Electronically Signed   By: Kristine Garbe M.D.   On: 06/10/2018 01:19   Mr Brain Wo Contrast  Result Date: 06/08/2018 CLINICAL DATA:  Altered mental status, nausea. Suspect stroke. History of hypertension, hyperlipidemia, diabetes. EXAM: MRI HEAD WITHOUT CONTRAST TECHNIQUE: Multiplanar, multiecho pulse sequences of the brain and surrounding structures were obtained without intravenous contrast. COMPARISON:  CT HEAD June 07, 2018. FINDINGS: INTRACRANIAL CONTENTS: Subcentimeter foci reduced diffusion LEFT occipital lobe with low ADC values. No susceptibility artifact to suggest hemorrhage. Ventricles and sulci are normal for patient's age. A fused subcentimeter supratentorial and patchy pontine white matter FLAIR T2 hyperintensities. No midline shift, mass effect or masses. No abnormal extra-axial fluid collections. VASCULAR: Normal major intracranial vascular flow voids present at skull base. Loss of LEFT mid to distal posterior cerebral artery flow void versus artifact. SKULL AND UPPER CERVICAL SPINE: No abnormal sellar expansion. No suspicious calvarial bone marrow signal. Craniocervical junction maintained. SINUSES/ORBITS: The mastoid air-cells and included paranasal sinuses are well-aerated.The included ocular globes and orbital contents are non-suspicious. OTHER: None. IMPRESSION: 1. 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 2. Occluded LEFT mid to distal PCA versus artifact. Consider CTA versus MRA head. 3. Mild chronic small vessel ischemic changes. 4. These results will be called to the ordering clinician or representative by the Radiologist Assistant, and communication documented in the PACS or zVision Dashboard. Electronically Signed   By: Elon Alas M.D.   On: 06/08/2018 13:23     PERTINENT LAB RESULTS: CBC: Recent Labs    06/16/18 0335  WBC 12.1*  HGB 9.5*  HCT 32.7*  PLT 222   CMET CMP     Component Value Date/Time   NA 139  06/16/2018 0335   NA 137 02/25/2018 1033   K 4.4 06/16/2018 0335   CL 105 06/16/2018 0335   CO2 25 06/16/2018 0335   GLUCOSE 175 (H) 06/16/2018 0335   BUN 33 (H) 06/16/2018 0335   BUN 15 02/25/2018 1033   CREATININE 1.70 (H) 06/16/2018 0335   CREATININE 0.83 02/23/2013 1159   CALCIUM 9.2 06/16/2018 0335   PROT 6.3 02/25/2018 1033   ALBUMIN 3.5 (L) 02/25/2018 1033   AST 12 02/25/2018 1033   ALT 9 02/25/2018 1033   ALKPHOS 118 (H) 02/25/2018 1033   BILITOT 0.4 02/25/2018 1033   GFRNONAA 30 (L) 06/16/2018 0335   GFRNONAA 76 02/23/2013 1159   GFRAA 35 (L) 06/16/2018 0335   GFRAA 88 02/23/2013 1159    GFR Estimated Creatinine Clearance: 42.6 mL/min (A) (by C-G formula based on SCr of 1.7 mg/dL (H)). No results for input(s): LIPASE, AMYLASE in the last 72 hours. No results for input(s): CKTOTAL, CKMB, CKMBINDEX, TROPONINI in the last 72 hours. Invalid input(s): POCBNP No results for input(s): DDIMER in the last 72 hours. No results for input(s): HGBA1C in the last 72 hours. No results for input(s): CHOL, HDL, LDLCALC, TRIG, CHOLHDL, LDLDIRECT in the last 72 hours. No results for input(s): TSH, T4TOTAL, T3FREE, THYROIDAB in the last 72 hours.  Invalid input(s): FREET3 No results for input(s): VITAMINB12, FOLATE, FERRITIN, TIBC, IRON, RETICCTPCT in the last 72 hours. Coags: No results for input(s): INR in the last 72 hours.  Invalid input(s): PT Microbiology: Recent Results (from the past 240 hour(s))  MRSA PCR Screening  Status: None   Collection Time: 06/06/18  5:47 PM  Result Value Ref Range Status   MRSA by PCR NEGATIVE NEGATIVE Final    Comment:        The GeneXpert MRSA Assay (FDA approved for NASAL specimens only), is one component of a comprehensive MRSA colonization surveillance program. It is not intended to diagnose MRSA infection nor to guide or monitor treatment for MRSA infections. Performed at [facility], [place] [address], [place], [place]  [address]     FURTHER DISCHARGE INSTRUCTIONS:  Get Medicines reviewed and adjusted: Please take all your medications with you for your next visit with your Primary MD  Laboratory/radiological data: Please request your Primary MD to go over all hospital tests and procedure/radiological results at the follow up, please ask your Primary MD to get all Hospital records sent to his/her office.  In some cases, they will be blood work, cultures and biopsy results pending at the time of your discharge. Please request that your primary care M.D. goes through all the records of your hospital data and follows up on these results.  Also Note the following: If you experience worsening of your admission symptoms, develop shortness of breath, life threatening emergency, suicidal or homicidal thoughts you must seek medical attention immediately by calling 911 or calling your MD immediately  if symptoms less severe.  You must read complete instructions/literature along with all the possible adverse reactions/side effects for all the Medicines you take and that have been prescribed to you. Take any new Medicines after you have completely understood and accpet all the possible adverse reactions/side effects.   Do not drive when taking Pain medications or sleeping medications (Benzodaizepines)  Do not take more than prescribed Pain, Sleep and Anxiety Medications. It is not advisable to combine anxiety,sleep and pain medications without talking with your primary care practitioner  Special Instructions: If you have smoked or chewed Tobacco  in the last 2 yrs please stop smoking, stop any regular Alcohol  and or any Recreational drug use.  Wear Seat belts while driving.  Please note: You were cared for by a hospitalist during your hospital stay. Once you are discharged, your primary care physician will handle any further medical issues. Please note that NO REFILLS for any discharge medications will be authorized  once you are discharged, as it is imperative that you return to your primary care physician (or establish a relationship with a primary care physician if you do not have one) for your post hospital discharge needs so that they can reassess your need for medications and monitor your lab values.  Total Time spent coordinating discharge including counseling, education and face to face time equals 35 minutes.  SignedOren Binet 06/16/2018 11:36 AM

## 2018-06-16 NOTE — Progress Notes (Addendum)
CSW contacted [facility] to restart insurance auth for disposition planning today.  Will follow up with facility. CSW spoke with facility and they will not be able to take an LOG for pt while waiting for insurance auth. The facility should received auth on Tuesday according to facility..[place]  update physician ,RN. And pt.   Reed Breech LCSWA [phone]

## 2018-06-16 NOTE — Care Management Important Message (Signed)
Important Message  Patient Details  Name: Mary Raymond MRN: [id number] Date of Birth: May 01, 1951   Medicare Important Message Given:  Yes    Ronald Londo P Carmichaels 06/16/2018, 2:10 PM

## 2018-06-16 NOTE — Progress Notes (Signed)
CARDIAC REHAB PHASE I   PRE:  Rate/Rhythm: 50 SB  BP:  Supine: 142/55  Sitting:   Standing:    SaO2: 96%RA  MODE:  Ambulation: 60 ft Outside of room  POST:  Rate/Rhythm: 82  BP:  Supine: 160/52  Sitting:   Standing:    SaO2: 95%RA 1430-1450 Talked with PT earlier and pt doing a little better with walking so we went to see pt. Pt agreed to walk short distance in hall. C/o right leg wanting to buckle. Had to sit in chair at door on return to room. Pt did walk 60 ft outside of room with rolling walker and asst x 2. Stressed importance of plavix with stent again. Back to bed after walk. No c/o CP.   Graylon Good, RN BSN  06/16/2018 2:46 PM

## 2018-06-16 NOTE — Progress Notes (Signed)
Physical Therapy Treatment Patient Details Name: Mary Raymond MRN: [id number] DOB: [birth year] Today's Date: 06/16/2018    History of Present Illness 67 y.o. female admitted [facility]19 with NSTEMI with heart cath on 9/20, and pt with right visual loss (9/22) and MRI showing small subcentimeter left occipital infarcts. PMHx:  HTN, HLD, type II DM, obesity, gout    PT Comments    Pt admitted with above diagnosis. Pt currently with functional limitations due to balance and endurance deficits. Pt was able to ambulate with RW with min guard assist.  Still with poor endurance and cannot accept challenge to balance.  Pt will benefit from skilled PT to increase their independence and safety with mobility to allow discharge to the venue listed below.     Follow Up Recommendations  SNF;Supervision for mobility/OOB     Equipment Recommendations  Rolling walker with 5" wheels    Recommendations for Other Services       Precautions / Restrictions Precautions Precautions: Fall Precaution Comments: R visual field cut Restrictions Weight Bearing Restrictions: No    Mobility  Bed Mobility               General bed mobility comments: sitting in chair on arrival  Transfers Overall transfer level: Needs assistance Equipment used: Rolling walker (2 wheeled) Transfers: Sit to/from Stand Sit to Stand: Min guard         General transfer comment: increased time and effort.  cues for hand placement  Ambulation/Gait Ambulation/Gait assistance: Min guard Gait Distance (Feet): 50 Feet Assistive device: Rolling walker (2 wheeled) Gait Pattern/deviations: Step-through pattern;Decreased stride length;Trunk flexed Gait velocity: decreased  Gait velocity interpretation: <1.8 ft/sec, indicate of risk for recurrent falls General Gait Details: trunk flexed, use of RW with min guard. Pt fatigues quickly however pt was able to ambulate to door and back x 2.     Stairs             Wheelchair Mobility    Modified Rankin (Stroke Patients Only) Modified Rankin (Stroke Patients Only) Pre-Morbid Rankin Score: Moderate disability Modified Rankin: Moderately severe disability     Balance Overall balance assessment: Needs assistance Sitting-balance support: No upper extremity supported;Feet supported Sitting balance-Leahy Scale: Fair Sitting balance - Comments: Able to maintain static sitting   Standing balance support: Bilateral upper extremity supported;During functional activity Standing balance-Leahy Scale: Fair Standing balance comment: can release walker in static standing without LOB                            Cognition Arousal/Alertness: Awake/alert Behavior During Therapy: WFL for tasks assessed/performed Overall Cognitive Status: Impaired/Different from baseline Area of Impairment: Memory;Following commands;Safety/judgement;Problem solving;Attention                   Current Attention Level: Sustained Memory: Decreased short-term memory Following Commands: Follows one step commands with increased time Safety/Judgement: Decreased awareness of safety;Decreased awareness of deficits   Problem Solving: Slow processing;Requires verbal cues General Comments: pt losing train of thought during conversation      Exercises General Exercises - Upper Extremity Shoulder Flexion: AROM;Both;5 reps;Seated General Exercises - Lower Extremity Ankle Circles/Pumps: AROM;5 reps;Both;Seated Long Arc Quad: AROM;Both;5 reps;Seated    General Comments General comments (skin integrity, edema, etc.): Discussed at length how pt feels about taking care of herself at home.  She agrees that a short term SNF prior to d/c would be helpful.  Pertinent Vitals/Pain Pain Assessment: No/denies pain    Home Living                      Prior Function            PT Goals (current goals can now be found in the care plan section) Acute  Rehab PT Goals Patient Stated Goal: "get my head clearer" Progress towards PT goals: Progressing toward goals    Frequency    Min 3X/week      PT Plan Current plan remains appropriate    Co-evaluation              AM-PAC PT "6 Clicks" Daily Activity  Outcome Measure  Difficulty turning over in bed (including adjusting bedclothes, sheets and blankets)?: Unable Difficulty moving from lying on back to sitting on the side of the bed? : Unable Difficulty sitting down on and standing up from a chair with arms (e.g., wheelchair, bedside commode, etc,.)?: A Little Help needed moving to and from a bed to chair (including a wheelchair)?: A Little Help needed walking in hospital room?: A Little Help needed climbing 3-5 steps with a railing? : A Lot 6 Click Score: 13    End of Session Equipment Utilized During Treatment: Gait belt Activity Tolerance: Patient limited by fatigue Patient left: with call bell/phone within reach;in chair Nurse Communication: Mobility status PT Visit Diagnosis: Unsteadiness on feet (R26.81);Other abnormalities of gait and mobility (R26.89);Muscle weakness (generalized) (M62.81);Other symptoms and signs involving the nervous system (O03.212)     Time: 2482-5003 PT Time Calculation (min) (ACUTE ONLY): 15 min  Charges:  $Gait Training: 8-22 mins                     [place] Pager:  [phone]  Office:  [place] 06/16/2018, 12:38 PM

## 2018-06-17 ENCOUNTER — Inpatient Hospital Stay (HOSPITAL_COMMUNITY): Payer: Medicare HMO

## 2018-06-17 ENCOUNTER — Encounter (HOSPITAL_COMMUNITY): Payer: Self-pay | Admitting: Radiology

## 2018-06-17 DIAGNOSIS — E8779 Other fluid overload: Secondary | ICD-10-CM

## 2018-06-17 DIAGNOSIS — Z955 Presence of coronary angioplasty implant and graft: Secondary | ICD-10-CM

## 2018-06-17 DIAGNOSIS — F1120 Opioid dependence, uncomplicated: Secondary | ICD-10-CM

## 2018-06-17 DIAGNOSIS — F419 Anxiety disorder, unspecified: Secondary | ICD-10-CM

## 2018-06-17 DIAGNOSIS — R7989 Other specified abnormal findings of blood chemistry: Secondary | ICD-10-CM

## 2018-06-17 DIAGNOSIS — Z0289 Encounter for other administrative examinations: Secondary | ICD-10-CM

## 2018-06-17 LAB — GLUCOSE, CAPILLARY
GLUCOSE-CAPILLARY: 181 mg/dL — AB (ref 70–99)
GLUCOSE-CAPILLARY: 230 mg/dL — AB (ref 70–99)
Glucose-Capillary: 189 mg/dL — ABNORMAL HIGH (ref 70–99)
Glucose-Capillary: 173 mg/dL — ABNORMAL HIGH (ref 70–99)

## 2018-06-17 MED ORDER — HYDROCODONE-ACETAMINOPHEN 7.5-325 MG PO TABS
1.0000 | ORAL_TABLET | ORAL | Status: DC | PRN
  Administered 2018-06-17: 1 via ORAL
  Filled 2018-06-17: qty 1

## 2018-06-17 MED ORDER — GABAPENTIN 100 MG PO CAPS
100.0000 mg | ORAL_CAPSULE | Freq: Three times a day (TID) | ORAL | Status: DC
  Administered 2018-06-17 – 2018-06-19 (×5): 100 mg via ORAL
  Filled 2018-06-17 (×5): qty 1

## 2018-06-17 MED ORDER — HYDROCODONE-ACETAMINOPHEN 7.5-325 MG PO TABS: 1.0000 | ORAL_TABLET | Freq: Four times a day (QID) | ORAL | 0 refills | Status: DC | PRN

## 2018-06-17 MED ORDER — TRAMADOL HCL 50 MG PO TABS: 50.0000 mg | ORAL_TABLET | Freq: Four times a day (QID) | ORAL | Status: DC | PRN

## 2018-06-17 MED ORDER — CYCLOBENZAPRINE HCL 10 MG PO TABS: 5.0000 mg | ORAL_TABLET | Freq: Three times a day (TID) | ORAL | Status: DC | PRN

## 2018-06-17 MED ORDER — GABAPENTIN 300 MG PO CAPS
300.0000 mg | ORAL_CAPSULE | Freq: Once | ORAL | Status: AC
  Administered 2018-06-17: 300 mg via ORAL
  Filled 2018-06-17: qty 1

## 2018-06-17 MED ORDER — GABAPENTIN 300 MG PO CAPS: 300.0000 mg | ORAL_CAPSULE | Freq: Three times a day (TID) | ORAL | Status: DC

## 2018-06-17 NOTE — Progress Notes (Signed)
Clinical Social Worker facilitated patient discharge including contacting patient family and facility to confirm patient discharge plans.  Clinical information faxed to facility and family agreeable with plan.  CSW arranged ambulance transport via [facility] to [facility].  RN to call [phone] and ask for 300 hall RN (pt will go in rm# [phone]) for report prior to discharge.  Clinical Social Worker will sign off for now as social work intervention is no longer needed. Please consult [place] again if new need arises.  Mary Raymond, MSW, [place]

## 2018-06-17 NOTE — Progress Notes (Addendum)
PROGRESS NOTE    Mary Raymond  FGH:[id number] DOB: [birth year] DOA: 06/05/2018 [name], FNP   Brief Narrative: Mary Raymond is a30 y.o.F with fibromyalgia, morbid obesity, HTN, DM who presentned with chest pain, found have  NSTEMI.  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  Hospital course complicated by a small left PCA infarct-found on MRI on 9/22 after patient complained of decreased vision in the left eye.    Patient was initially considered for discharge to skilled nursing facility today but she developed headache developing shoulder pain.  Patient will be observed overnight.  Will get CT scan of the head stat, carotid Doppler ultrasound and EKG.  Patient recently had MRI and CT angiogram.  Assessment & Plan:   Principal Problem:   Chest pain Active Problems:   Hypothyroidism   Hypertension associated with diabetes ([place])   Hyperlipidemia associated with type 2 diabetes mellitus (HCC)   Obesity, morbid, BMI 40.0-49.9 (HCC)   Elevated troponin   NSTEMI (non-ST elevated myocardial infarction) ([place])   Cerebral embolism with cerebral infarction  Right-sided neck pain headache shoulder pain jaw pain.  Muscle pain.  Patient does have history of fibromyalgia but she believes that this is different pain at this time.  Patient recently had extensive work-up with a CT angiogram of the head and neck MRI of the brain.  Patient is extremely concerned including the patient's daughter.  Will get CT scan of the head plain stat, noted with ultrasound and EKG.  Start the patient on Neurontin and tramadol.  Non-STEMI: Chest pain-free, LHC on 9/20 showed three-vessel disease, not felt to be a candidate for CABG by CT surgery, subsequently went back to the Cath Lab and had PCI to RCA.  We will continue with aspirin/Plavix and statin on discharge.  Not on beta-blocker given Mobitz 1 second-degree heart block.    Will need to follow-up with  cardiology in 1 week after discharge.  Acute on chronic kidney disease stage III: Suspect acute kidney injury probably a component of contrast nephropathy, creatinine levels improving and now close to usual baseline.  Lasix 20 mg p.o. daily has been resumed  Acute left PCA stroke: Evaluated by neurology-underwent work-up-CTA head/neck did not show any significant stenosis, echocardiogram showed preserved EF without any embolic source.  Remains on aspirin/Plavix and statin.  Please ensure outpatient follow-up with neurology.  Neurology also recommending a 30-day cardiac event monitoring as outpatient to rule out A. Fib.  Dyslipidemia: Continue statin but Lipitor changed to 40 mg.  Hypertension: Blood pressure fluctuating but relatively better than the past 2 days-continue amlodipine, Imdur, hydralazine and losartan.  Optimize in the outpatient setting.  DM-2: Continue Lantus 25 units twice daily and SSI-follow CBGs and optimize further in the outpatient setting.  Ambulatory dysfunction/dizziness: May be secondary to CVA-continue PT at SNF   DVT prophylaxis: lovenox  Code Status: Full code  Family Communication:  Spoke with ith the patient's daughter on the phone.  Disposition Plan: Skilled nursing facility  Consultants: Neurology, cardiology  Procedures: Cardiac catheterization  Antimicrobials:   Subjective:  Patient complains of headache right-sided neck pain jaw pain and shoulder discomfort.  She is tender on palpation.  Patient was initially considered for discharge today to SNF but because of the symptoms patient and the family is concerned about it.  Objective: Vitals:   06/17/18 0525 06/17/18 0817 06/17/18 1220 06/17/18 1457  BP: (!) 164/61 (!) 163/59  (!) 129/43  Pulse:  62 62 69 (!) 56  Resp: 18 16 18 20   Temp: 97.6 F (36.4 C)   97.8 F (36.6 C)  TempSrc: Oral   Oral  SpO2: 95%  95% 95%  Weight: 120.9 kg     Height:        Intake/Output Summary (Last 24  hours) at 06/17/2018 1741 Last data filed at 06/17/2018 1300 Gross per 24 hour  Intake 480 ml  Output -  Net 480 ml   Filed Weights   06/15/18 0332 06/16/18 0609 06/17/18 0525  Weight: 121.8 kg 121.5 kg 120.9 kg    Examination: General exam: Complains of mild discomfort on the neck headache and jaw.  HEENT:PERRL,Oral mucosa moist, Ear/Nose normal on gross exam Respiratory system: Bilateral equal air entry, normal vesicular breath sounds, no wheezes or crackles  Cardiovascular system: S1 & S2 heard, RRR. No JVD, murmurs, rubs, gallops or clicks. No pedal edema. Gastrointestinal system: Abdomen is nondistended, soft and nontender. No organomegaly or masses felt. Normal bowel sounds heard. MSK: Normal muscle bulk,tone ,power Extremities: No edema, no clubbing ,no cyanosis, distal peripheral pulses palpable.  Tenderness on palpation. Skin: No rashes, lesions or ulcers,no icterus ,no pallor Psychiatry: Judgement and insight appear normal. Mood & affect appropriate.  Central nervous system: Alert and oriented. No focal neurological deficits.  Data Reviewed: I have personally reviewed following labs and imaging studies  CBC: Recent Labs  Lab 06/11/18 0237 06/12/18 0526 06/13/18 0411 06/16/18 0335  WBC 10.1 10.5 11.5* 12.1*  HGB 9.5* 10.5* 10.3* 9.5*  HCT 32.2* 35.4* 34.8* 32.7*  MCV 82.8 82.7 83.9 83.8  PLT 183 222 PLATELET CLUMPS NOTED ON SMEAR, UNABLE TO ESTIMATE 875   Basic Metabolic Panel: Recent Labs  Lab 06/12/18 0526 06/13/18 0411 06/14/18 0537 06/15/18 0347 06/16/18 0335  NA 141 137 136 139 139  K 4.4 4.5 4.2 4.4 4.4  CL 106 102 103 106 105  CO2 28 24 25 25 25   GLUCOSE 129* 167* 202* 157* 175*  BUN 20 30* 35* 35* 33*  CREATININE 1.27* 1.82* 2.15* 1.96* 1.70*  CALCIUM 9.1 9.1 8.8* 9.0 9.2   GFR: Estimated Creatinine Clearance: 42.4 mL/min (A) (by C-G formula based on SCr of 1.7 mg/dL (H)). Liver Function Tests: No results for input(s): AST, ALT, ALKPHOS,  BILITOT, PROT, ALBUMIN in the last 168 hours. No results for input(s): LIPASE, AMYLASE in the last 168 hours. No results for input(s): AMMONIA in the last 168 hours. Coagulation Profile: No results for input(s): INR, PROTIME in the last 168 hours. Cardiac Enzymes: No results for input(s): CKTOTAL, CKMB, CKMBINDEX, TROPONINI in the last 168 hours. BNP (last 3 results) No results for input(s): PROBNP in the last 8760 hours. HbA1C: No results for input(s): HGBA1C in the last 72 hours. CBG: Recent Labs  Lab 06/16/18 1558 06/16/18 2043 06/17/18 0728 06/17/18 1143 06/17/18 1603  GLUCAP 200* 232* 189* 181* 230*   Lipid Profile: No results for input(s): CHOL, HDL, LDLCALC, TRIG, CHOLHDL, LDLDIRECT in the last 72 hours. Thyroid Function Tests: No results for input(s): TSH, T4TOTAL, FREET4, T3FREE, THYROIDAB in the last 72 hours. Anemia Panel: No results for input(s): VITAMINB12, FOLATE, FERRITIN, TIBC, IRON, RETICCTPCT in the last 72 hours. Sepsis Labs: No results for input(s): PROCALCITON, LATICACIDVEN in the last 168 hours.  No results found for this or any previous visit (from the past 240 hour(s)).   Radiology Studies: No results found.  Scheduled Meds: . acyclovir  400 mg Oral Daily  . amLODipine  10  mg Oral Daily  . aspirin EC  81 mg Oral Daily  . clopidogrel  75 mg Oral Daily  . enoxaparin (LOVENOX) injection  30 mg Subcutaneous Q24H  . fluticasone  2 spray Each Nare Daily  . furosemide  20 mg Oral Daily  . hydrALAZINE  25 mg Oral BID  . insulin aspart  0-5 Units Subcutaneous QHS  . insulin aspart  0-9 Units Subcutaneous TID WC  . insulin aspart  15 Units Subcutaneous TID WC  . insulin glargine  25 Units Subcutaneous BID  . isosorbide mononitrate  30 mg Oral Daily  . levothyroxine  100 mcg Oral QAC breakfast  . losartan  100 mg Oral Daily  . mouth rinse  15 mL Mouth Rinse BID  . montelukast  10 mg Oral QHS  . pantoprazole  80 mg Oral Daily  . rosuvastatin  40 mg  Oral QHS  . sodium chloride flush  3 mL Intravenous Q12H  . sodium chloride flush  3 mL Intravenous Q12H   Continuous Infusions:   LOS: 11 days   Time spent: More than 50% of that time was spent in counseling and/or coordination of care.  Flora Lipps, MD [facility] Pager [phone]  If 7PM-7AM, please contact night-coverage [email or web address] Password [email or web address] 06/17/2018, 5:41 PM

## 2018-06-17 NOTE — Progress Notes (Signed)
1325 Offered to walk with pt.  She declined due to constipation. Stated too uncomfortable at this time. Will continue to follow . Graylon Good RN BSN 06/17/2018 1:26 PM

## 2018-06-17 NOTE — Progress Notes (Signed)
Inpatient Diabetes Program Recommendations  AACE/ADA: New Consensus Statement on Inpatient Glycemic Control (2015)  Target Ranges:  Prepandial:   less than 140 mg/dL      Peak postprandial:   less than 180 mg/dL (1-2 hours)      Critically ill patients:  140 - 180 mg/dL   Lab Results  Component Value Date   GLUCAP 181 (H) 06/17/2018   HGBA1C 7.3 (H) 06/05/2018    Review of Glycemic Control Results for Mary Raymond, Mary Raymond (MRN [id number]) as of 06/17/2018 13:03  Ref. Range 06/16/2018 15:58 06/16/2018 20:43 06/17/2018 07:28 06/17/2018 11:43  Glucose-Capillary Latest Ref Range: 70 - 99 mg/dL 200 (H) 232 (H) 189 (H) 181 (H)   Diabetes history: Type 2 DM Outpatient Diabetes medications: Novolog 20 units TID, Lantus 30 units BID Current orders for Inpatient glycemic control: Lantus 25 units BID, Novolog 0-9 units TID, Novolog 0-5 units QHS, Novolog 15 units TID  Inpatient Diabetes Program Recommendations:    If post prandials continue to exceed 180 mg/dL while awaiting SNF placement, consider increasing meal coverage to Novolog 18 units TID (assuming that patient is consuming >50% of meal).  Thanks, Bronson Curb, MSN, RNC-OB Diabetes Coordinator [phone] (8a-5p)

## 2018-06-17 NOTE — Progress Notes (Signed)
Occupational Therapy Treatment Patient Details Name: Mary Raymond MRN: [id number] DOB: 27-Sep-1950 Today's Date: 06/17/2018    History of present illness 67 y.o. female admitted to [facility] 9/19 with NSTEMI with heart cath on 9/20, and pt with right visual loss (9/22) and MRI showing small subcentimeter left occipital infarcts. PMHx:  HTN, HLD, type II DM, obesity, gout   OT comments  Patient was min guard assist with sit to stand from bed. Patient AMB to bathroom with walker at min guard assist. Patient was min guard assist with stand to sit and sit to stand from commode with grab bar. Patient was s with clothing management in standing. Pt. Was able to stand at sink for 4 min for grooming tasks. Patent was able to don/doff socks while sitting EIB and bringing legs up onto the bed. Patient was let sitting in chair.   Follow Up Recommendations       Equipment Recommendations       Recommendations for Other Services      Precautions / Restrictions Precautions Precautions: Fall Precaution Comments: R visual field cut resulting from procedure.        Mobility Bed Mobility         Supine to sit: Modified independent (Device/Increase time)        Transfers Overall transfer level: Needs assistance     Sit to Stand: Min guard              Balance                                           ADL either performed or assessed with clinical judgement   ADL       Grooming: Wash/dry hands;Wash/dry face;Oral care;Supervision/safety;Standing               Lower Body Dressing: Min guard;Sit to/from stand   Toilet Transfer: Min guard;Ambulation;Regular Toilet;Grab bars;RW   Toileting- Water quality scientist and Hygiene: Min guard;Sit to/from stand       Functional mobility during ADLs: Min guard;Rolling walker General ADL Comments: Patient was able to locate grooming objexts without cues.     Vision   Additional Comments: patient stated  her feild cut has resolved. patient was able to see objects peripheral   Perception     Praxis      Cognition Arousal/Alertness: Awake/alert Behavior During Therapy: [facility] for tasks assessed/performed                                            Exercises     Shoulder Instructions       General Comments      Pertinent Vitals/ Pain       Pain Assessment: 0-10 Pain Score: 2  Pain Location: neck Pain Intervention(s): Limited activity within patient's tolerance;Patient requesting pain meds-RN notified  Home Living                                          Prior Functioning/Environment              Frequency           Progress Toward Goals  OT Goals(current goals can  now be found in the care plan section)  Progress towards OT goals: Progressing toward goals  Acute Rehab OT Goals Patient Stated Goal: I am ready to go to rehab  Plan Discharge plan remains appropriate    Co-evaluation                 AM-PAC PT "6 Clicks" Daily Activity     Outcome Measure   Help from another person eating meals?: None Help from another person taking care of personal grooming?: A Little Help from another person toileting, which includes using toliet, bedpan, or urinal?: A Little Help from another person bathing (including washing, rinsing, drying)?: A Little Help from another person to put on and taking off regular upper body clothing?: A Little Help from another person to put on and taking off regular lower body clothing?: A Little 6 Click Score: 19    End of Session Equipment Utilized During Treatment: Gait belt;Rolling walker      Activity Tolerance Patient tolerated treatment well   Patient Left in chair;with call bell/phone within reach   Nurse Communication (aware that pnt does not have chair alarm)        Time: 6468-0321 OT Time Calculation (min): 40 min  Charges: OT General Charges $OT Visit: 1 Visit OT  Treatments $Self Care/Home Management : 38-52 mins  6 clciks   Diondre Pulis 06/17/2018, 9:51 AM

## 2018-06-17 NOTE — Discharge Summary (Signed)
PATIENT DETAILS Name: Mary Raymond Age: 67 y.o. Sex: female Date of Birth: 1951/03/19 MRN: [id number]. Admitting Physician: Shelbie Proctor, MD PFX:[id number], Theador Hawthorne, FNP  Admit Date: 06/05/2018 Discharge date: 06/17/2018  Recommendations for Outpatient Follow-up:   1. Please obtain BMP/CBC in one week 2. Please ensure follow-up with cardiology 3. 30-day event monitor as an outpatient per neurology recommendations  Admitted From:  SNF  Disposition: SNF   Home Health: No  Equipment/Devices: None  Discharge Condition: Stable  CODE STATUS: FULL CODE  Diet recommendation:  Heart Healthy / Carb Modified  Brief Summary: See H&P, Labs, Consult and Test reports for all details in brief, Mary Raymond is a 67 y.o. F with fibromyalgia, morbid obesity, HTN, DM who prsetned with chest pain, found have  NSTEMI.  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  Hospital course complicated by a small left PCA infarct-found on MRI on 9/22 after patient complained of decreased vision in the left eye.  Brief Hospital Course:  Non-STEMI: Chest pain-free, LHC on 9/20 showed three-vessel disease, not felt to be a candidate for CABG by CT surgery, subsequently went back to the Cath Lab and had PCI to RCA.  We will continue with aspirin/Plavix and statin on discharge.  Not on beta-blocker given Mobitz 1 second-degree heart block.  Please ensure follow-up with cardiology in the next week or so.  Acute on chronic kidney disease stage III: Suspect acute kidney injury probably a component of contrast nephropathy, creatinine levels improving and now close to usual baseline.  Lasix 20 mg p.o. daily has been resumed  Acute left PCA stroke: Evaluated by neurology-underwent work-up-CTA head/neck did not show any significant stenosis, echocardiogram showed preserved EF without any embolic source.  Remains on aspirin/Plavix and statin.  Please ensure  outpatient follow-up with neurology.  Neurology also recommending a 30-day cardiac event monitoring as outpatient to rule out A. Fib.  Dyslipidemia: Continue statin but Lipitor changed to 40 mg.  Hypertension: Blood pressure fluctuating but relatively better than the past 2 days-continue amlodipine, Imdur, hydralazine and losartan.  Optimize in the outpatient setting.  DM-2: Continue Lantus 25 units twice daily and SSI-follow CBGs and optimize further in the outpatient setting.  Ambulatory dysfunction/dizziness: May be secondary to CVA-continue PT at SNF  Procedures/Studies: 9/20 >>LHC:  Prox RCA to Mid RCA lesion is 90% stenosed.  Dist RCA lesion is 80% stenosed.  Mid RCA lesion is 80% stenosed.  Acute Mrg lesion is 40% stenosed.  Mid LM lesion is 55% stenosed.  Ost Cx lesion is 90% stenosed.  Prox Cx lesion is 85% stenosed.  Ost LAD to Prox LAD lesion is 50% stenosed.  9/25>> PCI to proximal and mid RCA  Discharge Diagnoses:  Principal Problem:   Chest pain Active Problems:   Hypothyroidism   Hypertension associated with diabetes ([place])   Hyperlipidemia associated with type 2 diabetes mellitus (HCC)   Obesity, morbid, BMI 40.0-49.9 (HCC)   Elevated troponin   NSTEMI (non-ST elevated myocardial infarction) ([place])   Cerebral embolism with cerebral infarction Fibromyalgia   Discharge Instructions:  Activity:  As tolerated with Full fall precautions use walker/cane & assistance as needed   Discharge Instructions    Amb Referral to Cardiac Rehabilitation   Complete by:  As directed    Diagnosis:   NSTEMI Coronary Stents     Diet - low sodium heart healthy   Complete by:  As directed    Diet - low sodium  heart healthy   Complete by:  As directed    Diet Carb Modified   Complete by:  As directed    Discharge instructions   Complete by:  As directed    Follow with Primary MD  [name], FNP in 1 week  Follow with Cardiology as instructed  Please get  a complete blood count and chemistry panel checked by your Primary MD at your next visit, and again as instructed by your Primary MD.  Get Medicines reviewed and adjusted: Please take all your medications with you for your next visit with your Primary MD  Laboratory/radiological data: Please request your Primary MD to go over all hospital tests and procedure/radiological results at the follow up, please ask your Primary MD to get all Hospital records sent to his/her office.  In some cases, they will be blood work, cultures and biopsy results pending at the time of your discharge. Please request that your primary care M.D. follows up on these results.  Also Note the following: If you experience worsening of your admission symptoms, develop shortness of breath, life threatening emergency, suicidal or homicidal thoughts you must seek medical attention immediately by calling 911 or calling your MD immediately  if symptoms less severe.  You must read complete instructions/literature along with all the possible adverse reactions/side effects for all the Medicines you take and that have been prescribed to you. Take any new Medicines after you have completely understood and accpet all the possible adverse reactions/side effects.   Do not drive when taking Pain medications or sleeping medications (Benzodaizepines)  Do not take more than prescribed Pain, Sleep and Anxiety Medications. It is not advisable to combine anxiety,sleep and pain medications without talking with your primary care practitioner  Special Instructions: If you have smoked or chewed Tobacco  in the last 2 yrs please stop smoking, stop any regular Alcohol  and or any Recreational drug use.  Wear Seat belts while driving.  Please note: You were cared for by a hospitalist during your hospital stay. Once you are discharged, your primary care physician will handle any further medical issues. Please note that NO REFILLS for any discharge  medications will be authorized once you are discharged, as it is imperative that you return to your primary care physician (or establish a relationship with a primary care physician if you do not have one) for your post hospital discharge needs so that they can reassess your need for medications and monitor your lab values.   Discharge instructions   Complete by:  As directed    Follow up with primary care physician at the SNF in 3-5 days. Continue medications as prescribed.   Increase activity slowly   Complete by:  As directed    Increase activity slowly   Complete by:  As directed      Allergies as of 06/17/2018      Reactions   Atorvastatin Other (See Comments)   Leg weakness.      Medication List    STOP taking these medications   metFORMIN 500 MG tablet Commonly known as:  GLUCOPHAGE   metoprolol succinate 25 MG 24 hr tablet Commonly known as:  TOPROL-XL   NOVOLOG FLEXPEN 100 UNIT/ML FlexPen Generic drug:  insulin aspart Replaced by:  insulin aspart 100 UNIT/ML injection     TAKE these medications   ACCU-CHEK SOFTCLIX LANCETS lancets Test BG TID   acyclovir 400 MG tablet Commonly known as:  ZOVIRAX TAKE 1 TABLET EVERY DAY  albuterol 108 (90 Base) MCG/ACT inhaler Commonly known as:  PROVENTIL HFA;VENTOLIN HFA Inhale 1-2 puffs into the lungs every 6 (six) hours as needed for wheezing or shortness of breath.   ALPRAZolam 0.5 MG tablet Commonly known as:  XANAX TAKE 1 TABLET 2 TIMES A DAY AS NEEDED FOR ANXIETY   amLODipine 10 MG tablet Commonly known as:  NORVASC TAKE 1 TABLET EVERY DAY   aspirin 81 MG EC tablet Take 1 tablet (81 mg total) by mouth daily.   B-D UF III MINI PEN NEEDLES 31G X 5 MM Misc Generic drug:  Insulin Pen Needle USE AS INSTRUCTED TO GIVE LANTUS INJECTION TWICE DAILY   clopidogrel 75 MG tablet Commonly known as:  PLAVIX Take 1 tablet (75 mg total) by mouth daily.   fluticasone 50 MCG/ACT nasal spray Commonly known as:  FLONASE USE  2 SPRAYS IN EACH NOSTRIL EVERY DAY   furosemide 20 MG tablet Commonly known as:  LASIX TAKE 1 TABLET (20 MG TOTAL) BY MOUTH DAILY.   gabapentin 300 MG capsule Commonly known as:  NEURONTIN Take 1 capsule (300 mg total) by mouth 3 (three) times daily.   hydrALAZINE 25 MG tablet Commonly known as:  APRESOLINE Take 1 tablet (25 mg total) by mouth 2 (two) times daily.   HYDROcodone-acetaminophen 7.5-325 MG tablet Commonly known as:  NORCO Take 1 tablet by mouth every 6 (six) hours as needed for moderate pain. What changed:    when to take this  reasons to take this   insulin aspart 100 UNIT/ML injection Commonly known as:  novoLOG CBG < 70: implement hypoglycemia protocol CBG 70 - 120: 0 units CBG 121 - 150: 0 units CBG 151 - 200: 0 units CBG 201 - 250: 2 units CBG 251 - 300: 3 units CBG 301 - 350: 4 units CBG 351 - 400: 5 units CBG > 400: call MD Replaces:  NOVOLOG FLEXPEN 100 UNIT/ML FlexPen   Insulin Glargine 100 UNIT/ML Solostar Pen Commonly known as:  LANTUS Inject 25 Units into the skin 2 (two) times daily. What changed:  See the new instructions.   Ipratropium-Albuterol 20-100 MCG/ACT Aers respimat Commonly known as:  COMBIVENT Inhale 1 puff into the lungs every 6 (six) hours as needed for wheezing.   isosorbide mononitrate 30 MG 24 hr tablet Commonly known as:  IMDUR Take 1 tablet (30 mg total) by mouth daily.   levothyroxine 50 MCG tablet Commonly known as:  SYNTHROID, LEVOTHROID TAKE 1 TABLET EVERY DAY BEFORE BREAKFAST   losartan 100 MG tablet Commonly known as:  COZAAR TAKE 1 TABLET EVERY DAY   montelukast 10 MG tablet Commonly known as:  SINGULAIR TAKE 1 TABLET (10 MG TOTAL) BY MOUTH AT BEDTIME.   omeprazole 40 MG capsule Commonly known as:  PRILOSEC TAKE 1 CAPSULE EVERY DAY   rosuvastatin 20 MG tablet Commonly known as:  CRESTOR Take 2 tablets (40 mg total) by mouth at bedtime. What changed:  how much to take       Contact information  for follow-up providers    [facility]. Schedule an appointment as soon as possible for a visit in 4 week(s).   Specialty:  Radiology Contact information: [address] [place] [phone]       [name], [place]. Schedule an appointment as soon as possible for a visit in 1 week(s).   Specialty:  Family Medicine Contact information: [place] [address] [phone]  Arnoldo Lenis, MD. Schedule an appointment as soon as possible for a visit in 2 week(s).   Specialty:  Cardiology Contact information: [address] [place] [address] [phone]            Contact information for after-discharge care    Destination    [facility] .   Service:  Skilled Nursing Contact information: [place] [phone]                 Allergies  Allergen Reactions  . Atorvastatin Other (See Comments)    Leg weakness.    Consultations:   cardiology, neurology and ctvs  Other Procedures/Studies: Ct Angio Head W Or Wo Contrast  Result Date: 06/10/2018 CLINICAL DATA:  67 y/o F; status post NSTEMI and catheterization 06/07/2018. 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 Patient reports blacking out of vision in the lateral quadrant of the right eye. EXAM: CT ANGIOGRAPHY HEAD AND NECK TECHNIQUE: Multidetector CT imaging of the head and neck was performed using the standard protocol during bolus administration of intravenous contrast. Multiplanar CT image reconstructions and MIPs were obtained to evaluate the vascular anatomy. Carotid stenosis measurements (when applicable) are obtained utilizing NASCET criteria, using the distal internal carotid diameter as the denominator. CONTRAST:  50 cc Isovue 370 COMPARISON:  06/08/2018 MRI of the head. FINDINGS: CT HEAD FINDINGS Brain: Faint lucency in the left  occipital lobe corresponding to small infarct on prior MRI. No new stroke, hemorrhage, focal mass effect, extra-axial collection, hydrocephalus, or herniation. Few nonspecific hypodensities in white matter are compatible with mild chronic microvascular ischemic changes and there is mild volume loss of the brain. Vascular: As below. Skull: Normal. Negative for fracture or focal lesion. Sinuses: Imaged portions are clear. Orbits: No acute finding. Review of the MIP images confirms the above findings CTA NECK FINDINGS Aortic arch: Standard branching. Imaged portion shows no evidence of aneurysm or dissection. No significant stenosis of the major arch vessel origins. Moderate aortic calcific atherosclerosis. Right carotid system: No evidence of dissection, stenosis (50% or greater) or occlusion. Calcified plaque of the carotid bifurcation with mild less than 50% proximal ICA stenosis. Left carotid system: No evidence of dissection, stenosis (50% or greater) or occlusion. Mixed plaque of the carotid bifurcation with mild less than 50% proximal ICA stenosis. Vertebral arteries: Codominant. No evidence of dissection, stenosis (50% or greater) or occlusion. Skeleton: Mild cervical spondylosis. No high-grade bony canal stenosis. Other neck: Right upper paratracheal lymphadenopathy measuring 18 x 15 mm (series 8, image 34). Upper chest: Negative. Review of the MIP images confirms the above findings CTA HEAD FINDINGS Anterior circulation: No significant stenosis, proximal occlusion, aneurysm, or vascular malformation. Calcified plaque of the carotid siphons with mild less less than 50% paraclinoid stenosis, greater on the left. Posterior circulation: No high-grade stenosis, proximal occlusion, aneurysm, or vascular malformation. Right vertebral artery calcified plaque with mild to moderate approximately 50% stenosis. Left vertebral artery calcified plaque with mild less than 50% stenosis. Venous sinuses: As permitted by  contrast timing, patent. Anatomic variants: None significant. Delayed phase: No abnormal intracranial enhancement. Review of the MIP images confirms the above findings IMPRESSION: 1. Patent carotid and vertebral arteries. No dissection, aneurysm, or hemodynamically significant stenosis utilizing NASCET criteria. 2. Patent anterior and posterior intracranial circulation. No large vessel occlusion, aneurysm, or high-grade stenosis. 3. Atherosclerosis of the aorta, carotid bifurcations, carotid siphons, and bilateral intracranial vertebral arteries. 4. Small left occipital  lobe acute infarction better characterized on prior MRI. No new acute intracranial abnormality on noncontrast CT of head. No abnormal enhancement of the brain. 5. Right upper paratracheal lymphadenopathy of uncertain significance. 6. Mild cervical spine spondylosis. Electronically Signed   By: Kristine Garbe M.D.   On: 06/10/2018 01:19   Dg Chest 2 View  Result Date: 06/05/2018 CLINICAL DATA:  Chest pain EXAM: CHEST - 2 VIEW COMPARISON:  12/01/2014 FINDINGS: There is no focal parenchymal opacity. There is no pleural effusion or pneumothorax. There is stable cardiomegaly. There is thoracic aortic atherosclerosis. The osseous structures are unremarkable. IMPRESSION: No active cardiopulmonary disease. Electronically Signed   By: Kathreen Devoid   On: 06/05/2018 15:50   Ct Head Wo Contrast  Result Date: 06/07/2018 CLINICAL DATA:  67 year old female with altered mental status and nausea. EXAM: CT HEAD WITHOUT CONTRAST TECHNIQUE: Contiguous axial images were obtained from the base of the skull through the vertex without intravenous contrast. COMPARISON:  Head CT 09/12/2017. FINDINGS: Brain: Cerebral volume is stable and within normal limits for age. No midline shift, ventriculomegaly, mass effect, evidence of mass lesion, intracranial hemorrhage or evidence of cortically based acute infarction. Gray-white matter differentiation is within  normal limits throughout the brain. Vascular: Calcified atherosclerosis at the skull base. No suspicious intracranial vascular hyperdensity. Skull: Hyperostosis, compatible with normal variant. Bone mineralization is within normal limits. No acute osseous abnormality identified. Sinuses/Orbits: Visualized paranasal sinuses and mastoids are stable and well pneumatized. Other: No acute orbit or scalp soft tissue findings. IMPRESSION: Stable and normal for age non contrast CT appearance of the brain. Electronically Signed   By: Genevie Ann M.D.   On: 06/07/2018 12:46   Ct Angio Neck W Or Wo Contrast  Result Date: 06/10/2018 CLINICAL DATA:  67 y/o F; status post NSTEMI and catheterization 06/07/2018. 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 Patient reports blacking out of vision in the lateral quadrant of the right eye. EXAM: CT ANGIOGRAPHY HEAD AND NECK TECHNIQUE: Multidetector CT imaging of the head and neck was performed using the standard protocol during bolus administration of intravenous contrast. Multiplanar CT image reconstructions and MIPs were obtained to evaluate the vascular anatomy. Carotid stenosis measurements (when applicable) are obtained utilizing NASCET criteria, using the distal internal carotid diameter as the denominator. CONTRAST:  50 cc Isovue 370 COMPARISON:  06/08/2018 MRI of the head. FINDINGS: CT HEAD FINDINGS Brain: Faint lucency in the left occipital lobe corresponding to small infarct on prior MRI. No new stroke, hemorrhage, focal mass effect, extra-axial collection, hydrocephalus, or herniation. Few nonspecific hypodensities in white matter are compatible with mild chronic microvascular ischemic changes and there is mild volume loss of the brain. Vascular: As below. Skull: Normal. Negative for fracture or focal lesion. Sinuses: Imaged portions are clear. Orbits: No acute finding. Review of the MIP images confirms the above findings CTA NECK FINDINGS Aortic arch:  Standard branching. Imaged portion shows no evidence of aneurysm or dissection. No significant stenosis of the major arch vessel origins. Moderate aortic calcific atherosclerosis. Right carotid system: No evidence of dissection, stenosis (50% or greater) or occlusion. Calcified plaque of the carotid bifurcation with mild less than 50% proximal ICA stenosis. Left carotid system: No evidence of dissection, stenosis (50% or greater) or occlusion. Mixed plaque of the carotid bifurcation with mild less than 50% proximal ICA stenosis. Vertebral arteries: Codominant. No evidence of dissection, stenosis (50% or greater) or occlusion. Skeleton: Mild cervical spondylosis. No high-grade bony canal stenosis. Other neck:  Right upper paratracheal lymphadenopathy measuring 18 x 15 mm (series 8, image 34). Upper chest: Negative. Review of the MIP images confirms the above findings CTA HEAD FINDINGS Anterior circulation: No significant stenosis, proximal occlusion, aneurysm, or vascular malformation. Calcified plaque of the carotid siphons with mild less less than 50% paraclinoid stenosis, greater on the left. Posterior circulation: No high-grade stenosis, proximal occlusion, aneurysm, or vascular malformation. Right vertebral artery calcified plaque with mild to moderate approximately 50% stenosis. Left vertebral artery calcified plaque with mild less than 50% stenosis. Venous sinuses: As permitted by contrast timing, patent. Anatomic variants: None significant. Delayed phase: No abnormal intracranial enhancement. Review of the MIP images confirms the above findings IMPRESSION: 1. Patent carotid and vertebral arteries. No dissection, aneurysm, or hemodynamically significant stenosis utilizing NASCET criteria. 2. Patent anterior and posterior intracranial circulation. No large vessel occlusion, aneurysm, or high-grade stenosis. 3. Atherosclerosis of the aorta, carotid bifurcations, carotid siphons, and bilateral intracranial  vertebral arteries. 4. Small left occipital lobe acute infarction better characterized on prior MRI. No new acute intracranial abnormality on noncontrast CT of head. No abnormal enhancement of the brain. 5. Right upper paratracheal lymphadenopathy of uncertain significance. 6. Mild cervical spine spondylosis. Electronically Signed   By: Kristine Garbe M.D.   On: 06/10/2018 01:19   Mr Brain Wo Contrast  Result Date: 06/08/2018 CLINICAL DATA:  Altered mental status, nausea. Suspect stroke. History of hypertension, hyperlipidemia, diabetes. EXAM: MRI HEAD WITHOUT CONTRAST TECHNIQUE: Multiplanar, multiecho pulse sequences of the brain and surrounding structures were obtained without intravenous contrast. COMPARISON:  CT HEAD June 07, 2018. FINDINGS: INTRACRANIAL CONTENTS: Subcentimeter foci reduced diffusion LEFT occipital lobe with low ADC values. No susceptibility artifact to suggest hemorrhage. Ventricles and sulci are normal for patient's age. A fused subcentimeter supratentorial and patchy pontine white matter FLAIR T2 hyperintensities. No midline shift, mass effect or masses. No abnormal extra-axial fluid collections. VASCULAR: Normal major intracranial vascular flow voids present at skull base. Loss of LEFT mid to distal posterior cerebral artery flow void versus artifact. SKULL AND UPPER CERVICAL SPINE: No abnormal sellar expansion. No suspicious calvarial bone marrow signal. Craniocervical junction maintained. SINUSES/ORBITS: The mastoid air-cells and included paranasal sinuses are well-aerated.The included ocular globes and orbital contents are non-suspicious. OTHER: None. IMPRESSION: 1. 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 2. Occluded LEFT mid to distal PCA versus artifact. Consider CTA versus MRA head. 3. Mild chronic small vessel ischemic changes. 4. These results will be called to the ordering clinician or representative by the Radiologist Assistant, and communication  documented in the PACS or zVision Dashboard. Electronically Signed   By: Elon Alas M.D.   On: 06/08/2018 13:23     TODAY-DAY OF DISCHARGE:  Subjective:   Patient complains of mild headache, right sided neck shoulder and leg pain.  Objective:   Blood pressure (!) 163/59, pulse (!) 56, temperature 97.8 F (36.6 C), temperature source Oral, resp. rate 18, height 5\' 5"  (1.651 m), weight 120.9 kg, SpO2 95 %.  Intake/Output Summary (Last 24 hours) at 06/17/2018 1621 Last data filed at 06/17/2018 1300 Gross per 24 hour  Intake 720 ml  Output -  Net 720 ml   Filed Weights   06/15/18 0332 06/16/18 0609 06/17/18 0525  Weight: 121.8 kg 121.5 kg 120.9 kg    Exam: Awake Alert, Oriented *3, No new F.N deficits, Normal affect [place].AT,PERRAL Supple Neck,No JVD, No cervical lymphadenopathy appriciated.  Symmetrical Chest wall movement, Good air movement bilaterally, CTAB RRR,No Gallops,Rubs or new Murmurs, No  Parasternal Heave +ve B.Sounds, Abd Soft, Non tender, No organomegaly appriciated, No rebound -guarding or rigidity. No Cyanosis, Clubbing or edema, No new Rash or bruise   PERTINENT RADIOLOGIC STUDIES: Ct Angio Head W Or Wo Contrast  Result Date: 06/10/2018 CLINICAL DATA:  67 y/o F; status post NSTEMI and catheterization 06/07/2018. 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 Patient reports blacking out of vision in the lateral quadrant of the right eye. EXAM: CT ANGIOGRAPHY HEAD AND NECK TECHNIQUE: Multidetector CT imaging of the head and neck was performed using the standard protocol during bolus administration of intravenous contrast. Multiplanar CT image reconstructions and MIPs were obtained to evaluate the vascular anatomy. Carotid stenosis measurements (when applicable) are obtained utilizing NASCET criteria, using the distal internal carotid diameter as the denominator. CONTRAST:  50 cc Isovue 370 COMPARISON:  06/08/2018 MRI of the head. FINDINGS: CT HEAD  FINDINGS Brain: Faint lucency in the left occipital lobe corresponding to small infarct on prior MRI. No new stroke, hemorrhage, focal mass effect, extra-axial collection, hydrocephalus, or herniation. Few nonspecific hypodensities in white matter are compatible with mild chronic microvascular ischemic changes and there is mild volume loss of the brain. Vascular: As below. Skull: Normal. Negative for fracture or focal lesion. Sinuses: Imaged portions are clear. Orbits: No acute finding. Review of the MIP images confirms the above findings CTA NECK FINDINGS Aortic arch: Standard branching. Imaged portion shows no evidence of aneurysm or dissection. No significant stenosis of the major arch vessel origins. Moderate aortic calcific atherosclerosis. Right carotid system: No evidence of dissection, stenosis (50% or greater) or occlusion. Calcified plaque of the carotid bifurcation with mild less than 50% proximal ICA stenosis. Left carotid system: No evidence of dissection, stenosis (50% or greater) or occlusion. Mixed plaque of the carotid bifurcation with mild less than 50% proximal ICA stenosis. Vertebral arteries: Codominant. No evidence of dissection, stenosis (50% or greater) or occlusion. Skeleton: Mild cervical spondylosis. No high-grade bony canal stenosis. Other neck: Right upper paratracheal lymphadenopathy measuring 18 x 15 mm (series 8, image 34). Upper chest: Negative. Review of the MIP images confirms the above findings CTA HEAD FINDINGS Anterior circulation: No significant stenosis, proximal occlusion, aneurysm, or vascular malformation. Calcified plaque of the carotid siphons with mild less less than 50% paraclinoid stenosis, greater on the left. Posterior circulation: No high-grade stenosis, proximal occlusion, aneurysm, or vascular malformation. Right vertebral artery calcified plaque with mild to moderate approximately 50% stenosis. Left vertebral artery calcified plaque with mild less than 50%  stenosis. Venous sinuses: As permitted by contrast timing, patent. Anatomic variants: None significant. Delayed phase: No abnormal intracranial enhancement. Review of the MIP images confirms the above findings IMPRESSION: 1. Patent carotid and vertebral arteries. No dissection, aneurysm, or hemodynamically significant stenosis utilizing NASCET criteria. 2. Patent anterior and posterior intracranial circulation. No large vessel occlusion, aneurysm, or high-grade stenosis. 3. Atherosclerosis of the aorta, carotid bifurcations, carotid siphons, and bilateral intracranial vertebral arteries. 4. Small left occipital lobe acute infarction better characterized on prior MRI. No new acute intracranial abnormality on noncontrast CT of head. No abnormal enhancement of the brain. 5. Right upper paratracheal lymphadenopathy of uncertain significance. 6. Mild cervical spine spondylosis. Electronically Signed   By: Kristine Garbe M.D.   On: 06/10/2018 01:19   Dg Chest 2 View  Result Date: 06/05/2018 CLINICAL DATA:  Chest pain EXAM: CHEST - 2 VIEW COMPARISON:  12/01/2014 FINDINGS: There is no focal parenchymal opacity. There is no pleural effusion or pneumothorax. There  is stable cardiomegaly. There is thoracic aortic atherosclerosis. The osseous structures are unremarkable. IMPRESSION: No active cardiopulmonary disease. Electronically Signed   By: Kathreen Devoid   On: 06/05/2018 15:50   Ct Head Wo Contrast  Result Date: 06/07/2018 CLINICAL DATA:  67 year old female with altered mental status and nausea. EXAM: CT HEAD WITHOUT CONTRAST TECHNIQUE: Contiguous axial images were obtained from the base of the skull through the vertex without intravenous contrast. COMPARISON:  Head CT 09/12/2017. FINDINGS: Brain: Cerebral volume is stable and within normal limits for age. No midline shift, ventriculomegaly, mass effect, evidence of mass lesion, intracranial hemorrhage or evidence of cortically based acute infarction.  Gray-white matter differentiation is within normal limits throughout the brain. Vascular: Calcified atherosclerosis at the skull base. No suspicious intracranial vascular hyperdensity. Skull: Hyperostosis, compatible with normal variant. Bone mineralization is within normal limits. No acute osseous abnormality identified. Sinuses/Orbits: Visualized paranasal sinuses and mastoids are stable and well pneumatized. Other: No acute orbit or scalp soft tissue findings. IMPRESSION: Stable and normal for age non contrast CT appearance of the brain. Electronically Signed   By: Genevie Ann M.D.   On: 06/07/2018 12:46   Ct Angio Neck W Or Wo Contrast  Result Date: 06/10/2018 CLINICAL DATA:  67 y/o F; status post NSTEMI and catheterization 06/07/2018. 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 Patient reports blacking out of vision in the lateral quadrant of the right eye. EXAM: CT ANGIOGRAPHY HEAD AND NECK TECHNIQUE: Multidetector CT imaging of the head and neck was performed using the standard protocol during bolus administration of intravenous contrast. Multiplanar CT image reconstructions and MIPs were obtained to evaluate the vascular anatomy. Carotid stenosis measurements (when applicable) are obtained utilizing NASCET criteria, using the distal internal carotid diameter as the denominator. CONTRAST:  50 cc Isovue 370 COMPARISON:  06/08/2018 MRI of the head. FINDINGS: CT HEAD FINDINGS Brain: Faint lucency in the left occipital lobe corresponding to small infarct on prior MRI. No new stroke, hemorrhage, focal mass effect, extra-axial collection, hydrocephalus, or herniation. Few nonspecific hypodensities in white matter are compatible with mild chronic microvascular ischemic changes and there is mild volume loss of the brain. Vascular: As below. Skull: Normal. Negative for fracture or focal lesion. Sinuses: Imaged portions are clear. Orbits: No acute finding. Review of the MIP images confirms the above  findings CTA NECK FINDINGS Aortic arch: Standard branching. Imaged portion shows no evidence of aneurysm or dissection. No significant stenosis of the major arch vessel origins. Moderate aortic calcific atherosclerosis. Right carotid system: No evidence of dissection, stenosis (50% or greater) or occlusion. Calcified plaque of the carotid bifurcation with mild less than 50% proximal ICA stenosis. Left carotid system: No evidence of dissection, stenosis (50% or greater) or occlusion. Mixed plaque of the carotid bifurcation with mild less than 50% proximal ICA stenosis. Vertebral arteries: Codominant. No evidence of dissection, stenosis (50% or greater) or occlusion. Skeleton: Mild cervical spondylosis. No high-grade bony canal stenosis. Other neck: Right upper paratracheal lymphadenopathy measuring 18 x 15 mm (series 8, image 34). Upper chest: Negative. Review of the MIP images confirms the above findings CTA HEAD FINDINGS Anterior circulation: No significant stenosis, proximal occlusion, aneurysm, or vascular malformation. Calcified plaque of the carotid siphons with mild less less than 50% paraclinoid stenosis, greater on the left. Posterior circulation: No high-grade stenosis, proximal occlusion, aneurysm, or vascular malformation. Right vertebral artery calcified plaque with mild to moderate approximately 50% stenosis. Left vertebral artery calcified plaque with mild less than  50% stenosis. Venous sinuses: As permitted by contrast timing, patent. Anatomic variants: None significant. Delayed phase: No abnormal intracranial enhancement. Review of the MIP images confirms the above findings IMPRESSION: 1. Patent carotid and vertebral arteries. No dissection, aneurysm, or hemodynamically significant stenosis utilizing NASCET criteria. 2. Patent anterior and posterior intracranial circulation. No large vessel occlusion, aneurysm, or high-grade stenosis. 3. Atherosclerosis of the aorta, carotid bifurcations, carotid  siphons, and bilateral intracranial vertebral arteries. 4. Small left occipital lobe acute infarction better characterized on prior MRI. No new acute intracranial abnormality on noncontrast CT of head. No abnormal enhancement of the brain. 5. Right upper paratracheal lymphadenopathy of uncertain significance. 6. Mild cervical spine spondylosis. Electronically Signed   By: Kristine Garbe M.D.   On: 06/10/2018 01:19   Mr Brain Wo Contrast  Result Date: 06/08/2018 CLINICAL DATA:  Altered mental status, nausea. Suspect stroke. History of hypertension, hyperlipidemia, diabetes. EXAM: MRI HEAD WITHOUT CONTRAST TECHNIQUE: Multiplanar, multiecho pulse sequences of the brain and surrounding structures were obtained without intravenous contrast. COMPARISON:  CT HEAD June 07, 2018. FINDINGS: INTRACRANIAL CONTENTS: Subcentimeter foci reduced diffusion LEFT occipital lobe with low ADC values. No susceptibility artifact to suggest hemorrhage. Ventricles and sulci are normal for patient's age. A fused subcentimeter supratentorial and patchy pontine white matter FLAIR T2 hyperintensities. No midline shift, mass effect or masses. No abnormal extra-axial fluid collections. VASCULAR: Normal major intracranial vascular flow voids present at skull base. Loss of LEFT mid to distal posterior cerebral artery flow void versus artifact. SKULL AND UPPER CERVICAL SPINE: No abnormal sellar expansion. No suspicious calvarial bone marrow signal. Craniocervical junction maintained. SINUSES/ORBITS: The mastoid air-cells and included paranasal sinuses are well-aerated.The included ocular globes and orbital contents are non-suspicious. OTHER: None. IMPRESSION: 1. 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 2. Occluded LEFT mid to distal PCA versus artifact. Consider CTA versus MRA head. 3. Mild chronic small vessel ischemic changes. 4. These results will be called to the ordering clinician or representative by the  Radiologist Assistant, and communication documented in the PACS or zVision Dashboard. Electronically Signed   By: Elon Alas M.D.   On: 06/08/2018 13:23     PERTINENT LAB RESULTS: CBC: Recent Labs    06/16/18 0335  WBC 12.1*  HGB 9.5*  HCT 32.7*  PLT 222   CMET CMP     Component Value Date/Time   NA 139 06/16/2018 0335   NA 137 02/25/2018 1033   K 4.4 06/16/2018 0335   CL 105 06/16/2018 0335   CO2 25 06/16/2018 0335   GLUCOSE 175 (H) 06/16/2018 0335   BUN 33 (H) 06/16/2018 0335   BUN 15 02/25/2018 1033   CREATININE 1.70 (H) 06/16/2018 0335   CREATININE 0.83 02/23/2013 1159   CALCIUM 9.2 06/16/2018 0335   PROT 6.3 02/25/2018 1033   ALBUMIN 3.5 (L) 02/25/2018 1033   AST 12 02/25/2018 1033   ALT 9 02/25/2018 1033   ALKPHOS 118 (H) 02/25/2018 1033   BILITOT 0.4 02/25/2018 1033   GFRNONAA 30 (L) 06/16/2018 0335   GFRNONAA 76 02/23/2013 1159   GFRAA 35 (L) 06/16/2018 0335   GFRAA 88 02/23/2013 1159    GFR Estimated Creatinine Clearance: 42.4 mL/min (A) (by C-G formula based on SCr of 1.7 mg/dL (H)). No results for input(s): LIPASE, AMYLASE in the last 72 hours. No results for input(s): CKTOTAL, CKMB, CKMBINDEX, TROPONINI in the last 72 hours. Invalid input(s): POCBNP No results for input(s): DDIMER in the last 72 hours. No results for input(s): HGBA1C  in the last 72 hours. No results for input(s): CHOL, HDL, LDLCALC, TRIG, CHOLHDL, LDLDIRECT in the last 72 hours. No results for input(s): TSH, T4TOTAL, T3FREE, THYROIDAB in the last 72 hours.  Invalid input(s): FREET3 No results for input(s): VITAMINB12, FOLATE, FERRITIN, TIBC, IRON, RETICCTPCT in the last 72 hours. Coags: No results for input(s): INR in the last 72 hours.  Invalid input(s): PT Microbiology: No results found for this or any previous visit (from the past 240 hour(s)).  FURTHER DISCHARGE INSTRUCTIONS:  Get Medicines reviewed and adjusted: Please take all your medications with you for your  next visit with your Primary MD  Laboratory/radiological data: Please request your Primary MD to go over all hospital tests and procedure/radiological results at the follow up, please ask your Primary MD to get all Hospital records sent to his/her office.  In some cases, they will be blood work, cultures and biopsy results pending at the time of your discharge. Please request that your primary care M.D. goes through all the records of your hospital data and follows up on these results.  Also Note the following: If you experience worsening of your admission symptoms, develop shortness of breath, life threatening emergency, suicidal or homicidal thoughts you must seek medical attention immediately by calling 911 or calling your MD immediately  if symptoms less severe.  You must read complete instructions/literature along with all the possible adverse reactions/side effects for all the Medicines you take and that have been prescribed to you. Take any new Medicines after you have completely understood and accpet all the possible adverse reactions/side effects.   Do not drive when taking Pain medications or sleeping medications (Benzodaizepines)  Do not take more than prescribed Pain, Sleep and Anxiety Medications. It is not advisable to combine anxiety,sleep and pain medications without talking with your primary care practitioner  Special Instructions: If you have smoked or chewed Tobacco  in the last 2 yrs please stop smoking, stop any regular Alcohol  and or any Recreational drug use.  Wear Seat belts while driving.  Please note: You were cared for by a hospitalist during your hospital stay. Once you are discharged, your primary care physician will handle any further medical issues. Please note that NO REFILLS for any discharge medications will be authorized once you are discharged, as it is imperative that you return to your primary care physician (or establish a relationship with a primary care  physician if you do not have one) for your post hospital discharge needs so that they can reassess your need for medications and monitor your lab values.  Total Time spent coordinating discharge including counseling, education and face to face time equals 39 minutes.  SignedCorrie Mckusick [place] 06/17/2018 4:21 PM

## 2018-06-18 ENCOUNTER — Inpatient Hospital Stay (HOSPITAL_COMMUNITY): Payer: Medicare HMO

## 2018-06-18 DIAGNOSIS — Z01810 Encounter for preprocedural cardiovascular examination: Secondary | ICD-10-CM

## 2018-06-18 LAB — BASIC METABOLIC PANEL
ANION GAP: 7 (ref 5–15)
BUN: 36 mg/dL — ABNORMAL HIGH (ref 8–23)
CHLORIDE: 104 mmol/L (ref 98–111)
CO2: 24 mmol/L (ref 22–32)
CREATININE: 1.94 mg/dL — AB (ref 0.44–1.00)
Calcium: 8.9 mg/dL (ref 8.9–10.3)
GFR calc non Af Amer: 26 mL/min — ABNORMAL LOW (ref >60)
GFR, EST AFRICAN AMERICAN: 30 mL/min — AB (ref >60)
GLUCOSE: 255 mg/dL — AB (ref 70–99)
Potassium: 4.9 mmol/L (ref 3.5–5.1)
Sodium: 135 mmol/L (ref 135–145)

## 2018-06-18 LAB — CBC
HEMATOCRIT: 33.5 % — AB (ref 36.0–46.0)
HEMOGLOBIN: 9.7 g/dL — AB (ref 12.0–15.0)
MCH: 24.5 pg — ABNORMAL LOW (ref 26.0–34.0)
MCHC: 29 g/dL — AB (ref 30.0–36.0)
MCV: 84.6 fL (ref 78.0–100.0)
Platelets: 262 10*3/uL (ref 150–400)
RBC: 3.96 MIL/uL (ref 3.87–5.11)
RDW: 16.6 % — AB (ref 11.5–15.5)
WBC: 14 10*3/uL — ABNORMAL HIGH (ref 4.0–10.5)

## 2018-06-18 LAB — GLUCOSE, CAPILLARY
GLUCOSE-CAPILLARY: 204 mg/dL — AB (ref 70–99)
Glucose-Capillary: 171 mg/dL — ABNORMAL HIGH (ref 70–99)
Glucose-Capillary: 232 mg/dL — ABNORMAL HIGH (ref 70–99)
Glucose-Capillary: 203 mg/dL — ABNORMAL HIGH (ref 70–99)

## 2018-06-18 MED ORDER — AMLODIPINE BESYLATE 10 MG PO TABS
10.0000 mg | ORAL_TABLET | Freq: Every day | ORAL | Status: DC
  Administered 2018-06-19: 10 mg via ORAL
  Filled 2018-06-18: qty 1

## 2018-06-18 MED ORDER — HYDRALAZINE HCL 25 MG PO TABS
25.0000 mg | ORAL_TABLET | Freq: Two times a day (BID) | ORAL | Status: DC
  Administered 2018-06-18 – 2018-06-19 (×2): 25 mg via ORAL
  Filled 2018-06-18 (×2): qty 1

## 2018-06-18 MED ORDER — HYDRALAZINE HCL 25 MG PO TABS: 25.0000 mg | ORAL_TABLET | Freq: Three times a day (TID) | ORAL | Status: DC

## 2018-06-18 NOTE — Consult Note (Signed)
   [facility] CM Inpatient Consult   06/18/2018  Mary Raymond 10-05-1950 [id number]     Patient screened for potential [facility] Care Management services due to unplanned readmission risk score of 32% (extreme).  Chart review reveals disposition plans are for SNF. No identifiable [facility] Care Management needs at this time.   Marthenia Rolling, MSN-Ed, RN,BSN [facility] Liaison [phone]

## 2018-06-18 NOTE — Progress Notes (Signed)
CARDIAC REHAB PHASE I   Offered to walk with pt on two separate occassions. Pt declining both times. Pt states she's "Out of it today". Needed reminders that she worked with PT. Will continue to follow.  Rufina Falco, RN BSN 06/18/2018 2:19 PM

## 2018-06-18 NOTE — Progress Notes (Signed)
Physical Therapy Treatment Patient Details Name: Mary Raymond MRN: [id number] DOB: [birth year] Today's Date: 06/18/2018    History of Present Illness 67 y.o. female admitted to [facility] 9/19 with NSTEMI with heart cath on 9/20, and pt with right visual loss (9/22) and MRI showing small subcentimeter left occipital infarcts. PMHx:  HTN, HLD, type II DM, obesity, gout    PT Comments    Pt admitted with above diagnosis. Pt currently with functional limitations due to balance and endurance deficits. Pt initially refused to get up but then agreed to get to EOB.   Once sitting, pt stated she needed to use bathroom and pt was able to ambulate to bathroom with min guard assist.  Pt ready for SNF and anxious to go today.  Will progress as pt tolerates.  Pt will benefit from skilled PT to increase their independence and safety with mobility to allow discharge to the venue listed below.     Follow Up Recommendations  SNF;Supervision for mobility/OOB     Equipment Recommendations  Rolling walker with 5" wheels    Recommendations for Other Services       Precautions / Restrictions Precautions Precautions: Fall Precaution Comments: R visual field cut resulting from procedure.  Restrictions Weight Bearing Restrictions: No    Mobility  Bed Mobility Overal bed mobility: Modified Independent Bed Mobility: Rolling;Sidelying to Sit Rolling: Modified independent (Device/Increase time) Sidelying to sit: Modified independent (Device/Increase time) Supine to sit: Modified independent (Device/Increase time)        Transfers Overall transfer level: Needs assistance Equipment used: None Transfers: Sit to/from Stand Sit to Stand: Min guard         General transfer comment: increased time and effort.    Ambulation/Gait Ambulation/Gait assistance: Min guard Gait Distance (Feet): 40 Feet(20 feet x 2 ) Assistive device: None Gait Pattern/deviations: Step-through pattern;Decreased stride  length;Trunk flexed Gait velocity: decreased  Gait velocity interpretation: <1.8 ft/sec, indicate of risk for recurrent falls General Gait Details: trunk flexed, pt did not want to use RW  but was able to walk into bathroom holding onto sink and walls with min guard A. Instructed pt that the RW is safer to use to prevent falls and pt states she agrees.  Pt fatigues quickly and just appears more fatigued today on arrival.    Stairs             Wheelchair Mobility    Modified Rankin (Stroke Patients Only) Modified Rankin (Stroke Patients Only) Pre-Morbid Rankin Score: Moderate disability Modified Rankin: Moderately severe disability     Balance Overall balance assessment: Needs assistance Sitting-balance support: No upper extremity supported;Feet supported Sitting balance-Leahy Scale: Fair Sitting balance - Comments: Able to maintain static sitting   Standing balance support: Bilateral upper extremity supported;During functional activity Standing balance-Leahy Scale: Fair Standing balance comment: can release walker in static standing without LOB                            Cognition Arousal/Alertness: Awake/alert Behavior During Therapy: WFL for tasks assessed/performed Overall Cognitive Status: Impaired/Different from baseline Area of Impairment: Memory;Following commands;Safety/judgement;Problem solving;Attention                   Current Attention Level: Sustained Memory: Decreased short-term memory Following Commands: Follows one step commands with increased time Safety/Judgement: Decreased awareness of safety;Decreased awareness of deficits   Problem Solving: Slow processing;Requires verbal cues        Exercises  General Exercises - Lower Extremity Ankle Circles/Pumps: AROM;5 reps;Both;Seated Long Arc Quad: AROM;Both;Seated;10 reps Hip Flexion/Marching: AROM;Both;10 reps;Seated    General Comments        Pertinent Vitals/Pain Pain  Assessment: Faces Faces Pain Scale: Hurts little more Pain Location: neck Pain Descriptors / Indicators: Headache Pain Intervention(s): Limited activity within patient's tolerance;Monitored during session;Premedicated before session;Repositioned   VSS Home Living                      Prior Function            PT Goals (current goals can now be found in the care plan section) Acute Rehab PT Goals Patient Stated Goal: I am ready to go to rehab Progress towards PT goals: Progressing toward goals    Frequency    Min 3X/week      PT Plan Current plan remains appropriate    Co-evaluation              AM-PAC PT "6 Clicks" Daily Activity  Outcome Measure  Difficulty turning over in bed (including adjusting bedclothes, sheets and blankets)?: Unable Difficulty moving from lying on back to sitting on the side of the bed? : Unable Difficulty sitting down on and standing up from a chair with arms (e.g., wheelchair, bedside commode, etc,.)?: A Little Help needed moving to and from a bed to chair (including a wheelchair)?: A Little Help needed walking in hospital room?: A Little Help needed climbing 3-5 steps with a railing? : A Lot 6 Click Score: 13    End of Session Equipment Utilized During Treatment: Gait belt Activity Tolerance: Patient limited by fatigue Patient left: with call bell/phone within reach;in bed Nurse Communication: Mobility status PT Visit Diagnosis: Unsteadiness on feet (R26.81);Other abnormalities of gait and mobility (R26.89);Muscle weakness (generalized) (M62.81);Other symptoms and signs involving the nervous system (R29.898)     Time: 1046-1101 PT Time Calculation (min) (ACUTE ONLY): 15 min  Charges:  $Gait Training: 8-22 mins                     [place] Pager:  [phone]  Office:  [place] 06/18/2018, 12:04 PM

## 2018-06-18 NOTE — Progress Notes (Signed)
PROGRESS NOTE    Mary Raymond  DHW:[id number] DOB: 05/20/1951 DOA: 06/05/2018 [name], FNP   Brief Narrative: Mary Raymond is a27 y.o.F with fibromyalgia, morbid obesity, HTN, DM who presentned with chest pain, found have  NSTEMI.  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  Hospital course complicated by a small left PCA infarct-found on MRI on 9/22 after patient complained of decreased vision in the left eye.      Assessment & Plan:   Principal Problem:   Chest pain Active Problems:   Hypothyroidism   Hypertension associated with diabetes ([place])   Hyperlipidemia associated with type 2 diabetes mellitus (HCC)   Obesity, morbid, BMI 40.0-49.9 (HCC)   Elevated troponin   NSTEMI (non-ST elevated myocardial infarction) ([place])   Cerebral embolism with cerebral infarction  Right-sided neck pain headache shoulder pain jaw pain.  Muscle pain.  Patient does have history of fibromyalgia but she believes that this is different pain at this time.  Patient recently had extensive work-up with a CT angiogram of the head and neck MRI of the brain.  Patient is extremely concerned including the patient's daughter. CT scan of the head negative. Started the patient on Neurontin and tramadol.  Non-STEMI: Chest pain-free, LHC on 9/20 showed three-vessel disease, not felt to be a candidate for CABG by CT surgery, subsequently went back to the Cath Lab and had PCI to RCA.  We will continue with aspirin/Plavix and statin on discharge.  Not on beta-blocker given Mobitz 1 second-degree heart block.    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  Lasix 20 mg p.o. daily has been  resumed  Acute left PCA stroke: Evaluated by neurology-underwent work-up-CTA head/neck did not show any significant stenosis, echocardiogram showed preserved EF without any embolic source.  Remains on aspirin/Plavix and statin.  Please ensure outpatient follow-up with neurology.  Neurology also recommending a 30-day cardiac event monitoring as outpatient to rule out A. Fib.  Dyslipidemia: Continue statin but Lipitor changed to 40 mg.  Hypertension: Continue amlodipine, Imdur, hydralazine and losartan.  Optimize in the outpatient setting.  DM-2:  Continue Lantus 25 units twice daily and SSI-follow CBGs and optimize further in the outpatient setting.  Ambulatory dysfunction/dizziness: May be secondary to CVA-continue PT at SNF  Morbid obesity Lifestyle changes advised   DVT prophylaxis: lovenox  Code Status: Full code  Family Communication:  None at bedside  Disposition Plan: Skilled nursing facility  Consultants: Neurology, cardiology  Procedures: Cardiac catheterization  Antimicrobials: None   Subjective: Pt reported feeling better today, denies any pain, no chest pain, SOB, abdominal pain.   Objective: Vitals:   06/18/18 1101 06/18/18 1125 06/18/18 1334 06/18/18 1449  BP: (!) 145/40 (!) 157/61  (!) 143/43  Pulse:   (!) 56 (!) 49  Resp:   18   Temp:    98 F (36.7 C)  TempSrc:    Oral  SpO2:   96%   Weight:      Height:        Intake/Output Summary (Last 24 hours) at 06/18/2018 1836 Last data filed at 06/18/2018 1300 Gross per 24 hour  Intake 720 ml  Output 450 ml  Net 270  ml   Filed Weights   06/15/18 0332 06/16/18 0609 06/17/18 0525  Weight: 121.8 kg 121.5 kg 120.9 kg    Examination: General exam: NAD Respiratory system: CTAB  Cardiovascular system: S1 & S2 present Gastrointestinal system: Soft, obese, NT, ND, BS present Extremities: No edema noted Skin: No rashes, lesions or ulcers,no icterus ,no pallor Psychiatry: Normal mood  Central  nervous system: No focal neurological deficits.  Data Reviewed: I have personally reviewed following labs and imaging studies  CBC: Recent Labs  Lab 06/12/18 0526 06/13/18 0411 06/16/18 0335 06/18/18 0353  WBC 10.5 11.5* 12.1* 14.0*  HGB 10.5* 10.3* 9.5* 9.7*  HCT 35.4* 34.8* 32.7* 33.5*  MCV 82.7 83.9 83.8 84.6  PLT 222 PLATELET CLUMPS NOTED ON SMEAR, UNABLE TO ESTIMATE 222 332   Basic Metabolic Panel: Recent Labs  Lab 06/13/18 0411 06/14/18 0537 06/15/18 0347 06/16/18 0335 06/18/18 0353  NA 137 136 139 139 135  K 4.5 4.2 4.4 4.4 4.9  CL 102 103 106 105 104  CO2 24 25 25 25 24   GLUCOSE 167* 202* 157* 175* 255*  BUN 30* 35* 35* 33* 36*  CREATININE 1.82* 2.15* 1.96* 1.70* 1.94*  CALCIUM 9.1 8.8* 9.0 9.2 8.9   GFR: Estimated Creatinine Clearance: 37.2 mL/min (A) (by C-G formula based on SCr of 1.94 mg/dL (H)). Liver Function Tests: No results for input(s): AST, ALT, ALKPHOS, BILITOT, PROT, ALBUMIN in the last 168 hours. No results for input(s): LIPASE, AMYLASE in the last 168 hours. No results for input(s): AMMONIA in the last 168 hours. Coagulation Profile: No results for input(s): INR, PROTIME in the last 168 hours. Cardiac Enzymes: No results for input(s): CKTOTAL, CKMB, CKMBINDEX, TROPONINI in the last 168 hours. BNP (last 3 results) No results for input(s): PROBNP in the last 8760 hours. HbA1C: No results for input(s): HGBA1C in the last 72 hours. CBG: Recent Labs  Lab 06/17/18 1603 06/17/18 2034 06/18/18 0802 06/18/18 1209 06/18/18 1643  GLUCAP 230* 173* 171* 204* 232*   Lipid Profile: No results for input(s): CHOL, HDL, LDLCALC, TRIG, CHOLHDL, LDLDIRECT in the last 72 hours. Thyroid Function Tests: No results for input(s): TSH, T4TOTAL, FREET4, T3FREE, THYROIDAB in the last 72 hours. Anemia Panel: No results for input(s): VITAMINB12, FOLATE, FERRITIN, TIBC, IRON, RETICCTPCT in the last 72 hours. Sepsis Labs: No results for input(s): PROCALCITON,  LATICACIDVEN in the last 168 hours.  No results found for this or any previous visit (from the past 240 hour(s)).   Radiology Studies: Ct Head Wo Contrast  Result Date: 06/17/2018 CLINICAL DATA:  Initial evaluation for acute headache with neck pain. EXAM: CT HEAD WITHOUT CONTRAST TECHNIQUE: Contiguous axial images were obtained from the base of the skull through the vertex without intravenous contrast. COMPARISON:  Prior CT from 06/10/2018. FINDINGS: Brain: Previously identified small left occipital infarcts not well seen. No new large vessel territory infarct. No acute intracranial hemorrhage. No mass lesion, midline shift or mass effect. No hydrocephalus. No extra-axial fluid collection. Cerebral volume stable, and remains within normal limits. Vascular: Calcified atherosclerosis at the skull base. No hyperdense vessel. Skull: Scalp soft tissues and calvarium demonstrate no acute finding. Sinuses/Orbits: Globes oral soft tissues within normal limits. Mild mucosal thickening within the right maxillary sinus. No mastoid effusion. Other: None. IMPRESSION: 1. No acute intracranial abnormality. 2. Recently identified small left occipital infarcts not seen by CT. Electronically Signed   By: Jeannine Boga M.D.   On: 06/17/2018 18:47    Scheduled Meds: . acyclovir  400  mg Oral Daily  . [START ON 06/19/2018] amLODipine  10 mg Oral Daily  . aspirin EC  81 mg Oral Daily  . clopidogrel  75 mg Oral Daily  . enoxaparin (LOVENOX) injection  30 mg Subcutaneous Q24H  . fluticasone  2 spray Each Nare Daily  . furosemide  20 mg Oral Daily  . gabapentin  100 mg Oral TID  . hydrALAZINE  25 mg Oral BID  . insulin aspart  0-5 Units Subcutaneous QHS  . insulin aspart  0-9 Units Subcutaneous TID WC  . insulin aspart  15 Units Subcutaneous TID WC  . insulin glargine  25 Units Subcutaneous BID  . isosorbide mononitrate  30 mg Oral Daily  . levothyroxine  100 mcg Oral QAC breakfast  . losartan  100 mg Oral  Daily  . mouth rinse  15 mL Mouth Rinse BID  . montelukast  10 mg Oral QHS  . pantoprazole  80 mg Oral Daily  . rosuvastatin  40 mg Oral QHS  . sodium chloride flush  3 mL Intravenous Q12H  . sodium chloride flush  3 mL Intravenous Q12H   Continuous Infusions:   LOS: 12 days   Time spent: More than 50% of that time was spent in counseling and/or coordination of care.  Alma Friendly, MD [facility]   If 7PM-7AM, please contact night-coverage [email or web address] 06/18/2018, 6:36 PM

## 2018-06-18 NOTE — Progress Notes (Signed)
*  Preliminary Results* Carotid artery duplex has been completed. Bilateral internal carotid arteries are 1-39% stenosis. Vertebral arteries are patent with antegrade flow.  06/18/2018 10:36 AM  Mary Raymond Dawna Part

## 2018-06-19 DIAGNOSIS — E1165 Type 2 diabetes mellitus with hyperglycemia: Secondary | ICD-10-CM | POA: Diagnosis not present

## 2018-06-19 DIAGNOSIS — I208 Other forms of angina pectoris: Secondary | ICD-10-CM | POA: Diagnosis not present

## 2018-06-19 DIAGNOSIS — Z7401 Bed confinement status: Secondary | ICD-10-CM | POA: Diagnosis not present

## 2018-06-19 DIAGNOSIS — D518 Other vitamin B12 deficiency anemias: Secondary | ICD-10-CM | POA: Diagnosis not present

## 2018-06-19 DIAGNOSIS — E039 Hypothyroidism, unspecified: Secondary | ICD-10-CM | POA: Diagnosis not present

## 2018-06-19 DIAGNOSIS — E7800 Pure hypercholesterolemia, unspecified: Secondary | ICD-10-CM | POA: Diagnosis not present

## 2018-06-19 DIAGNOSIS — J45909 Unspecified asthma, uncomplicated: Secondary | ICD-10-CM | POA: Diagnosis present

## 2018-06-19 DIAGNOSIS — R079 Chest pain, unspecified: Secondary | ICD-10-CM | POA: Diagnosis not present

## 2018-06-19 DIAGNOSIS — I63432 Cerebral infarction due to embolism of left posterior cerebral artery: Secondary | ICD-10-CM | POA: Diagnosis not present

## 2018-06-19 DIAGNOSIS — D631 Anemia in chronic kidney disease: Secondary | ICD-10-CM | POA: Diagnosis not present

## 2018-06-19 DIAGNOSIS — M2550 Pain in unspecified joint: Secondary | ICD-10-CM | POA: Diagnosis not present

## 2018-06-19 DIAGNOSIS — E119 Type 2 diabetes mellitus without complications: Secondary | ICD-10-CM | POA: Diagnosis not present

## 2018-06-19 DIAGNOSIS — E1159 Type 2 diabetes mellitus with other circulatory complications: Secondary | ICD-10-CM | POA: Diagnosis not present

## 2018-06-19 DIAGNOSIS — I441 Atrioventricular block, second degree: Secondary | ICD-10-CM | POA: Diagnosis not present

## 2018-06-19 DIAGNOSIS — E1142 Type 2 diabetes mellitus with diabetic polyneuropathy: Secondary | ICD-10-CM | POA: Diagnosis present

## 2018-06-19 DIAGNOSIS — D649 Anemia, unspecified: Secondary | ICD-10-CM | POA: Diagnosis not present

## 2018-06-19 DIAGNOSIS — R0602 Shortness of breath: Secondary | ICD-10-CM | POA: Diagnosis not present

## 2018-06-19 DIAGNOSIS — I69398 Other sequelae of cerebral infarction: Secondary | ICD-10-CM | POA: Diagnosis not present

## 2018-06-19 DIAGNOSIS — Z79899 Other long term (current) drug therapy: Secondary | ICD-10-CM | POA: Diagnosis not present

## 2018-06-19 DIAGNOSIS — R0789 Other chest pain: Secondary | ICD-10-CM | POA: Diagnosis not present

## 2018-06-19 DIAGNOSIS — G459 Transient cerebral ischemic attack, unspecified: Secondary | ICD-10-CM | POA: Diagnosis not present

## 2018-06-19 DIAGNOSIS — E559 Vitamin D deficiency, unspecified: Secondary | ICD-10-CM | POA: Diagnosis not present

## 2018-06-19 DIAGNOSIS — I129 Hypertensive chronic kidney disease with stage 1 through stage 4 chronic kidney disease, or unspecified chronic kidney disease: Secondary | ICD-10-CM | POA: Diagnosis not present

## 2018-06-19 DIAGNOSIS — F419 Anxiety disorder, unspecified: Secondary | ICD-10-CM | POA: Diagnosis not present

## 2018-06-19 DIAGNOSIS — N183 Chronic kidney disease, stage 3 (moderate): Secondary | ICD-10-CM | POA: Diagnosis not present

## 2018-06-19 DIAGNOSIS — R5381 Other malaise: Secondary | ICD-10-CM | POA: Diagnosis present

## 2018-06-19 DIAGNOSIS — E785 Hyperlipidemia, unspecified: Secondary | ICD-10-CM | POA: Diagnosis not present

## 2018-06-19 DIAGNOSIS — E860 Dehydration: Secondary | ICD-10-CM | POA: Diagnosis not present

## 2018-06-19 DIAGNOSIS — I4891 Unspecified atrial fibrillation: Secondary | ICD-10-CM | POA: Diagnosis not present

## 2018-06-19 DIAGNOSIS — I959 Hypotension, unspecified: Secondary | ICD-10-CM | POA: Diagnosis not present

## 2018-06-19 DIAGNOSIS — R001 Bradycardia, unspecified: Secondary | ICD-10-CM | POA: Diagnosis not present

## 2018-06-19 DIAGNOSIS — H538 Other visual disturbances: Secondary | ICD-10-CM | POA: Diagnosis not present

## 2018-06-19 DIAGNOSIS — I10 Essential (primary) hypertension: Secondary | ICD-10-CM | POA: Diagnosis not present

## 2018-06-19 DIAGNOSIS — I700 Atherosclerosis of aorta: Secondary | ICD-10-CM | POA: Diagnosis not present

## 2018-06-19 DIAGNOSIS — I158 Other secondary hypertension: Secondary | ICD-10-CM | POA: Diagnosis not present

## 2018-06-19 DIAGNOSIS — I639 Cerebral infarction, unspecified: Secondary | ICD-10-CM | POA: Diagnosis not present

## 2018-06-19 DIAGNOSIS — E1169 Type 2 diabetes mellitus with other specified complication: Secondary | ICD-10-CM | POA: Diagnosis not present

## 2018-06-19 DIAGNOSIS — I214 Non-ST elevation (NSTEMI) myocardial infarction: Secondary | ICD-10-CM | POA: Diagnosis not present

## 2018-06-19 DIAGNOSIS — Z6841 Body Mass Index (BMI) 40.0 and over, adult: Secondary | ICD-10-CM | POA: Diagnosis not present

## 2018-06-19 DIAGNOSIS — E1122 Type 2 diabetes mellitus with diabetic chronic kidney disease: Secondary | ICD-10-CM | POA: Diagnosis not present

## 2018-06-19 DIAGNOSIS — M7989 Other specified soft tissue disorders: Secondary | ICD-10-CM | POA: Diagnosis not present

## 2018-06-19 DIAGNOSIS — R7989 Other specified abnormal findings of blood chemistry: Secondary | ICD-10-CM | POA: Diagnosis not present

## 2018-06-19 DIAGNOSIS — R011 Cardiac murmur, unspecified: Secondary | ICD-10-CM | POA: Diagnosis not present

## 2018-06-19 DIAGNOSIS — Z955 Presence of coronary angioplasty implant and graft: Secondary | ICD-10-CM | POA: Diagnosis not present

## 2018-06-19 DIAGNOSIS — I259 Chronic ischemic heart disease, unspecified: Secondary | ICD-10-CM | POA: Diagnosis not present

## 2018-06-19 DIAGNOSIS — K219 Gastro-esophageal reflux disease without esophagitis: Secondary | ICD-10-CM | POA: Diagnosis present

## 2018-06-19 DIAGNOSIS — Z959 Presence of cardiac and vascular implant and graft, unspecified: Secondary | ICD-10-CM | POA: Diagnosis not present

## 2018-06-19 DIAGNOSIS — I442 Atrioventricular block, complete: Secondary | ICD-10-CM | POA: Diagnosis not present

## 2018-06-19 DIAGNOSIS — E782 Mixed hyperlipidemia: Secondary | ICD-10-CM | POA: Diagnosis not present

## 2018-06-19 DIAGNOSIS — I25110 Atherosclerotic heart disease of native coronary artery with unstable angina pectoris: Secondary | ICD-10-CM | POA: Diagnosis not present

## 2018-06-19 DIAGNOSIS — I200 Unstable angina: Secondary | ICD-10-CM | POA: Diagnosis not present

## 2018-06-19 DIAGNOSIS — I82622 Acute embolism and thrombosis of deep veins of left upper extremity: Secondary | ICD-10-CM | POA: Diagnosis not present

## 2018-06-19 DIAGNOSIS — M797 Fibromyalgia: Secondary | ICD-10-CM | POA: Diagnosis not present

## 2018-06-19 DIAGNOSIS — R609 Edema, unspecified: Secondary | ICD-10-CM | POA: Diagnosis not present

## 2018-06-19 LAB — CBC WITH DIFFERENTIAL/PLATELET
ABS IMMATURE GRANULOCYTES: 0.2 10*3/uL — AB (ref 0.0–0.1)
BASOS ABS: 0.1 10*3/uL (ref 0.0–0.1)
Basophils Relative: 0 %
Eosinophils Absolute: 0.2 10*3/uL (ref 0.0–0.7)
Eosinophils Relative: 2 %
HCT: 33.6 % — ABNORMAL LOW (ref 36.0–46.0)
Hemoglobin: 9.8 g/dL — ABNORMAL LOW (ref 12.0–15.0)
IMMATURE GRANULOCYTES: 2 %
LYMPHS PCT: 24 %
Lymphs Abs: 2.9 10*3/uL (ref 0.7–4.0)
MCH: 24.9 pg — ABNORMAL LOW (ref 26.0–34.0)
MCHC: 29.2 g/dL — ABNORMAL LOW (ref 30.0–36.0)
MCV: 85.3 fL (ref 78.0–100.0)
Monocytes Absolute: 0.9 10*3/uL (ref 0.1–1.0)
Monocytes Relative: 7 %
NEUTROS ABS: 7.9 10*3/uL — AB (ref 1.7–7.7)
Neutrophils Relative %: 65 %
Platelets: 242 10*3/uL (ref 150–400)
RBC: 3.94 MIL/uL (ref 3.87–5.11)
RDW: 16.5 % — ABNORMAL HIGH (ref 11.5–15.5)
WBC: 12 10*3/uL — AB (ref 4.0–10.5)

## 2018-06-19 LAB — BASIC METABOLIC PANEL
ANION GAP: 6 (ref 5–15)
BUN: 40 mg/dL — ABNORMAL HIGH (ref 8–23)
CO2: 26 mmol/L (ref 22–32)
Calcium: 8.8 mg/dL — ABNORMAL LOW (ref 8.9–10.3)
Chloride: 104 mmol/L (ref 98–111)
Creatinine, Ser: 1.8 mg/dL — ABNORMAL HIGH (ref 0.44–1.00)
GFR, EST AFRICAN AMERICAN: 33 mL/min — AB (ref >60)
GFR, EST NON AFRICAN AMERICAN: 28 mL/min — AB (ref >60)
Glucose, Bld: 176 mg/dL — ABNORMAL HIGH (ref 70–99)
Potassium: 4.7 mmol/L (ref 3.5–5.1)
SODIUM: 136 mmol/L (ref 135–145)

## 2018-06-19 LAB — GLUCOSE, CAPILLARY
GLUCOSE-CAPILLARY: 154 mg/dL — AB (ref 70–99)
GLUCOSE-CAPILLARY: 273 mg/dL — AB (ref 70–99)

## 2018-06-19 MED ORDER — GABAPENTIN 100 MG PO CAPS: 100.0000 mg | ORAL_CAPSULE | Freq: Three times a day (TID) | ORAL | Status: DC

## 2018-06-19 NOTE — Progress Notes (Signed)
Occupational Therapy Treatment Patient Details Name: Mary Raymond MRN: [id number] DOB: 09-Jan-1951 Today's Date: 06/19/2018    History of present illness 67 y.o. female admitted to [facility] 9/19 with NSTEMI with heart cath on 9/20, and pt with right visual loss (9/22) and MRI showing small subcentimeter left occipital infarcts. PMHx:  HTN, HLD, type II DM, obesity, gout   OT comments  Pt making good progress with adls. Pt able to complete most simple adls tasks without physical assist.  Pt has such poor endurance and activity tolerance that once she does one or two tasks..she then needs assist because she so fatigued.  Hoping pt will increase activity tolerance so she can eventually d/c back home alone.   Follow Up Recommendations  Supervision/Assistance - 24 hour;SNF    Equipment Recommendations  None recommended by OT    Recommendations for Other Services      Precautions / Restrictions Precautions Precautions: Fall Restrictions Weight Bearing Restrictions: No       Mobility Bed Mobility Overal bed mobility: Modified Independent                Transfers Overall transfer level: Needs assistance Equipment used: Rolling walker (2 wheeled) Transfers: Sit to/from Stand Sit to Stand: Supervision         General transfer comment: increased time and effort.      Balance Overall balance assessment: Needs assistance Sitting-balance support: No upper extremity supported;Feet supported Sitting balance-Leahy Scale: Good Sitting balance - Comments: Able to maintain static sitting   Standing balance support: Bilateral upper extremity supported;During functional activity Standing balance-Leahy Scale: Fair Standing balance comment: can release walker in static standing without LOB                           ADL either performed or assessed with clinical judgement   ADL Overall ADL's : Needs assistance/impaired Eating/Feeding: Independent;Sitting    Grooming: Wash/dry hands;Wash/dry face;Oral care;Supervision/safety;Standing Grooming Details (indicate cue type and reason): Pt stood at sink and groomed for appx 6 minutes without physical assist ro cues.  [place] appears better.  Pt fatigues quickly and states she felt "woozy" but was able to finish task without physical assist. Upper Body Bathing: Set up;Sitting   Lower Body Bathing: Minimal assistance;Sit to/from stand;Cueing for compensatory techniques Lower Body Bathing Details (indicate cue type and reason): Pt can reach all body parts to bathe.  Requires extra time and min guard when standing. Upper Body Dressing : Minimal assistance;Sitting   Lower Body Dressing: Min guard;Sit to/from stand Lower Body Dressing Details (indicate cue type and reason): Pt able to donn socks and pants with extra time today.  Pt fatigues quickly. Toilet Transfer: Min guard;Ambulation;RW;Comfort height toilet;Grab bars Toilet Transfer Details (indicate cue type and reason): Pt walked to bathroom to toilet with walker and did not require physical assist.  Rest breaks needed. Toileting- Water quality scientist and Hygiene: Min guard;Sit to/from stand Toileting - Clothing Manipulation Details (indicate cue type and reason): vision appeared better during all toileting tasks.     Functional mobility during ADLs: Min guard;Rolling walker General ADL Comments: Pt has ability to complete most adls but is most limited with her activity tolerance.       Vision       Perception     Praxis      Cognition Arousal/Alertness: Awake/alert Behavior During Therapy: WFL for tasks assessed/performed Overall Cognitive Status: Within Functional Limits for tasks assessed  Exercises     Shoulder Instructions       General Comments Pt has the ability to complete most adl tasks but does not have the activity tolerance to be functional at home with adls.     Pertinent Vitals/ Pain       Pain Assessment: No/denies pain  Home Living                                          Prior Functioning/Environment              Frequency  Min 2X/week        Progress Toward Goals  OT Goals(current goals can now be found in the care plan section)  Progress towards OT goals: Progressing toward goals  Acute Rehab OT Goals Patient Stated Goal: I am ready to go to rehab OT Goal Formulation: With patient Time For Goal Achievement: 06/23/18 Potential to Achieve Goals: Good ADL Goals Pt Will Perform Grooming: with modified independence;standing Pt Will Perform Lower Body Dressing: with set-up;with supervision;sit to/from stand Pt Will Transfer to Toilet: with set-up;with supervision;bedside commode;ambulating Pt Will Perform Toileting - Clothing Manipulation and hygiene: with set-up;with supervision;sit to/from stand;sitting/lateral leans Pt Will Perform Tub/Shower Transfer: Tub transfer;ambulating;with min guard assist;shower seat Additional ADL Goal #1: Pt will demonstrate compensatory vision techniques during ADLs with 2-3 cues.  Plan Discharge plan remains appropriate    Co-evaluation                 AM-PAC PT "6 Clicks" Daily Activity     Outcome Measure   Help from another person eating meals?: None Help from another person taking care of personal grooming?: A Little Help from another person toileting, which includes using toliet, bedpan, or urinal?: A Little Help from another person bathing (including washing, rinsing, drying)?: A Little Help from another person to put on and taking off regular upper body clothing?: A Little Help from another person to put on and taking off regular lower body clothing?: A Little 6 Click Score: 19    End of Session Equipment Utilized During Treatment: Gait belt;Rolling walker  OT Visit Diagnosis: Unsteadiness on feet (R26.81);Other abnormalities of gait and mobility  (R26.89);Muscle weakness (generalized) (M62.81);Other symptoms and signs involving cognitive function;Low vision, both eyes (H54.2)   Activity Tolerance Patient limited by fatigue   Patient Left in chair;with call bell/phone within reach   Nurse Communication Mobility status;Other (comment)(spoke with nursing and left O2 off as pt at 95% on RA)        Time: 2993-7169 OT Time Calculation (min): 22 min  Charges: OT General Charges $OT Visit: 1 Visit OT Treatments $Self Care/Home Management : 8-22 mins  Jinger Neighbors, OTR/L [phone]   Glenford Peers 06/19/2018, 10:28 AM

## 2018-06-19 NOTE — Clinical Social Work Placement (Signed)
   CLINICAL SOCIAL WORK PLACEMENT  NOTE  Date:  06/19/2018  Patient Details  Name: Mary Raymond MRN: [id number] Date of Birth: [birth year]  Clinical Social Work is seeking post-discharge placement for this patient at the [place] level of care (*CSW will initial, date and re-position this form in  chart as items are completed):  Yes   Patient/family provided with [facility] Work Department's list of facilities offering this level of care within the geographic area requested by the patient (or if unable, by the patient's family).  Yes   Patient/family informed of their freedom to choose among providers that offer the needed level of care, that participate in Medicare, Medicaid or managed care program needed by the patient, have an available bed and are willing to accept the patient.  Yes   Patient/family informed of [facility]'s ownership interest in [facility] and [facility], as well as of the fact that they are under no obligation to receive care at these facilities.  PASRR submitted to EDS on 06/09/18     PASRR number received on 06/09/18     Existing PASRR number confirmed on       FL2 transmitted to all facilities in geographic area requested by pt/family on 06/09/18     FL2 transmitted to all facilities within larger geographic area on       Patient informed that his/her managed care company has contracts with or will negotiate with certain facilities, including the following:  [employer] informed of bed offers received.  Patient chooses bed at [facility]     Physician recommends and patient chooses bed at      Patient to be transferred to [facility] on 06/19/18.  Patient to be transferred to facility by [facility]     Patient family notified on 06/19/18 of transfer.  Name of family member notified:  Surena Welge, daughter     PHYSICIAN Please prepare priority discharge summary,  including medications, Please prepare prescriptions     Additional Comment:    _______________________________________________ [name], LCSW 06/19/2018, 11:00 AM

## 2018-06-19 NOTE — Discharge Summary (Signed)
Discharge Summary  ORVILLA TRUETT QQV:[id number] DOB: Apr 09, 1951  [name], FNP  Admit date: 06/05/2018 Discharge date: 06/19/2018  Time spent: 35 mins  Recommendations for Outpatient Follow-up:  1. PCP 2. Cardiology  3. Neurology  Discharge Diagnoses:  Active Hospital Problems   Diagnosis Date Noted  . Chest pain 06/05/2018  . Cerebral embolism with cerebral infarction 06/09/2018  . NSTEMI (non-ST elevated myocardial infarction) ([place])   . Elevated troponin 06/05/2018  . Hyperlipidemia associated with type 2 diabetes mellitus ([place])   . Obesity, morbid, BMI 40.0-49.9 ([place])   . Hypertension associated with diabetes ([place])   . Hypothyroidism 02/25/2013    Resolved Hospital Problems  No resolved problems to display.    Discharge Condition: Stable  Diet recommendation: Heart healthy/mod carb  Vitals:   06/18/18 2102 06/19/18 0519  BP: (!) 148/54 (!) 158/51  Pulse: (!) 43 (!) 39  Resp:    Temp: 97.6 F (36.4 C) 97.7 F (36.5 C)  SpO2: 94% 95%    History of present illness:  Mrs. Emanuele is a67 y.o.F with fibromyalgia, morbid obesity, HTN, DM who presentned with chest pain, found have NSTEMI. 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 Hospital course complicated by a small left PCA infarct-found on MRI on 9/22 after patient complained of decreased vision in the left eye.   Today, pt reported feeling much better. Denies any generalized pain, no chest pain, worsening SOB, abdominal pain, N/V, fever/chills. Pt stable to d/c to SNF.  Hospital Course:  Principal Problem:   Chest pain Active Problems:   Hypothyroidism   Hypertension associated with diabetes ([place])   Hyperlipidemia associated with type 2 diabetes mellitus ([facility])   Obesity, morbid, BMI 40.0-49.9 ([facility])   Elevated troponin   NSTEMI (non-ST elevated myocardial infarction) ([place])   Cerebral embolism with cerebral infarction    NSTEMI Currently  chest pain-free LHC on 9/20 showed three-vessel disease, not felt to be a candidate for CABG by CT surgery, subsequently went back to the Cath Lab and had PCI to RCA Continue with aspirin/Plavix and statin on discharge Not on beta-blocker given Mobitz 1 second-degree heart block Follow-up with cardiology in 1 week after discharge  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 daily has been resumed  Acute left PCA stroke Evaluated by neurology-underwent work-up-CTA head/neck did not show any significant stenosis, echocardiogram showed preserved EF without any embolic source. Remains on aspirin/Plavix and statin Outpatient follow-up with neurology Neurology also recommending a 30-day cardiac event monitoring as outpatient to rule out A. Fib.  Dyslipidemia Continue statin  Hypertension Continue amlodipine, Imdur, hydralazine and losartan  DM-2: Continue home regimen, SSI  Ambulatory dysfunction/dizziness Improving May be secondary to CVA-continue PT at SNF  Morbid obesity Lifestyle changes advised   Procedures:  Cardiac cath  Consultations:  Neurology  Cardiology  Discharge Exam: BP (!) 158/51 (BP Location: Left Wrist)   Pulse (!) 39   Temp 97.7 F (36.5 C) (Oral)   Resp 18   Ht 5\' 5"  (1.651 m)   Wt 121.2 kg   SpO2 95%   BMI 44.46 kg/m   General: NAD  Cardiovascular: S1, S2 present  Respiratory: CTAB  Discharge Instructions You were cared for by a  hospitalist during your hospital stay. If you have any questions about your discharge medications or the care you received while you were in the hospital after you are discharged, you can call the unit and asked to speak with the hospitalist on call if the  hospitalist that took care of you is not available. Once you are discharged, your primary care physician will handle any further medical issues. Please note that NO REFILLS for any discharge medications will be authorized once you are discharged, as it is imperative that you return to your primary care physician (or establish a relationship with a primary care physician if you do not have one) for your aftercare needs so that they can reassess your need for medications and monitor your lab values.  Discharge Instructions    Amb Referral to Cardiac Rehabilitation   Complete by:  As directed    Diagnosis:   NSTEMI Coronary Stents     Diet - low sodium heart healthy   Complete by:  As directed    Diet - low sodium heart healthy   Complete by:  As directed    Diet Carb Modified   Complete by:  As directed    Discharge instructions   Complete by:  As directed    Follow with Primary MD  [name], FNP in 1 week  Follow with Cardiology as instructed  Please get a complete blood count and chemistry panel checked by your Primary MD at your next visit, and again as instructed by your Primary MD.  Get Medicines reviewed and adjusted: Please take all your medications with you for your next visit with your Primary MD  Laboratory/radiological data: Please request your Primary MD to go over all hospital tests and procedure/radiological results at the follow up, please ask your Primary MD to get all Hospital records sent to his/her office.  In some cases, they will be blood work, cultures and biopsy results pending at the time of your discharge. Please request that your primary care M.D. follows up on these results.  Also Note the following: If you experience worsening of your admission symptoms, develop shortness of breath, life threatening emergency, suicidal or homicidal thoughts you must seek medical attention immediately by calling 911 or calling your MD immediately  if symptoms less  severe.  You must read complete instructions/literature along with all the possible adverse reactions/side effects for all the Medicines you take and that have been prescribed to you. Take any new Medicines after you have completely understood and accpet all the possible adverse reactions/side effects.   Do not drive when taking Pain medications or sleeping medications (Benzodaizepines)  Do not take more than prescribed Pain, Sleep and Anxiety Medications. It is not advisable to combine anxiety,sleep and pain medications without talking with your primary care practitioner  Special Instructions: If you have smoked or chewed Tobacco  in the last 2 yrs please stop smoking, stop any regular Alcohol  and or any Recreational drug use.  Wear Seat belts while driving.  Please note: You were cared for by a hospitalist during your hospital stay. Once you are discharged, your primary care physician will handle any further medical issues. Please note that NO REFILLS for any discharge medications will be authorized once you are discharged, as it is imperative that you return to your primary care physician (or establish a relationship with a primary care physician if you do not have one) for your post hospital discharge needs so that they can reassess your  need for medications and monitor your lab values.   Discharge instructions   Complete by:  As directed    Follow up with primary care physician at the SNF in 3-5 days. Continue medications as prescribed.   Increase activity slowly   Complete by:  As directed    Increase activity slowly   Complete by:  As directed      Allergies as of 06/19/2018      Reactions   Atorvastatin Other (See Comments)   Leg weakness.      Medication List    STOP taking these medications   metFORMIN 500 MG tablet Commonly known as:  GLUCOPHAGE   metoprolol succinate 25 MG 24 hr tablet Commonly known as:  TOPROL-XL   NOVOLOG FLEXPEN 100 UNIT/ML FlexPen Generic drug:   insulin aspart Replaced by:  insulin aspart 100 UNIT/ML injection     TAKE these medications   ACCU-CHEK SOFTCLIX LANCETS lancets Test BG TID   acyclovir 400 MG tablet Commonly known as:  ZOVIRAX TAKE 1 TABLET EVERY DAY   albuterol 108 (90 Base) MCG/ACT inhaler Commonly known as:  PROVENTIL HFA;VENTOLIN HFA Inhale 1-2 puffs into the lungs every 6 (six) hours as needed for wheezing or shortness of breath.   ALPRAZolam 0.5 MG tablet Commonly known as:  XANAX TAKE 1 TABLET 2 TIMES A DAY AS NEEDED FOR ANXIETY   amLODipine 10 MG tablet Commonly known as:  NORVASC TAKE 1 TABLET EVERY DAY   aspirin 81 MG EC tablet Take 1 tablet (81 mg total) by mouth daily.   B-D UF III MINI PEN NEEDLES 31G X 5 MM Misc Generic drug:  Insulin Pen Needle USE AS INSTRUCTED TO GIVE LANTUS INJECTION TWICE DAILY   clopidogrel 75 MG tablet Commonly known as:  PLAVIX Take 1 tablet (75 mg total) by mouth daily.   fluticasone 50 MCG/ACT nasal spray Commonly known as:  FLONASE USE 2 SPRAYS IN EACH NOSTRIL EVERY DAY   furosemide 20 MG tablet Commonly known as:  LASIX TAKE 1 TABLET (20 MG TOTAL) BY MOUTH DAILY.   gabapentin 100 MG capsule Commonly known as:  NEURONTIN Take 1 capsule (100 mg total) by mouth 3 (three) times daily.   hydrALAZINE 25 MG tablet Commonly known as:  APRESOLINE Take 1 tablet (25 mg total) by mouth 2 (two) times daily.   HYDROcodone-acetaminophen 7.5-325 MG tablet Commonly known as:  NORCO Take 1 tablet by mouth every 6 (six) hours as needed for moderate pain. What changed:    when to take this  reasons to take this   insulin aspart 100 UNIT/ML injection Commonly known as:  novoLOG CBG < 70: implement hypoglycemia protocol CBG 70 - 120: 0 units CBG 121 - 150: 0 units CBG 151 - 200: 0 units CBG 201 - 250: 2 units CBG 251 - 300: 3 units CBG 301 - 350: 4 units CBG 351 - 400: 5 units CBG > 400: call MD Replaces:  NOVOLOG FLEXPEN 100 UNIT/ML FlexPen   Insulin  Glargine 100 UNIT/ML Solostar Pen Commonly known as:  LANTUS Inject 25 Units into the skin 2 (two) times daily. What changed:  See the new instructions.   Ipratropium-Albuterol 20-100 MCG/ACT Aers respimat Commonly known as:  COMBIVENT Inhale 1 puff into the lungs every 6 (six) hours as needed for wheezing.   isosorbide mononitrate 30 MG 24 hr tablet Commonly known as:  IMDUR Take 1 tablet (30 mg total) by mouth daily.   levothyroxine 50 MCG tablet Commonly known  as:  SYNTHROID, LEVOTHROID TAKE 1 TABLET EVERY DAY BEFORE BREAKFAST   losartan 100 MG tablet Commonly known as:  COZAAR TAKE 1 TABLET EVERY DAY   montelukast 10 MG tablet Commonly known as:  SINGULAIR TAKE 1 TABLET (10 MG TOTAL) BY MOUTH AT BEDTIME.   omeprazole 40 MG capsule Commonly known as:  PRILOSEC TAKE 1 CAPSULE EVERY DAY   rosuvastatin 20 MG tablet Commonly known as:  CRESTOR Take 2 tablets (40 mg total) by mouth at bedtime. What changed:  how much to take      Allergies  Allergen Reactions  . Atorvastatin Other (See Comments)    Leg weakness.    Contact information for follow-up providers    [facility]. Schedule an appointment as soon as possible for a visit in 4 week(s).   Specialty:  Radiology Contact information: [address] [place] [phone]       [name], [place]. Schedule an appointment as soon as possible for a visit in 1 week(s).   Specialty:  Family Medicine Contact information: [place] [address] [phone]        Arnoldo Lenis, MD. Schedule an appointment as soon as possible for a visit in 2 week(s).   Specialty:  Cardiology Contact information: [address] [place] [address] [phone]            Contact information for after-discharge care    Destination    [facility] .   Service:  Skilled Nursing Contact information: [place] [phone]                   The results of significant diagnostics from this hospitalization (including imaging, microbiology, ancillary and laboratory) are listed below for reference.    Significant Diagnostic Studies: Ct Angio Head W Or Wo Contrast  Result Date: 06/10/2018 CLINICAL DATA:  67 y/o F; status post NSTEMI and catheterization 06/07/2018. 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 Patient reports blacking out of vision in the lateral quadrant of the right eye. EXAM: CT ANGIOGRAPHY HEAD AND NECK TECHNIQUE: Multidetector CT imaging of the head and neck was performed using the standard protocol during bolus administration of intravenous contrast. Multiplanar CT image reconstructions and MIPs were obtained to evaluate the vascular anatomy. Carotid stenosis measurements (when applicable) are obtained utilizing NASCET criteria, using the distal internal carotid diameter as the denominator. CONTRAST:  50 cc Isovue 370 COMPARISON:  06/08/2018 MRI of the head. FINDINGS: CT HEAD FINDINGS Brain: Faint lucency in the left occipital lobe corresponding to small infarct on prior MRI. No new stroke, hemorrhage, focal mass effect, extra-axial collection, hydrocephalus, or herniation. Few nonspecific hypodensities in white matter are compatible with mild chronic microvascular ischemic changes and there is mild volume loss of the brain. Vascular: As below. Skull: Normal. Negative for fracture or focal lesion. Sinuses: Imaged portions are clear. Orbits: No acute finding. Review of the MIP images confirms the above findings CTA NECK FINDINGS Aortic arch: Standard branching. Imaged portion shows no evidence of aneurysm or dissection. No significant stenosis of the major arch vessel origins. Moderate aortic calcific atherosclerosis. Right carotid system: No evidence of dissection, stenosis (50% or greater) or occlusion. Calcified plaque of the carotid  bifurcation with mild less than 50% proximal ICA stenosis. Left carotid system: No evidence of dissection, stenosis (50% or greater) or occlusion. Mixed plaque of  the carotid bifurcation with mild less than 50% proximal ICA stenosis. Vertebral arteries: Codominant. No evidence of dissection, stenosis (50% or greater) or occlusion. Skeleton: Mild cervical spondylosis. No high-grade bony canal stenosis. Other neck: Right upper paratracheal lymphadenopathy measuring 18 x 15 mm (series 8, image 34). Upper chest: Negative. Review of the MIP images confirms the above findings CTA HEAD FINDINGS Anterior circulation: No significant stenosis, proximal occlusion, aneurysm, or vascular malformation. Calcified plaque of the carotid siphons with mild less less than 50% paraclinoid stenosis, greater on the left. Posterior circulation: No high-grade stenosis, proximal occlusion, aneurysm, or vascular malformation. Right vertebral artery calcified plaque with mild to moderate approximately 50% stenosis. Left vertebral artery calcified plaque with mild less than 50% stenosis. Venous sinuses: As permitted by contrast timing, patent. Anatomic variants: None significant. Delayed phase: No abnormal intracranial enhancement. Review of the MIP images confirms the above findings IMPRESSION: 1. Patent carotid and vertebral arteries. No dissection, aneurysm, or hemodynamically significant stenosis utilizing NASCET criteria. 2. Patent anterior and posterior intracranial circulation. No large vessel occlusion, aneurysm, or high-grade stenosis. 3. Atherosclerosis of the aorta, carotid bifurcations, carotid siphons, and bilateral intracranial vertebral arteries. 4. Small left occipital lobe acute infarction better characterized on prior MRI. No new acute intracranial abnormality on noncontrast CT of head. No abnormal enhancement of the brain. 5. Right upper paratracheal lymphadenopathy of uncertain significance. 6. Mild cervical spine  spondylosis. Electronically Signed   By: Kristine Garbe M.D.   On: 06/10/2018 01:19   Dg Chest 2 View  Result Date: 06/05/2018 CLINICAL DATA:  Chest pain EXAM: CHEST - 2 VIEW COMPARISON:  12/01/2014 FINDINGS: There is no focal parenchymal opacity. There is no pleural effusion or pneumothorax. There is stable cardiomegaly. There is thoracic aortic atherosclerosis. The osseous structures are unremarkable. IMPRESSION: No active cardiopulmonary disease. Electronically Signed   By: Kathreen Devoid   On: 06/05/2018 15:50   Ct Head Wo Contrast  Result Date: 06/17/2018 CLINICAL DATA:  Initial evaluation for acute headache with neck pain. EXAM: CT HEAD WITHOUT CONTRAST TECHNIQUE: Contiguous axial images were obtained from the base of the skull through the vertex without intravenous contrast. COMPARISON:  Prior CT from 06/10/2018. FINDINGS: Brain: Previously identified small left occipital infarcts not well seen. No new large vessel territory infarct. No acute intracranial hemorrhage. No mass lesion, midline shift or mass effect. No hydrocephalus. No extra-axial fluid collection. Cerebral volume stable, and remains within normal limits. Vascular: Calcified atherosclerosis at the skull base. No hyperdense vessel. Skull: Scalp soft tissues and calvarium demonstrate no acute finding. Sinuses/Orbits: Globes oral soft tissues within normal limits. Mild mucosal thickening within the right maxillary sinus. No mastoid effusion. Other: None. IMPRESSION: 1. No acute intracranial abnormality. 2. Recently identified small left occipital infarcts not seen by CT. Electronically Signed   By: Jeannine Boga M.D.   On: 06/17/2018 18:47   Ct Head Wo Contrast  Result Date: 06/07/2018 CLINICAL DATA:  67 year old female with altered mental status and nausea. EXAM: CT HEAD WITHOUT CONTRAST TECHNIQUE: Contiguous axial images were obtained from the base of the skull through the vertex without intravenous contrast.  COMPARISON:  Head CT 09/12/2017. FINDINGS: Brain: Cerebral volume is stable and within normal limits for age. No midline shift, ventriculomegaly, mass effect, evidence of mass lesion, intracranial hemorrhage or evidence of cortically based acute infarction. Gray-white matter differentiation is within normal limits throughout the brain. Vascular: Calcified atherosclerosis at the skull base. No suspicious intracranial vascular hyperdensity. Skull: Hyperostosis, compatible with normal variant. Bone mineralization  is within normal limits. No acute osseous abnormality identified. Sinuses/Orbits: Visualized paranasal sinuses and mastoids are stable and well pneumatized. Other: No acute orbit or scalp soft tissue findings. IMPRESSION: Stable and normal for age non contrast CT appearance of the brain. Electronically Signed   By: Genevie Ann M.D.   On: 06/07/2018 12:46   Ct Angio Neck W Or Wo Contrast  Result Date: 06/10/2018 CLINICAL DATA:  67 y/o F; status post NSTEMI and catheterization 06/07/2018. 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 Patient reports blacking out of vision in the lateral quadrant of the right eye. EXAM: CT ANGIOGRAPHY HEAD AND NECK TECHNIQUE: Multidetector CT imaging of the head and neck was performed using the standard protocol during bolus administration of intravenous contrast. Multiplanar CT image reconstructions and MIPs were obtained to evaluate the vascular anatomy. Carotid stenosis measurements (when applicable) are obtained utilizing NASCET criteria, using the distal internal carotid diameter as the denominator. CONTRAST:  50 cc Isovue 370 COMPARISON:  06/08/2018 MRI of the head. FINDINGS: CT HEAD FINDINGS Brain: Faint lucency in the left occipital lobe corresponding to small infarct on prior MRI. No new stroke, hemorrhage, focal mass effect, extra-axial collection, hydrocephalus, or herniation. Few nonspecific hypodensities in white matter are compatible with mild chronic  microvascular ischemic changes and there is mild volume loss of the brain. Vascular: As below. Skull: Normal. Negative for fracture or focal lesion. Sinuses: Imaged portions are clear. Orbits: No acute finding. Review of the MIP images confirms the above findings CTA NECK FINDINGS Aortic arch: Standard branching. Imaged portion shows no evidence of aneurysm or dissection. No significant stenosis of the major arch vessel origins. Moderate aortic calcific atherosclerosis. Right carotid system: No evidence of dissection, stenosis (50% or greater) or occlusion. Calcified plaque of the carotid bifurcation with mild less than 50% proximal ICA stenosis. Left carotid system: No evidence of dissection, stenosis (50% or greater) or occlusion. Mixed plaque of the carotid bifurcation with mild less than 50% proximal ICA stenosis. Vertebral arteries: Codominant. No evidence of dissection, stenosis (50% or greater) or occlusion. Skeleton: Mild cervical spondylosis. No high-grade bony canal stenosis. Other neck: Right upper paratracheal lymphadenopathy measuring 18 x 15 mm (series 8, image 34). Upper chest: Negative. Review of the MIP images confirms the above findings CTA HEAD FINDINGS Anterior circulation: No significant stenosis, proximal occlusion, aneurysm, or vascular malformation. Calcified plaque of the carotid siphons with mild less less than 50% paraclinoid stenosis, greater on the left. Posterior circulation: No high-grade stenosis, proximal occlusion, aneurysm, or vascular malformation. Right vertebral artery calcified plaque with mild to moderate approximately 50% stenosis. Left vertebral artery calcified plaque with mild less than 50% stenosis. Venous sinuses: As permitted by contrast timing, patent. Anatomic variants: None significant. Delayed phase: No abnormal intracranial enhancement. Review of the MIP images confirms the above findings IMPRESSION: 1. Patent carotid and vertebral arteries. No dissection,  aneurysm, or hemodynamically significant stenosis utilizing NASCET criteria. 2. Patent anterior and posterior intracranial circulation. No large vessel occlusion, aneurysm, or high-grade stenosis. 3. Atherosclerosis of the aorta, carotid bifurcations, carotid siphons, and bilateral intracranial vertebral arteries. 4. Small left occipital lobe acute infarction better characterized on prior MRI. No new acute intracranial abnormality on noncontrast CT of head. No abnormal enhancement of the brain. 5. Right upper paratracheal lymphadenopathy of uncertain significance. 6. Mild cervical spine spondylosis. Electronically Signed   By: Kristine Garbe M.D.   On: 06/10/2018 01:19   Mr Brain Wo Contrast  Result Date: 06/08/2018 CLINICAL  DATA:  Altered mental status, nausea. Suspect stroke. History of hypertension, hyperlipidemia, diabetes. EXAM: MRI HEAD WITHOUT CONTRAST TECHNIQUE: Multiplanar, multiecho pulse sequences of the brain and surrounding structures were obtained without intravenous contrast. COMPARISON:  CT HEAD June 07, 2018. FINDINGS: INTRACRANIAL CONTENTS: Subcentimeter foci reduced diffusion LEFT occipital lobe with low ADC values. No susceptibility artifact to suggest hemorrhage. Ventricles and sulci are normal for patient's age. A fused subcentimeter supratentorial and patchy pontine white matter FLAIR T2 hyperintensities. No midline shift, mass effect or masses. No abnormal extra-axial fluid collections. VASCULAR: Normal major intracranial vascular flow voids present at skull base. Loss of LEFT mid to distal posterior cerebral artery flow void versus artifact. SKULL AND UPPER CERVICAL SPINE: No abnormal sellar expansion. No suspicious calvarial bone marrow signal. Craniocervical junction maintained. SINUSES/ORBITS: The mastoid air-cells and included paranasal sinuses are well-aerated.The included ocular globes and orbital contents are non-suspicious. OTHER: None. IMPRESSION: 1. Acute small  LEFT occipital lobe/PCA territory infarct. 2. Occluded LEFT mid to distal PCA versus artifact. Consider CTA versus MRA head. 3. Mild chronic small vessel ischemic changes. 4. These results will be called to the ordering clinician or representative by the Radiologist Assistant, and communication documented in the PACS or zVision Dashboard. Electronically Signed   By: Elon Alas M.D.   On: 06/08/2018 13:23    Microbiology: No results found for this or any previous visit (from the past 240 hour(s)).   Labs: Basic Metabolic Panel: Recent Labs  Lab 06/14/18 0537 06/15/18 0347 06/16/18 0335 06/18/18 0353 06/19/18 0427  NA 136 139 139 135 136  K 4.2 4.4 4.4 4.9 4.7  CL 103 106 105 104 104  CO2 25 25 25 24 26   GLUCOSE 202* 157* 175* 255* 176*  BUN 35* 35* 33* 36* 40*  CREATININE 2.15* 1.96* 1.70* 1.94* 1.80*  CALCIUM 8.8* 9.0 9.2 8.9 8.8*   Liver Function Tests: No results for input(s): AST, ALT, ALKPHOS, BILITOT, PROT, ALBUMIN in the last 168 hours. No results for input(s): LIPASE, AMYLASE in the last 168 hours. No results for input(s): AMMONIA in the last 168 hours. CBC: Recent Labs  Lab 06/13/18 0411 06/16/18 0335 06/18/18 0353 06/19/18 0427  WBC 11.5* 12.1* 14.0* 12.0*  NEUTROABS  --   --   --  7.9*  HGB 10.3* 9.5* 9.7* 9.8*  HCT 34.8* 32.7* 33.5* 33.6*  MCV 83.9 83.8 84.6 85.3  PLT PLATELET CLUMPS NOTED ON SMEAR, UNABLE TO ESTIMATE 222 262 242   Cardiac Enzymes: No results for input(s): CKTOTAL, CKMB, CKMBINDEX, TROPONINI in the last 168 hours. BNP: BNP (last 3 results) Recent Labs    06/05/18 1526  BNP 197.0*    ProBNP (last 3 results) No results for input(s): PROBNP in the last 8760 hours.  CBG: Recent Labs  Lab 06/18/18 0802 06/18/18 1209 06/18/18 1643 06/18/18 2103 06/19/18 0740  GLUCAP 171* 204* 232* 203* 154*       Signed:  Alma Friendly, MD [facility] 06/19/2018, 11:09 AM

## 2018-06-19 NOTE — Progress Notes (Signed)
Physical Therapy Treatment Patient Details Name: Mary Raymond MRN: [id number] DOB: 11/30/1950 Today's Date: 06/19/2018    History of Present Illness 67 y.o. female admitted to [facility] 9/19 with NSTEMI with heart cath on 9/20, and pt with right visual loss (9/22) and MRI showing small subcentimeter left occipital infarcts. PMHx:  HTN, HLD, type II DM, obesity, gout    PT Comments    Patient making good progress towards goals. Patient motivated to progress mobility in hallway, but is limited due to fatigue. Electing to use RW today for support with mobility with good overall stability and not requiring physical assist for steadying. PT to continue to follow acutely.     Follow Up Recommendations  SNF;Supervision for mobility/OOB     Equipment Recommendations  Rolling walker with 5" wheels    Recommendations for Other Services       Precautions / Restrictions Precautions Precautions: Fall Restrictions Weight Bearing Restrictions: No    Mobility  Bed Mobility Overal bed mobility: Modified Independent             General bed mobility comments: up in recliner  Transfers Overall transfer level: Needs assistance Equipment used: Rolling walker (2 wheeled) Transfers: Sit to/from Stand Sit to Stand: Min guard         General transfer comment: min guard from low surface for safety and balance; increased effort  Ambulation/Gait Ambulation/Gait assistance: Min guard Gait Distance (Feet): 40 Feet(x2) Assistive device: Rolling walker (2 wheeled) Gait Pattern/deviations: Step-through pattern;Decreased stride length;Trunk flexed Gait velocity: decreased    General Gait Details: patient electing to use RW. Patient with good stability only requiring Min guard for safety; quickly fatigues.   Stairs             Wheelchair Mobility    Modified Rankin (Stroke Patients Only)       Balance Overall balance assessment: Needs assistance Sitting-balance  support: No upper extremity supported;Feet supported Sitting balance-Leahy Scale: Good Sitting balance - Comments: Able to maintain static sitting   Standing balance support: Bilateral upper extremity supported;During functional activity Standing balance-Leahy Scale: Fair Standing balance comment: reliant on RW for gait                             Cognition Arousal/Alertness: Awake/alert Behavior During Therapy: WFL for tasks assessed/performed Overall Cognitive Status: Within Functional Limits for tasks assessed                                        Exercises      General Comments General comments (skin integrity, edema, etc.): Pt has the ability to complete most adl tasks but does not have the activity tolerance to be functional at home with adls.      Pertinent Vitals/Pain Pain Assessment: No/denies pain    Home Living                      Prior Function            PT Goals (current goals can now be found in the care plan section) Acute Rehab PT Goals Patient Stated Goal: I am ready to go to rehab PT Goal Formulation: With patient Time For Goal Achievement: 06/23/18 Potential to Achieve Goals: Fair Progress towards PT goals: Progressing toward goals    Frequency    Min 3X/week  PT Plan Current plan remains appropriate    Co-evaluation              AM-PAC PT "6 Clicks" Daily Activity  Outcome Measure  Difficulty turning over in bed (including adjusting bedclothes, sheets and blankets)?: A Lot Difficulty moving from lying on back to sitting on the side of the bed? : A Lot Difficulty sitting down on and standing up from a chair with arms (e.g., wheelchair, bedside commode, etc,.)?: A Little Help needed moving to and from a bed to chair (including a wheelchair)?: A Little Help needed walking in hospital room?: A Little Help needed climbing 3-5 steps with a railing? : A Lot 6 Click Score: 15    End of Session  Equipment Utilized During Treatment: Gait belt Activity Tolerance: Patient limited by fatigue Patient left: in chair;with call bell/phone within reach Nurse Communication: Mobility status PT Visit Diagnosis: Unsteadiness on feet (R26.81);Other abnormalities of gait and mobility (R26.89);Muscle weakness (generalized) (M62.81);Other symptoms and signs involving the nervous system (R29.898)     Time: 1007-1020 PT Time Calculation (min) (ACUTE ONLY): 13 min  Charges:  $Gait Training: 8-22 mins                     Lanney Gins, PT, DPT Supplemental Physical Therapist 06/19/18 10:47 AM Pager: [phone] Office: [phone]

## 2018-06-19 NOTE — Care Management Note (Signed)
Case Management Note  Patient Details  Name: Mary Raymond MRN: [id number] Date of Birth: 1950-10-14  Subjective/Objective: Pt presented for Chest Pain. Plan will be to transition to [facility]. CSW following for disposition needs.                    Action/Plan: No further needs from CM at this time.   Expected Discharge Date:  06/19/18               Expected Discharge Plan:  Skilled Nursing Facility  In-House Referral:  Clinical Social Work  Discharge planning Services  CM Consult  Post Acute Care Choice:  NA Choice offered to:  NA  DME Arranged:  N/A DME Agency:  NA  HH Arranged:  NA HH Agency:  NA  Status of Service:  Completed, signed off  If discussed at [employer] of Stay Meetings, dates discussed:    Additional Comments:  Bethena Roys, RN 06/19/2018, 11:17 AM

## 2018-06-19 NOTE — Care Management Important Message (Signed)
Important Message  Patient Details  Name: Mary Raymond MRN: [id number] Date of Birth: [birth year]   Medicare Important Message Given:  Yes    Terree Gaultney P [place] 06/19/2018, 1:30 PM

## 2018-06-19 NOTE — Progress Notes (Signed)
Patient will discharge to [facility]. Anticipated discharge date: 06/19/18 Family notified: Maleeka Sabatino, daughter Transportation by: Corey Harold  Nurse to call report to [phone]. Patient will go to room 301 at the facility. Nurse to request to speak to nurse on 300 hall when calling report.   CSW signing off.  [name], [place]  Clinical Social Worker

## 2018-06-22 DIAGNOSIS — R609 Edema, unspecified: Secondary | ICD-10-CM | POA: Diagnosis not present

## 2018-06-22 DIAGNOSIS — E785 Hyperlipidemia, unspecified: Secondary | ICD-10-CM | POA: Diagnosis not present

## 2018-06-22 DIAGNOSIS — E559 Vitamin D deficiency, unspecified: Secondary | ICD-10-CM | POA: Diagnosis not present

## 2018-06-22 DIAGNOSIS — I639 Cerebral infarction, unspecified: Secondary | ICD-10-CM | POA: Diagnosis not present

## 2018-06-23 ENCOUNTER — Other Ambulatory Visit: Payer: Medicare HMO

## 2018-06-23 ENCOUNTER — Ambulatory Visit: Payer: Medicare HMO | Admitting: Cardiology

## 2018-06-26 ENCOUNTER — Emergency Department (HOSPITAL_COMMUNITY): Payer: Medicare HMO

## 2018-06-26 ENCOUNTER — Other Ambulatory Visit: Payer: Self-pay

## 2018-06-26 ENCOUNTER — Ambulatory Visit: Payer: Medicare HMO | Admitting: Cardiology

## 2018-06-26 ENCOUNTER — Inpatient Hospital Stay (HOSPITAL_COMMUNITY)
Admission: EM | Admit: 2018-06-26 | Discharge: 2018-07-01 | DRG: 243 | Disposition: A | Discharge status: Skilled Nursing Facility | Payer: Medicare HMO | Attending: Family Medicine | Admitting: Family Medicine

## 2018-06-26 ENCOUNTER — Encounter (HOSPITAL_COMMUNITY): Payer: Self-pay | Admitting: *Deleted

## 2018-06-26 DIAGNOSIS — Z959 Presence of cardiac and vascular implant and graft, unspecified: Secondary | ICD-10-CM

## 2018-06-26 DIAGNOSIS — I442 Atrioventricular block, complete: Secondary | ICD-10-CM | POA: Diagnosis not present

## 2018-06-26 DIAGNOSIS — K219 Gastro-esophageal reflux disease without esophagitis: Secondary | ICD-10-CM | POA: Diagnosis present

## 2018-06-26 DIAGNOSIS — R5381 Other malaise: Secondary | ICD-10-CM | POA: Diagnosis present

## 2018-06-26 DIAGNOSIS — R079 Chest pain, unspecified: Secondary | ICD-10-CM | POA: Diagnosis present

## 2018-06-26 DIAGNOSIS — Z90710 Acquired absence of both cervix and uterus: Secondary | ICD-10-CM

## 2018-06-26 DIAGNOSIS — M797 Fibromyalgia: Secondary | ICD-10-CM | POA: Diagnosis not present

## 2018-06-26 DIAGNOSIS — E039 Hypothyroidism, unspecified: Secondary | ICD-10-CM | POA: Diagnosis present

## 2018-06-26 DIAGNOSIS — E1165 Type 2 diabetes mellitus with hyperglycemia: Secondary | ICD-10-CM | POA: Diagnosis not present

## 2018-06-26 DIAGNOSIS — I25119 Atherosclerotic heart disease of native coronary artery with unspecified angina pectoris: Secondary | ICD-10-CM | POA: Diagnosis not present

## 2018-06-26 DIAGNOSIS — M7989 Other specified soft tissue disorders: Secondary | ICD-10-CM | POA: Diagnosis not present

## 2018-06-26 DIAGNOSIS — J45909 Unspecified asthma, uncomplicated: Secondary | ICD-10-CM | POA: Diagnosis present

## 2018-06-26 DIAGNOSIS — E1142 Type 2 diabetes mellitus with diabetic polyneuropathy: Secondary | ICD-10-CM | POA: Diagnosis present

## 2018-06-26 DIAGNOSIS — I959 Hypotension, unspecified: Secondary | ICD-10-CM | POA: Diagnosis not present

## 2018-06-26 DIAGNOSIS — I208 Other forms of angina pectoris: Secondary | ICD-10-CM | POA: Diagnosis not present

## 2018-06-26 DIAGNOSIS — R001 Bradycardia, unspecified: Secondary | ICD-10-CM

## 2018-06-26 DIAGNOSIS — E860 Dehydration: Secondary | ICD-10-CM | POA: Diagnosis not present

## 2018-06-26 DIAGNOSIS — Z825 Family history of asthma and other chronic lower respiratory diseases: Secondary | ICD-10-CM

## 2018-06-26 DIAGNOSIS — Z950 Presence of cardiac pacemaker: Secondary | ICD-10-CM | POA: Diagnosis not present

## 2018-06-26 DIAGNOSIS — I200 Unstable angina: Secondary | ICD-10-CM

## 2018-06-26 DIAGNOSIS — J45998 Other asthma: Secondary | ICD-10-CM | POA: Diagnosis not present

## 2018-06-26 DIAGNOSIS — R0602 Shortness of breath: Secondary | ICD-10-CM | POA: Diagnosis not present

## 2018-06-26 DIAGNOSIS — Z888 Allergy status to other drugs, medicaments and biological substances status: Secondary | ICD-10-CM

## 2018-06-26 DIAGNOSIS — E785 Hyperlipidemia, unspecified: Secondary | ICD-10-CM | POA: Diagnosis present

## 2018-06-26 DIAGNOSIS — R600 Localized edema: Secondary | ICD-10-CM | POA: Diagnosis not present

## 2018-06-26 DIAGNOSIS — R0902 Hypoxemia: Secondary | ICD-10-CM | POA: Diagnosis not present

## 2018-06-26 DIAGNOSIS — I69398 Other sequelae of cerebral infarction: Secondary | ICD-10-CM | POA: Diagnosis not present

## 2018-06-26 DIAGNOSIS — E1169 Type 2 diabetes mellitus with other specified complication: Secondary | ICD-10-CM | POA: Diagnosis not present

## 2018-06-26 DIAGNOSIS — H538 Other visual disturbances: Secondary | ICD-10-CM | POA: Diagnosis not present

## 2018-06-26 DIAGNOSIS — R011 Cardiac murmur, unspecified: Secondary | ICD-10-CM | POA: Diagnosis present

## 2018-06-26 DIAGNOSIS — I152 Hypertension secondary to endocrine disorders: Secondary | ICD-10-CM | POA: Diagnosis present

## 2018-06-26 DIAGNOSIS — Z79890 Hormone replacement therapy (postmenopausal): Secondary | ICD-10-CM

## 2018-06-26 DIAGNOSIS — Z48812 Encounter for surgical aftercare following surgery on the circulatory system: Secondary | ICD-10-CM | POA: Diagnosis not present

## 2018-06-26 DIAGNOSIS — I213 ST elevation (STEMI) myocardial infarction of unspecified site: Secondary | ICD-10-CM | POA: Diagnosis present

## 2018-06-26 DIAGNOSIS — I672 Cerebral atherosclerosis: Secondary | ICD-10-CM | POA: Diagnosis not present

## 2018-06-26 DIAGNOSIS — N183 Chronic kidney disease, stage 3 (moderate): Secondary | ICD-10-CM | POA: Diagnosis not present

## 2018-06-26 DIAGNOSIS — Z955 Presence of coronary angioplasty implant and graft: Secondary | ICD-10-CM

## 2018-06-26 DIAGNOSIS — I259 Chronic ischemic heart disease, unspecified: Secondary | ICD-10-CM | POA: Diagnosis not present

## 2018-06-26 DIAGNOSIS — Z7902 Long term (current) use of antithrombotics/antiplatelets: Secondary | ICD-10-CM

## 2018-06-26 DIAGNOSIS — Z6841 Body Mass Index (BMI) 40.0 and over, adult: Secondary | ICD-10-CM | POA: Diagnosis not present

## 2018-06-26 DIAGNOSIS — R0789 Other chest pain: Secondary | ICD-10-CM | POA: Diagnosis not present

## 2018-06-26 DIAGNOSIS — I10 Essential (primary) hypertension: Secondary | ICD-10-CM | POA: Diagnosis not present

## 2018-06-26 DIAGNOSIS — D631 Anemia in chronic kidney disease: Secondary | ICD-10-CM | POA: Diagnosis not present

## 2018-06-26 DIAGNOSIS — I25110 Atherosclerotic heart disease of native coronary artery with unstable angina pectoris: Secondary | ICD-10-CM | POA: Diagnosis present

## 2018-06-26 DIAGNOSIS — Z79899 Other long term (current) drug therapy: Secondary | ICD-10-CM

## 2018-06-26 DIAGNOSIS — I6340 Cerebral infarction due to embolism of unspecified cerebral artery: Secondary | ICD-10-CM | POA: Diagnosis present

## 2018-06-26 DIAGNOSIS — Z7951 Long term (current) use of inhaled steroids: Secondary | ICD-10-CM

## 2018-06-26 DIAGNOSIS — E1159 Type 2 diabetes mellitus with other circulatory complications: Secondary | ICD-10-CM

## 2018-06-26 DIAGNOSIS — Z8249 Family history of ischemic heart disease and other diseases of the circulatory system: Secondary | ICD-10-CM

## 2018-06-26 DIAGNOSIS — I82622 Acute embolism and thrombosis of deep veins of left upper extremity: Secondary | ICD-10-CM | POA: Diagnosis not present

## 2018-06-26 DIAGNOSIS — M2550 Pain in unspecified joint: Secondary | ICD-10-CM | POA: Diagnosis not present

## 2018-06-26 DIAGNOSIS — I129 Hypertensive chronic kidney disease with stage 1 through stage 4 chronic kidney disease, or unspecified chronic kidney disease: Secondary | ICD-10-CM | POA: Diagnosis present

## 2018-06-26 DIAGNOSIS — Z794 Long term (current) use of insulin: Secondary | ICD-10-CM

## 2018-06-26 DIAGNOSIS — I441 Atrioventricular block, second degree: Secondary | ICD-10-CM | POA: Diagnosis not present

## 2018-06-26 DIAGNOSIS — Z7982 Long term (current) use of aspirin: Secondary | ICD-10-CM

## 2018-06-26 DIAGNOSIS — E1122 Type 2 diabetes mellitus with diabetic chronic kidney disease: Secondary | ICD-10-CM | POA: Diagnosis present

## 2018-06-26 DIAGNOSIS — I252 Old myocardial infarction: Secondary | ICD-10-CM

## 2018-06-26 DIAGNOSIS — I82409 Acute embolism and thrombosis of unspecified deep veins of unspecified lower extremity: Secondary | ICD-10-CM

## 2018-06-26 DIAGNOSIS — Z833 Family history of diabetes mellitus: Secondary | ICD-10-CM

## 2018-06-26 DIAGNOSIS — I4891 Unspecified atrial fibrillation: Secondary | ICD-10-CM | POA: Diagnosis not present

## 2018-06-26 DIAGNOSIS — Z7401 Bed confinement status: Secondary | ICD-10-CM | POA: Diagnosis not present

## 2018-06-26 DIAGNOSIS — F419 Anxiety disorder, unspecified: Secondary | ICD-10-CM | POA: Diagnosis present

## 2018-06-26 DIAGNOSIS — I639 Cerebral infarction, unspecified: Secondary | ICD-10-CM | POA: Diagnosis not present

## 2018-06-26 HISTORY — DX: Type 2 diabetes mellitus with diabetic polyneuropathy: E11.42

## 2018-06-26 HISTORY — DX: Unspecified osteoarthritis, unspecified site: M19.90

## 2018-06-26 HISTORY — DX: Atherosclerotic heart disease of native coronary artery without angina pectoris: I25.10

## 2018-06-26 HISTORY — DX: Migraine, unspecified, not intractable, without status migrainosus: G43.909

## 2018-06-26 HISTORY — DX: Gastro-esophageal reflux disease without esophagitis: K21.9

## 2018-06-26 HISTORY — DX: Fibromyalgia: M79.7

## 2018-06-26 HISTORY — DX: Cerebral infarction, unspecified: I63.9

## 2018-06-26 HISTORY — DX: Type 2 diabetes mellitus without complications: E11.9

## 2018-06-26 HISTORY — DX: Low back pain, unspecified: M54.50

## 2018-06-26 HISTORY — DX: Other seasonal allergic rhinitis: J30.2

## 2018-06-26 HISTORY — DX: Pneumonia, unspecified organism: J18.9

## 2018-06-26 HISTORY — DX: Other chronic pain: G89.29

## 2018-06-26 HISTORY — DX: Low back pain: M54.5

## 2018-06-26 LAB — CBC WITH DIFFERENTIAL/PLATELET
Abs Immature Granulocytes: 0.11 10*3/uL — ABNORMAL HIGH (ref 0.00–0.07)
Basophils Absolute: 0 10*3/uL (ref 0.0–0.1)
Basophils Relative: 0 %
Eosinophils Absolute: 0.2 10*3/uL (ref 0.0–0.5)
Eosinophils Relative: 2 %
HEMATOCRIT: 31.3 % — AB (ref 36.0–46.0)
HEMOGLOBIN: 9.1 g/dL — AB (ref 12.0–15.0)
Immature Granulocytes: 1 %
LYMPHS ABS: 3.2 10*3/uL (ref 0.7–4.0)
Lymphocytes Relative: 32 %
MCH: 24.1 pg — AB (ref 26.0–34.0)
MCHC: 29.1 g/dL — AB (ref 30.0–36.0)
MCV: 82.8 fL (ref 80.0–100.0)
MONOS PCT: 8 %
Monocytes Absolute: 0.8 10*3/uL (ref 0.1–1.0)
NEUTROS ABS: 5.7 10*3/uL (ref 1.7–7.7)
NEUTROS PCT: 57 %
NRBC: 0 % (ref 0.0–0.2)
Platelets: 289 10*3/uL (ref 150–400)
RBC: 3.78 MIL/uL — AB (ref 3.87–5.11)
RDW: 15.7 % — ABNORMAL HIGH (ref 11.5–15.5)
WBC: 10 10*3/uL (ref 4.0–10.5)

## 2018-06-26 LAB — BASIC METABOLIC PANEL
ANION GAP: 9 (ref 5–15)
BUN: 28 mg/dL — ABNORMAL HIGH (ref 8–23)
CALCIUM: 9.1 mg/dL (ref 8.9–10.3)
CO2: 27 mmol/L (ref 22–32)
Chloride: 104 mmol/L (ref 98–111)
Creatinine, Ser: 1.18 mg/dL — ABNORMAL HIGH (ref 0.44–1.00)
GFR, EST AFRICAN AMERICAN: 54 mL/min — AB (ref >60)
GFR, EST NON AFRICAN AMERICAN: 47 mL/min — AB (ref >60)
GLUCOSE: 236 mg/dL — AB (ref 70–99)
POTASSIUM: 4.6 mmol/L (ref 3.5–5.1)
Sodium: 140 mmol/L (ref 135–145)

## 2018-06-26 LAB — PLATELET INHIBITION P2Y12: PLATELET FUNCTION P2Y12: 324 [PRU] (ref 194–418)

## 2018-06-26 LAB — TROPONIN I: Troponin I: <0.03 ng/mL (ref <0.03)

## 2018-06-26 LAB — GLUCOSE, CAPILLARY
GLUCOSE-CAPILLARY: 279 mg/dL — AB (ref 70–99)
GLUCOSE-CAPILLARY: 334 mg/dL — AB (ref 70–99)

## 2018-06-26 MED ORDER — ACETAMINOPHEN 650 MG RE SUPP: 650.0000 mg | Freq: Four times a day (QID) | RECTAL | Status: DC | PRN

## 2018-06-26 MED ORDER — SODIUM CHLORIDE 0.9% FLUSH
3.0000 mL | Freq: Two times a day (BID) | INTRAVENOUS | Status: DC
  Administered 2018-06-26 – 2018-06-27 (×3): 3 mL via INTRAVENOUS

## 2018-06-26 MED ORDER — NITROGLYCERIN 0.4 MG SL SUBL
0.4000 mg | SUBLINGUAL_TABLET | SUBLINGUAL | Status: DC | PRN
  Administered 2018-06-26: 0.4 mg via SUBLINGUAL
  Filled 2018-06-26: qty 1

## 2018-06-26 MED ORDER — ACYCLOVIR 400 MG PO TABS
400.0000 mg | ORAL_TABLET | Freq: Every day | ORAL | Status: DC
  Administered 2018-06-27 – 2018-07-01 (×5): 400 mg via ORAL
  Filled 2018-06-26 (×6): qty 1

## 2018-06-26 MED ORDER — AMLODIPINE BESYLATE 10 MG PO TABS
10.0000 mg | ORAL_TABLET | Freq: Every day | ORAL | Status: DC
  Administered 2018-06-26: 5 mg via ORAL
  Administered 2018-06-27 – 2018-07-01 (×5): 10 mg via ORAL
  Filled 2018-06-26: qty 2
  Filled 2018-06-26 (×5): qty 1

## 2018-06-26 MED ORDER — FUROSEMIDE 20 MG PO TABS
20.0000 mg | ORAL_TABLET | Freq: Every day | ORAL | Status: DC
  Administered 2018-06-26 – 2018-07-01 (×6): 20 mg via ORAL
  Filled 2018-06-26 (×6): qty 1

## 2018-06-26 MED ORDER — INSULIN ASPART 100 UNIT/ML ~~LOC~~ SOLN
0.0000 [IU] | Freq: Every day | SUBCUTANEOUS | Status: DC
  Administered 2018-06-26: 4 [IU] via SUBCUTANEOUS
  Administered 2018-06-28: 3 [IU] via SUBCUTANEOUS
  Administered 2018-06-29: 2 [IU] via SUBCUTANEOUS
  Administered 2018-06-30: 3 [IU] via SUBCUTANEOUS

## 2018-06-26 MED ORDER — ONDANSETRON HCL 4 MG/2ML IJ SOLN: 4.0000 mg | Freq: Four times a day (QID) | INTRAMUSCULAR | Status: DC | PRN

## 2018-06-26 MED ORDER — ROSUVASTATIN CALCIUM 20 MG PO TABS
40.0000 mg | ORAL_TABLET | Freq: Every day | ORAL | Status: DC
  Filled 2018-06-26: qty 1

## 2018-06-26 MED ORDER — ASPIRIN EC 81 MG PO TBEC
81.0000 mg | DELAYED_RELEASE_TABLET | Freq: Every day | ORAL | Status: DC
  Administered 2018-06-27 – 2018-07-01 (×5): 81 mg via ORAL
  Filled 2018-06-26 (×5): qty 1

## 2018-06-26 MED ORDER — IPRATROPIUM-ALBUTEROL 0.5-2.5 (3) MG/3ML IN SOLN
3.0000 mL | Freq: Four times a day (QID) | RESPIRATORY_TRACT | Status: DC | PRN
  Administered 2018-06-26: 3 mL via RESPIRATORY_TRACT
  Filled 2018-06-26: qty 3

## 2018-06-26 MED ORDER — ALPRAZOLAM 0.5 MG PO TABS
0.5000 mg | ORAL_TABLET | Freq: Two times a day (BID) | ORAL | Status: DC | PRN
  Administered 2018-06-26 – 2018-06-30 (×5): 0.5 mg via ORAL
  Filled 2018-06-26 (×5): qty 1

## 2018-06-26 MED ORDER — HYDROCODONE-ACETAMINOPHEN 7.5-325 MG PO TABS
1.0000 | ORAL_TABLET | Freq: Four times a day (QID) | ORAL | Status: DC | PRN
  Administered 2018-06-26: 1 via ORAL
  Filled 2018-06-26 (×2): qty 1

## 2018-06-26 MED ORDER — ASPIRIN 81 MG PO CHEW
324.0000 mg | CHEWABLE_TABLET | Freq: Once | ORAL | Status: DC
  Filled 2018-06-26: qty 4

## 2018-06-26 MED ORDER — ENOXAPARIN SODIUM 40 MG/0.4ML ~~LOC~~ SOLN
40.0000 mg | SUBCUTANEOUS | Status: DC
  Administered 2018-06-26 – 2018-06-27 (×2): 40 mg via SUBCUTANEOUS
  Filled 2018-06-26 (×2): qty 0.4

## 2018-06-26 MED ORDER — LEVOTHYROXINE SODIUM 50 MCG PO TABS
50.0000 ug | ORAL_TABLET | Freq: Every day | ORAL | Status: DC
  Administered 2018-06-26 – 2018-07-01 (×6): 50 ug via ORAL
  Filled 2018-06-26 (×6): qty 1

## 2018-06-26 MED ORDER — SODIUM CHLORIDE 0.9 % IV SOLN: 250.0000 mL | INTRAVENOUS | Status: DC | PRN

## 2018-06-26 MED ORDER — ONDANSETRON HCL 4 MG PO TABS: 4.0000 mg | ORAL_TABLET | Freq: Four times a day (QID) | ORAL | Status: DC | PRN

## 2018-06-26 MED ORDER — INSULIN ASPART 100 UNIT/ML ~~LOC~~ SOLN
0.0000 [IU] | Freq: Three times a day (TID) | SUBCUTANEOUS | Status: DC
  Administered 2018-06-26 – 2018-06-27 (×3): 11 [IU] via SUBCUTANEOUS
  Administered 2018-06-27 – 2018-06-28 (×2): 7 [IU] via SUBCUTANEOUS
  Administered 2018-06-28: 3 [IU] via SUBCUTANEOUS
  Administered 2018-06-28 – 2018-06-29 (×2): 4 [IU] via SUBCUTANEOUS
  Administered 2018-06-29: 11 [IU] via SUBCUTANEOUS
  Administered 2018-06-30 (×2): 4 [IU] via SUBCUTANEOUS
  Administered 2018-06-30: 7 [IU] via SUBCUTANEOUS
  Administered 2018-07-01: 3 [IU] via SUBCUTANEOUS
  Administered 2018-07-01: 7 [IU] via SUBCUTANEOUS

## 2018-06-26 MED ORDER — PANTOPRAZOLE SODIUM 40 MG PO TBEC
40.0000 mg | DELAYED_RELEASE_TABLET | Freq: Every day | ORAL | Status: DC
  Administered 2018-06-26 – 2018-07-01 (×6): 40 mg via ORAL
  Filled 2018-06-26 (×6): qty 1

## 2018-06-26 MED ORDER — ACETAMINOPHEN 325 MG PO TABS
650.0000 mg | ORAL_TABLET | Freq: Four times a day (QID) | ORAL | Status: DC | PRN
  Administered 2018-06-26: 650 mg via ORAL
  Filled 2018-06-26: qty 2

## 2018-06-26 MED ORDER — INSULIN GLARGINE 100 UNIT/ML ~~LOC~~ SOLN
25.0000 [IU] | Freq: Two times a day (BID) | SUBCUTANEOUS | Status: DC
  Administered 2018-06-26 – 2018-06-29 (×6): 25 [IU] via SUBCUTANEOUS
  Filled 2018-06-26 (×10): qty 0.25

## 2018-06-26 MED ORDER — ISOSORBIDE MONONITRATE ER 30 MG PO TB24
30.0000 mg | ORAL_TABLET | Freq: Every day | ORAL | Status: DC
  Administered 2018-06-26 – 2018-06-27 (×2): 30 mg via ORAL
  Filled 2018-06-26 (×4): qty 1

## 2018-06-26 MED ORDER — INFLUENZA VAC SPLIT HIGH-DOSE 0.5 ML IM SUSY
0.5000 mL | PREFILLED_SYRINGE | INTRAMUSCULAR | Status: DC
  Filled 2018-06-26: qty 0.5

## 2018-06-26 MED ORDER — HYDRALAZINE HCL 25 MG PO TABS
25.0000 mg | ORAL_TABLET | Freq: Two times a day (BID) | ORAL | Status: DC
  Administered 2018-06-26 – 2018-06-29 (×7): 25 mg via ORAL
  Filled 2018-06-26 (×7): qty 1

## 2018-06-26 MED ORDER — IPRATROPIUM-ALBUTEROL 0.5-2.5 (3) MG/3ML IN SOLN
3.0000 mL | Freq: Once | RESPIRATORY_TRACT | Status: AC
  Administered 2018-06-26: 3 mL via RESPIRATORY_TRACT
  Filled 2018-06-26: qty 3

## 2018-06-26 MED ORDER — SODIUM CHLORIDE 0.9% FLUSH: 3.0000 mL | INTRAVENOUS | Status: DC | PRN

## 2018-06-26 MED ORDER — CLOPIDOGREL BISULFATE 75 MG PO TABS
75.0000 mg | ORAL_TABLET | Freq: Every day | ORAL | Status: DC
  Administered 2018-06-26 – 2018-07-01 (×6): 75 mg via ORAL
  Filled 2018-06-26 (×6): qty 1

## 2018-06-26 MED ORDER — GABAPENTIN 100 MG PO CAPS
100.0000 mg | ORAL_CAPSULE | Freq: Three times a day (TID) | ORAL | Status: DC
  Administered 2018-06-26 – 2018-07-01 (×16): 100 mg via ORAL
  Filled 2018-06-26 (×14): qty 1

## 2018-06-26 MED ORDER — FLUTICASONE PROPIONATE 50 MCG/ACT NA SUSP
2.0000 | Freq: Every day | NASAL | Status: DC
  Administered 2018-07-01: 2 via NASAL
  Filled 2018-06-26 (×2): qty 16

## 2018-06-26 MED ORDER — LOSARTAN POTASSIUM 50 MG PO TABS
100.0000 mg | ORAL_TABLET | Freq: Every day | ORAL | Status: DC
  Administered 2018-06-26 – 2018-07-01 (×6): 100 mg via ORAL
  Filled 2018-06-26: qty 2
  Filled 2018-06-26: qty 4
  Filled 2018-06-26 (×4): qty 2

## 2018-06-26 NOTE — H&P (Addendum)
History and Physical    Mary Raymond SWH:[id number] DOB: 1951-03-19 DOA: 06/26/2018  [name], FNP   Patient coming from: Home  Chief Complaint: Chest pain  HPI: Mary Raymond is a 67 y.o. female with medical history significant for CAD with recent NSTEMI and LHC on 9/20 and PCI to RCA with triple-vessel CAD-not a candidate for CABG, patient is Mobitz 1 second-degree heart block, hypertension, hyperlipidemia, type 2 diabetes, morbid obesity, prior CVA, fibromyalgia, and hypothyroidism who presented to the ED from [facility] with sudden onset substernal chest pain that began approximately 10 to 15 hours ago.  The pain was moderate in severity and was pressure-like in sensation.  She denied any radiation of the pain and stated that it was similar to when she had her prior MI just under a month ago.  She was noted to also have some weakness and shortness of breath, but no fever, syncope, chills, vomiting, nausea, diaphoresis, or lightheadedness.  She appears to be receiving her aspirin and Plavix as prescribed at her current facility.   ED Course: Vital signs are stable with adequate blood pressure readings noted.  She does have a Mobitz 1 heart block noted that is chronic for her.  Heart rate remains in the 40 to 60 bpm range.  She had multiple intermittent episodes of chest pain in the ED.  She received some nitroglycerin in the ED and is currently chest pain-free and did have some shortness of breath for which she was given reading treatment with improvement.  Troponin is less than 0.03 and EKG does not demonstrate any findings of ACS.  Laboratory data otherwise with BUN 28 and creatinine 1.18 and glucose 236.  Review of Systems: All others reviewed and otherwise negative.  Past Medical History:  Diagnosis Date  . Allergy   . Anxiety   . Asthma   . Diabetes mellitus without complication ([place])   . Gout   . Hyperlipidemia   . Hypertension   . Hypothyroid   .  Neuropathy   . Obesity   . Vitamin D deficiency     Past Surgical History:  Procedure Laterality Date  . ABDOMINAL HYSTERECTOMY    . BREAST LUMPECTOMY    . CORONARY ATHERECTOMY N/A 06/11/2018   Procedure: CORONARY ATHERECTOMY;  Surgeon: Martinique, Peter M, MD;  Location: [place] [facility];  Service: Cardiovascular;  Laterality: N/A;  . CORONARY STENT INTERVENTION N/A 06/11/2018   Procedure: CORONARY STENT INTERVENTION;  Surgeon: Martinique, Peter M, MD;  Location: [place] [facility];  Service: Cardiovascular;  Laterality: N/A;  . LEFT HEART CATH AND CORONARY ANGIOGRAPHY N/A 06/06/2018   Procedure: LEFT HEART CATH AND CORONARY ANGIOGRAPHY;  Surgeon: Troy Sine, MD;  Location: [place] [facility];  Service: Cardiovascular;  Laterality: N/A;  . rectal fissure       reports that she has never smoked. She has never used smokeless tobacco. She reports that she does not drink alcohol or use drugs.  Allergies  Allergen Reactions  . Atorvastatin Other (See Comments)    Leg weakness.    Family History  Problem Relation Age of Onset  . Cancer Mother        colon  . Diabetes Mother   . Hypertension Mother   . Dementia Mother   . Heart disease Father   . Asthma Sister   . Cancer Sister        breast  . Asthma Sister   . Scoliosis Sister   .  Diabetes Sister   . Asthma Sister     Prior to Admission medications   Medication Sig Start Date End Date Taking? Authorizing Provider  ACCU-CHEK SOFTCLIX LANCETS lancets Test BG TID 12/02/17   Evelina Dun A, FNP  acyclovir (ZOVIRAX) 400 MG tablet TAKE 1 TABLET EVERY DAY 05/21/18   Hawks, Alyse Low A, FNP  albuterol (PROVENTIL HFA;VENTOLIN HFA) 108 (90 Base) MCG/ACT inhaler Inhale 1-2 puffs into the lungs every 6 (six) hours as needed for wheezing or shortness of breath.    [provider]  ALPRAZolam Duanne Moron) 0.5 MG tablet TAKE 1 TABLET 2 TIMES A DAY AS NEEDED FOR ANXIETY 06/16/18   Ghimire, Henreitta Leber, MD  amLODipine (NORVASC) 10 MG tablet  TAKE 1 TABLET EVERY DAY 07/09/17   Evelina Dun A, FNP  aspirin EC 81 MG EC tablet Take 1 tablet (81 mg total) by mouth daily. 06/17/18   Ghimire, Henreitta Leber, MD  B-D UF III MINI PEN NEEDLES 31G X 5 MM MISC USE AS INSTRUCTED TO GIVE LANTUS INJECTION TWICE DAILY 03/26/18   Evelina Dun A, FNP  clopidogrel (PLAVIX) 75 MG tablet Take 1 tablet (75 mg total) by mouth daily. 06/17/18   Ghimire, Henreitta Leber, MD  fluticasone (FLONASE) 50 MCG/ACT nasal spray USE 2 SPRAYS IN EACH NOSTRIL EVERY DAY 03/25/18   Evelina Dun A, FNP  furosemide (LASIX) 20 MG tablet TAKE 1 TABLET (20 MG TOTAL) BY MOUTH DAILY. 01/07/18   [name], FNP  gabapentin (NEURONTIN) 100 MG capsule Take 1 capsule (100 mg total) by mouth 3 (three) times daily. 06/19/18   Alma Friendly, MD  hydrALAZINE (APRESOLINE) 25 MG tablet Take 1 tablet (25 mg total) by mouth 2 (two) times daily. 06/16/18   Ghimire, Henreitta Leber, MD  HYDROcodone-acetaminophen (NORCO) 7.5-325 MG tablet Take 1 tablet by mouth every 6 (six) hours as needed for moderate pain. 06/17/18   Pokhrel, Laxman, MD  insulin aspart (NOVOLOG) 100 UNIT/ML injection CBG < 70: implement hypoglycemia protocol CBG 70 - 120: 0 units CBG 121 - 150: 0 units CBG 151 - 200: 0 units CBG 201 - 250: 2 units CBG 251 - 300: 3 units CBG 301 - 350: 4 units CBG 351 - 400: 5 units CBG > 400: call MD 06/16/18   Jonetta Osgood, MD  Insulin Glargine (LANTUS SOLOSTAR) 100 UNIT/ML Solostar Pen Inject 25 Units into the skin 2 (two) times daily. 06/16/18   Ghimire, Henreitta Leber, MD  Ipratropium-Albuterol (COMBIVENT RESPIMAT) 20-100 MCG/ACT AERS respimat Inhale 1 puff into the lungs every 6 (six) hours as needed for wheezing. 12/06/17   Evelina Dun A, FNP  isosorbide mononitrate (IMDUR) 30 MG 24 hr tablet Take 1 tablet (30 mg total) by mouth daily. 06/17/18   Ghimire, Henreitta Leber, MD  levothyroxine (SYNTHROID, LEVOTHROID) 50 MCG tablet TAKE 1 TABLET EVERY DAY BEFORE BREAKFAST 05/09/17   Claretta Fraise, MD    losartan (COZAAR) 100 MG tablet TAKE 1 TABLET EVERY DAY 07/09/17   Evelina Dun A, FNP  montelukast (SINGULAIR) 10 MG tablet TAKE 1 TABLET (10 MG TOTAL) BY MOUTH AT BEDTIME. 03/25/18   Evelina Dun A, FNP  omeprazole (PRILOSEC) 40 MG capsule TAKE 1 CAPSULE EVERY DAY 02/19/17   Evelina Dun A, FNP  rosuvastatin (CRESTOR) 20 MG tablet Take 2 tablets (40 mg total) by mouth at bedtime. 06/16/18 06/16/19  Jonetta Osgood, MD    Physical Exam: Vitals:   06/26/18 0530 06/26/18 0541 06/26/18 0600 06/26/18 0630  BP: 131/79  Marland Kitchen)  122/33 92/61  Pulse: (!) 51  (!) 39 (!) 33  Resp: 16  13 20   Temp:      TempSrc:      SpO2: 98% 99% 98% 98%  Weight:      Height:        Constitutional: NAD, calm, comfortable, obese Vitals:   06/26/18 0530 06/26/18 0541 06/26/18 0600 06/26/18 0630  BP: 131/79  (!) 122/33 92/61  Pulse: (!) 51  (!) 39 (!) 33  Resp: 16  13 20   Temp:      TempSrc:      SpO2: 98% 99% 98% 98%  Weight:      Height:       Eyes: lids and conjunctivae normal ENMT: Mucous membranes are moist.  Neck: normal, supple Respiratory: clear to auscultation bilaterally. Normal respiratory effort. No accessory muscle use Stable, currently on 2 L nasal cannula Cardiovascular: Regular rate and rhythm, no murmurs. No extremity edema. Abdomen: no tenderness, no distention. Bowel sounds positive.  Musculoskeletal:  No joint deformity upper and lower extremities.   Skin: no rashes, lesions, ulcers.  Psychiatric: Normal judgment and insight. Alert and oriented x 3. Normal mood.   Labs on Admission: I have personally reviewed following labs and imaging studies  CBC: Recent Labs  Lab 06/26/18 0439  WBC 10.0  NEUTROABS 5.7  HGB 9.1*  HCT 31.3*  MCV 82.8  PLT 354   Basic Metabolic Panel: Recent Labs  Lab 06/26/18 0439  NA 140  K 4.6  CL 104  CO2 27  GLUCOSE 236*  BUN 28*  CREATININE 1.18*  CALCIUM 9.1   GFR: Estimated Creatinine Clearance: 61.2 mL/min (A) (by C-G formula based  on SCr of 1.18 mg/dL (H)). Liver Function Tests: No results for input(s): AST, ALT, ALKPHOS, BILITOT, PROT, ALBUMIN in the last 168 hours. No results for input(s): LIPASE, AMYLASE in the last 168 hours. No results for input(s): AMMONIA in the last 168 hours. Coagulation Profile: No results for input(s): INR, PROTIME in the last 168 hours. Cardiac Enzymes: Recent Labs  Lab 06/26/18 0439  TROPONINI <0.03   BNP (last 3 results) No results for input(s): PROBNP in the last 8760 hours. HbA1C: No results for input(s): HGBA1C in the last 72 hours. CBG: Recent Labs  Lab 06/19/18 1117  GLUCAP 273*   Lipid Profile: No results for input(s): CHOL, HDL, LDLCALC, TRIG, CHOLHDL, LDLDIRECT in the last 72 hours. Thyroid Function Tests: No results for input(s): TSH, T4TOTAL, FREET4, T3FREE, THYROIDAB in the last 72 hours. Anemia Panel: No results for input(s): VITAMINB12, FOLATE, FERRITIN, TIBC, IRON, RETICCTPCT in the last 72 hours. Urine analysis:    Component Value Date/Time   COLORURINE YELLOW 09/12/2017 1047   APPEARANCEUR Clear 02/25/2018 1050   LABSPEC 1.017 09/12/2017 1047   PHURINE 5.0 09/12/2017 1047   GLUCOSEU Negative 02/25/2018 1050   HGBUR SMALL (A) 09/12/2017 1047   BILIRUBINUR Negative 02/25/2018 [place] 09/12/2017 1047   PROTEINUR 3+ (A) 02/25/2018 1050   PROTEINUR >=300 (A) 09/12/2017 1047   UROBILINOGEN negative 11/14/2015 0949   NITRITE Negative 02/25/2018 1050   NITRITE NEGATIVE 09/12/2017 1047   LEUKOCYTESUR Negative 02/25/2018 1050    Radiological Exams on Admission: Dg Chest Port 1 View  Result Date: 06/26/2018 CLINICAL DATA:  Chest pain and shortness of breath. Similar pain 2 weeks ago with STEMI. History of asthma, diabetes, hypertension, breast lumpectomy EXAM: PORTABLE CHEST 1 VIEW COMPARISON:  06/05/2018 FINDINGS: Cardiac enlargement. No airspace disease or consolidation in  the lungs. No blunting of costophrenic angles. No pneumothorax.  Mediastinal contours appear intact. Calcification of the aorta. IMPRESSION: No active disease. Electronically Signed   By: Lucienne Capers M.D.   On: 06/26/2018 04:02    EKG: Independently reviewed. Sinus at 48bpm with Mobitz I AV block; seen previously.  Assessment/Plan Principal Problem:   Chest pain Active Problems:   Hypothyroidism   Hypertension associated with diabetes ([place])   Hyperlipidemia associated with type 2 diabetes mellitus (H[facility])   Obesity, morbid, BMI 40.0-49.9 ([place])   NSTEMI (non-ST elevated myocardial infarction) ([place])   Cerebral embolism with cerebral infarction    1. Chest pain suspicious for unstable angina.  Continue to trend troponins and monitor on telemetry with repeat EKG in a.m.  Nitroglycerin as needed for chest pain.  Avoid beta-blocker given current heart rate in Mobitz one-point finding on telemetry.  [place] cardiology consultation for further evaluation.  Continue on aspirin and Plavix as prior. 2. History of CAD with recent PCI to RCA.  Continue aspirin, Plavix, and Crestor as noted above. 3. Hypothyroidism.  Continue Synthroid. 4. Hypertension.  Continue on amlodipine, Lasix, hydralazine, and losartan. 5. Prior history of CVA.  Continue on aspirin, Plavix, and Crestor. 6. CKD stage III.  Appears to be stable at the moment.  Continue to monitor on repeat lab work. 7. Obesity.  Continue to monitor in the outpatient setting.  Addendum: Discussed case with Dr. Domenic Polite who suggested transfer to [facility] for EP evaluation as patient is having what appears to be a higher grade AV block at times up to third-degree, which may be the cause of her symptomatology.  Maintain under hospitalist service as patient does not actively have ACS and monitor on telemetry.  DVT prophylaxis: Lovenox Code Status: Full Family Communication: None at bedside Disposition Plan:Chest pain evaluation for ACS with Cardiology Consults called:Cardiology Admission status:  Observation, Tele   Kainoah Bartosiewicz Darleen Crocker DO [facility] Pager [phone]  If 7PM-7AM, please contact night-coverage [email or web address] Password [email or web address]  06/26/2018, 8:22 AM

## 2018-06-26 NOTE — ED Notes (Signed)
Patient ambulatory to restroom.  Patient stated she has been laying until her back is hurting and requested to sit on side of bed. Patient was connected to monitor - advised not to get up until she ran call bell - left sitting on side of bed.

## 2018-06-26 NOTE — Consult Note (Addendum)
Cardiology Consultation:   Patient ID: Mary Raymond; [id number]; 1951-07-29   Admit date: 06/26/2018 Date of Consult: 06/26/2018  Primary Care Provider: [name], FNP Primary Cardiologist: Carlyle Dolly, MD Primary Electrophysiologist:  n/a   Patient Profile:   Mary Raymond is a 67 y.o. female with a hx of HTN, HLD, DM, Mobitz I heart block, asthma, gout, hypothyroid, morbid obesity, neuropathy, who is being seen today for the evaluation of chest pain at the request of Dr Manuella Ghazi.  History of Present Illness:   [name] was hospitalized for a NSTEMI 09/19-09/30, s/p DES x 2 RCA, med rx for residual dz, including 90% pCFX (not CABG candidate), d/c to [facility] for rehab.   Since d/c, she has been working with rehab. She tires quickly but is making progress. Yesterday, she walked more than previously, stopping to rest prn. Her R lower leg was hurting, limiting activity. Pain was mostly relieved by rest, still w/ mild discomfort. She was SOB w/ activity, but this is improving. No chest pain.   Yesterday pm, she got some upper L quadrant and lower L chest pain. She thought it was gas, she is constipated. It reached a 5/10, about the same level as the NSTEMI pain. She was in a lift chair when it started, could not get comfortable, no change w/ deep inspiration. Did not make her SOB, no N&V or diaphoresis.   About 3 am, she realized it reminded her of the NSTEMI pain>>called for help>>ASA by staff>>EMS to ER>>nitro x 1>>pain resolved.   She was having the pain almost every day before the stent, this is the first time she has had this pain since d/c.   She has chronic back problems, has a tender area in the area of the previous scar, wonders if she could get general anesthesia if another cath needed.    Past Medical History:  Diagnosis Date  . Allergy   . Anxiety   . Asthma   . Diabetes mellitus without complication ([place])   . Gout   . Hyperlipidemia   .  Hypertension   . Hypothyroid   . Neuropathy   . Obesity   . Vitamin D deficiency     Past Surgical History:  Procedure Laterality Date  . ABDOMINAL HYSTERECTOMY    . BREAST LUMPECTOMY    . CORONARY ATHERECTOMY N/A 06/11/2018   Procedure: CORONARY ATHERECTOMY;  Surgeon: Martinique, Peter M, MD;  Location: [place] [facility];  Service: Cardiovascular;  Laterality: N/A;  . CORONARY STENT INTERVENTION N/A 06/11/2018   Procedure: CORONARY STENT INTERVENTION;  Surgeon: Martinique, Peter M, MD;  Location: [place] [facility];  Service: Cardiovascular;  Laterality: N/A;  . LEFT HEART CATH AND CORONARY ANGIOGRAPHY N/A 06/06/2018   Procedure: LEFT HEART CATH AND CORONARY ANGIOGRAPHY;  Surgeon: Troy Sine, MD;  Location: [place] [facility];  Service: Cardiovascular;  Laterality: N/A;  . rectal fissure       Prior to Admission medications   Medication Sig Start Date End Date Taking? Authorizing Provider  ACCU-CHEK SOFTCLIX LANCETS lancets Test BG TID 12/02/17   Evelina Dun A, FNP  acyclovir (ZOVIRAX) 400 MG tablet TAKE 1 TABLET EVERY DAY 05/21/18   Hawks, Alyse Low A, FNP  albuterol (PROVENTIL HFA;VENTOLIN HFA) 108 (90 Base) MCG/ACT inhaler Inhale 1-2 puffs into the lungs every 6 (six) hours as needed for wheezing or shortness of breath.    [provider]  ALPRAZolam (XANAX) 0.5 MG tablet TAKE 1 TABLET 2 TIMES  A DAY AS NEEDED FOR ANXIETY 06/16/18   Ghimire, Henreitta Leber, MD  amLODipine (NORVASC) 10 MG tablet TAKE 1 TABLET EVERY DAY 07/09/17   Evelina Dun A, FNP  aspirin EC 81 MG EC tablet Take 1 tablet (81 mg total) by mouth daily. 06/17/18   Ghimire, Henreitta Leber, MD  B-D UF III MINI PEN NEEDLES 31G X 5 MM MISC USE AS INSTRUCTED TO GIVE LANTUS INJECTION TWICE DAILY 03/26/18   Evelina Dun A, FNP  clopidogrel (PLAVIX) 75 MG tablet Take 1 tablet (75 mg total) by mouth daily. 06/17/18   Ghimire, Henreitta Leber, MD  fluticasone (FLONASE) 50 MCG/ACT nasal spray USE 2 SPRAYS IN EACH NOSTRIL EVERY DAY 03/25/18    Evelina Dun A, FNP  furosemide (LASIX) 20 MG tablet TAKE 1 TABLET (20 MG TOTAL) BY MOUTH DAILY. 01/07/18   [name], FNP  gabapentin (NEURONTIN) 100 MG capsule Take 1 capsule (100 mg total) by mouth 3 (three) times daily. 06/19/18   Alma Friendly, MD  hydrALAZINE (APRESOLINE) 25 MG tablet Take 1 tablet (25 mg total) by mouth 2 (two) times daily. 06/16/18   Ghimire, Henreitta Leber, MD  HYDROcodone-acetaminophen (NORCO) 7.5-325 MG tablet Take 1 tablet by mouth every 6 (six) hours as needed for moderate pain. 06/17/18   Pokhrel, Laxman, MD  insulin aspart (NOVOLOG) 100 UNIT/ML injection CBG < 70: implement hypoglycemia protocol CBG 70 - 120: 0 units CBG 121 - 150: 0 units CBG 151 - 200: 0 units CBG 201 - 250: 2 units CBG 251 - 300: 3 units CBG 301 - 350: 4 units CBG 351 - 400: 5 units CBG > 400: call MD 06/16/18   Jonetta Osgood, MD  Insulin Glargine (LANTUS SOLOSTAR) 100 UNIT/ML Solostar Pen Inject 25 Units into the skin 2 (two) times daily. 06/16/18   Ghimire, Henreitta Leber, MD  Ipratropium-Albuterol (COMBIVENT RESPIMAT) 20-100 MCG/ACT AERS respimat Inhale 1 puff into the lungs every 6 (six) hours as needed for wheezing. 12/06/17   Evelina Dun A, FNP  isosorbide mononitrate (IMDUR) 30 MG 24 hr tablet Take 1 tablet (30 mg total) by mouth daily. 06/17/18   Ghimire, Henreitta Leber, MD  levothyroxine (SYNTHROID, LEVOTHROID) 50 MCG tablet TAKE 1 TABLET EVERY DAY BEFORE BREAKFAST 05/09/17   Claretta Fraise, MD  losartan (COZAAR) 100 MG tablet TAKE 1 TABLET EVERY DAY 07/09/17   Evelina Dun A, FNP  montelukast (SINGULAIR) 10 MG tablet TAKE 1 TABLET (10 MG TOTAL) BY MOUTH AT BEDTIME. 03/25/18   Evelina Dun A, FNP  omeprazole (PRILOSEC) 40 MG capsule TAKE 1 CAPSULE EVERY DAY 02/19/17   Evelina Dun A, FNP  rosuvastatin (CRESTOR) 20 MG tablet Take 2 tablets (40 mg total) by mouth at bedtime. 06/16/18 06/16/19  GhimireHenreitta Leber, MD    Inpatient Medications: Scheduled Meds: . acyclovir  400 mg Oral  Daily  . amLODipine  10 mg Oral Daily  . aspirin EC  81 mg Oral Daily  . clopidogrel  75 mg Oral Daily  . enoxaparin (LOVENOX) injection  40 mg Subcutaneous Q24H  . fluticasone  2 spray Each Nare Daily  . furosemide  20 mg Oral Daily  . gabapentin  100 mg Oral TID  . hydrALAZINE  25 mg Oral BID  . insulin aspart  0-20 Units Subcutaneous TID WC  . insulin aspart  0-5 Units Subcutaneous QHS  . insulin glargine  25 Units Subcutaneous BID  . isosorbide mononitrate  30 mg Oral Daily  . levothyroxine  50 mcg  Oral QAC breakfast  . losartan  100 mg Oral Daily  . pantoprazole  40 mg Oral Daily  . rosuvastatin  40 mg Oral QHS  . sodium chloride flush  3 mL Intravenous Q12H   Continuous Infusions: . sodium chloride     PRN Meds: sodium chloride, acetaminophen **OR** acetaminophen, ALPRAZolam, HYDROcodone-acetaminophen, ipratropium-albuterol, nitroGLYCERIN, ondansetron **OR** ondansetron (ZOFRAN) IV, sodium chloride flush  Allergies:    Allergies  Allergen Reactions  . Atorvastatin Other (See Comments)    Leg weakness.    Social History:   Social History   Socioeconomic History  . Marital status: Divorced    Spouse name: Not on file  . Number of children: Not on file  . Years of education: Not on file  . Highest education level: Not on file  Occupational History  . Occupation: Retired  Scientific laboratory technician  . Financial resource strain: Not on file  . Food insecurity:    Worry: Not on file    Inability: Not on file  . Transportation needs:    Medical: Not on file    Non-medical: Not on file  Tobacco Use  . Smoking status: Never Smoker  . Smokeless tobacco: Never Used  Substance and Sexual Activity  . Alcohol use: No  . Drug use: No  . Sexual activity: Not on file  Lifestyle  . Physical activity:    Days per week: Not on file    Minutes per session: Not on file  . Stress: Not on file  Relationships  . Social connections:    Talks on phone: Not on file    Gets together: Not  on file    Attends religious service: Not on file    Active member of club or organization: Not on file    Attends meetings of clubs or organizations: Not on file    Relationship status: Not on file  . Intimate partner violence:    Fear of current or ex partner: Not on file    Emotionally abused: Not on file    Physically abused: Not on file    Forced sexual activity: Not on file  Other Topics Concern  . Not on file  Social History Narrative  .  Currently at [facility] for rehab    Family History:   Family History  Problem Relation Age of Onset  . Cancer Mother        colon  . Diabetes Mother   . Hypertension Mother   . Dementia Mother   . Heart disease Father   . Asthma Sister   . Cancer Sister        breast  . Asthma Sister   . Scoliosis Sister   . Diabetes Sister   . Asthma Sister    Family Status:  Family Status  Relation Name Status  . Mother  Deceased  . Father  Deceased  . Sister  Alive  . Sister  Alive  . Sister  Alive    ROS:  Please see the history of present illness.  All other ROS reviewed and negative.     Physical Exam/Data:   Vitals:   06/26/18 0530 06/26/18 0541 06/26/18 0600 06/26/18 0630  BP: 131/79  (!) 122/33 92/61  Pulse: (!) 51  (!) 39 (!) 33  Resp: 16  13 20   Temp:      TempSrc:      SpO2: 98% 99% 98% 98%  Weight:      Height:  No intake or output data in the 24 hours ending 06/26/18 0913 Filed Weights   06/26/18 0243  Weight: 121 kg   Body mass index is 44.39 kg/m.  General:  Well nourished, well developed, in no acute distress HEENT: normal Lymph: no adenopathy Neck: no JVD Endocrine:  No thryomegaly Vascular: Radiation of murmur to carotids; 4/4 extremity pulses 2+ Cardiac:  normal S1, S2; RRR; 2/6 murmur  Lungs: Initially poor inspiratory effort, but clear to auscultation bilaterally, no wheezing, rhonchi or rales  Abd: soft, nontender, no hepatomegaly  Ext: Trace left pedal edema, chronic Musculoskeletal:   No deformities, BUE and BLE strength normal and equal Skin: warm and dry  Neuro:  CNs 2-12 intact, no focal abnormalities noted Psych:  Normal affect   EKG:  The EKG was personally reviewed and demonstrates: Mobitz 1, winky block with heart rate 48 Telemetry:  Telemetry was personally reviewed and demonstrates: Wenkebach with a heart rate frequently in the mid/low 40s  Relevant CV Studies:  ECHO: 06/06/2018 - Left ventricle: The cavity size was normal. Wall thickness was   increased in a pattern of moderate LVH. Systolic function was   normal. The estimated ejection fraction was in the range of 60%   to 65%. Wall motion was normal; there were no regional wall   motion abnormalities. Indeterminate diastolic function. - Aortic valve: Moderately calcified annulus. Trileaflet; mildly   calcified leaflets. - Mitral valve: Moderately calcified annulus. There was mild   regurgitation. - Left atrium: The atrium was mildly dilated. - Right atrium: Central venous pressure (est): 15 mm Hg. - Atrial septum: No defect or patent foramen ovale was identified. - Tricuspid valve: There was trivial regurgitation. - Pulmonary arteries: Systolic pressure could not be accurately   estimated. - Pericardium, extracardiac: A prominent pericardial fat pad was   present.  CATH: PCI, 06/11/2018  Prox RCA to Mid RCA lesion is 95% stenosed.  A drug-eluting stent was successfully placed using a STENT SYNERGY DES 4X20.  Post intervention, there is a 0% residual stenosis.  Dist RCA lesion is 65% stenosed.  Mid RCA lesion is 80% stenosed.  Post intervention, there is a 0% residual stenosis.  A drug-eluting stent was successfully placed using a STENT SYNERGY DES 3.5X20.  Acute Mrg lesion is 40% stenosed.   1. Successful PCI of the proximal and mid RCA with orbital atherectomy and stenting with DES x 2  Plan: observe in the unit tonight due to groin bleeding.   Recommend uninterrupted dual  antiplatelet therapy with Aspirin 81mg  daily and Clopidogrel 75mg  daily for a minimum of 12 months (ACS - Class I recommendation). Post-Intervention Diagram       Laboratory Data:  Chemistry Recent Labs  Lab 06/26/18 0439  NA 140  K 4.6  CL 104  CO2 27  GLUCOSE 236*  BUN 28*  CREATININE 1.18*  CALCIUM 9.1  GFRNONAA 47*  GFRAA 54*  ANIONGAP 9    Lab Results  Component Value Date   ALT 9 02/25/2018   AST 12 02/25/2018   ALKPHOS 118 (H) 02/25/2018   BILITOT 0.4 02/25/2018   Hematology Recent Labs  Lab 06/26/18 0439  WBC 10.0  RBC 3.78*  HGB 9.1*  HCT 31.3*  MCV 82.8  MCH 24.1*  MCHC 29.1*  RDW 15.7*  PLT 289   Cardiac Enzymes Recent Labs  Lab 06/26/18 0439  TROPONINI <0.03   No results for input(s): TROPIPOC in the last 168 hours.   TSH:  Lab Results  Component Value Date   TSH 1.822 06/05/2018   Lipids: Lab Results  Component Value Date   CHOL 170 06/09/2018   HDL 40 (L) 06/09/2018   LDLCALC 97 06/09/2018   TRIG 165 (H) 06/09/2018   CHOLHDL 4.3 06/09/2018   HgbA1c: Lab Results  Component Value Date   HGBA1C 7.3 (H) 06/05/2018    Radiology/Studies:  Dg Chest Port 1 View  Result Date: 06/26/2018 CLINICAL DATA:  Chest pain and shortness of breath. Similar pain 2 weeks ago with STEMI. History of asthma, diabetes, hypertension, breast lumpectomy EXAM: PORTABLE CHEST 1 VIEW COMPARISON:  06/05/2018 FINDINGS: Cardiac enlargement. No airspace disease or consolidation in the lungs. No blunting of costophrenic angles. No pneumothorax. Mediastinal contours appear intact. Calcification of the aorta. IMPRESSION: No active disease. Electronically Signed   By: Lucienne Capers M.D.   On: 06/26/2018 04:02    Assessment and Plan:   Principal Problem: 1.  Chest pain - Symptoms remind her of her non-STEMI pain, but ECG is not acute and enzymes are negative. -She has a substrate for angina with a 90% proximal circumflex -SBP 92 after sublingual  nitroglycerin, but has been on Imdur 30 mg daily, will increase to 60 mg daily - Can do P2 Y 12, but it is a send out and results will take a while - MD advise on getting interventional colleagues to review films and decide if circumflex could be treated with PCI - If so, patient would need transfer to [facility] -She also has chronic GI issues and is a diabetic, so symptoms may not be cardiac in origin. -Discuss further plans with MD. -Check P2 Y 12  Otherwise, per IM Active Problems:   Hypothyroidism   Hypertension associated with diabetes ([place])   Hyperlipidemia associated with type 2 diabetes mellitus ([place])   Obesity, morbid, BMI 40.0-49.9 ([place])   NSTEMI (non-ST elevated myocardial infarction) ([place]), 05/2018   Cerebral embolism with cerebral infarction   For questions or updates, please contact [facility] Please consult [email or web address] for contact info under Cardiology/STEMI.   SignedRosaria Ferries, PA-C  06/26/2018 9:13 AM   Attending note:  Patient seen and examined.  I reviewed extensive records and discussed the case with Ms. Ahmed Prima PA-C as well as Dr. Manuella Ghazi on the hospitalist team.  Ms. Walt was recently hospitalized in mid to late September with NSTEMI and finding of severe multivessel CAD.  She was felt to be a poor candidate for CABG and ultimately underwent DES x2 to the RCA.  Residual disease includes severe stenosis within the circumflex with complex disease extending into the left main - this was described as being a poor PCI candidate by Dr. Burt Knack based on my review of the chart.  She has been residing at [facility] undergoing rehabilitation.  Fatigued with activity but making some progress until recent onset of angina symptoms that have improved with nitroglycerin in the ER.  Troponin I levels are negative for ACS and ECG shows no acute changes although circumflex distribution can be electrically negative.  Assessment in the ER she is pain-free, heart rate is in  the low 50s, dipping into the low 40s.  Systolic blood pressure in the 120s to 140s.  Lungs are clear with decreased breath sounds and no wheezing.  Cardiac exam reveals largely RRR with soft systolic murmur.  Lab work shows potassium 4.6, BUN 28, creatinine 1.18, troponin I negative x2, hemoglobin 9.1, platelets 289.  Chest x-ray reports no acute process.   I personally reviewed  multiple serial ECGs.  Patient is described as having sinus rhythm with second-degree type I heart block, although clearly also has episodes of 2:1 heart block and more recently what looks to be higher degree complete heart block with junctional escape.  I discussed symptoms with the patient, also went over the case with Dr. Manuella Ghazi.  Instead of having her admitted to the hospitalist team at [facility], I would suggest having her admitted over at [facility] so that EP consultation can be obtained.  It could be that she is having symptomatic bradycardia in association with higher degree heart block than initially suspected, currently not on AV nodal blockers.  Question would be whether pacemaker is indicated to improve symptoms and allow further optimization of medical therapy.  She is not necessarily a candidate for repeat cardiac catheterization particularly in light of negative cardiac enzymes and limited revascularization options.  Satira Sark, M.D., F.A.C.C.

## 2018-06-26 NOTE — ED Notes (Signed)
At nurse request, patient given breakfast tray.

## 2018-06-26 NOTE — Plan of Care (Signed)
Heart Block with SVR. Asymptomatic. Cardiac Monitor. Falls Risk. Reinforce safety measure's. Instructed to call for needs/oob etc. Oriented to room and Call bell. POC updated.

## 2018-06-26 NOTE — ED Triage Notes (Signed)
Pt brought in by [facility] for c/o chest pain that started earlier today; pt had a stemi x 2 weeks ago and had a cardiac cath with 2 stents placed; pt had 324mg  of asa with ems; cbg 324

## 2018-06-26 NOTE — ED Provider Notes (Signed)
[facility] EMERGENCY DEPARTMENT Provider Note   CSN: [id number] Arrival date & time: 06/26/18  5400     History   Chief Complaint Chief Complaint  Patient presents with  . Chest Pain    HPI SEANNE CHIRICO is a 67 y.o. female.  The history is provided by the patient.  Chest Pain   This is a new problem. The current episode started 6 to 12 hours ago. The problem occurs constantly. The pain is present in the substernal region. The pain is moderate. The quality of the pain is described as pressure-like. The pain does not radiate. Associated symptoms include shortness of breath and weakness. Pertinent negatives include no fever, no syncope and no vomiting. Treatments tried: ASA. Risk factors include obesity.  Her past medical history is significant for CAD.  Patient presents from nursing facility for chest pain.  She reports several hours ago she began having chest pressure and shortness of breath.  She first thought it was heartburn, but then realized it was probably her heart.  She is now chest pain-free.  She also reports generalized weakness, but no LOC.  Denies fevers or vomiting.  She reports shortness of breath.  Past Medical History:  Diagnosis Date  . Allergy   . Anxiety   . Asthma   . Diabetes mellitus without complication ([place])   . Gout   . Hyperlipidemia   . Hypertension   . Hypothyroid   . Neuropathy   . Obesity   . Vitamin D deficiency     Patient Active Problem List   Diagnosis Date Noted  . Cerebral embolism with cerebral infarction 06/09/2018  . NSTEMI (non-ST elevated myocardial infarction) ([place])   . Chest pain 06/05/2018  . Elevated troponin 06/05/2018  . Noncompliance 11/05/2017  . Genital herpes 08/02/2016  . Pain medication agreement signed 05/01/2016  . Uncomplicated opioid dependence ([place]) 05/01/2016  . Chronic back pain 08/10/2015  . 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 Depression 11/15/2014  . GERD  (gastroesophageal reflux disease) 11/15/2014  . Hypertension associated with diabetes ([place])   . Hyperlipidemia associated with type 2 diabetes mellitus ([place])   . Diabetes mellitus type 2, uncontrolled, without complications ([place])   . Anxiety   . Obesity, morbid, BMI 40.0-49.9 ([place])   . Allergy   . Asthma   . Vitamin D deficiency   . Neuropathy   . Hypothyroidism 02/25/2013    Past Surgical History:  Procedure Laterality Date  . ABDOMINAL HYSTERECTOMY    . BREAST LUMPECTOMY    . CORONARY ATHERECTOMY N/A 06/11/2018   Procedure: CORONARY ATHERECTOMY;  Surgeon: Martinique, Peter M, MD;  Location: [place] [facility];  Service: Cardiovascular;  Laterality: N/A;  . CORONARY STENT INTERVENTION N/A 06/11/2018   Procedure: CORONARY STENT INTERVENTION;  Surgeon: Martinique, Peter M, MD;  Location: [place] [facility];  Service: Cardiovascular;  Laterality: N/A;  . LEFT HEART CATH AND CORONARY ANGIOGRAPHY N/A 06/06/2018   Procedure: LEFT HEART CATH AND CORONARY ANGIOGRAPHY;  Surgeon: Troy Sine, MD;  Location: [place] [facility];  Service: Cardiovascular;  Laterality: N/A;  . rectal fissure       OB History    Gravida  3   Para  3   Term  3   Preterm      AB      Living  2     SAB      TAB      Ectopic      Multiple  Live Births               Home Medications    Prior to Admission medications   Medication Sig Start Date End Date Taking? Authorizing Provider  ACCU-CHEK SOFTCLIX LANCETS lancets Test BG TID 12/02/17   Evelina Dun A, FNP  acyclovir (ZOVIRAX) 400 MG tablet TAKE 1 TABLET EVERY DAY 05/21/18   Hawks, Alyse Low A, FNP  albuterol (PROVENTIL HFA;VENTOLIN HFA) 108 (90 Base) MCG/ACT inhaler Inhale 1-2 puffs into the lungs every 6 (six) hours as needed for wheezing or shortness of breath.    [provider]  ALPRAZolam Duanne Moron) 0.5 MG tablet TAKE 1 TABLET 2 TIMES A DAY AS NEEDED FOR ANXIETY 06/16/18   Ghimire, Henreitta Leber, MD  amLODipine (NORVASC) 10 MG tablet  TAKE 1 TABLET EVERY DAY 07/09/17   Evelina Dun A, FNP  aspirin EC 81 MG EC tablet Take 1 tablet (81 mg total) by mouth daily. 06/17/18   Ghimire, Henreitta Leber, MD  B-D UF III MINI PEN NEEDLES 31G X 5 MM MISC USE AS INSTRUCTED TO GIVE LANTUS INJECTION TWICE DAILY 03/26/18   Evelina Dun A, FNP  clopidogrel (PLAVIX) 75 MG tablet Take 1 tablet (75 mg total) by mouth daily. 06/17/18   Ghimire, Henreitta Leber, MD  fluticasone (FLONASE) 50 MCG/ACT nasal spray USE 2 SPRAYS IN EACH NOSTRIL EVERY DAY 03/25/18   Evelina Dun A, FNP  furosemide (LASIX) 20 MG tablet TAKE 1 TABLET (20 MG TOTAL) BY MOUTH DAILY. 01/07/18   [name], FNP  gabapentin (NEURONTIN) 100 MG capsule Take 1 capsule (100 mg total) by mouth 3 (three) times daily. 06/19/18   Alma Friendly, MD  hydrALAZINE (APRESOLINE) 25 MG tablet Take 1 tablet (25 mg total) by mouth 2 (two) times daily. 06/16/18   Ghimire, Henreitta Leber, MD  HYDROcodone-acetaminophen (NORCO) 7.5-325 MG tablet Take 1 tablet by mouth every 6 (six) hours as needed for moderate pain. 06/17/18   Pokhrel, Laxman, MD  insulin aspart (NOVOLOG) 100 UNIT/ML injection CBG < 70: implement hypoglycemia protocol CBG 70 - 120: 0 units CBG 121 - 150: 0 units CBG 151 - 200: 0 units CBG 201 - 250: 2 units CBG 251 - 300: 3 units CBG 301 - 350: 4 units CBG 351 - 400: 5 units CBG > 400: call MD 06/16/18   Jonetta Osgood, MD  Insulin Glargine (LANTUS SOLOSTAR) 100 UNIT/ML Solostar Pen Inject 25 Units into the skin 2 (two) times daily. 06/16/18   Ghimire, Henreitta Leber, MD  Ipratropium-Albuterol (COMBIVENT RESPIMAT) 20-100 MCG/ACT AERS respimat Inhale 1 puff into the lungs every 6 (six) hours as needed for wheezing. 12/06/17   Evelina Dun A, FNP  isosorbide mononitrate (IMDUR) 30 MG 24 hr tablet Take 1 tablet (30 mg total) by mouth daily. 06/17/18   Ghimire, Henreitta Leber, MD  levothyroxine (SYNTHROID, LEVOTHROID) 50 MCG tablet TAKE 1 TABLET EVERY DAY BEFORE BREAKFAST 05/09/17   Claretta Fraise, MD    losartan (COZAAR) 100 MG tablet TAKE 1 TABLET EVERY DAY 07/09/17   Evelina Dun A, FNP  montelukast (SINGULAIR) 10 MG tablet TAKE 1 TABLET (10 MG TOTAL) BY MOUTH AT BEDTIME. 03/25/18   Evelina Dun A, FNP  omeprazole (PRILOSEC) 40 MG capsule TAKE 1 CAPSULE EVERY DAY 02/19/17   Evelina Dun A, FNP  rosuvastatin (CRESTOR) 20 MG tablet Take 2 tablets (40 mg total) by mouth at bedtime. 06/16/18 06/16/19  GhimireHenreitta Leber, MD    Family History Family History  Problem Relation  Age of Onset  . Cancer Mother        colon  . Diabetes Mother   . Hypertension Mother   . Dementia Mother   . Heart disease Father   . Asthma Sister   . Cancer Sister        breast  . Asthma Sister   . Scoliosis Sister   . Diabetes Sister   . Asthma Sister     Social History Social History   Tobacco Use  . Smoking status: Never Smoker  . Smokeless tobacco: Never Used  Substance Use Topics  . Alcohol use: No  . Drug use: No     Allergies   Atorvastatin   Review of Systems Review of Systems  Constitutional: Negative for fever.  Respiratory: Positive for shortness of breath.   Cardiovascular: Positive for chest pain. Negative for syncope.  Gastrointestinal: Negative for vomiting.  Neurological: Positive for weakness.  All other systems reviewed and are negative.    Physical Exam Updated Vital Signs BP (!) 160/60   Pulse (!) 54   Temp 97.7 F (36.5 C) (Oral)   Resp (!) 25   Ht 1.651 m (5\' 5" )   Wt 121 kg   SpO2 99%   BMI 44.39 kg/m   Physical Exam CONSTITUTIONAL: Chronically ill-appearing HEAD: Normocephalic/atraumatic EYES: EOMI/PERRL ENMT: Mucous membranes moist NECK: supple no meningeal signs CV: S1/S2 noted, no murmurs/rubs/gallops noted LUNGS: Bibasilar crackles, no acute distress ABDOMEN: soft, nontender, no rebound or guarding, bowel sounds noted throughout abdomen GU:no cva tenderness NEURO: Pt is awake/alert/appropriate, moves all extremitiesx4.  No facial droop.    EXTREMITIES: pulses normal/equal, full ROM SKIN: warm, color normal PSYCH: no abnormalities of mood noted, alert and oriented to situation   ED Treatments / Results  Labs (all labs ordered are listed, but only abnormal results are displayed) Labs Reviewed  BASIC METABOLIC PANEL - Abnormal; Notable for the following components:      Result Value   Glucose, Bld 236 (*)    BUN 28 (*)    Creatinine, Ser 1.18 (*)    GFR calc non Af Amer 47 (*)    GFR calc Af Amer 54 (*)    All other components within normal limits  CBC WITH DIFFERENTIAL/PLATELET - Abnormal; Notable for the following components:   RBC 3.78 (*)    Hemoglobin 9.1 (*)    HCT 31.3 (*)    MCH 24.1 (*)    MCHC 29.1 (*)    RDW 15.7 (*)    Abs Immature Granulocytes 0.11 (*)    All other components within normal limits  TROPONIN I    EKG EKG Interpretation  Date/Time:  Thursday June 26 2018 03:10:15 EDT Ventricular Rate:  43 PR Interval:    QRS Duration: 86 QT Interval:  522 QTC Calculation: 441 R Axis:   70 Text Interpretation:  ** Critical Test Result: AV Block Sinus rhythm with 2nd degree A-V block (Mobitz I) with 2:1 A-V conduction Septal infarct , age undetermined Abnormal ECG Confirmed by Ripley Fraise [phone]) on 06/26/2018 3:25:44 AM   EKG Interpretation  Date/Time:  Thursday June 26 2018 04:01:10 EDT Ventricular Rate:  57 PR Interval:    QRS Duration: 100 QT Interval:  509 QTC Calculation: 496 R Axis:   82 Text Interpretation:  suspect mobitz type 1 Anteroseptal infarct, age indeterminate Abnormal ekg Confirmed by Ripley Fraise [phone]) on 06/26/2018 4:09:09 AM       EKG Interpretation  Date/Time:  Thursday June 26 2018  06:11:17 EDT Ventricular Rate:  48 PR Interval:    QRS Duration: 115 QT Interval:  514 QTC Calculation: 460 R Axis:   80 Text Interpretation:  Sinus bradycardia Mobitz I 2-degree AV block (Wenckebach block) Borderline prolonged PR interval Nonspecific  intraventricular conduction delay Anteroseptal infarct, age indeterminate Confirmed by Ripley Fraise ([id number]) on 06/26/2018 6:18:39 AM        Radiology Dg Chest Port 1 View  Result Date: 06/26/2018 CLINICAL DATA:  Chest pain and shortness of breath. Similar pain 2 weeks ago with STEMI. History of asthma, diabetes, hypertension, breast lumpectomy EXAM: PORTABLE CHEST 1 VIEW COMPARISON:  06/05/2018 FINDINGS: Cardiac enlargement. No airspace disease or consolidation in the lungs. No blunting of costophrenic angles. No pneumothorax. Mediastinal contours appear intact. Calcification of the aorta. IMPRESSION: No active disease. Electronically Signed   By: Lucienne Capers M.D.   On: 06/26/2018 04:02    Procedures Procedures  CRITICAL CARE Performed by: Sharyon Cable Total critical care time: 30 minutes Critical care time was exclusive of separately billable procedures and treating other patients. Critical care was necessary to treat or prevent imminent or life-threatening deterioration. Critical care was time spent personally by me on the following activities: development of treatment plan with patient and/or surrogate as well as nursing, discussions with consultants, evaluation of patient's response to treatment, examination of patient, obtaining history from patient or surrogate, ordering and performing treatments and interventions, ordering and review of laboratory studies, ordering and review of radiographic studies, pulse oximetry and re-evaluation of patient's condition.  Medications Ordered in ED Medications  nitroGLYCERIN (NITROSTAT) SL tablet 0.4 mg (0.4 mg Sublingual Given 06/26/18 0518)  ipratropium-albuterol (DUONEB) 0.5-2.5 (3) MG/3ML nebulizer solution 3 mL (3 mLs Nebulization Given 06/26/18 0539)     Initial Impression / Assessment and Plan / ED Course  I have reviewed the triage vital signs and the nursing notes.  Pertinent labs & imaging results that were available  during my care of the patient were reviewed by me and considered in my medical decision making (see chart for details).     5:13 AM Patient presents with chest pain.  On initial evaluation she denies chest pain, but now reports she is returning.  She has already been given aspirin, will add on nitroglycerin. She recently in the hospital for a non-STEMI, was found to have triple-vessel disease, and also underwent PCI of RCA. She has known Mobitz type I on EKG.  She is not on beta-blocker.  EKGs here in the ER looks similar to prior. No hemodynamic compromise at this time. 7:20 AM Pt has had no further CP episodes However she feels her asthma worsened She is now currently resting comfortably Patient is resting, her heart rate will drop and immediately corrected the 60s.  Blood pressures remained stable in the low 100s. Multiple EKGs have consistently showed probable type I mobitz Due to recurrence of chest pain, will admit to the hospital.  Discussed with Dr. Manuella Ghazi for admission.  Final Clinical Impressions(s) / ED Diagnoses   Final diagnoses:  Bradycardia  Unstable angina [facility])    ED Discharge Orders    None       Ripley Fraise, MD 06/26/18 [phone]

## 2018-06-27 ENCOUNTER — Encounter (HOSPITAL_COMMUNITY): Payer: Self-pay | Admitting: Internal Medicine

## 2018-06-27 ENCOUNTER — Encounter (HOSPITAL_COMMUNITY)
Admission: EM | Disposition: A | Discharge status: Skilled Nursing Facility | Payer: Self-pay | Source: Home / Self Care | Attending: Family Medicine

## 2018-06-27 DIAGNOSIS — I441 Atrioventricular block, second degree: Secondary | ICD-10-CM

## 2018-06-27 DIAGNOSIS — R001 Bradycardia, unspecified: Secondary | ICD-10-CM

## 2018-06-27 DIAGNOSIS — E039 Hypothyroidism, unspecified: Secondary | ICD-10-CM

## 2018-06-27 HISTORY — PX: PACEMAKER IMPLANT: EP1218

## 2018-06-27 LAB — CBC
HCT: 30.6 % — ABNORMAL LOW (ref 36.0–46.0)
Hemoglobin: 8.9 g/dL — ABNORMAL LOW (ref 12.0–15.0)
MCH: 24.2 pg — ABNORMAL LOW (ref 26.0–34.0)
MCHC: 29.1 g/dL — ABNORMAL LOW (ref 30.0–36.0)
MCV: 83.2 fL (ref 80.0–100.0)
Platelets: 243 10*3/uL (ref 150–400)
RBC: 3.68 MIL/uL — ABNORMAL LOW (ref 3.87–5.11)
RDW: 15.6 % — ABNORMAL HIGH (ref 11.5–15.5)
WBC: 9.3 10*3/uL (ref 4.0–10.5)
nRBC: 0 % (ref 0.0–0.2)

## 2018-06-27 LAB — BASIC METABOLIC PANEL
Anion gap: 11 (ref 5–15)
BUN: 37 mg/dL — AB (ref 8–23)
CHLORIDE: 101 mmol/L (ref 98–111)
CO2: 26 mmol/L (ref 22–32)
Calcium: 8.9 mg/dL (ref 8.9–10.3)
Creatinine, Ser: 1.38 mg/dL — ABNORMAL HIGH (ref 0.44–1.00)
GFR calc Af Amer: 45 mL/min — ABNORMAL LOW (ref >60)
GFR calc non Af Amer: 39 mL/min — ABNORMAL LOW (ref >60)
Glucose, Bld: 296 mg/dL — ABNORMAL HIGH (ref 70–99)
POTASSIUM: 4.8 mmol/L (ref 3.5–5.1)
SODIUM: 138 mmol/L (ref 135–145)

## 2018-06-27 LAB — GLUCOSE, CAPILLARY
Glucose-Capillary: 286 mg/dL — ABNORMAL HIGH (ref 70–99)
Glucose-Capillary: 221 mg/dL — ABNORMAL HIGH (ref 70–99)
Glucose-Capillary: 80 mg/dL (ref 70–99)
Glucose-Capillary: 162 mg/dL — ABNORMAL HIGH (ref 70–99)

## 2018-06-27 LAB — SURGICAL PCR SCREEN
MRSA, PCR: NEGATIVE
Staphylococcus aureus: NEGATIVE

## 2018-06-27 SURGERY — PACEMAKER IMPLANT
Anesthesia: LOCAL

## 2018-06-27 MED ORDER — MIDAZOLAM HCL 5 MG/5ML IJ SOLN
INTRAMUSCULAR | Status: DC | PRN
  Administered 2018-06-27 (×2): 1 mg via INTRAVENOUS

## 2018-06-27 MED ORDER — CHLORHEXIDINE GLUCONATE 4 % EX LIQD
60.0000 mL | Freq: Once | CUTANEOUS | Status: AC
  Administered 2018-06-27: 4 via TOPICAL

## 2018-06-27 MED ORDER — ISOSORBIDE MONONITRATE ER 60 MG PO TB24
60.0000 mg | ORAL_TABLET | Freq: Every day | ORAL | Status: DC
  Administered 2018-06-28 – 2018-07-01 (×4): 60 mg via ORAL
  Filled 2018-06-27 (×4): qty 1

## 2018-06-27 MED ORDER — FENTANYL CITRATE (PF) 100 MCG/2ML IJ SOLN
INTRAMUSCULAR | Status: DC | PRN
  Administered 2018-06-27 (×2): 25 ug via INTRAVENOUS

## 2018-06-27 MED ORDER — LIDOCAINE HCL (PF) 1 % IJ SOLN
INTRAMUSCULAR | Status: DC | PRN
  Administered 2018-06-27: 45 mL

## 2018-06-27 MED ORDER — SODIUM CHLORIDE 0.9 % IV SOLN: 250.0000 mL | INTRAVENOUS | Status: DC | PRN

## 2018-06-27 MED ORDER — SODIUM CHLORIDE 0.9% FLUSH: 3.0000 mL | Freq: Two times a day (BID) | INTRAVENOUS | Status: DC

## 2018-06-27 MED ORDER — HYDROCODONE-ACETAMINOPHEN 5-325 MG PO TABS
1.0000 | ORAL_TABLET | ORAL | Status: DC | PRN
  Administered 2018-06-27 (×2): 1 via ORAL
  Administered 2018-06-28: 2 via ORAL
  Administered 2018-06-28: 1 via ORAL
  Administered 2018-06-28 – 2018-06-29 (×4): 2 via ORAL
  Administered 2018-06-30: 1 via ORAL
  Filled 2018-06-27: qty 1
  Filled 2018-06-27 (×2): qty 2
  Filled 2018-06-27 (×2): qty 1
  Filled 2018-06-27: qty 2
  Filled 2018-06-27: qty 1
  Filled 2018-06-27 (×2): qty 2

## 2018-06-27 MED ORDER — IOPAMIDOL (ISOVUE-370) INJECTION 76%
INTRAVENOUS | Status: AC
  Filled 2018-06-27: qty 50

## 2018-06-27 MED ORDER — DEXTROSE 5 % IV SOLN
3.0000 g | INTRAVENOUS | Status: AC
  Administered 2018-06-27: 3 g via INTRAVENOUS
  Filled 2018-06-27: qty 3000
  Filled 2018-06-27: qty 3

## 2018-06-27 MED ORDER — SODIUM CHLORIDE 0.9 % IV SOLN
INTRAVENOUS | Status: AC
  Filled 2018-06-27: qty 2

## 2018-06-27 MED ORDER — CEFAZOLIN SODIUM-DEXTROSE 1-4 GM/50ML-% IV SOLN
1.0000 g | Freq: Four times a day (QID) | INTRAVENOUS | Status: AC
  Administered 2018-06-27 – 2018-06-28 (×3): 1 g via INTRAVENOUS
  Filled 2018-06-27 (×3): qty 50

## 2018-06-27 MED ORDER — SODIUM CHLORIDE 0.9 % IV SOLN
INTRAVENOUS | Status: DC
  Administered 2018-06-27: 11:00:00 via INTRAVENOUS

## 2018-06-27 MED ORDER — SODIUM CHLORIDE 0.9% FLUSH: 3.0000 mL | INTRAVENOUS | Status: DC | PRN

## 2018-06-27 MED ORDER — LIDOCAINE HCL (PF) 1 % IJ SOLN
INTRAMUSCULAR | Status: AC
  Filled 2018-06-27: qty 60

## 2018-06-27 MED ORDER — SODIUM CHLORIDE 0.9% FLUSH
3.0000 mL | Freq: Two times a day (BID) | INTRAVENOUS | Status: DC
  Administered 2018-06-27 – 2018-07-01 (×8): 3 mL via INTRAVENOUS

## 2018-06-27 MED ORDER — FENTANYL CITRATE (PF) 100 MCG/2ML IJ SOLN
INTRAMUSCULAR | Status: AC
  Filled 2018-06-27: qty 2

## 2018-06-27 MED ORDER — HEPARIN (PORCINE) IN NACL 1000-0.9 UT/500ML-% IV SOLN
INTRAVENOUS | Status: DC | PRN
  Administered 2018-06-27: 500 mL

## 2018-06-27 MED ORDER — IOPAMIDOL (ISOVUE-370) INJECTION 76%
INTRAVENOUS | Status: DC | PRN
  Administered 2018-06-27: 15 mL via INTRAVENOUS

## 2018-06-27 MED ORDER — SODIUM CHLORIDE 0.9 % IV SOLN
80.0000 mg | INTRAVENOUS | Status: AC
  Administered 2018-06-27: 80 mg

## 2018-06-27 MED ORDER — CEFAZOLIN SODIUM-DEXTROSE 2-4 GM/100ML-% IV SOLN
INTRAVENOUS | Status: AC
  Filled 2018-06-27: qty 100

## 2018-06-27 MED ORDER — MIDAZOLAM HCL 5 MG/5ML IJ SOLN
INTRAMUSCULAR | Status: AC
  Filled 2018-06-27: qty 5

## 2018-06-27 MED ORDER — HEPARIN (PORCINE) IN NACL 1000-0.9 UT/500ML-% IV SOLN
INTRAVENOUS | Status: AC
  Filled 2018-06-27: qty 500

## 2018-06-27 MED ORDER — SODIUM CHLORIDE 0.9 % IV SOLN: 250.0000 mL | INTRAVENOUS | Status: DC

## 2018-06-27 MED ORDER — ONDANSETRON HCL 4 MG/2ML IJ SOLN: 4.0000 mg | Freq: Four times a day (QID) | INTRAMUSCULAR | Status: DC | PRN

## 2018-06-27 MED ORDER — ACETAMINOPHEN 325 MG PO TABS
325.0000 mg | ORAL_TABLET | ORAL | Status: DC | PRN
  Administered 2018-06-27: 650 mg via ORAL
  Filled 2018-06-27: qty 2

## 2018-06-27 MED ORDER — CHLORHEXIDINE GLUCONATE 4 % EX LIQD
60.0000 mL | Freq: Once | CUTANEOUS | Status: AC
  Administered 2018-06-27: 4 via TOPICAL
  Filled 2018-06-27: qty 60

## 2018-06-27 SURGICAL SUPPLY — 13 items
CABLE SURGICAL S-101-97-12 (CABLE) ×4 IMPLANT
CATH RIGHTSITE C315HIS02 (CATHETERS) ×2 IMPLANT
HOVERMATT SINGLE USE (MISCELLANEOUS) ×2 IMPLANT
IPG PACE AZUR XT DR MRI W1DR01 (Pacemaker) ×1 IMPLANT
LEAD CAPSURE NOVUS 5076-52CM (Lead) ×2 IMPLANT
LEAD SELECT SECURE 3830 383069 (Lead) ×1 IMPLANT
PACE AZURE XT DR MRI W1DR01 (Pacemaker) ×2 IMPLANT
PAD PRO RADIOLUCENT 2001M-C (PAD) ×2 IMPLANT
SELECT SECURE 3830 383069 (Lead) ×2 IMPLANT
SHEATH CLASSIC 7F (SHEATH) ×4 IMPLANT
SLITTER 6232ADJ (MISCELLANEOUS) ×2 IMPLANT
TRAY PACEMAKER INSERTION (PACKS) ×2 IMPLANT
WIRE HI TORQ VERSACORE-J 145CM (WIRE) IMPLANT
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
## 2018-06-27 NOTE — Care Management Note (Addendum)
Case Management Note  Patient Details  Name: Mary Raymond MRN: [id number] Date of Birth: 07-Jul-1951  Subjective/Objective: Pt presented from [facility] for advanced heart block. Plan for PPM. CSW to monitor for disposition needs.                   Action/Plan: CM will continue to monitor as well.   Expected Discharge Date:                  Expected Discharge Plan:  Skilled Nursing Facility  In-House Referral:  Clinical Social Work  Discharge planning Services  CM Consult  Post Acute Care Choice:  NA Choice offered to:  NA  DME Arranged:  N/A DME Agency:  NA  HH Arranged:  NA HH Agency:  NA  Status of Service:  Completed, signed off  If discussed at [place] of Stay Meetings, dates discussed:    Additional Comments: 0924 07-01-18 Jacqlyn Krauss, RN,BSN [phone] Awaiting insurance authorization- plan for SNF at [facility]. No further needs from CM at this time.  Bethena Roys, RN 06/27/2018, 10:47 AM
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
## 2018-06-27 NOTE — Progress Notes (Signed)
Inpatient Diabetes Program Recommendations  AACE/ADA: New Consensus Statement on Inpatient Glycemic Control (2015)  Target Ranges:  Prepandial:   less than 140 mg/dL      Peak postprandial:   less than 180 mg/dL (1-2 hours)      Critically ill patients:  140 - 180 mg/dL   Results for Mary Raymond, Mary Raymond (MRN [id number]) as of 06/27/2018 08:18  Ref. Range 06/26/2018 16:01 06/26/2018 21:44  Glucose-Capillary Latest Ref Range: 70 - 99 mg/dL 279 (H)  22 units NOVOLOG  334 (H)  4 units NOVOLOG +  25 units LANTUS    Results for Mary Raymond, Mary Raymond (MRN [id number]) as of 06/27/2018 08:18  Ref. Range 06/27/2018 07:47  Glucose-Capillary Latest Ref Range: 70 - 99 mg/dL 286 (H)   Results for Mary Raymond, Mary Raymond (MRN [id number]) as of 06/27/2018 08:18  Ref. Range 06/05/2018 19:47  Hemoglobin A1C Latest Ref Range: 4.8 - 5.6 % 7.3 (H)    Admit with: CP/ Unstable Angina  History: DM, CAD, CKD  Home DM Meds: Lantus 25 units BID       Novolog 0-5 units per SSI  Current Orders: Lantus 25 units BID     Novolog Resistant Correction Scale/ SSI (0-20 units) TID AC + HS      Lantus started last PM.  Next dose Lantus due this AM.  Current A1c shows good glucose control at home.    --Will follow patient during hospitalization--  Wyn Quaker RN, MSN, CDE Diabetes Coordinator Inpatient Glycemic Control Team Team Pager: [phone] (8a-5p)

## 2018-06-27 NOTE — Interval H&P Note (Signed)
History and Physical Interval Note:  06/27/2018 10:57 AM  Mary Raymond  has presented today for surgery, with the diagnosis of 2nd Degree Heart Block  The various methods of treatment have been discussed with the patient and family. After consideration of risks, benefits and other options for treatment, the patient has consented to  Procedure(s): PACEMAKER IMPLANT (N/A) as a surgical intervention .  The patient's history has been reviewed, patient examined, no change in status, stable for surgery.  I have reviewed the patient's chart and labs.  Questions were answered to the patient's satisfaction.     Mary Raymond

## 2018-06-27 NOTE — Progress Notes (Signed)
PROGRESS NOTE    Mary Raymond  ASN:[id number] DOB: [birth year] DOA: 06/26/2018 [name], FNP      Brief Narrative:  Mary Raymond is a 67 y.o. 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  Cardiology at [facility] titrated Imdur for CP. 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 -Consult to EP, appreciate cares -No nodal blocking agents  Hypertension Blood pressures elevated -Continue amlodipine, losartan, furosemide -Titrate Imdur as recommended by cardiology at any pain -Hold hydralazine while titrating Imdur  Fibromyalgia -Continue gabapentin  Diabetes Glucoses within normal limits -Continue Lantus -Continue sliding scale  Hypothyroidism -Continue levothyroxine  Other medications -Continue acyclovir suppression -Continue pantoprazole -Continue alprazolam as needed -Hold Singulair  Chronic kidney disease stage III Creatinine essentially stable  Normocytic anemia Hemoglobin at baseline     DVT prophylaxis: Lovenox Code Status: full Code Family Communication: None at the bedside MDM and disposition Plan: The below labs and imaging reports were reviewed and summarized above.  Medication management as above.  The patient was admitted with chest pain, found to have high-grade AV block.  Transferred to [facility] for pacemaker.  Evaluation by EP today, it appears they are planning to put in a pacemaker this afternoon.   Consultants:   Neurology  Elective physiology  Procedures:    None  Antimicrobials:   None   Subjective: Feeling well.  No further chest pain, no palpitations, no orthopnea, leg swelling.  No dyspnea, confusion, fever, cough.  Objective: Vitals:   06/26/18 2027 06/27/18 0255 06/27/18 0309 06/27/18 0828  BP: (!) 142/44  139/64 (!) 153/64  Pulse: (!) 48  (!) 33   Resp: 11  18   Temp: (!) 97.5 F (36.4 C)  (!) 97.4 F (36.3 C)   TempSrc: Oral  Oral   SpO2: 96%  95%   Weight:  121.7 kg    Height:        Intake/Output Summary (Last 24 hours) at 06/27/2018 1037 Last data filed at 06/27/2018 0830 Gross per 24 hour  Intake 363 ml  Output -  Net 363 ml   Filed Weights   06/26/18 0243 06/26/18 1300 06/27/18 0255  Weight: 121 kg 122.2 kg 121.7 kg    Examination: General appearance: Obese adult female, alert and in no acute distress.  Sitting up in the side of the bed, interactive. HEENT: Anicteric, conjunctiva pink, lids and lashes normal. No nasal deformity, discharge, epistaxis.  Lips moist, dentition normal, no oral lesions, oropharynx moist, hearing normal.   Skin: Warm and dry.  No jaundice.  No suspicious rashes or lesions. Cardiac: Slow, irregular, nl S1-S2, no murmurs appreciated.  Capillary refill is brisk.  JVP not visible.  No LE edema.  Radia  pulses 2+ and symmetric. Respiratory: Normal respiratory rate and rhythm.  CTAB without rales or wheezes. Abdomen: Abdomen soft.  no TTP. No ascites, distension, hepatosplenomegaly.   MSK: No deformities or effusions. Neuro: Awake and alert.  EOMI, moves all extremities. Speech fluent.    Psych: Sensorium intact and responding  to questions, attention normal. Affect normal.  Judgment and insight appear normal.    Data Reviewed: I have personally reviewed following labs and imaging studies:  CBC: Recent Labs  Lab 06/26/18 0439 06/27/18 0534  WBC 10.0 9.3  NEUTROABS 5.7  --   HGB 9.1* 8.9*  HCT 31.3* 30.6*  MCV 82.8 83.2  PLT 289 268   Basic Metabolic Panel: Recent Labs   Lab 06/26/18 0439 06/27/18 0534  NA 140 138  K 4.6 4.8  CL 104 101  CO2 27 26  GLUCOSE 236* 296*  BUN 28* 37*  CREATININE 1.18* 1.38*  CALCIUM 9.1 8.9   GFR: Estimated Creatinine Clearance: 52.5 mL/min (A) (by C-G formula based on SCr of 1.38 mg/dL (H)). Liver Function Tests: No results for input(s): AST, ALT, ALKPHOS, BILITOT, PROT, ALBUMIN in the last 168 hours. No results for input(s): LIPASE, AMYLASE in the last 168 hours. No results for input(s): AMMONIA in the last 168 hours. Coagulation Profile: No results for input(s): INR, PROTIME in the last 168 hours. Cardiac Enzymes: Recent Labs  Lab 06/26/18 0439 06/26/18 0907  TROPONINI <0.03 <0.03   BNP (last 3 results) No results for input(s): PROBNP in the last 8760 hours. HbA1C: No results for input(s): HGBA1C in the last 72 hours. CBG: Recent Labs  Lab 06/26/18 1601 06/26/18 2144 06/27/18 0747  GLUCAP 279* 334* 286*   Lipid Profile: No results for input(s): CHOL, HDL, LDLCALC, TRIG, CHOLHDL, LDLDIRECT in the last 72 hours. Thyroid Function Tests: No results for input(s): TSH, T4TOTAL, FREET4, T3FREE, THYROIDAB in the last 72 hours. Anemia Panel: No results for input(s): VITAMINB12, FOLATE, FERRITIN, TIBC, IRON, RETICCTPCT in the last 72 hours. Urine analysis:    Component Value Date/Time   COLORURINE YELLOW 09/12/2017 1047   APPEARANCEUR Clear 02/25/2018 1050   LABSPEC 1.017 09/12/2017 1047   PHURINE 5.0 09/12/2017 1047   GLUCOSEU Negative 02/25/2018 1050   HGBUR SMALL (A) 09/12/2017 1047   BILIRUBINUR Negative 02/25/2018 1050   KETONESUR NEGATIVE 09/12/2017 1047   PROTEINUR 3+ (A) 02/25/2018 1050   PROTEINUR >=300 (A) 09/12/2017 1047   UROBILINOGEN negative 11/14/2015 0949   NITRITE Negative 02/25/2018 1050   NITRITE NEGATIVE 09/12/2017 1047   LEUKOCYTESUR Negative 02/25/2018 1050   Sepsis Labs: @LABRCNTIP (procalcitonin:4,lacticacidven:4)  )No results found for this or any previous visit (from  the past 240 hour(s)).       Radiology Studies: Dg Chest Port 1 View  Result Date: 06/26/2018 CLINICAL DATA:  Chest pain and shortness of breath. Similar pain 2 weeks ago with STEMI. History of asthma, diabetes, hypertension, breast lumpectomy EXAM: PORTABLE CHEST 1 VIEW COMPARISON:  06/05/2018 FINDINGS: Cardiac enlargement. No airspace disease or consolidation in the lungs. No blunting of costophrenic angles. No pneumothorax. Mediastinal contours appear intact. Calcification of the aorta. IMPRESSION: No active disease. Electronically Signed   By: Lucienne Capers M.D.   On: 06/26/2018 04:02        Scheduled Meds: . acyclovir  400 mg Oral Daily  . amLODipine  10 mg Oral Daily  . aspirin EC  81 mg Oral Daily  . chlorhexidine  60 mL Topical Once  . chlorhexidine  60 mL Topical Once  . clopidogrel  75 mg Oral Daily  . enoxaparin (LOVENOX) injection  40 mg Subcutaneous Q24H  . fluticasone  2 spray Each Nare Daily  . furosemide  20 mg Oral Daily  . gabapentin  100 mg Oral TID  . gentamicin irrigation  80 mg Irrigation On Call  .  hydrALAZINE  25 mg Oral BID  . Influenza vac split quadrivalent PF  0.5 mL Intramuscular Tomorrow-1000  . insulin aspart  0-20 Units Subcutaneous TID WC  . insulin aspart  0-5 Units Subcutaneous QHS  . insulin glargine  25 Units Subcutaneous BID  . isosorbide mononitrate  30 mg Oral Daily  . levothyroxine  50 mcg Oral QAC breakfast  . losartan  100 mg Oral Daily  . pantoprazole  40 mg Oral Daily  . sodium chloride flush  3 mL Intravenous Q12H  . sodium chloride flush  3 mL Intravenous Q12H   Continuous Infusions: . sodium chloride    . sodium chloride    . sodium chloride    .  ceFAZolin (ANCEF) IV       LOS: 1 day    Time spent: 25 minutes    Edwin Dada, MD [facility] 06/27/2018, 10:37 AM     Pager [phone] --- please page though AMION:  [email or web address] Password [email or web address] If 7PM-7AM, please contact  night-coverage

## 2018-06-27 NOTE — Clinical Social Work Note (Signed)
Clinical Social Work Assessment  Patient Details  Name: Mary Raymond MRN: [id number] Date of Birth: [birth year]  Date of referral:  06/27/18               Reason for consult:  Facility Placement, Discharge Planning                Permission sought to share information with:  Facility Sport and exercise psychologist, Family Supports Permission granted to share information::  Yes, Verbal Permission Granted  Name::     Mary Raymond  Agency::  [facility]  Relationship::  daughter  Contact Information:  [phone]  Housing/Transportation Living arrangements for the past 2 months:  [place], [place] of Information:  Patient, Adult Children Patient Interpreter Needed:  None Criminal Activity/Legal Involvement Pertinent to Current Situation/Hospitalization:  No - Comment as needed Significant Relationships:  Adult Children Lives with:  Self Do you feel safe going back to the place where you live?  Yes Need for family participation in patient care:  Yes (Comment)  Care giving concerns: Patient from [facility], where she was for rehab. Prior to that, patient from home. CSW familiar with patient from last admission 9/19 - 06/19/18.   Social Worker assessment / plan: CSW met with patient at bedside and re-introduced self and role and discussed disposition planning. Patient alert and oriented, though stated she would forget things, and requested CSW also update her daughter Mendel Ryder. Patient agreeable to return to [facility] to continue rehab. CSW left voicemail message for patient's daughter.  CSW spoke to [place] in admissions at [facility]. Facility can take patient back when new [facility] authorization is obtained. [facility] [place] will start the auth today, but will require PT/OT evaluations on patient. CSW requested therapy orders from MD. CSW to follow for medical readiness and will support with discharge back to the facility when insurance auth  received.  Employment status:    Insurance information:  Managed Medicare([employer]) PT Recommendations:  Not assessed at this time Information / Referral to community resources:  [place]  Patient/Family's Response to care: Patient appreciative of care.  Patient/Family's Understanding of and Emotional Response to Diagnosis, Current Treatment, and Prognosis: Patient with understanding of condition and agreeable to return to SNF.   Emotional Assessment Appearance:  Appears stated age Attitude/Demeanor/Rapport:  Engaged Affect (typically observed):  Accepting, Calm, Appropriate, Pleasant Orientation:  Oriented to Self, Oriented to Place, Oriented to  Time, Oriented to Situation(forgetful) Alcohol / Substance use:  Not Applicable Psych involvement (Current and /or in the community):  No (Comment)  Discharge Needs  Concerns to be addressed:  Discharge Planning Concerns, Care Coordination Readmission within the last 30 days:  Yes Current discharge risk:  Physical Impairment, Dependent with Mobility Barriers to Discharge:  Continued Medical Work up, [place], LCSW 06/27/2018, 11:51 AM

## 2018-06-28 ENCOUNTER — Inpatient Hospital Stay (HOSPITAL_COMMUNITY): Payer: Medicare HMO

## 2018-06-28 LAB — GLUCOSE, CAPILLARY
GLUCOSE-CAPILLARY: 197 mg/dL — AB (ref 70–99)
GLUCOSE-CAPILLARY: 209 mg/dL — AB (ref 70–99)
GLUCOSE-CAPILLARY: 255 mg/dL — AB (ref 70–99)
Glucose-Capillary: 124 mg/dL — ABNORMAL HIGH (ref 70–99)

## 2018-06-28 LAB — CBC
HCT: 31.5 % — ABNORMAL LOW (ref 36.0–46.0)
Hemoglobin: 9 g/dL — ABNORMAL LOW (ref 12.0–15.0)
MCH: 24 pg — AB (ref 26.0–34.0)
MCHC: 28.6 g/dL — ABNORMAL LOW (ref 30.0–36.0)
MCV: 84 fL (ref 80.0–100.0)
NRBC: 0 % (ref 0.0–0.2)
PLATELETS: 279 10*3/uL (ref 150–400)
RBC: 3.75 MIL/uL — AB (ref 3.87–5.11)
RDW: 15.6 % — AB (ref 11.5–15.5)
WBC: 9.7 10*3/uL (ref 4.0–10.5)

## 2018-06-28 LAB — BASIC METABOLIC PANEL
Anion gap: 8 (ref 5–15)
BUN: 27 mg/dL — AB (ref 8–23)
CALCIUM: 9.2 mg/dL (ref 8.9–10.3)
CO2: 27 mmol/L (ref 22–32)
Chloride: 104 mmol/L (ref 98–111)
Creatinine, Ser: 1.25 mg/dL — ABNORMAL HIGH (ref 0.44–1.00)
GFR calc Af Amer: 51 mL/min — ABNORMAL LOW (ref >60)
GFR calc non Af Amer: 44 mL/min — ABNORMAL LOW (ref >60)
GLUCOSE: 184 mg/dL — AB (ref 70–99)
POTASSIUM: 4.7 mmol/L (ref 3.5–5.1)
SODIUM: 139 mmol/L (ref 135–145)

## 2018-06-28 NOTE — Evaluation (Signed)
Occupational Therapy Evaluation Patient Details Name: Mary Raymond MRN: [id number] DOB: 22-Oct-1950 Today's Date: 06/28/2018    History of Present Illness 67 y.o. 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 Pt now s/p pacemaker implant on 10/11.    Clinical Impression   This 67 y/o female presents with the above. Pt was most recently in SNF receiving for ST rehab stay, reports she was using RW for mobility and receiving some assist for ADL completion. Pt presenting with generalized weakness, decreased activity tolerance and dynamic balance. Pt completing room level mobility with overall minA this session. Currently requires minguard assist for seated UB ADL, overall minA for LB and toileting ADLs. Pt reports plans to return to SNF for continued therapy services prior to return home. Feel this recommendation is appropriate to progress pt towards PLOF. Will continue to follow acutely to progress pt towards established OT goals.     Follow Up Recommendations  SNF;Supervision/Assistance - 24 hour    Equipment Recommendations  Other (comment)(TBD in next venue)           Precautions / Restrictions Precautions Precautions: Fall Precaution Comments: LUE limitations due to recent pacemaker placement  Restrictions Other Position/Activity Restrictions: limit pushing/pulling/lifting with LUE      Mobility Bed Mobility Overal bed mobility: Needs Assistance Bed Mobility: Supine to Sit     Supine to sit: Min guard     General bed mobility comments: minguard for safety and initial cues to limit use of LUE; exiting bed to the R; no physical assist required  Transfers Overall transfer level: Needs assistance Equipment used: 1 person hand held assist Transfers: Sit to/from Stand Sit to Stand: Min assist         General  transfer comment: assist to rise and steady in standing with VCs to use RUE only; stood from EOB and toilet    Balance Overall balance assessment: Needs assistance Sitting-balance support: Feet supported Sitting balance-Leahy Scale: Good     Standing balance support: Single extremity supported;No upper extremity supported;During functional activity Standing balance-Leahy Scale: Fair                             ADL either performed or assessed with clinical judgement   ADL Overall ADL's : Needs assistance/impaired Eating/Feeding: Independent;Sitting   Grooming: Minimal assistance;Standing   Upper Body Bathing: Min guard;Sitting   Lower Body Bathing: Minimal assistance;Sit to/from stand   Upper Body Dressing : Minimal assistance;Sitting Upper Body Dressing Details (indicate cue type and reason): to don gown secondary to LUE limitations Lower Body Dressing: Minimal assistance;Sit to/from stand   Toilet Transfer: Minimal assistance;Ambulation;Regular Toilet   Toileting- Clothing Manipulation and Hygiene: Minimal assistance;Sit to/from stand Toileting - Clothing Manipulation Details (indicate cue type and reason): for gown management; pt performing peri-care while seated on toilet     Functional mobility during ADLs: Minimal assistance(use of HHA)       Vision         Perception     Praxis      Pertinent Vitals/Pain Pain Assessment: Faces Faces Pain Scale: Hurts little more Pain Location: LUE, bil feet Pain Descriptors / Indicators: Sore;Aching Pain Intervention(s): Limited activity within patient's tolerance;Monitored during session;RN gave pain meds during session     Hand Dominance Right   Extremity/Trunk  Assessment Upper Extremity Assessment Upper Extremity Assessment: LUE deficits/detail LUE Deficits / Details: increased edema noted, decreased digit ROM; unable to fully assess due to current limitations LUE Coordination: decreased fine  motor;decreased gross motor   Lower Extremity Assessment Lower Extremity Assessment: Defer to PT evaluation       Communication Communication Communication: No difficulties   Cognition Arousal/Alertness: Awake/alert Behavior During Therapy: WFL for tasks assessed/performed Overall Cognitive Status: Impaired/Different from baseline Area of Impairment: Memory                     Memory: Decreased short-term memory         General Comments: pt noted with minimal confusion/decreased STM regarding recent events, though overall appearing WFL for functional tasks. pt initially requiring cues to recall and limit use of LUE but demonstrates increased carryover as session progressed   General Comments       Exercises     Shoulder Instructions      Home Living Family/patient expects to be discharged to:: Skilled nursing facility                                 Additional Comments: pt was mostly recently in SNF receiving therapy services, prior to this pt was living at home alone      Prior Functioning/Environment Level of Independence: Needs assistance  Gait / Transfers Assistance Needed: using RW for mobility at SNF, working with PT ADL's / Homemaking Assistance Needed: reports receiving some assist for ADLs at SNF, mostly completing sponge baths            OT Problem List: Decreased strength;Decreased range of motion;Decreased activity tolerance;Impaired balance (sitting and/or standing);Impaired UE functional use      OT Treatment/Interventions: Self-care/ADL training;Therapeutic exercise;DME and/or AE instruction;Therapeutic activities;Patient/family education;Balance training    OT Goals(Current goals can be found in the care plan section) Acute Rehab OT Goals Patient Stated Goal: to go back to rehab OT Goal Formulation: With patient Time For Goal Achievement: 07/12/18 Potential to Achieve Goals: Good  OT Frequency: Min 2X/week   Barriers to  D/C:            Co-evaluation              AM-PAC PT "6 Clicks" Daily Activity     Outcome Measure Help from another person eating meals?: None Help from another person taking care of personal grooming?: A Little Help from another person toileting, which includes using toliet, bedpan, or urinal?: A Little Help from another person bathing (including washing, rinsing, drying)?: A Little Help from another person to put on and taking off regular upper body clothing?: A Little Help from another person to put on and taking off regular lower body clothing?: A Lot 6 Click Score: 18   End of Session Equipment Utilized During Treatment: Gait belt Nurse Communication: Mobility status  Activity Tolerance: Patient tolerated treatment well Patient left: Other (comment)(handoff to PT for continued treatment session)  OT Visit Diagnosis: Unsteadiness on feet (R26.81);Muscle weakness (generalized) (M62.81)                Time: 6222-9798 OT Time Calculation (min): 20 min Charges:  OT General Charges $OT Visit: 1 Visit OT Evaluation $OT Eval Moderate Complexity: 1 Mod  Lou Cal, OT [employer] Pager [phone] Office [phone]   Raymondo Band 06/28/2018, 3:50 PM

## 2018-06-28 NOTE — Progress Notes (Signed)
Progress Note  Patient Name: Mary Raymond Date of Encounter: 06/28/2018  Primary Cardiologist: SM  Primary Electrophysiologist: [place]   Patient Profile     67 y.o. female  Admitted for high grade heart block Hx of NSTEMI prior DES Resident at [facility] and referred for cp; no risk stratification planned for that  Return anticipated but requires OT PT assessment  S/p Pacemaker yday JA  Subjective   Without complaint x pacemaker site soreness   Inpatient Medications    Scheduled Meds: . acyclovir  400 mg Oral Daily  . amLODipine  10 mg Oral Daily  . aspirin EC  81 mg Oral Daily  . clopidogrel  75 mg Oral Daily  . fluticasone  2 spray Each Nare Daily  . furosemide  20 mg Oral Daily  . gabapentin  100 mg Oral TID  . hydrALAZINE  25 mg Oral BID  . insulin aspart  0-20 Units Subcutaneous TID WC  . insulin aspart  0-5 Units Subcutaneous QHS  . insulin glargine  25 Units Subcutaneous BID  . isosorbide mononitrate  60 mg Oral Daily  . levothyroxine  50 mcg Oral QAC breakfast  . losartan  100 mg Oral Daily  . pantoprazole  40 mg Oral Daily  . sodium chloride flush  3 mL Intravenous Q12H   Continuous Infusions: . sodium chloride     PRN Meds: sodium chloride, [DISCONTINUED] acetaminophen **OR** acetaminophen, acetaminophen, ALPRAZolam, HYDROcodone-acetaminophen, ipratropium-albuterol, nitroGLYCERIN, ondansetron (ZOFRAN) IV, sodium chloride flush   Vital Signs    Vitals:   06/27/18 2109 06/27/18 2114 06/27/18 2300 06/28/18 0521  BP: (!) 150/60   (!) 173/61  Pulse:  69 68 64  Resp:  12 19 17   Temp:  97.9 F (36.6 C)  98.2 F (36.8 C)  TempSrc:  Oral  Oral  SpO2:  94% 93% 95%  Weight:    121.2 kg  Height:        Intake/Output Summary (Last 24 hours) at 06/28/2018 1058 Last data filed at 06/28/2018 0600 Gross per 24 hour  Intake 360 ml  Output 1800 ml  Net -1440 ml   Filed Weights   06/26/18 1300 06/27/18 0255 06/28/18 0521  Weight: 122.2 kg 121.7 kg  121.2 kg    Telemetry    P-synchronous/ AV  pacing  - Personally Reviewed  ECG    Sinus with P-synchronous/ AV  pacing  - Personally Reviewed  Physical Exam   GEN: No acute distress.   Neck: JVD flat Cardiac: RRR, 2/6 murmurs, rubs, or gallops.  Respiratory: Clear to auscultation bilaterally. GI: Soft, nontender, non-distended  Pocket without hematoma, swelling modest tenderness  MS:   edema; No deformity. Neuro:  Nonfocal  Psych: Normal affect  Skin Warm and dry   Labs    Chemistry Recent Labs  Lab 06/26/18 0439 06/27/18 0534 06/28/18 0445  NA 140 138 139  K 4.6 4.8 4.7  CL 104 101 104  CO2 27 26 27   GLUCOSE 236* 296* 184*  BUN 28* 37* 27*  CREATININE 1.18* 1.38* 1.25*  CALCIUM 9.1 8.9 9.2  GFRNONAA 47* 39* 44*  GFRAA 54* 45* 51*  ANIONGAP 9 11 8      Hematology Recent Labs  Lab 06/26/18 0439 06/27/18 0534 06/28/18 0445  WBC 10.0 9.3 9.7  RBC 3.78* 3.68* 3.75*  HGB 9.1* 8.9* 9.0*  HCT 31.3* 30.6* 31.5*  MCV 82.8 83.2 84.0  MCH 24.1* 24.2* 24.0*  MCHC 29.1* 29.1* 28.6*  RDW 15.7* 15.6* 15.6*  PLT 289 243 279    Cardiac Enzymes Recent Labs  Lab 06/26/18 0439 06/26/18 0907  TROPONINI <0.03 <0.03   No results for input(s): TROPIPOC in the last 168 hours.   BNPNo results for input(s): BNP, PROBNP in the last 168 hours.   DDimer No results for input(s): DDIMER in the last 168 hours.   Radiology    Dg Chest 2 View  Result Date: 06/28/2018 CLINICAL DATA:  Status post pacemaker placement EXAM: CHEST - 2 VIEW COMPARISON:  06/26/2018 FINDINGS: Cardiac shadow is stable. Pacemaker is now seen in satisfactory position. No pneumothorax is noted. The lungs are well aerated bilaterally. IMPRESSION: No pneumothorax following pacemaker placement Electronically Signed   By: Inez Catalina M.D.   On: 06/28/2018 09:54    Cardiac Studies   ECHO EF 9/19 >> 65%  Mod LVH   Device Interrogation    Normal device funciton    Assessment & Plan    High  Grade heart block  Pacemaker  Ischemic heart disease  Prior CABG\  Prior Stroke   S.p pacer  Need OT and PT assessments prior to consideration of transfer back to [place] and ambulate with assistance      Signed, Virl Axe, MD  06/28/2018, 10:58 AM

## 2018-06-28 NOTE — Progress Notes (Signed)
PROGRESS NOTE    Mary Raymond  XYI:[id number] DOB: [birth year] DOA: 06/26/2018 PCP: Mary Balloon, FNP      Brief Narrative:  Mary Raymond is a 67 y.o. 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  Cardiology at [facility] titrated Imdur for CP. 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 Status post pacer -Consult to EP, appreciate cares -No nodal blocking agents  Hypertension Still hypertensive -Continue amlodipine, losartan, furosemide -Continue Imdur -Hold hydralazine while titrating Imdur, but may restart tomorrow  Fibromyalgia -Continue gabapentin  Diabetes Glucose is near normal -Continue Lantus -Continue sliding scale  Hypothyroidism -Continue levothyroxine  Other medications -Continue acyclovir suppression -Continue pantoprazole -Continue alprazolam as needed -Hold Singulair  Chronic kidney disease stage III Creatinine stable  Normocytic anemia Hemoglobin no change     DVT prophylaxis: Lovenox Code Status: full Code Family Communication: None at the bedside MDM and disposition Plan: The below labs and imaging reports were reviewed and summarized above.  Medication management as above  Patient was admitted with chest pain, found to have high-grade AV block.  She was transferred to [facility] for pacemaker, and evaluated by EP who placed the pacemaker yesterday.  She is now awaiting transfer back to her skilled nursing facility.   Consultants:   Neurology  Elective physiology  Procedures:    None  Antimicrobials:   None   Subjective: A lot of pain in her incision site, no further chest pain, her energy is much better, she is no palpitations, orthopnea, leg swelling, fever, cough, sputum production.  Objective: Vitals:   06/27/18 2300 06/28/18 0521 06/28/18 0947 06/28/18 1417  BP:  (!) 173/61  (!) 143/61  Pulse: 68 64  64  Resp: 19 17 19    Temp:  98.2 F (36.8 C)  98.1 F (36.7 C)  TempSrc:  Oral  Oral  SpO2: 93% 95%  95%  Weight:  121.2 kg    Height:        Intake/Output Summary (Last 24 hours) at 06/28/2018 1507 Last data filed at 06/28/2018 1248 Gross per 24 hour  Intake 650 ml  Output 1800 ml  Net -1150 ml   Filed Weights   06/26/18 1300 06/27/18 0255 06/28/18 0521  Weight: 122.2 kg 121.7 kg 121.2 kg    Examination: General appearance: Obese adult female, lying in bed, no acute distress HEENT: Anicteric, conjunctiva pink, lids and lashes normal. No nasal deformity, discharge, epistaxis.  Lips moist, dentition normal, no oral lesions, oropharynx moist, hearing normal.   Skin: No hematoma at her incision site, otherwise skin is warm and dry. Cardiac: Slow regular, no murmurs, JVP normal, no lower extremity edema Respiratory: Normal respiratory rate and rhythm, lungs clear without rales or wheezes Abdomen: Abdomen soft without tenderness to palpation, ascites, distention. MSK: No deformities or effusions. Neuro: Awake and alert, extraocular movements intact, moves all extremities with normal strength and coordination, speech fluent.    Psych: Sensorium intact and responding to questions, attention normal, affect normal.  Judgment and insight appear normal.  Data Reviewed: I have personally reviewed following labs and imaging studies:  CBC: Recent Labs  Lab 06/26/18 0439 06/27/18 0534 06/28/18 0445  WBC 10.0 9.3 9.7  NEUTROABS 5.7  --   --   HGB 9.1* 8.9* 9.0*  HCT 31.3* 30.6* 31.5*  MCV 82.8 83.2 84.0  PLT 289 243 829   Basic  Metabolic Panel: Recent Labs  Lab 06/26/18 0439 06/27/18 0534 06/28/18 0445  NA 140 138 139  K 4.6 4.8 4.7  CL 104 101 104  CO2 27 26 27   GLUCOSE 236* 296* 184*  BUN 28* 37* 27*  CREATININE 1.18* 1.38* 1.25*  CALCIUM 9.1 8.9 9.2   GFR: Estimated Creatinine Clearance: 57.8 mL/min (A) (by C-G formula based on SCr of 1.25 mg/dL (H)). Liver Function Tests: No results for input(s): AST, ALT, ALKPHOS, BILITOT, PROT, ALBUMIN in the last 168 hours. No results for input(s): LIPASE, AMYLASE in the last 168 hours. No results for input(s): AMMONIA in the last 168 hours. Coagulation Profile: No results for input(s): INR, PROTIME in the last 168 hours. Cardiac Enzymes: Recent Labs  Lab 06/26/18 0439 06/26/18 0907  TROPONINI <0.03 <0.03   BNP (last 3 results) No results for input(s): PROBNP in the last 8760 hours. HbA1C: No results for input(s): HGBA1C in the last 72 hours. CBG: Recent Labs  Lab 06/27/18 1147 06/27/18 1836 06/27/18 2210 06/28/18 0732 06/28/18 1122  GLUCAP 221* 80 162* 124* 197*   Lipid Profile: No results for input(s): CHOL, HDL, LDLCALC, TRIG, CHOLHDL, LDLDIRECT in the last 72 hours. Thyroid Function Tests: No results for input(s): TSH, T4TOTAL, FREET4, T3FREE, THYROIDAB in the last 72 hours. Anemia Panel: No results for input(s): VITAMINB12, FOLATE, FERRITIN, TIBC, IRON, RETICCTPCT in the last 72 hours. Urine analysis:    Component Value Date/Time   COLORURINE YELLOW 09/12/2017 1047   APPEARANCEUR Clear 02/25/2018 1050   LABSPEC 1.017 09/12/2017 1047   PHURINE 5.0 09/12/2017 1047   GLUCOSEU Negative 02/25/2018 1050   HGBUR SMALL (A) 09/12/2017 1047   BILIRUBINUR Negative 02/25/2018 1050   KETONESUR NEGATIVE 09/12/2017 1047   PROTEINUR 3+ (A) 02/25/2018 1050   PROTEINUR >=300 (A) 09/12/2017 1047   UROBILINOGEN negative 11/14/2015 0949   NITRITE Negative 02/25/2018 1050   NITRITE NEGATIVE 09/12/2017 1047   LEUKOCYTESUR Negative 02/25/2018 1050    Sepsis Labs: @LABRCNTIP (procalcitonin:4,lacticacidven:4)  ) Recent Results (from the past 240 hour(s))  Surgical PCR screen     Status: None   Collection Time: 06/27/18 11:05 AM  Result Value Ref Range Status   MRSA, PCR NEGATIVE NEGATIVE Final   Staphylococcus aureus NEGATIVE NEGATIVE Final    Comment: (NOTE) The Xpert SA Assay (FDA approved for NASAL specimens in patients 30 years of age and older), is one component of a comprehensive surveillance program. It is not intended to diagnose infection nor to guide or monitor treatment. Performed at [facility], [place] [address], [place], [place] [address]          Radiology Studies: Dg Chest 2 View  Result Date: 06/28/2018 CLINICAL DATA:  Status post pacemaker placement EXAM: CHEST - 2 VIEW COMPARISON:  06/26/2018 FINDINGS: Cardiac shadow is stable. Pacemaker is now seen in satisfactory position. No pneumothorax is noted. The lungs are well aerated bilaterally. IMPRESSION: No pneumothorax following pacemaker placement Electronically Signed   By: Inez Catalina M.D.   On: 06/28/2018 09:54        Scheduled Meds: . acyclovir  400 mg Oral Daily  . amLODipine  10  mg Oral Daily  . aspirin EC  81 mg Oral Daily  . clopidogrel  75 mg Oral Daily  . fluticasone  2 spray Each Nare Daily  . furosemide  20 mg Oral Daily  . gabapentin  100 mg Oral TID  . hydrALAZINE  25 mg Oral BID  . insulin aspart  0-20 Units Subcutaneous TID WC  . insulin aspart  0-5 Units Subcutaneous QHS  . insulin glargine  25 Units Subcutaneous BID  . isosorbide mononitrate  60 mg Oral Daily  . levothyroxine  50 mcg Oral QAC breakfast  . losartan  100 mg Oral Daily  . pantoprazole  40 mg Oral Daily  . sodium chloride flush  3 mL Intravenous Q12H   Continuous Infusions: . sodium chloride       LOS: 2 days    Time spent: 25 minutes    Edwin Dada, MD [facility] 06/28/2018, 3:07 PM     Pager [phone] --- please page  though AMION:  [email or web address] If 7PM-7AM, please contact night-coverage

## 2018-06-28 NOTE — Evaluation (Signed)
Physical Therapy Evaluation Patient Details Name: Mary Raymond MRN: [id number] DOB: 1950-10-26 Today's Date: 06/28/2018   History of Present Illness  67 y.o. 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 Pt now s/p pacemaker implant on 10/11.   Clinical Impression  Pt admitted with above diagnosis. Pt currently with functional limitations due to the deficits listed below (see PT Problem List). Patient admitted from SNF, using walker for mobility. Currently presenting with decreased functional mobility secondary to decreased endurance, generalized weakness, and balance impairments. Performing transfers with min assist and room ambulation with min guard assist. Pt plans to return to SNF and feel this recommendation is appropriate to maximize functional independence. Pt will benefit from skilled PT to increase their independence and safety with mobility to allow discharge to the venue listed below.       Follow Up Recommendations SNF;Supervision for mobility/OOB    Equipment Recommendations  None recommended by PT    Recommendations for Other Services       Precautions / Restrictions Precautions Precautions: Fall;ICD/Pacemaker Precaution Comments: LUE limitations due to recent pacemaker placement  Restrictions Other Position/Activity Restrictions: limit pushing/pulling/lifting with LUE      Mobility  Bed Mobility Overal bed mobility: Needs Assistance Bed Mobility: Supine to Sit     Supine to sit: Min guard     General bed mobility comments: OOB with OT on arrival  Transfers Overall transfer level: Needs assistance Equipment used: 1 person hand held assist Transfers: Sit to/from Stand Sit to Stand: Min assist         General transfer comment: min assist to rise and use RUE only. decreased eccentric control to  sit  Ambulation/Gait Ambulation/Gait assistance: Min guard Gait Distance (Feet): 20 Feet Assistive device: 1 person hand held assist Gait Pattern/deviations: Step-through pattern;Decreased stride length;Trunk flexed Gait velocity: decreased  Gait velocity interpretation: <1.8 ft/sec, indicate of risk for recurrent falls General Gait Details: Min guard for safety; quickly fatigues  Stairs            Wheelchair Mobility    Modified Rankin (Stroke Patients Only)       Balance Overall balance assessment: Needs assistance Sitting-balance support: Feet supported Sitting balance-Leahy Scale: Good     Standing balance support: Single extremity supported;No upper extremity supported;During functional activity Standing balance-Leahy Scale: Fair                               Pertinent Vitals/Pain Pain Assessment: Faces Faces Pain Scale: Hurts little more Pain Location: LUE, bil feet Pain Descriptors / Indicators: Sore;Aching Pain Intervention(s): Monitored during session;Limited activity within patient's tolerance    Home Living Family/patient expects to be discharged to:: Skilled nursing facility                 Additional Comments: pt was mostly recently in SNF receiving therapy services, prior to this pt was living at home alone    Prior Function Level of Independence: Needs assistance   Gait / Transfers Assistance Needed: using RW for mobility at SNF, working with PT  ADL's / Homemaking Assistance Needed: reports receiving some assist for ADLs at SNF, mostly completing sponge baths        Hand Dominance   Dominant Hand: Right    Extremity/Trunk Assessment   Upper  Extremity Assessment Upper Extremity Assessment: Defer to OT evaluation LUE Deficits / Details: increased edema noted, decreased digit ROM; unable to fully assess due to current limitations LUE Coordination: decreased fine motor;decreased gross motor    Lower Extremity  Assessment Lower Extremity Assessment: Generalized weakness    Cervical / Trunk Assessment Cervical / Trunk Assessment: Other exceptions Cervical / Trunk Exceptions: Increased body habitus  Communication   Communication: No difficulties  Cognition Arousal/Alertness: Awake/alert Behavior During Therapy: WFL for tasks assessed/performed Overall Cognitive Status: Impaired/Different from baseline Area of Impairment: Memory                     Memory: Decreased short-term memory         General Comments: pt noted with minimal confusion/decreased STM regarding recent events, though overall appearing WFL for functional tasks. pt initially requiring cues to recall and limit use of LUE but demonstrates increased carryover as session progressed      General Comments      Exercises     Assessment/Plan    PT Assessment Patient needs continued PT services  PT Problem List Decreased strength;Decreased cognition;Decreased range of motion;Decreased knowledge of use of DME;Decreased activity tolerance;Decreased safety awareness;Decreased balance;Decreased mobility;Decreased coordination;Cardiopulmonary status limiting activity       PT Treatment Interventions DME instruction;Balance training;Gait training;Stair training;Cognitive remediation;Functional mobility training;Patient/family education;Therapeutic activities;Therapeutic exercise;Wheelchair mobility training    PT Goals (Current goals can be found in the Care Plan section)  Acute Rehab PT Goals Patient Stated Goal: to go back to rehab PT Goal Formulation: With patient Time For Goal Achievement: 07/12/18 Potential to Achieve Goals: Fair    Frequency Min 3X/week   Barriers to discharge        Co-evaluation               AM-PAC PT "6 Clicks" Daily Activity  Outcome Measure Difficulty turning over in bed (including adjusting bedclothes, sheets and blankets)?: A Lot Difficulty moving from lying on back to  sitting on the side of the bed? : A Lot Difficulty sitting down on and standing up from a chair with arms (e.g., wheelchair, bedside commode, etc,.)?: Unable Help needed moving to and from a bed to chair (including a wheelchair)?: A Little Help needed walking in hospital room?: A Little Help needed climbing 3-5 steps with a railing? : A Lot 6 Click Score: 13    End of Session Equipment Utilized During Treatment: Gait belt Activity Tolerance: Patient tolerated treatment well Patient left: in chair;with call bell/phone within reach Nurse Communication: Mobility status PT Visit Diagnosis: Unsteadiness on feet (R26.81);Other abnormalities of gait and mobility (R26.89);Muscle weakness (generalized) (M62.81);Other symptoms and signs involving the nervous system (R29.898)    Time: 1515-1530 PT Time Calculation (min) (ACUTE ONLY): 15 min   Charges:   PT Evaluation $PT Eval Moderate Complexity: 1 Mod        Ellamae Sia, [place], DPT Acute Rehabilitation Services Pager [phone] Office [phone]   Willy Eddy 06/28/2018, 4:09 PM

## 2018-06-28 NOTE — Progress Notes (Signed)
1330 Offered to walk with pt. She declined at this due to just having pain med and felt too groggy. Will let PT follow up . Graylon Good RN BSN 06/28/2018 1:35 PM

## 2018-06-28 NOTE — Plan of Care (Signed)
VSS. Cardiac Monitored. Left arm restrictions s/p PPM insertion. PT/OT consult.

## 2018-06-29 DIAGNOSIS — I82409 Acute embolism and thrombosis of unspecified deep veins of unspecified lower extremity: Secondary | ICD-10-CM

## 2018-06-29 DIAGNOSIS — Z959 Presence of cardiac and vascular implant and graft, unspecified: Secondary | ICD-10-CM

## 2018-06-29 DIAGNOSIS — I82622 Acute embolism and thrombosis of deep veins of left upper extremity: Secondary | ICD-10-CM

## 2018-06-29 LAB — GLUCOSE, CAPILLARY
GLUCOSE-CAPILLARY: 280 mg/dL — AB (ref 70–99)
GLUCOSE-CAPILLARY: 209 mg/dL — AB (ref 70–99)
GLUCOSE-CAPILLARY: 211 mg/dL — AB (ref 70–99)
Glucose-Capillary: 189 mg/dL — ABNORMAL HIGH (ref 70–99)

## 2018-06-29 MED ORDER — HYDRALAZINE HCL 25 MG PO TABS: 25.0000 mg | ORAL_TABLET | Freq: Three times a day (TID) | ORAL | Status: DC

## 2018-06-29 MED ORDER — INSULIN GLARGINE 100 UNIT/ML ~~LOC~~ SOLN
28.0000 [IU] | Freq: Two times a day (BID) | SUBCUTANEOUS | Status: DC
  Administered 2018-06-29 – 2018-07-01 (×4): 28 [IU] via SUBCUTANEOUS
  Filled 2018-06-29 (×5): qty 0.28

## 2018-06-29 MED ORDER — HYDRALAZINE HCL 25 MG PO TABS
37.5000 mg | ORAL_TABLET | Freq: Three times a day (TID) | ORAL | Status: DC
  Administered 2018-06-29 – 2018-07-01 (×6): 37.5 mg via ORAL
  Filled 2018-06-29 (×6): qty 2

## 2018-06-29 NOTE — Progress Notes (Signed)
Progress Note  Patient Name: Mary Raymond Date of Encounter: 06/29/2018  Primary Cardiologist: SM  Primary Electrophysiologist: [place]   Patient Profile     67 y.o. female  Admitted for high grade heart block Hx of NSTEMI prior DES Resident at [facility] and referred for cp; no risk stratification planned for that  Return anticipated but requires OT PT assessment  S/p Pacemaker yday JA  Subjective  Without chest pain or sob L UE swelling without pain   OOB chair for hours yesterday   Inpatient Medications    Scheduled Meds: . acyclovir  400 mg Oral Daily  . amLODipine  10 mg Oral Daily  . aspirin EC  81 mg Oral Daily  . clopidogrel  75 mg Oral Daily  . fluticasone  2 spray Each Nare Daily  . furosemide  20 mg Oral Daily  . gabapentin  100 mg Oral TID  . hydrALAZINE  25 mg Oral Q8H  . insulin aspart  0-20 Units Subcutaneous TID WC  . insulin aspart  0-5 Units Subcutaneous QHS  . insulin glargine  28 Units Subcutaneous BID  . isosorbide mononitrate  60 mg Oral Daily  . levothyroxine  50 mcg Oral QAC breakfast  . losartan  100 mg Oral Daily  . pantoprazole  40 mg Oral Daily  . sodium chloride flush  3 mL Intravenous Q12H   Continuous Infusions: . sodium chloride     PRN Meds: sodium chloride, [DISCONTINUED] acetaminophen **OR** acetaminophen, acetaminophen, ALPRAZolam, HYDROcodone-acetaminophen, ipratropium-albuterol, nitroGLYCERIN, ondansetron (ZOFRAN) IV, sodium chloride flush   Vital Signs    Vitals:   06/28/18 0947 06/28/18 1417 06/28/18 2154 06/29/18 0601  BP:  (!) 143/61 (!) 159/61 (!) 165/57  Pulse:  64 70 73  Resp: 19  19 19   Temp:  98.1 F (36.7 C) 98 F (36.7 C) 97.9 F (36.6 C)  TempSrc:  Oral Oral Oral  SpO2:  95% 94% 91%  Weight:    121.6 kg  Height:        Intake/Output Summary (Last 24 hours) at 06/29/2018 1140 Last data filed at 06/29/2018 1121 Gross per 24 hour  Intake 530 ml  Output 1200 ml  Net -670 ml   Filed Weights   06/27/18 0255 06/28/18 0521 06/29/18 0601  Weight: 121.7 kg 121.2 kg 121.6 kg    Telemetry    Sinus w P-synchronous/ AV  pacing   - Personally Reviewed  ECG    Sinus with P-synchronous/ AV  pacing  - Personally Reviewed  Physical Exam  Well developed and nourished in no acute distress HENT normal Neck supple with JVP-flat Clear Regular rate and rhythm, no murmurs or gallops Abd-soft with active BS No Clubbing cyanosis LUE swelling   Skin-warm and dry A & Oriented  Grossly normal sensory and motor function    Labs    Chemistry Recent Labs  Lab 06/26/18 0439 06/27/18 0534 06/28/18 0445  NA 140 138 139  K 4.6 4.8 4.7  CL 104 101 104  CO2 27 26 27   GLUCOSE 236* 296* 184*  BUN 28* 37* 27*  CREATININE 1.18* 1.38* 1.25*  CALCIUM 9.1 8.9 9.2  GFRNONAA 47* 39* 44*  GFRAA 54* 45* 51*  ANIONGAP 9 11 8      Hematology Recent Labs  Lab 06/26/18 0439 06/27/18 0534 06/28/18 0445  WBC 10.0 9.3 9.7  RBC 3.78* 3.68* 3.75*  HGB 9.1* 8.9* 9.0*  HCT 31.3* 30.6* 31.5*  MCV 82.8 83.2 84.0  MCH 24.1* 24.2*  24.0*  MCHC 29.1* 29.1* 28.6*  RDW 15.7* 15.6* 15.6*  PLT 289 243 279    Cardiac Enzymes Recent Labs  Lab 06/26/18 0439 06/26/18 0907  TROPONINI <0.03 <0.03   No results for input(s): TROPIPOC in the last 168 hours.   BNPNo results for input(s): BNP, PROBNP in the last 168 hours.   DDimer No results for input(s): DDIMER in the last 168 hours.   Radiology    Dg Chest 2 View  Result Date: 06/28/2018 CLINICAL DATA:  Status post pacemaker placement EXAM: CHEST - 2 VIEW COMPARISON:  06/26/2018 FINDINGS: Cardiac shadow is stable. Pacemaker is now seen in satisfactory position. No pneumothorax is noted. The lungs are well aerated bilaterally. IMPRESSION: No pneumothorax following pacemaker placement Electronically Signed   By: Inez Catalina M.D.   On: 06/28/2018 09:54    Cardiac Studies   ECHO EF 9/19 >> 65%  Mod LVH   Device Interrogation    Normal device  funciton    Assessment & Plan    High Grade heart block  Pacemaker newly implanted   Ischemic heart disease  Prior CABG\  Prior Stroke  BP elevated   Arm swelling likely  LUE DVT  Need OT PT eval  OOB chair     Increase hydralazine 25>>37.5 q 8  Will begin NOAC tomorrow ( 72 hrs post pacing) and will need to decide with Dr Shirlee More as to whether to continue with DAPT or a single agent -- based on AUGUSTUS- would not use coumadin, and now with 3 weeks of DaPT would favor clopidgrel alone with apixoban     Signed, Virl Axe, MD  06/29/2018, 11:40 AM

## 2018-06-29 NOTE — Progress Notes (Signed)
PROGRESS NOTE    Mary Raymond  FKC:[id number] DOB: 1950-11-15 DOA: 06/26/2018 [name], FNP      Brief Narrative:  Mary Raymond is a 67 y.o. 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  Cardiology at [facility] titrated Imdur for CP. 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 Status post pacer -Consult to EP, appreciate cares  Hypertension Still hypertensive -Continue amlodipine, losartan, furosemide, Imdur -Increase hydralazine  Fibromyalgia -Continue gabapentin  Diabetes Hyperglycemic -Continue Lantus, increase dose -Continue sliding scale   Hypothyroidism -Continue levothyroxine  Other medications -Continue acyclovir suppression -Continue pantoprazole -Continue alprazolam as needed -Hold Singulair  Chronic kidney disease stage III Cr stable  Normocytic anemia Hgb no change     DVT prophylaxis: Lovenox Code Status: full Code Family Communication: None at the bedside MDM and disposition Plan: The below labs and imaging reports were reviewed and summarized above.  Medication management as above.  The patient was admitted with chest pain, found to have high-grade AV block.  She was transferred to count with pacemaker, and pacemaker was placed 2 days ago.  She is now awaiting insurance authorization for safe discharge back to her skilled nursing facility.    Consultants:   Neurology  Elective physiology  Procedures:   None  Antimicrobials:    None   Subjective: Pain in her incision site is considerably improved, no new chest pain, no exertional symptoms, no palpitations, orthopnea, dyspnea, leg swelling, fever, sputum.  Objective: Vitals:   06/28/18 0947 06/28/18 1417 06/28/18 2154 06/29/18 0601  BP:  (!) 143/61 (!) 159/61 (!) 165/57  Pulse:  64 70 73  Resp: 19  19 19   Temp:  98.1 F (36.7 C) 98 F (36.7 C) 97.9 F (36.6 C)  TempSrc:  Oral Oral Oral  SpO2:  95% 94% 91%  Weight:    121.6 kg  Height:        Intake/Output Summary (Last 24 hours) at 06/29/2018 1119 Last data filed at 06/29/2018 0641 Gross per 24 hour  Intake 530 ml  Output -  Net 530 ml   Filed Weights   06/27/18 0255 06/28/18 0521 06/29/18 0601  Weight: 121.7 kg 121.2 kg 121.6 kg    Examination: General appearance: Obese adult female, sitting up in bed, no acute distress. HEENT: Anicteric, conjunctiva pink, lids and lashes normal. No nasal deformity, discharge, epistaxis.  Lips moist, dentition normal, no oral lesions, oropharynx moist, hearing normal.   Skin: No hematoma at her incision site, otherwise skin is warm and dry.   Cardiac: RRR, JVP not visible, no lower extremity edema, no murmurs. Respiratory: Normal respiratory rate and rhythm, lungs clear without rales or wheezes. Abdomen: Soft without tenderness to palpation, ascites, distention. MSK: No deformities or effusions. Neuro: Awake and alert, extraocular movements intact, moves all extremities with normal strength coordination, speech fluent.    Psych: Sensorium intact and responding to questions, attention normal, affect normal, judgment insight normal.     Data Reviewed: I have personally reviewed  following labs and imaging studies:  CBC: Recent Labs  Lab 06/26/18 0439 06/27/18 0534 06/28/18 0445  WBC 10.0 9.3 9.7  NEUTROABS 5.7  --   --   HGB 9.1* 8.9* 9.0*  HCT 31.3* 30.6* 31.5*  MCV 82.8 83.2 84.0  PLT 289 243 297   Basic Metabolic Panel: Recent Labs  Lab  06/26/18 0439 06/27/18 0534 06/28/18 0445  NA 140 138 139  K 4.6 4.8 4.7  CL 104 101 104  CO2 27 26 27   GLUCOSE 236* 296* 184*  BUN 28* 37* 27*  CREATININE 1.18* 1.38* 1.25*  CALCIUM 9.1 8.9 9.2   GFR: Estimated Creatinine Clearance: 57.9 mL/min (A) (by C-G formula based on SCr of 1.25 mg/dL (H)). Liver Function Tests: No results for input(s): AST, ALT, ALKPHOS, BILITOT, PROT, ALBUMIN in the last 168 hours. No results for input(s): LIPASE, AMYLASE in the last 168 hours. No results for input(s): AMMONIA in the last 168 hours. Coagulation Profile: No results for input(s): INR, PROTIME in the last 168 hours. Cardiac Enzymes: Recent Labs  Lab 06/26/18 0439 06/26/18 0907  TROPONINI <0.03 <0.03   BNP (last 3 results) No results for input(s): PROBNP in the last 8760 hours. HbA1C: No results for input(s): HGBA1C in the last 72 hours. CBG: Recent Labs  Lab 06/28/18 0732 06/28/18 1122 06/28/18 1621 06/28/18 2152 06/29/18 0757  GLUCAP 124* 197* 209* 255* 189*   Lipid Profile: No results for input(s): CHOL, HDL, LDLCALC, TRIG, CHOLHDL, LDLDIRECT in the last 72 hours. Thyroid Function Tests: No results for input(s): TSH, T4TOTAL, FREET4, T3FREE, THYROIDAB in the last 72 hours. Anemia Panel: No results for input(s): VITAMINB12, FOLATE, FERRITIN, TIBC, IRON, RETICCTPCT in the last 72 hours. Urine analysis:    Component Value Date/Time   COLORURINE YELLOW 09/12/2017 1047   APPEARANCEUR Clear 02/25/2018 1050   LABSPEC 1.017 09/12/2017 1047   PHURINE 5.0 09/12/2017 1047   GLUCOSEU Negative 02/25/2018 1050   HGBUR SMALL (A) 09/12/2017 1047   BILIRUBINUR Negative 02/25/2018 1050   KETONESUR NEGATIVE 09/12/2017 1047   PROTEINUR 3+ (A) 02/25/2018 1050   PROTEINUR >=300 (A) 09/12/2017 1047   UROBILINOGEN negative 11/14/2015 0949   NITRITE Negative 02/25/2018 1050   NITRITE NEGATIVE 09/12/2017 1047   LEUKOCYTESUR Negative 02/25/2018 1050   Sepsis  Labs: @LABRCNTIP (procalcitonin:4,lacticacidven:4)  ) Recent Results (from the past 240 hour(s))  Surgical PCR screen     Status: None   Collection Time: 06/27/18 11:05 AM  Result Value Ref Range Status   MRSA, PCR NEGATIVE NEGATIVE Final   Staphylococcus aureus NEGATIVE NEGATIVE Final    Comment: (NOTE) The Xpert SA Assay (FDA approved for NASAL specimens in patients 26 years of age and older), is one component of a comprehensive surveillance program. It is not intended to diagnose infection nor to guide or monitor treatment. Performed at [facility], [place] [address], [place], [place] [address]          Radiology Studies: Dg Chest 2 View  Result Date: 06/28/2018 CLINICAL DATA:  Status post pacemaker placement EXAM: CHEST - 2 VIEW COMPARISON:  06/26/2018 FINDINGS: Cardiac shadow is stable. Pacemaker is now seen in satisfactory position. No pneumothorax is noted. The lungs are well aerated bilaterally. IMPRESSION: No pneumothorax following pacemaker placement Electronically Signed   By: Inez Catalina M.D.   On: 06/28/2018 09:54        Scheduled Meds: . acyclovir  400 mg Oral Daily  . amLODipine  10 mg Oral Daily  . aspirin  EC  81 mg Oral Daily  . clopidogrel  75 mg Oral Daily  . fluticasone  2 spray Each Nare Daily  . furosemide  20 mg Oral Daily  . gabapentin  100 mg Oral TID  . hydrALAZINE  25 mg Oral BID  . insulin aspart  0-20 Units Subcutaneous TID WC  . insulin aspart  0-5 Units Subcutaneous QHS  . insulin glargine  25 Units Subcutaneous BID  . isosorbide mononitrate  60 mg Oral Daily  . levothyroxine  50 mcg Oral QAC breakfast  . losartan  100 mg Oral Daily  . pantoprazole  40 mg Oral Daily  . sodium chloride flush  3 mL Intravenous Q12H   Continuous Infusions: . sodium chloride       LOS: 3 days    Time spent: 53minutes    Edwin Dada, MD [facility] 06/29/2018, 11:19 AM     Pager [phone] --- please page though  AMION:  [email or web address] Password [email or web address] If 7PM-7AM, please contact night-coverage

## 2018-06-29 NOTE — Progress Notes (Signed)
SW sent PT & OT notes to [facility] for review.  Madilyn Fireman, MSW Medical Social Worker [place]

## 2018-06-30 DIAGNOSIS — I259 Chronic ischemic heart disease, unspecified: Secondary | ICD-10-CM

## 2018-06-30 LAB — GLUCOSE, CAPILLARY
GLUCOSE-CAPILLARY: 159 mg/dL — AB (ref 70–99)
GLUCOSE-CAPILLARY: 224 mg/dL — AB (ref 70–99)
GLUCOSE-CAPILLARY: 195 mg/dL — AB (ref 70–99)
Glucose-Capillary: 260 mg/dL — ABNORMAL HIGH (ref 70–99)

## 2018-06-30 NOTE — Progress Notes (Signed)
CARDIAC REHAB PHASE I   PRE:  Rate/Rhythm: 60 SR some paced beats     MODE:  Ambulation: 140 ft   POST:  Rate/Rhythm: 101 ST   BP:  Supine:   Sitting: 161/44  Standing:    SaO2: 97%RA 9355-2174 Waited for pt to eat breakfast before we walked. Pt assisted to bathroom and then walked 140 ft with rolling walker and asst x 2. Gait steady. Asked to turn recliner a different way so pt could see out the window. Pt encouraged to call when she needs to get OOB. Pt steadier this admission than when we walked last admission. Can be asst x 1. Call bell in reach.   Graylon Good, RN BSN  06/30/2018 9:17 AM

## 2018-06-30 NOTE — NC FL2 (Signed)
[place] LEVEL OF CARE SCREENING TOOL     IDENTIFICATION  Patient Name: Mary Raymond Birthdate: Feb 16, 1951 Sex: female Admission Date (Current Location): 06/26/2018  [facility] and [place] Number:  Herbalist and Address:  [facility] [place], [place] [address] [place], [place] [address]      Provider Number: [id number]  Attending Physician Name and Address:  Edwin Dada, *  Relative Name and Phone Number:  Mary Raymond, daughter, [phone]    Current Level of Care: Hospital Recommended Level of Care: [place] Prior Approval Number:    Date Approved/Denied:   PASRR Number: [id number]  Discharge Plan: SNF    Current Diagnoses: Patient Active Problem List   Diagnosis Date Noted  . Deep venous thrombosis ([place])   . Cardiac device in situ   . Cerebral embolism with cerebral infarction 06/09/2018  . NSTEMI (non-ST elevated myocardial infarction) ([place]), 05/2018   . Chest pain 06/05/2018  . Elevated troponin 06/05/2018  . Noncompliance 11/05/2017  . Genital herpes 08/02/2016  . Pain medication agreement signed 05/01/2016  . Uncomplicated opioid dependence ([place]) 05/01/2016  . Chronic back pain 08/10/2015  . 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 Depression 11/15/2014  . GERD (gastroesophageal reflux disease) 11/15/2014  . Hypertension associated with diabetes ([place])   . Hyperlipidemia associated with type 2 diabetes mellitus ([place])   . Diabetes mellitus type 2, uncontrolled, without complications ([place])   . Anxiety   . Obesity, morbid, BMI 40.0-49.9 ([place])   . Allergy   . Asthma   . Vitamin D deficiency   . Neuropathy   . Hypothyroidism 02/25/2013    Orientation RESPIRATION BLADDER Height & Weight     Self, Time, Situation, Place  Normal Continent Weight: 263 lb 8 oz (119.5 kg) Height:  5\' 5"  (165.1 cm)  BEHAVIORAL SYMPTOMS/MOOD NEUROLOGICAL BOWEL NUTRITION STATUS      Continent Diet(please see DC summary)  AMBULATORY STATUS COMMUNICATION OF NEEDS Skin   Limited Assist Verbally Normal                       Personal Care Assistance Level of Assistance  Bathing, Feeding, Dressing Bathing Assistance: Limited assistance Feeding assistance: Independent Dressing Assistance: Limited assistance     Functional Limitations Info  Sight, Hearing, Speech Sight Info: Impaired Hearing Info: Adequate Speech Info: Adequate    SPECIAL CARE FACTORS FREQUENCY  PT (By licensed PT), OT (By licensed OT)     PT Frequency: 5x/week OT Frequency: 5x/week            Contractures Contractures Info: Not present    Additional Factors Info  Code Status, Allergies, Insulin Sliding Scale Code Status Info: Full Allergies Info: Atorvastatin   Insulin Sliding Scale Info: novolog 3x/day with meals and at bedtime, lantus 2x/day       Current Medications (06/30/2018):  This is the current hospital active medication list Current Facility-Administered Medications  Medication Dose Route Frequency Provider Last Rate Last Dose  . 0.9 %  sodium chloride infusion  250 mL Intravenous PRN Allred, Jeneen Rinks, MD      . acetaminophen (TYLENOL) suppository 650 mg  650 mg Rectal Q6H PRN Allred, Jeneen Rinks, MD      . acetaminophen (TYLENOL) tablet 325-650 mg  325-650 mg Oral Q4H PRN [name], MD   650 mg at 06/27/18 1839  . acyclovir (ZOVIRAX) tablet 400 mg  400 mg Oral Daily [name], MD   400  mg at 06/29/18 0849  . ALPRAZolam Duanne Moron) tablet 0.5 mg  0.5 mg Oral BID PRN [name], MD   0.5 mg at 06/29/18 2134  . amLODipine (NORVASC) tablet 10 mg  10 mg Oral Daily Allred, Jeneen Rinks, MD   10 mg at 06/29/18 0850  . aspirin EC tablet 81 mg  81 mg Oral Daily [name], MD   81 mg at 06/29/18 0850  . clopidogrel (PLAVIX) tablet 75 mg  75 mg Oral Daily [name], MD   75 mg at 06/29/18 0850  . fluticasone (FLONASE) 50 MCG/ACT nasal spray 2 spray  2 spray Each Nare Daily  Allred, James, MD      . furosemide (LASIX) tablet 20 mg  20 mg Oral Daily Allred, Jeneen Rinks, MD   20 mg at 06/29/18 0849  . gabapentin (NEURONTIN) capsule 100 mg  100 mg Oral TID [name], MD   100 mg at 06/29/18 2134  . hydrALAZINE (APRESOLINE) tablet 37.5 mg  37.5 mg Oral Q8H Deboraha Sprang, MD   37.5 mg at 06/30/18 0536  . HYDROcodone-acetaminophen (NORCO/VICODIN) 5-325 MG per tablet 1-2 tablet  1-2 tablet Oral Q4H PRN [name], MD   2 tablet at 06/29/18 2134  . insulin aspart (novoLOG) injection 0-20 Units  0-20 Units Subcutaneous TID WC Allred, Jeneen Rinks, MD   4 Units at 06/30/18 0840  . insulin aspart (novoLOG) injection 0-5 Units  0-5 Units Subcutaneous QHS [name], MD   2 Units at 06/29/18 2137  . insulin glargine (LANTUS) injection 28 Units  28 Units Subcutaneous BID Edwin Dada, MD   28 Units at 06/29/18 2136  . ipratropium-albuterol (DUONEB) 0.5-2.5 (3) MG/3ML nebulizer solution 3 mL  3 mL Nebulization Q6H PRN Allred, Jeneen Rinks, MD   3 mL at 06/26/18 1659  . isosorbide mononitrate (IMDUR) 24 hr tablet 60 mg  60 mg Oral Daily Allred, Jeneen Rinks, MD   60 mg at 06/29/18 0848  . levothyroxine (SYNTHROID, LEVOTHROID) tablet 50 mcg  50 mcg Oral QAC breakfast [name], MD   50 mcg at 06/30/18 0839  . losartan (COZAAR) tablet 100 mg  100 mg Oral Daily Allred, Jeneen Rinks, MD   100 mg at 06/29/18 0851  . nitroGLYCERIN (NITROSTAT) SL tablet 0.4 mg  0.4 mg Sublingual Q5 min PRN Allred, Jeneen Rinks, MD   0.4 mg at 06/26/18 0518  . ondansetron (ZOFRAN) injection 4 mg  4 mg Intravenous Q6H PRN Allred, James, MD      . pantoprazole (PROTONIX) EC tablet 40 mg  40 mg Oral Daily Allred, Jeneen Rinks, MD   40 mg at 06/29/18 0848  . sodium chloride flush (NS) 0.9 % injection 3 mL  3 mL Intravenous Q12H [name], MD   3 mL at 06/29/18 2138  . sodium chloride flush (NS) 0.9 % injection 3 mL  3 mL Intravenous PRN [name], MD         Discharge Medications: Please see discharge summary for a list of  discharge medications.  Relevant Imaging Results:  Relevant Lab Results:   Additional Information SSN: [id number]  Mary Emms, LCSW

## 2018-06-30 NOTE — Consult Note (Addendum)
   [facility] CM Inpatient Consult   06/30/2018  Mary Raymond 1951-03-25 [id number]     Patient screened for potential [facility] Care Management services due to unplanned readmission risk score of 28% (high).  Chart reviewed. Noted discharge plan is for SNF. Discussed with inpatient RNCM.  No identifiable [facility] Care Management needs at this time.   Marthenia Rolling, MSN-Ed, RN,BSN [facility] Liaison [phone]

## 2018-06-30 NOTE — Progress Notes (Signed)
Physical Therapy Treatment Patient Details Name: Mary Raymond MRN: [id number] DOB: 07-21-1951 Today's Date: 06/30/2018    History of Present Illness 67 y.o. 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 Pt now s/p pacemaker implant on 10/11.     PT Comments    Patient progressing well towards PT goals. Continues to require cues for pacemaker precautions during functional tasks. Improved ambulation distance today but limited by fatigue and pain. Highly motivated to return to functional independence. Demonstrates impaired memory and does require cues to adhere to minimizing use of LUE during session. Continue to recommend SNF. Will follow.    Follow Up Recommendations  SNF;Supervision for mobility/OOB     Equipment Recommendations  None recommended by PT    Recommendations for Other Services       Precautions / Restrictions Precautions Precautions: Fall;ICD/Pacemaker Precaution Comments: LUE limitations due to recent pacemaker placement  Restrictions Weight Bearing Restrictions: No    Mobility  Bed Mobility Overal bed mobility: Needs Assistance Bed Mobility: Rolling;Sidelying to Sit;Sit to Sidelying Rolling: Modified independent (Device/Increase time) Sidelying to sit: Min guard;HOB elevated     Sit to sidelying: Min guard;HOB elevated General bed mobility comments: Increased time to get to EOB using rail for support.  Transfers Overall transfer level: Needs assistance Equipment used: None Transfers: Sit to/from Stand Sit to Stand: Mod assist         General transfer comment: Assist to power from low toilet as pt trying to pull up with LUE; MIn guard from EOB.  Ambulation/Gait Ambulation/Gait assistance: Min guard Gait Distance (Feet): 120 Feet Assistive device: Rolling walker (2 wheeled) Gait  Pattern/deviations: Step-through pattern;Decreased stride length;Trunk flexed Gait velocity: decreased    General Gait Details: Slow, mostly steady gait but limited by fatigue and pain through LUE and back.   Stairs             Wheelchair Mobility    Modified Rankin (Stroke Patients Only) Modified Rankin (Stroke Patients Only) Pre-Morbid Rankin Score: Moderate disability Modified Rankin: Moderately severe disability     Balance Overall balance assessment: Needs assistance Sitting-balance support: Feet supported;No upper extremity supported Sitting balance-Leahy Scale: Good     Standing balance support: During functional activity;Single extremity supported Standing balance-Leahy Scale: Fair Standing balance comment: reliant on RW for gait but able to wash hands at sink without UE support.                            Cognition Arousal/Alertness: Awake/alert Behavior During Therapy: WFL for tasks assessed/performed Overall Cognitive Status: Impaired/Different from baseline Area of Impairment: Memory                     Memory: Decreased short-term memory         General Comments: pt noted with minimal confusion/decreased STM regarding recent events, though overall appearing WFL for functional tasks. Needs reminders to not use LUE for functional tasks      Exercises      General Comments        Pertinent Vitals/Pain Pain Assessment: Faces Faces Pain Scale: Hurts even more Pain Location: LUE, back- chronic Pain Descriptors / Indicators: Sore;Aching Pain Intervention(s): Monitored during session;Repositioned;Limited activity within patient's tolerance    Home Living  Prior Function            PT Goals (current goals can now be found in the care plan section) Progress towards PT goals: Progressing toward goals    Frequency    Min 3X/week      PT Plan Current plan remains appropriate     Co-evaluation              AM-PAC PT "6 Clicks" Daily Activity  Outcome Measure  Difficulty turning over in bed (including adjusting bedclothes, sheets and blankets)?: A Little Difficulty moving from lying on back to sitting on the side of the bed? : Unable Difficulty sitting down on and standing up from a chair with arms (e.g., wheelchair, bedside commode, etc,.)?: Unable Help needed moving to and from a bed to chair (including a wheelchair)?: A Little Help needed walking in hospital room?: A Little Help needed climbing 3-5 steps with a railing? : A Lot 6 Click Score: 13    End of Session Equipment Utilized During Treatment: Gait belt Activity Tolerance: Patient limited by fatigue;Patient limited by pain Patient left: in bed;with call bell/phone within reach Nurse Communication: Mobility status PT Visit Diagnosis: Unsteadiness on feet (R26.81);Other abnormalities of gait and mobility (R26.89);Muscle weakness (generalized) (M62.81);Other symptoms and signs involving the nervous system (R29.898)     Time: 0174-9449 PT Time Calculation (min) (ACUTE ONLY): 20 min  Charges:  $Therapeutic Activity: 8-22 mins                     Wray Kearns, PT, DPT Acute Rehabilitation Services Pager [phone] Office [phone]    2   [place] 06/30/2018, 12:09 PM

## 2018-06-30 NOTE — Progress Notes (Signed)
PROGRESS NOTE    Mary Raymond  UTM:[id number] DOB: 04-07-1951 DOA: 06/26/2018 [name], FNP      Brief Narrative:  Mrs. Areola is a 67 y.o. 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  They will follow up in clinic.  Hypertension BP improved -Continue amlodipine, Losartan, furosemide, Imdur, new dose hydralazine     Fibromyalgia -Continue gabapentin  Diabetes Hypergylemic -Continue Lantus -Continue SSI   Hypothyroidism -Continue levothyroxine  Other medications -Continue acyclovir suppression -Continue pantoprazole -Continue alprazolam as needed -Hold Singulair  Chronic kidney disease stage III Cr stable  Normocytic anemia Hgb stable     DVT prophylaxis: Lovenox Code Status: full Code Family Communication: None at the bedside MDM and disposition Plan: The below labs and imaging reports were reviewed and summarized above.  Medication management as above.  The patient was admitted with chest pain, found to have high-grade AV block.  She was transferred to our hospital for pacemaker which was placed during his hospitalization.  She continues to remain significantly debilitated, unable to transfer without assistance or walking a few steps without help.  She will require skilled nursing rehab for safe discharge.   Consultants:   Cardiology  Elective physiology  Procedures:   None  Antimicrobials:   None   Subjective: Left arm swelling improved.   No further chest pain.  Her exercise tolerance is improved from prior to admission, but she still requires assistance for transfers and ambulating short distances.  No leg swelling, no orthopnea, no fever.  Objective: Vitals:   06/29/18 1959 06/30/18 0533 06/30/18 1050 06/30/18 1404  BP: (!) 175/68 (!) 171/75 (!) 156/57 (!) 151/57  Pulse: 70 71  89  Resp: 18 16  (!) 22  Temp: 98.1 F (36.7 C) 98 F (36.7 C)  98 F (36.7 C)  TempSrc: Oral Oral  Oral  SpO2: 93% 96%  95%  Weight:  119.5 kg    Height:        Intake/Output Summary (Last 24 hours) at 06/30/2018 1637 Last data filed at 06/30/2018 0650 Gross per 24 hour  Intake 480 ml  Output -  Net 480 ml   Filed Weights   06/28/18 0521 06/29/18 0601 06/30/18 0533  Weight: 121.2 kg 121.6 kg 119.5 kg    Examination: General appearance: This adult female, sitting up in bed, no acute distress HEENT: Anicteric, conjunctiva pink, lids and lashes normal. No nasal deformity, discharge, epistaxis.  Lips moist, dentition normal, no oral lesions, oropharynx moist, hearing normal.   Skin: No hematoma at her incision site, otherwise skin is warm and dry.   Cardiac: Rate and rhythm, JVP not visible, no lower extremity edema Respiratory: Normal respiratory rate and rhythm, lungs clear without rales or wheezes Abdomen: Abdomen soft without tenderness palpation, ascites, distention, or hepatospleno megaly MSK: Arm swelling is improved.  No deformities or effusions of the large joints of the upper or lower extremities bilaterally Neuro: Awake and alert, EOMI, moves all extremities with normal  strength and coordination, speech fluent. Psych: Sodium intact and responding to questions, attention normal, affect blunted, judgment and insight appear normal.    Data Reviewed: I have personally reviewed following labs and imaging studies:  CBC: Recent Labs  Lab 06/26/18 0439 06/27/18 0534 06/28/18 0445  WBC 10.0 9.3 9.7  NEUTROABS 5.7  --   --     HGB 9.1* 8.9* 9.0*  HCT 31.3* 30.6* 31.5*  MCV 82.8 83.2 84.0  PLT 289 243 086   Basic Metabolic Panel: Recent Labs  Lab 06/26/18 0439 06/27/18 0534 06/28/18 0445  NA 140 138 139  K 4.6 4.8 4.7  CL 104 101 104  CO2 27 26 27   GLUCOSE 236* 296* 184*  BUN 28* 37* 27*  CREATININE 1.18* 1.38* 1.25*  CALCIUM 9.1 8.9 9.2   GFR: Estimated Creatinine Clearance: 57.3 mL/min (A) (by C-G formula based on SCr of 1.25 mg/dL (H)). Liver Function Tests: No results for input(s): AST, ALT, ALKPHOS, BILITOT, PROT, ALBUMIN in the last 168 hours. No results for input(s): LIPASE, AMYLASE in the last 168 hours. No results for input(s): AMMONIA in the last 168 hours. Coagulation Profile: No results for input(s): INR, PROTIME in the last 168 hours. Cardiac Enzymes: Recent Labs  Lab 06/26/18 0439 06/26/18 0907  TROPONINI <0.03 <0.03   BNP (last 3 results) No results for input(s): PROBNP in the last 8760 hours. HbA1C: No results for input(s): HGBA1C in the last 72 hours. CBG: Recent Labs  Lab 06/29/18 1605 06/29/18 2122 06/30/18 0757 06/30/18 1223 06/30/18 1615  GLUCAP 209* 211* 159* 224* 195*   Lipid Profile: No results for input(s): CHOL, HDL, LDLCALC, TRIG, CHOLHDL, LDLDIRECT in the last 72 hours. Thyroid Function Tests: No results for input(s): TSH, T4TOTAL, FREET4, T3FREE, THYROIDAB in the last 72 hours. Anemia Panel: No results for input(s): VITAMINB12, FOLATE, FERRITIN, TIBC, IRON, RETICCTPCT in the last 72 hours. Urine analysis:    Component Value Date/Time   COLORURINE YELLOW 09/12/2017 1047   APPEARANCEUR Clear 02/25/2018 1050   LABSPEC 1.017 09/12/2017 1047   PHURINE 5.0 09/12/2017 1047   GLUCOSEU Negative 02/25/2018 1050   HGBUR SMALL (A) 09/12/2017 1047   BILIRUBINUR Negative 02/25/2018 1050   KETONESUR NEGATIVE 09/12/2017 1047   PROTEINUR 3+ (A) 02/25/2018 1050   PROTEINUR >=300 (A) 09/12/2017 1047   UROBILINOGEN negative 11/14/2015 0949   NITRITE Negative  02/25/2018 1050   NITRITE NEGATIVE 09/12/2017 1047   LEUKOCYTESUR Negative 02/25/2018 1050   Sepsis Labs: @LABRCNTIP (procalcitonin:4,lacticacidven:4)  ) Recent Results (from the past 240 hour(s))  Surgical PCR screen     Status: None   Collection Time: 06/27/18 11:05 AM  Result Value Ref Range Status   MRSA, PCR NEGATIVE NEGATIVE Final   Staphylococcus aureus NEGATIVE NEGATIVE Final    Comment: (NOTE) The Xpert SA Assay (FDA approved for NASAL specimens in patients 64 years of age and older), is one component of a comprehensive surveillance program. It is not intended to diagnose infection nor to guide or monitor treatment. Performed at [facility], [place] [address], [place], [place] [address]          Radiology Studies: No results found.      Scheduled Meds: . acyclovir  400 mg Oral Daily  . amLODipine  10 mg Oral Daily  . aspirin EC  81 mg Oral Daily  . clopidogrel  75 mg Oral Daily  . fluticasone  2 spray Each Nare Daily  . furosemide  20 mg Oral Daily  .  gabapentin  100 mg Oral TID  . hydrALAZINE  37.5 mg Oral Q8H  . insulin aspart  0-20 Units Subcutaneous TID WC  . insulin aspart  0-5 Units Subcutaneous QHS  . insulin glargine  28 Units Subcutaneous BID  . isosorbide mononitrate  60 mg Oral Daily  . levothyroxine  50 mcg Oral QAC breakfast  . losartan  100 mg Oral Daily  . pantoprazole  40 mg Oral Daily  . sodium chloride flush  3 mL Intravenous Q12H   Continuous Infusions: . sodium chloride       LOS: 4 days    Time spent: 25 minutes    Edwin Dada, MD [facility] 06/30/2018, 4:37 PM     Pager [phone] --- please page though AMION:  [email or web address] Password [email or web address] If 7PM-7AM, please contact night-coverage

## 2018-06-30 NOTE — Progress Notes (Addendum)
Electrophysiology Rounding Note  Patient Name: Mary Raymond Date of Encounter: 06/30/2018  Primary Cardiologist: Domenic Polite Electrophysiologist: Carter Kassel   Subjective   The patient is doing well today.  At this time, the patient denies chest pain, shortness of breath, or any new concerns. Arm swelling significantly improved.   Inpatient Medications    Scheduled Meds: . acyclovir  400 mg Oral Daily  . amLODipine  10 mg Oral Daily  . aspirin EC  81 mg Oral Daily  . clopidogrel  75 mg Oral Daily  . fluticasone  2 spray Each Nare Daily  . furosemide  20 mg Oral Daily  . gabapentin  100 mg Oral TID  . hydrALAZINE  37.5 mg Oral Q8H  . insulin aspart  0-20 Units Subcutaneous TID WC  . insulin aspart  0-5 Units Subcutaneous QHS  . insulin glargine  28 Units Subcutaneous BID  . isosorbide mononitrate  60 mg Oral Daily  . levothyroxine  50 mcg Oral QAC breakfast  . losartan  100 mg Oral Daily  . pantoprazole  40 mg Oral Daily  . sodium chloride flush  3 mL Intravenous Q12H   Continuous Infusions: . sodium chloride     PRN Meds: sodium chloride, [DISCONTINUED] acetaminophen **OR** acetaminophen, acetaminophen, ALPRAZolam, HYDROcodone-acetaminophen, ipratropium-albuterol, nitroGLYCERIN, ondansetron (ZOFRAN) IV, sodium chloride flush   Vital Signs    Vitals:   06/29/18 0601 06/29/18 1400 06/29/18 1959 06/30/18 0533  BP: (!) 165/57 (!) 129/51 (!) 175/68 (!) 171/75  Pulse: 73 60 70 71  Resp: 19 17 18 16   Temp: 97.9 F (36.6 C) 98.4 F (36.9 C) 98.1 F (36.7 C) 98 F (36.7 C)  TempSrc: Oral Oral Oral Oral  SpO2: 91% 95% 93% 96%  Weight: 121.6 kg   119.5 kg  Height:        Intake/Output Summary (Last 24 hours) at 06/30/2018 0720 Last data filed at 06/30/2018 0650 Gross per 24 hour  Intake 720 ml  Output 2100 ml  Net -1380 ml   Filed Weights   06/28/18 0521 06/29/18 0601 06/30/18 0533  Weight: 121.2 kg 121.6 kg 119.5 kg    Physical Exam    GEN- The patient  is elderly and obese appearing, alert and oriented x 3 today.   Head- normocephalic, atraumatic Eyes-  Sclera clear, conjunctiva pink Ears- hearing intact Oropharynx- clear Neck- supple Lungs- Clear to ausculation bilaterally, normal work of breathing Heart- Regular rate and rhythm (paced) GI- soft, NT, ND, + BS Extremities- no clubbing, cyanosis, or edema Skin- no rash or lesion Psych- euthymic mood, full affect Neuro- strength and sensation are intact  Labs    CBC Recent Labs    06/28/18 0445  WBC 9.7  HGB 9.0*  HCT 31.5*  MCV 84.0  PLT 295   Basic Metabolic Panel Recent Labs    06/28/18 0445  NA 139  K 4.7  CL 104  CO2 27  GLUCOSE 184*  BUN 27*  CREATININE 1.25*  CALCIUM 9.2    Telemetry    SR with V pacing (personally reviewed)  Radiology    Dg Chest 2 View  Result Date: 06/28/2018 CLINICAL DATA:  Status post pacemaker placement EXAM: CHEST - 2 VIEW COMPARISON:  06/26/2018 FINDINGS: Cardiac shadow is stable. Pacemaker is now seen in satisfactory position. No pneumothorax is noted. The lungs are well aerated bilaterally. IMPRESSION: No pneumothorax following pacemaker placement Electronically Signed   By: Inez Catalina M.D.   On: 06/28/2018 09:54    Assessment &  Plan    1.  Complete heart block Doing well post pacemaker Symptomatically much improved CXR with leads in stable position Device interrogation reviewed and normal LUE swelling improved with elevation - would not use anticoagulation at this time. Continue ASA/Plavix.  Continue elevation when able. Will re-evaluate at wound check  Routine wound care and follow up.  Instructions/appts entered in AVS.   Leona will sign off.   Medication Recommendations:  Continue ASA/Plavix Other recommendations (labs, testing, etc):  none Follow up as an outpatient:  As scheduled   For questions or updates, please contact [facility] Please consult [email or web address] for contact info under  Cardiology/STEMI.  Signed, Chanetta Marshall, NP  06/30/2018, 7:20 AM    I have seen, examined the patient, and reviewed the above assessment and plan.  Changes to above are made where necessary.  On exam, RRR.  L arm without significant edema this am (with arm elevation overnight).  I suspect that she has developed some degree of venous insufficiency as a result of her procedure.  She has 2 leads within the L suclavian vein now.  This typically takes a little time to adjust to.  I would not advise anticoagulation or further workup at this time.  Continue prior medicines.  We will reassess her arm at wound check which has already bee scheduled.  OK to discharge from EP standpoint.  Electrophysiology team to see as needed while here. Please call with questions.  Co Sign: [name], MD 06/30/2018 8:31 AM

## 2018-06-30 NOTE — Discharge Instructions (Signed)
° ° °  Supplemental Discharge Instructions for  Pacemaker/Defibrillator Patients  Activity No heavy lifting or vigorous activity with your left/right arm for 6 to 8 weeks.  Do not raise your left/right arm above your head for one week.  Gradually raise your affected arm as drawn below.             07/01/18                   07/02/18                   07/03/18                 07/04/18 __  NO DRIVING for  1 week  ; you may begin driving on   76/14/70  .  WOUND CARE - Keep the wound area clean and dry.  Do not get this area wet, no showers until cleared to at your wound check visit . - The tape/steri-strips on your wound will fall off; do not pull them off.  No bandage is needed on the site.  DO  NOT apply any creams, oils, or ointments to the wound area. - If you notice any drainage or discharge from the wound, any swelling or bruising at the site, or you develop a fever > 101? F after you are discharged home, call the office at once.  Special Instructions - You are still able to use cellular telephones; use the ear opposite the side where you have your pacemaker/defibrillator.  Avoid carrying your cellular phone near your device. - When traveling through airports, show security personnel your identification card to avoid being screened in the metal detectors.  Ask the security personnel to use the hand wand. - Avoid arc welding equipment, MRI testing (magnetic resonance imaging), TENS units (transcutaneous nerve stimulators).  Call the office for questions about other devices. - Avoid electrical appliances that are in poor condition or are not properly grounded. - Microwave ovens are safe to be near or to operate.

## 2018-06-30 NOTE — Progress Notes (Signed)
CSW spoke to admissions at [facility]. CSW sent PT/OT notes to facility from evaluations completed over the weekend. They continue to await [facility] authorization for patient. Patient cannot admit to the facility until authorization is received.   Patient's daughter, Mendel Ryder, called and requested update. CSW updated Mendel Ryder that facility awaiting auth.  CSW to follow and support.  Mary Raymond, [place]

## 2018-06-30 NOTE — Care Management Important Message (Signed)
Important Message  Patient Details  Name: Mary Raymond MRN: [id number] Date of Birth: 02-15-1951   Medicare Important Message Given:  Yes    Lamir Racca P Ein Rijo 06/30/2018, 3:41 PM

## 2018-07-01 DIAGNOSIS — I25119 Atherosclerotic heart disease of native coronary artery with unspecified angina pectoris: Secondary | ICD-10-CM | POA: Diagnosis not present

## 2018-07-01 DIAGNOSIS — I82622 Acute embolism and thrombosis of deep veins of left upper extremity: Secondary | ICD-10-CM | POA: Diagnosis not present

## 2018-07-01 DIAGNOSIS — J45998 Other asthma: Secondary | ICD-10-CM | POA: Diagnosis not present

## 2018-07-01 DIAGNOSIS — R0902 Hypoxemia: Secondary | ICD-10-CM | POA: Diagnosis not present

## 2018-07-01 DIAGNOSIS — I672 Cerebral atherosclerosis: Secondary | ICD-10-CM | POA: Diagnosis not present

## 2018-07-01 DIAGNOSIS — E1159 Type 2 diabetes mellitus with other circulatory complications: Secondary | ICD-10-CM | POA: Diagnosis not present

## 2018-07-01 DIAGNOSIS — D649 Anemia, unspecified: Secondary | ICD-10-CM | POA: Diagnosis not present

## 2018-07-01 DIAGNOSIS — E039 Hypothyroidism, unspecified: Secondary | ICD-10-CM | POA: Diagnosis not present

## 2018-07-01 DIAGNOSIS — Z6841 Body Mass Index (BMI) 40.0 and over, adult: Secondary | ICD-10-CM | POA: Diagnosis not present

## 2018-07-01 DIAGNOSIS — I259 Chronic ischemic heart disease, unspecified: Secondary | ICD-10-CM | POA: Diagnosis not present

## 2018-07-01 DIAGNOSIS — M2550 Pain in unspecified joint: Secondary | ICD-10-CM | POA: Diagnosis not present

## 2018-07-01 DIAGNOSIS — E1169 Type 2 diabetes mellitus with other specified complication: Secondary | ICD-10-CM | POA: Diagnosis not present

## 2018-07-01 DIAGNOSIS — Z7401 Bed confinement status: Secondary | ICD-10-CM | POA: Diagnosis not present

## 2018-07-01 DIAGNOSIS — E1122 Type 2 diabetes mellitus with diabetic chronic kidney disease: Secondary | ICD-10-CM | POA: Diagnosis not present

## 2018-07-01 DIAGNOSIS — D631 Anemia in chronic kidney disease: Secondary | ICD-10-CM | POA: Diagnosis not present

## 2018-07-01 DIAGNOSIS — I10 Essential (primary) hypertension: Secondary | ICD-10-CM | POA: Diagnosis not present

## 2018-07-01 DIAGNOSIS — M797 Fibromyalgia: Secondary | ICD-10-CM | POA: Diagnosis not present

## 2018-07-01 DIAGNOSIS — I25110 Atherosclerotic heart disease of native coronary artery with unstable angina pectoris: Secondary | ICD-10-CM | POA: Diagnosis not present

## 2018-07-01 DIAGNOSIS — I442 Atrioventricular block, complete: Secondary | ICD-10-CM | POA: Diagnosis not present

## 2018-07-01 DIAGNOSIS — I441 Atrioventricular block, second degree: Secondary | ICD-10-CM | POA: Diagnosis not present

## 2018-07-01 DIAGNOSIS — I639 Cerebral infarction, unspecified: Secondary | ICD-10-CM | POA: Diagnosis not present

## 2018-07-01 DIAGNOSIS — I959 Hypotension, unspecified: Secondary | ICD-10-CM | POA: Diagnosis not present

## 2018-07-01 DIAGNOSIS — Z48812 Encounter for surgical aftercare following surgery on the circulatory system: Secondary | ICD-10-CM | POA: Diagnosis not present

## 2018-07-01 DIAGNOSIS — R600 Localized edema: Secondary | ICD-10-CM | POA: Diagnosis not present

## 2018-07-01 DIAGNOSIS — Z79899 Other long term (current) drug therapy: Secondary | ICD-10-CM | POA: Diagnosis not present

## 2018-07-01 DIAGNOSIS — I129 Hypertensive chronic kidney disease with stage 1 through stage 4 chronic kidney disease, or unspecified chronic kidney disease: Secondary | ICD-10-CM | POA: Diagnosis not present

## 2018-07-01 DIAGNOSIS — N183 Chronic kidney disease, stage 3 (moderate): Secondary | ICD-10-CM | POA: Diagnosis not present

## 2018-07-01 DIAGNOSIS — Z959 Presence of cardiac and vascular implant and graft, unspecified: Secondary | ICD-10-CM | POA: Diagnosis not present

## 2018-07-01 LAB — GLUCOSE, CAPILLARY
GLUCOSE-CAPILLARY: 124 mg/dL — AB (ref 70–99)
Glucose-Capillary: 239 mg/dL — ABNORMAL HIGH (ref 70–99)

## 2018-07-01 MED ORDER — HYDRALAZINE HCL 25 MG PO TABS: 37.5000 mg | ORAL_TABLET | Freq: Three times a day (TID) | ORAL | 1 refills | Status: DC

## 2018-07-01 MED ORDER — MAGNESIUM HYDROXIDE 400 MG/5ML PO SUSP
30.0000 mL | Freq: Once | ORAL | Status: AC
  Administered 2018-07-01: 30 mL via ORAL
  Filled 2018-07-01: qty 30

## 2018-07-01 MED ORDER — ISOSORBIDE MONONITRATE ER 60 MG PO TB24: 60.0000 mg | ORAL_TABLET | Freq: Every day | ORAL | 1 refills | Status: DC

## 2018-07-01 NOTE — Progress Notes (Signed)
[id number] Came to see pt to offer to walk. Heart rate 73 v paced. Pt stated she walked earlier with PT and resting now. For discharge later to Rehab. Discussed with pt the importance of adhering to pacemaker restrictions and keeping arm down. Encouraged her to walk with PT at Rehab. Pt in good spirits and ready to return so that she can return home in the future. Graylon Good RN BSN 07/01/2018 11:27 AM

## 2018-07-01 NOTE — Progress Notes (Signed)
Physical Therapy Treatment Patient Details Name: Mary Raymond MRN: [id number] DOB: 02/28/1951 Today's Date: 07/01/2018    History of Present Illness 67 y.o. 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 Pt now s/p pacemaker implant on 10/11.     PT Comments    Patient reports pain in LUE is improved today. Tolerated gait training with Min guard assist for safety. Seems more fatigued today as pt required standing rest break. Continues to require use of RW. Able to ambulate short distances in the room furniture walking without DME but balance and energy conservation improved with BUE support. Needs reminders to limit pushing/pulling with LUE. Eager to get to rehab. Will follow.    Follow Up Recommendations  SNF;Supervision for mobility/OOB     Equipment Recommendations  None recommended by PT    Recommendations for Other Services       Precautions / Restrictions Precautions Precautions: Fall;ICD/Pacemaker Precaution Comments: LUE limitations due to recent pacemaker placement  Restrictions Weight Bearing Restrictions: No Other Position/Activity Restrictions: limit pushing/pulling/lifting with LUE    Mobility  Bed Mobility               General bed mobility comments: Sitting EOB upon PT arrival.   Transfers Overall transfer level: Needs assistance Equipment used: None Transfers: Sit to/from Stand Sit to Stand: Min assist;Min guard         General transfer comment: Assist to power to standing with use of momentum; cues to limit pushing through LUE.  Ambulation/Gait Ambulation/Gait assistance: Min guard Gait Distance (Feet): 120 Feet Assistive device: Rolling walker (2 wheeled) Gait Pattern/deviations: Step-through pattern;Decreased stride length;Trunk flexed Gait velocity: decreased    General Gait  Details: Slow, mostly steady gait but limited by fatigue. 1 standing rest break. HR up to 133 bpm today.   Stairs             Wheelchair Mobility    Modified Rankin (Stroke Patients Only) Modified Rankin (Stroke Patients Only) Pre-Morbid Rankin Score: Moderate disability Modified Rankin: Moderately severe disability     Balance Overall balance assessment: Needs assistance Sitting-balance support: Feet supported;No upper extremity supported Sitting balance-Leahy Scale: Good Sitting balance - Comments: Able to reach outside BoS without difficulty. Preparing to do wash up at sink   Standing balance support: During functional activity;No upper extremity supported Standing balance-Leahy Scale: Fair Standing balance comment: Able to walk short distances without UE support but needs RW for longer distances.                             Cognition Arousal/Alertness: Awake/alert Behavior During Therapy: WFL for tasks assessed/performed Overall Cognitive Status: Impaired/Different from baseline Area of Impairment: Memory                     Memory: Decreased short-term memory                Exercises      General Comments        Pertinent Vitals/Pain Pain Assessment: Faces Faces Pain Scale: Hurts little more Pain Location: LUE, back- chronic Pain Descriptors / Indicators: Sore;Aching Pain Intervention(s): Monitored during session;Repositioned    Home Living  Prior Function            PT Goals (current goals can now be found in the care plan section) Progress towards PT goals: Progressing toward goals    Frequency    Min 3X/week      PT Plan Current plan remains appropriate    Co-evaluation              AM-PAC PT "6 Clicks" Daily Activity  Outcome Measure  Difficulty turning over in bed (including adjusting bedclothes, sheets and blankets)?: A Little Difficulty moving from lying on back to  sitting on the side of the bed? : Unable Difficulty sitting down on and standing up from a chair with arms (e.g., wheelchair, bedside commode, etc,.)?: A Little Help needed moving to and from a bed to chair (including a wheelchair)?: A Little Help needed walking in hospital room?: A Little Help needed climbing 3-5 steps with a railing? : A Lot 6 Click Score: 15    End of Session Equipment Utilized During Treatment: Gait belt Activity Tolerance: Patient limited by fatigue Patient left: in bed;with call bell/phone within reach(sitting EOB) Nurse Communication: Mobility status PT Visit Diagnosis: Unsteadiness on feet (R26.81);Other abnormalities of gait and mobility (R26.89);Muscle weakness (generalized) (M62.81);Other symptoms and signs involving the nervous system (R29.898)     Time: 0945-1000 PT Time Calculation (min) (ACUTE ONLY): 15 min  Charges:  $Therapeutic Exercise: 8-22 mins                     Wray Kearns, PT, DPT Acute Rehabilitation Services Pager [phone] Office [place] 07/01/2018, 11:33 AM

## 2018-07-01 NOTE — Clinical Social Work Placement (Signed)
   CLINICAL SOCIAL WORK PLACEMENT  NOTE  Date:  07/01/2018  Patient Details  Name: Mary Raymond MRN: [id number] Date of Birth: [birth year]  Clinical Social Work is seeking post-discharge placement for this patient at the [place] level of care (*CSW will initial, date and re-position this form in  chart as items are completed):  Yes   Patient/family provided with [place] Work Department's list of facilities offering this level of care within the geographic area requested by the patient (or if unable, by the patient's family).  Yes   Patient/family informed of their freedom to choose among providers that offer the needed level of care, that participate in Medicare, Medicaid or managed care program needed by the patient, have an available bed and are willing to accept the patient.  Yes   Patient/family informed of [facility]'s ownership interest in [facility] and [facility], as well as of the fact that they are under no obligation to receive care at these facilities.  PASRR submitted to EDS on       PASRR number received on       Existing PASRR number confirmed on 06/30/18     FL2 transmitted to all facilities in geographic area requested by pt/family on 06/30/18     FL2 transmitted to all facilities within larger geographic area on       Patient informed that his/her managed care company has contracts with or will negotiate with certain facilities, including the following:  [employer] informed of bed offers received.  Patient chooses bed at [facility]     Physician recommends and patient chooses bed at      Patient to be transferred to [facility] on 07/01/18.  Patient to be transferred to facility by [facility]AR     Patient family notified on 07/01/18 of transfer.  Name of family member notified:  Alberta Lenhard, daughter     PHYSICIAN Please prepare priority discharge summary, including  medications, Please prepare prescriptions     Additional Comment:    _______________________________________________ [name], LCSW 07/01/2018, 10:17 AM

## 2018-07-01 NOTE — Progress Notes (Addendum)
[facility] has received [employer] authorization and is ready to accept patient today.  Patient will discharge to [facility]. Anticipated discharge date: 07/01/18 Family notified: Deshawn Skelley, daughter Transportation by: Corey Harold  Nurse to call report to [phone].   CSW signing off.  [name], [place]  Clinical Social Worker

## 2018-07-01 NOTE — Progress Notes (Signed)
Occupational Therapy Treatment Patient Details Name: Mary Raymond MRN: [id number] DOB: [birth year] Today's Date: 07/01/2018    History of present illness 67 y.o. 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 Pt now s/p pacemaker implant on 10/11.    OT comments  Pt demonstrates safety concerns with RW but able to progress to sitting in chair for lunch this session. Pt very motivated to work on recovery and open to education. Pt will abandon RW in narrowed space. OT education on staying within the RW.   Follow Up Recommendations  SNF;Supervision/Assistance - 24 hour    Equipment Recommendations       Recommendations for Other Services      Precautions / Restrictions Precautions Precautions: Fall;ICD/Pacemaker Precaution Comments: LUE limitations due to recent pacemaker placement  Restrictions Weight Bearing Restrictions: No Other Position/Activity Restrictions: limit pushing/pulling/lifting with LUE       Mobility Bed Mobility Overal bed mobility: Modified Independent             General bed mobility comments: pt with HOB slighly elevated  Transfers Overall transfer level: Needs assistance Equipment used: None Transfers: Sit to/from Stand Sit to Stand: Supervision         General transfer comment: use of RW requires vc for safety    Balance Overall balance assessment: Modified Independent Sitting-balance support: Feet supported;No upper extremity supported Sitting balance-Leahy Scale: Good Sitting balance - Comments: Able to reach outside BoS without difficulty. Preparing to do wash up at sink   Standing balance support: During functional activity;No upper extremity supported Standing balance-Leahy Scale: Fair Standing balance comment: Able to walk short distances without UE support but needs RW  for longer distances.                            ADL either performed or assessed with clinical judgement   ADL   Eating/Feeding: Independent;Sitting   Grooming: Wash/dry hands;Modified independent               Lower Body Dressing: Supervision/safety;Sit to/from stand Lower Body Dressing Details (indicate cue type and reason): pt able to sit on eob and use knee flexion to reach feet with LE suported on bed surface Toilet Transfer: Modified Independent   Toileting- Clothing Manipulation and Hygiene: Modified independent       Functional mobility during ADLs: Supervision/safety;Rolling walker General ADL Comments: pt requires cues for RW safety in narrowed spaces. pt encouraged to stay within RW and avoid pushing RW to the side     Vision       Perception     Praxis      Cognition Arousal/Alertness: Awake/alert Behavior During Therapy: WFL for tasks assessed/performed Overall Cognitive Status: Within Functional Limits for tasks assessed Area of Impairment: Memory                     Memory: Decreased short-term memory                  Exercises     Shoulder Instructions       General Comments      Pertinent Vitals/ Pain       Pain Assessment: No/denies pain Faces Pain Scale: Hurts little more Pain Location: LUE, back- chronic Pain Descriptors / Indicators: Sore;Aching Pain Intervention(s): Monitored  during session;Repositioned  Home Living                                          Prior Functioning/Environment              Frequency  Min 2X/week        Progress Toward Goals  OT Goals(current goals can now be found in the care plan section)  Progress towards OT goals: Progressing toward goals  Acute Rehab OT Goals OT Goal Formulation: With patient Time For Goal Achievement: 07/12/18 Potential to Achieve Goals: Good ADL Goals Pt Will Perform Grooming: with modified independence;standing Pt  Will Perform Lower Body Bathing: with supervision;sit to/from stand Pt Will Perform Upper Body Dressing: with modified independence;sitting Pt Will Perform Lower Body Dressing: with supervision;sit to/from stand Pt Will Transfer to Toilet: with supervision;ambulating;regular height toilet Pt Will Perform Toileting - Clothing Manipulation and hygiene: with supervision;sit to/from stand;sitting/lateral leans Pt Will Perform Tub/Shower Transfer: Tub transfer;ambulating;with min guard assist;shower seat Additional ADL Goal #1: Pt will demonstrate compensatory vision techniques during ADLs with 2-3 cues.  Plan Discharge plan remains appropriate    Co-evaluation                 AM-PAC PT "6 Clicks" Daily Activity     Outcome Measure   Help from another person eating meals?: None Help from another person taking care of personal grooming?: A Little Help from another person toileting, which includes using toliet, bedpan, or urinal?: A Little Help from another person bathing (including washing, rinsing, drying)?: A Little Help from another person to put on and taking off regular upper body clothing?: A Little Help from another person to put on and taking off regular lower body clothing?: A Lot 6 Click Score: 18    End of Session Equipment Utilized During Treatment: Rolling walker  OT Visit Diagnosis: Unsteadiness on feet (R26.81);Muscle weakness (generalized) (M62.81)   Activity Tolerance Patient tolerated treatment well   Patient Left in chair;with call bell/phone within reach   Nurse Communication Mobility status;Precautions        Time: 6468-0321 OT Time Calculation (min): 13 min  Charges: OT General Charges $OT Visit: 1 Visit OT Treatments $Self Care/Home Management : 8-22 mins   Jeri Modena, OTR/L  Acute Rehabilitation Services Pager: [phone] Office: [phone] .    Parke Poisson B 07/01/2018, 3:05 PM

## 2018-07-01 NOTE — Discharge Summary (Signed)
Physician Discharge Summary  Mary Raymond QBH:[id number] DOB: Oct 11, 1950 DOA: 06/26/2018  PCP: Mary Balloon, FNP  Admit date: 06/26/2018 Discharge date: 07/01/2018  Admitted From: SNF  Disposition:  SNF   Recommendations for Outpatient Follow-up:  1. Follow up with Electrophysiology Cardiology in 2 weeks 2. Please follow up with Cardiology as directed 3. Please obtain BMP/CBC in one week     Home Health: N/A  Equipment/Devices: TBD at SNF  Discharge Condition: Fair  CODE STATUS: FULL Diet recommendation: Cardiac, Diabetic  Brief/Interim Summary: Mary Raymond is a 67 y.o. 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  Cardiology recommended titrating up Imdur at [facility].  They also suspected bradycardia to be contributing and recommended transfer for pacer evaluation (see below).  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     Aspirin, Plavix, Losartan were continued.  BB was held due to heart block, deferred now to EP.     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  She was transferred to [facility] and underwent pacemaker placement on 06/27/18.  She had no post-operative complications.  LUE swelling This was mild, improving with arm elevation.  EP suspect this is not DVT, recommend outpatietn follow up at her wound check.  Hypertension BP elevated, so hydralazine and Imdur titrated up.    Fibromyalgia  Diabetes Continued on Lantus   Hypothyroidism  Chronic kidney disease stage III Creatinine stable.  Normocytic anemia Hemoglobin stable post-op.          Discharge Instructions  Discharge Instructions    Diet - low sodium heart healthy   Complete by:  As directed    Discharge instructions   Complete by:  As directed    From Dr. Loleta Raymond: You were admitted with fatigue and chest pain.  We found that this was because you had an abnormal heart rate.  For the chest pain: We increased your Imdur (this is the long-acting version of nitroglycerin).  Take 60 mg daily now.  For the heart rate: Dr. Rayann Raymond placed a pacemaker. Follow up with them on Oct 24th. See below for post-placement restrictions and wound care.  For your blood pressure: We restarted losartan We also increased your hydralazine        Supplemental Discharge Instructions for  Pacemaker/Defibrillator Patients  Activity No heavy lifting or vigorous activity with your left/right arm for 6 to 8 weeks.  Do not raise your left/right arm above your head for one week.  Gradually raise your affected arm as drawn below.             07/01/18                   07/02/18                   07/03/18                 07/04/18 __  NO DRIVING for  1 week  ; you may begin driving on   86/57/84  .  WOUND CARE Keep the wound area clean and dry.  Do not get this area wet, no showers until cleared to at your wound check visit . The tape/steri-strips on your wound will fall off; do not pull them off.  No bandage is needed on the site.  DO  NOT apply any creams, oils, or ointments to the wound area. If you notice any drainage or discharge from the wound, any swelling or bruising at the site, or you develop a fever > 101? F after you are discharged home, call the office at once.  Special Instructions You are still able to use cellular telephones; use the ear opposite the side where you have your  pacemaker/defibrillator.  Avoid carrying your cellular phone near your device. When traveling through airports, show security personnel your identification card to avoid being screened in the metal detectors.  Ask the security personnel to use the hand wand. Avoid arc welding equipment, MRI testing (magnetic resonance imaging), TENS units (transcutaneous nerve stimulators).  Call the office for questions about other devices. Avoid electrical appliances that are in poor condition or are not properly grounded. Microwave ovens are safe to be near or to operate.   Increase activity slowly   Complete by:  As directed      Allergies as of 07/01/2018      Reactions   Atorvastatin Other (See Comments)   Leg weakness.      Medication List    TAKE these medications   acyclovir 400 MG tablet Commonly known as:  ZOVIRAX TAKE 1 TABLET EVERY DAY   albuterol 108 (90 Base) MCG/ACT inhaler Commonly known as:  PROVENTIL HFA;VENTOLIN HFA Inhale 1-2 puffs into the lungs every 6 (six) hours as needed for wheezing or shortness of breath.   ALPRAZolam 0.5 MG tablet Commonly known as:  XANAX TAKE 1 TABLET 2 TIMES A DAY AS NEEDED FOR ANXIETY   amLODipine 10 MG tablet Commonly known as:  NORVASC TAKE 1 TABLET EVERY DAY   aspirin 81 MG EC tablet Take 1 tablet (81 mg total) by mouth daily.   clopidogrel 75 MG tablet Commonly known as:  PLAVIX Take 1 tablet (75 mg total) by mouth daily.   fluticasone 50 MCG/ACT nasal spray Commonly known as:  FLONASE USE 2 SPRAYS IN EACH NOSTRIL EVERY DAY   furosemide 20 MG tablet Commonly known as:  LASIX TAKE 1 TABLET (20 MG TOTAL) BY MOUTH DAILY.   gabapentin 100 MG capsule Commonly known as:  NEURONTIN Take 1 capsule (100 mg total) by mouth 3 (three) times daily.   hydrALAZINE 25 MG tablet Commonly known as:  APRESOLINE Take 1.5 tablets (37.5 mg total) by mouth every 8 (eight) hours. What changed:    how much to take  when to take this    HYDROcodone-acetaminophen 7.5-325 MG tablet Commonly known as:  NORCO Take 1 tablet by mouth every 6 (six) hours as needed for moderate pain.   insulin aspart 100 UNIT/ML injection Commonly known as:  novoLOG CBG < 70: implement hypoglycemia protocol CBG 70 - 120: 0 units CBG 121 - 150: 0 units CBG 151 - 200: 0 units CBG 201 - 250: 2 units CBG 251 - 300: 3 units CBG 301 - 350: 4 units CBG 351 - 400: 5 units CBG > 400: call MD   Insulin Glargine 100 UNIT/ML Solostar Pen Commonly known as:  LANTUS Inject  25 Units into the skin 2 (two) times daily.   Ipratropium-Albuterol 20-100 MCG/ACT Aers respimat Commonly known as:  COMBIVENT Inhale 1 puff into the lungs every 6 (six) hours as needed for wheezing.   isosorbide mononitrate 60 MG 24 hr tablet Commonly known as:  IMDUR Take 1 tablet (60 mg total) by mouth daily. Start taking on:  07/02/2018 What changed:    medication strength  how much to take   levothyroxine 50 MCG tablet Commonly known as:  SYNTHROID, LEVOTHROID TAKE 1 TABLET EVERY DAY BEFORE BREAKFAST   losartan 100 MG tablet Commonly known as:  COZAAR TAKE 1 TABLET EVERY DAY   montelukast 10 MG tablet Commonly known as:  SINGULAIR TAKE 1 TABLET (10 MG TOTAL) BY MOUTH AT BEDTIME.   omeprazole 40 MG capsule Commonly known as:  PRILOSEC TAKE 1 CAPSULE EVERY DAY   rosuvastatin 20 MG tablet Commonly known as:  CRESTOR Take 2 tablets (40 mg total) by mouth at bedtime.       Contact information for follow-up providers    [place] Follow up on 07/10/2018.   Specialty:  Cardiology Why:  2:00PM, wound check visit Contact information: [address], [place]       [name], MD Follow up on 10/01/2018.   Specialty:  Cardiology Why:  11:15AM Contact information: [address] [place] [address] [phone]            Contact information for  after-discharge care    Destination    [facility] .   Service:  Skilled Nursing Contact information: [place] [phone]                 Allergies  Allergen Reactions  . Atorvastatin Other (See Comments)    Leg weakness.    Consultations:  Electrophysiology  Cardiology   Procedures/Studies: Ct Angio Head W Or Wo Contrast  Result Date: 06/10/2018 CLINICAL DATA:  67 y/o F; status post NSTEMI and catheterization 06/07/2018. 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 Patient reports blacking out of vision in the lateral quadrant of the right eye. EXAM: CT ANGIOGRAPHY HEAD AND NECK TECHNIQUE: Multidetector CT imaging of the head and neck was performed using the standard protocol during bolus administration of intravenous contrast. Multiplanar CT image reconstructions and MIPs were obtained to evaluate the vascular anatomy. Carotid stenosis measurements (when applicable) are obtained utilizing NASCET criteria, using the distal internal carotid diameter as the denominator. CONTRAST:  50 cc Isovue 370 COMPARISON:  06/08/2018 MRI of the head. FINDINGS: CT HEAD FINDINGS Brain: Faint lucency in the left occipital lobe corresponding to small infarct on prior MRI. No new stroke, hemorrhage, focal mass effect, extra-axial collection, hydrocephalus, or herniation. Few nonspecific hypodensities in white matter are compatible with mild chronic microvascular ischemic changes and there is mild volume loss of the brain. Vascular: As below. Skull: Normal. Negative for fracture or focal lesion. Sinuses: Imaged portions are clear. Orbits: No acute finding. Review of the MIP images confirms the above findings CTA NECK FINDINGS Aortic arch: Standard branching. Imaged portion shows no evidence of aneurysm or dissection. No significant stenosis of the major arch vessel origins. Moderate aortic calcific atherosclerosis. Right carotid system:  No evidence of dissection, stenosis (50% or greater) or occlusion. Calcified plaque of the carotid bifurcation with mild less than 50% proximal ICA stenosis. Left carotid system: No evidence  of dissection, stenosis (50% or greater) or occlusion. Mixed plaque of the carotid bifurcation with mild less than 50% proximal ICA stenosis. Vertebral arteries: Codominant. No evidence of dissection, stenosis (50% or greater) or occlusion. Skeleton: Mild cervical spondylosis. No high-grade bony canal stenosis. Other neck: Right upper paratracheal lymphadenopathy measuring 18 x 15 mm (series 8, image 34). Upper chest: Negative. Review of the MIP images confirms the above findings CTA HEAD FINDINGS Anterior circulation: No significant stenosis, proximal occlusion, aneurysm, or vascular malformation. Calcified plaque of the carotid siphons with mild less less than 50% paraclinoid stenosis, greater on the left. Posterior circulation: No high-grade stenosis, proximal occlusion, aneurysm, or vascular malformation. Right vertebral artery calcified plaque with mild to moderate approximately 50% stenosis. Left vertebral artery calcified plaque with mild less than 50% stenosis. Venous sinuses: As permitted by contrast timing, patent. Anatomic variants: None significant. Delayed phase: No abnormal intracranial enhancement. Review of the MIP images confirms the above findings IMPRESSION: 1. Patent carotid and vertebral arteries. No dissection, aneurysm, or hemodynamically significant stenosis utilizing NASCET criteria. 2. Patent anterior and posterior intracranial circulation. No large vessel occlusion, aneurysm, or high-grade stenosis. 3. Atherosclerosis of the aorta, carotid bifurcations, carotid siphons, and bilateral intracranial vertebral arteries. 4. Small left occipital lobe acute infarction better characterized on prior MRI. No new acute intracranial abnormality on noncontrast CT of head. No abnormal enhancement of the brain. 5.  Right upper paratracheal lymphadenopathy of uncertain significance. 6. Mild cervical spine spondylosis. Electronically Signed   By: Kristine Garbe M.D.   On: 06/10/2018 01:19   Dg Chest 2 View  Result Date: 06/28/2018 CLINICAL DATA:  Status post pacemaker placement EXAM: CHEST - 2 VIEW COMPARISON:  06/26/2018 FINDINGS: Cardiac shadow is stable. Pacemaker is now seen in satisfactory position. No pneumothorax is noted. The lungs are well aerated bilaterally. IMPRESSION: No pneumothorax following pacemaker placement Electronically Signed   By: Inez Catalina M.D.   On: 06/28/2018 09:54   Dg Chest 2 View  Result Date: 06/05/2018 CLINICAL DATA:  Chest pain EXAM: CHEST - 2 VIEW COMPARISON:  12/01/2014 FINDINGS: There is no focal parenchymal opacity. There is no pleural effusion or pneumothorax. There is stable cardiomegaly. There is thoracic aortic atherosclerosis. The osseous structures are unremarkable. IMPRESSION: No active cardiopulmonary disease. Electronically Signed   By: Kathreen Devoid   On: 06/05/2018 15:50   Ct Head Wo Contrast  Result Date: 06/17/2018 CLINICAL DATA:  Initial evaluation for acute headache with neck pain. EXAM: CT HEAD WITHOUT CONTRAST TECHNIQUE: Contiguous axial images were obtained from the base of the skull through the vertex without intravenous contrast. COMPARISON:  Prior CT from 06/10/2018. FINDINGS: Brain: Previously identified small left occipital infarcts not well seen. No new large vessel territory infarct. No acute intracranial hemorrhage. No mass lesion, midline shift or mass effect. No hydrocephalus. No extra-axial fluid collection. Cerebral volume stable, and remains within normal limits. Vascular: Calcified atherosclerosis at the skull base. No hyperdense vessel. Skull: Scalp soft tissues and calvarium demonstrate no acute finding. Sinuses/Orbits: Globes oral soft tissues within normal limits. Mild mucosal thickening within the right maxillary sinus. No mastoid  effusion. Other: None. IMPRESSION: 1. No acute intracranial abnormality. 2. Recently identified small left occipital infarcts not seen by CT. Electronically Signed   By: Jeannine Boga M.D.   On: 06/17/2018 18:47   Ct Head Wo Contrast  Result Date: 06/07/2018 CLINICAL DATA:  67 year old female with altered mental status and nausea. EXAM: CT HEAD WITHOUT CONTRAST TECHNIQUE: Contiguous axial images were obtained  from the base of the skull through the vertex without intravenous contrast. COMPARISON:  Head CT 09/12/2017. FINDINGS: Brain: Cerebral volume is stable and within normal limits for age. No midline shift, ventriculomegaly, mass effect, evidence of mass lesion, intracranial hemorrhage or evidence of cortically based acute infarction. Gray-white matter differentiation is within normal limits throughout the brain. Vascular: Calcified atherosclerosis at the skull base. No suspicious intracranial vascular hyperdensity. Skull: Hyperostosis, compatible with normal variant. Bone mineralization is within normal limits. No acute osseous abnormality identified. Sinuses/Orbits: Visualized paranasal sinuses and mastoids are stable and well pneumatized. Other: No acute orbit or scalp soft tissue findings. IMPRESSION: Stable and normal for age non contrast CT appearance of the brain. Electronically Signed   By: Genevie Ann M.D.   On: 06/07/2018 12:46   Ct Angio Neck W Or Wo Contrast  Result Date: 06/10/2018 CLINICAL DATA:  67 y/o F; status post NSTEMI and catheterization 06/07/2018. 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 Patient reports blacking out of vision in the lateral quadrant of the right eye. EXAM: CT ANGIOGRAPHY HEAD AND NECK TECHNIQUE: Multidetector CT imaging of the head and neck was performed using the standard protocol during bolus administration of intravenous contrast. Multiplanar CT image reconstructions and MIPs were obtained to evaluate the vascular anatomy. Carotid stenosis  measurements (when applicable) are obtained utilizing NASCET criteria, using the distal internal carotid diameter as the denominator. CONTRAST:  50 cc Isovue 370 COMPARISON:  06/08/2018 MRI of the head. FINDINGS: CT HEAD FINDINGS Brain: Faint lucency in the left occipital lobe corresponding to small infarct on prior MRI. No new stroke, hemorrhage, focal mass effect, extra-axial collection, hydrocephalus, or herniation. Few nonspecific hypodensities in white matter are compatible with mild chronic microvascular ischemic changes and there is mild volume loss of the brain. Vascular: As below. Skull: Normal. Negative for fracture or focal lesion. Sinuses: Imaged portions are clear. Orbits: No acute finding. Review of the MIP images confirms the above findings CTA NECK FINDINGS Aortic arch: Standard branching. Imaged portion shows no evidence of aneurysm or dissection. No significant stenosis of the major arch vessel origins. Moderate aortic calcific atherosclerosis. Right carotid system: No evidence of dissection, stenosis (50% or greater) or occlusion. Calcified plaque of the carotid bifurcation with mild less than 50% proximal ICA stenosis. Left carotid system: No evidence of dissection, stenosis (50% or greater) or occlusion. Mixed plaque of the carotid bifurcation with mild less than 50% proximal ICA stenosis. Vertebral arteries: Codominant. No evidence of dissection, stenosis (50% or greater) or occlusion. Skeleton: Mild cervical spondylosis. No high-grade bony canal stenosis. Other neck: Right upper paratracheal lymphadenopathy measuring 18 x 15 mm (series 8, image 34). Upper chest: Negative. Review of the MIP images confirms the above findings CTA HEAD FINDINGS Anterior circulation: No significant stenosis, proximal occlusion, aneurysm, or vascular malformation. Calcified plaque of the carotid siphons with mild less less than 50% paraclinoid stenosis, greater on the left. Posterior circulation: No high-grade  stenosis, proximal occlusion, aneurysm, or vascular malformation. Right vertebral artery calcified plaque with mild to moderate approximately 50% stenosis. Left vertebral artery calcified plaque with mild less than 50% stenosis. Venous sinuses: As permitted by contrast timing, patent. Anatomic variants: None significant. Delayed phase: No abnormal intracranial enhancement. Review of the MIP images confirms the above findings IMPRESSION: 1. Patent carotid and vertebral arteries. No dissection, aneurysm, or hemodynamically significant stenosis utilizing NASCET criteria. 2. Patent anterior and posterior intracranial circulation. No large vessel occlusion, aneurysm, or high-grade stenosis. 3. Atherosclerosis  of the aorta, carotid bifurcations, carotid siphons, and bilateral intracranial vertebral arteries. 4. Small left occipital lobe acute infarction better characterized on prior MRI. No new acute intracranial abnormality on noncontrast CT of head. No abnormal enhancement of the brain. 5. Right upper paratracheal lymphadenopathy of uncertain significance. 6. Mild cervical spine spondylosis. Electronically Signed   By: Kristine Garbe M.D.   On: 06/10/2018 01:19   Mr Brain Wo Contrast  Result Date: 06/08/2018 CLINICAL DATA:  Altered mental status, nausea. Suspect stroke. History of hypertension, hyperlipidemia, diabetes. EXAM: MRI HEAD WITHOUT CONTRAST TECHNIQUE: Multiplanar, multiecho pulse sequences of the brain and surrounding structures were obtained without intravenous contrast. COMPARISON:  CT HEAD June 07, 2018. FINDINGS: INTRACRANIAL CONTENTS: Subcentimeter foci reduced diffusion LEFT occipital lobe with low ADC values. No susceptibility artifact to suggest hemorrhage. Ventricles and sulci are normal for patient's age. A fused subcentimeter supratentorial and patchy pontine white matter FLAIR T2 hyperintensities. No midline shift, mass effect or masses. No abnormal extra-axial fluid  collections. VASCULAR: Normal major intracranial vascular flow voids present at skull base. Loss of LEFT mid to distal posterior cerebral artery flow void versus artifact. SKULL AND UPPER CERVICAL SPINE: No abnormal sellar expansion. No suspicious calvarial bone marrow signal. Craniocervical junction maintained. SINUSES/ORBITS: The mastoid air-cells and included paranasal sinuses are well-aerated.The included ocular globes and orbital contents are non-suspicious. OTHER: None. IMPRESSION: 1. 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 2. Occluded LEFT mid to distal PCA versus artifact. Consider CTA versus MRA head. 3. Mild chronic small vessel ischemic changes. 4. These results will be called to the ordering clinician or representative by the Radiologist Assistant, and communication documented in the PACS or zVision Dashboard. Electronically Signed   By: Elon Alas M.D.   On: 06/08/2018 13:23   Dg Chest Port 1 View  Result Date: 06/26/2018 CLINICAL DATA:  Chest pain and shortness of breath. Similar pain 2 weeks ago with STEMI. History of asthma, diabetes, hypertension, breast lumpectomy EXAM: PORTABLE CHEST 1 VIEW COMPARISON:  06/05/2018 FINDINGS: Cardiac enlargement. No airspace disease or consolidation in the lungs. No blunting of costophrenic angles. No pneumothorax. Mediastinal contours appear intact. Calcification of the aorta. IMPRESSION: No active disease. Electronically Signed   By: Lucienne Capers M.D.   On: 06/26/2018 04:02       Subjective: Some tenderness at her insertion site, improving.  Left arm improving.   Mild headache today.  No chest pain, palpitations, dyspnea, leg swelling.  Discharge Exam: Vitals:   07/01/18 0557 07/01/18 0824  BP: (!) 145/59 (!) 140/56  Pulse:    Resp:    Temp:    SpO2:     Vitals:   06/30/18 2148 07/01/18 0418 07/01/18 0557 07/01/18 0824  BP: (!) 124/46 132/83 (!) 145/59 (!) 140/56  Pulse:  67    Resp:      Temp:  98.5 F (36.9  C)    TempSrc:  Oral    SpO2:  94%    Weight:  118.9 kg    Height:        General: Pt is alert, awake, not in acute distress, lying in bed, interactive Cardiovascular: RRR, nl S1-S2, no murmurs appreciated.   No LE edema.   Respiratory: Normal respiratory rate and rhythm.  CTAB without rales or wheezes. Neuro/Psych: Strength symmetric in upper and lower extremities.  Judgment and insight appear normal.   The results of significant diagnostics from this hospitalization (including imaging, microbiology, ancillary and laboratory) are listed below for reference.  Microbiology: Recent Results (from the past 240 hour(s))  Surgical PCR screen     Status: None   Collection Time: 06/27/18 11:05 AM  Result Value Ref Range Status   MRSA, PCR NEGATIVE NEGATIVE Final   Staphylococcus aureus NEGATIVE NEGATIVE Final    Comment: (NOTE) The Xpert SA Assay (FDA approved for NASAL specimens in patients 72 years of age and older), is one component of a comprehensive surveillance program. It is not intended to diagnose infection nor to guide or monitor treatment. Performed at [facility], [place] [address], [place], [place] [address]      Labs: BNP (last 3 results) Recent Labs    06/05/18 1526  BNP 240.9*   Basic Metabolic Panel: Recent Labs  Lab 06/26/18 0439 06/27/18 0534 06/28/18 0445  NA 140 138 139  K 4.6 4.8 4.7  CL 104 101 104  CO2 27 26 27   GLUCOSE 236* 296* 184*  BUN 28* 37* 27*  CREATININE 1.18* 1.38* 1.25*  CALCIUM 9.1 8.9 9.2   Liver Function Tests: No results for input(s): AST, ALT, ALKPHOS, BILITOT, PROT, ALBUMIN in the last 168 hours. No results for input(s): LIPASE, AMYLASE in the last 168 hours. No results for input(s): AMMONIA in the last 168 hours. CBC: Recent Labs  Lab 06/26/18 0439 06/27/18 0534 06/28/18 0445  WBC 10.0 9.3 9.7  NEUTROABS 5.7  --   --   HGB 9.1* 8.9* 9.0*  HCT 31.3* 30.6* 31.5*  MCV 82.8 83.2 84.0  PLT 289 243 279    Cardiac Enzymes: Recent Labs  Lab 06/26/18 0439 06/26/18 0907  TROPONINI <0.03 <0.03   BNP: Invalid input(s): POCBNP CBG: Recent Labs  Lab 06/30/18 0757 06/30/18 1223 06/30/18 1615 06/30/18 2116 07/01/18 0727  GLUCAP 159* 224* 195* 260* 124*   D-Dimer No results for input(s): DDIMER in the last 72 hours. Hgb A1c No results for input(s): HGBA1C in the last 72 hours. Lipid Profile No results for input(s): CHOL, HDL, LDLCALC, TRIG, CHOLHDL, LDLDIRECT in the last 72 hours. Thyroid function studies No results for input(s): TSH, T4TOTAL, T3FREE, THYROIDAB in the last 72 hours.  Invalid input(s): FREET3 Anemia work up No results for input(s): VITAMINB12, FOLATE, FERRITIN, TIBC, IRON, RETICCTPCT in the last 72 hours. Urinalysis    Component Value Date/Time   COLORURINE YELLOW 09/12/2017 1047   APPEARANCEUR Clear 02/25/2018 1050   LABSPEC 1.017 09/12/2017 1047   PHURINE 5.0 09/12/2017 1047   GLUCOSEU Negative 02/25/2018 1050   HGBUR SMALL (A) 09/12/2017 1047   BILIRUBINUR Negative 02/25/2018 1050   KETONESUR NEGATIVE 09/12/2017 1047   PROTEINUR 3+ (A) 02/25/2018 1050   PROTEINUR >=300 (A) 09/12/2017 1047   UROBILINOGEN negative 11/14/2015 0949   NITRITE Negative 02/25/2018 1050   NITRITE NEGATIVE 09/12/2017 1047   LEUKOCYTESUR Negative 02/25/2018 1050   Sepsis Labs Invalid input(s): PROCALCITONIN,  WBC,  LACTICIDVEN Microbiology Recent Results (from the past 240 hour(s))  Surgical PCR screen     Status: None   Collection Time: 06/27/18 11:05 AM  Result Value Ref Range Status   MRSA, PCR NEGATIVE NEGATIVE Final   Staphylococcus aureus NEGATIVE NEGATIVE Final    Comment: (NOTE) The Xpert SA Assay (FDA approved for NASAL specimens in patients 39 years of age and older), is one component of a comprehensive surveillance program. It is not intended to diagnose infection nor to guide or monitor treatment. Performed at [facility], [place] [address],  [place], [place] [address]      Time  coordinating discharge: 20 minutes       SIGNED:   Edwin Dada, MD  [facility] 07/01/2018, 10:54 AM

## 2018-07-10 ENCOUNTER — Ambulatory Visit (INDEPENDENT_AMBULATORY_CARE_PROVIDER_SITE_OTHER): Payer: Medicare HMO | Admitting: *Deleted

## 2018-07-10 DIAGNOSIS — I441 Atrioventricular block, second degree: Secondary | ICD-10-CM

## 2018-07-10 NOTE — Progress Notes (Signed)
Wound check appointment. Steri-strips removed. Wound without redness or edema. Right incision corner approximated with a small amount of serous fluid JA notified and recommended applying steri-strips . Normal device function. Thresholds, sensing, and impedances consistent with implant measurements. Device programmed at 3.5V on for extra safety margin until 3 month visit. Histogram distribution appropriate for patient and level of activity. No mode switches or high ventricular rates noted. Patient educated about wound care, arm mobility, lifting restrictions. ROV w/ DC on 10/31 for wound re-check.

## 2018-07-14 DIAGNOSIS — Z48812 Encounter for surgical aftercare following surgery on the circulatory system: Secondary | ICD-10-CM | POA: Diagnosis not present

## 2018-07-15 DIAGNOSIS — Z48812 Encounter for surgical aftercare following surgery on the circulatory system: Secondary | ICD-10-CM | POA: Diagnosis not present

## 2018-07-16 DIAGNOSIS — Z48812 Encounter for surgical aftercare following surgery on the circulatory system: Secondary | ICD-10-CM | POA: Diagnosis not present

## 2018-07-17 ENCOUNTER — Ambulatory Visit: Payer: Medicare HMO

## 2018-07-17 DIAGNOSIS — Z48812 Encounter for surgical aftercare following surgery on the circulatory system: Secondary | ICD-10-CM | POA: Diagnosis not present

## 2018-07-18 DIAGNOSIS — Z48812 Encounter for surgical aftercare following surgery on the circulatory system: Secondary | ICD-10-CM | POA: Diagnosis not present

## 2018-07-19 DIAGNOSIS — Z48812 Encounter for surgical aftercare following surgery on the circulatory system: Secondary | ICD-10-CM | POA: Diagnosis not present

## 2018-07-20 DIAGNOSIS — Z48812 Encounter for surgical aftercare following surgery on the circulatory system: Secondary | ICD-10-CM | POA: Diagnosis not present

## 2018-07-21 ENCOUNTER — Encounter: Payer: Self-pay | Admitting: Adult Health

## 2018-07-21 ENCOUNTER — Ambulatory Visit (INDEPENDENT_AMBULATORY_CARE_PROVIDER_SITE_OTHER): Payer: Medicare HMO | Admitting: Adult Health

## 2018-07-21 VITALS — BP 119/65 | HR 82 | Ht 65.0 in | Wt 271.2 lb

## 2018-07-21 DIAGNOSIS — I10 Essential (primary) hypertension: Secondary | ICD-10-CM

## 2018-07-21 DIAGNOSIS — E1169 Type 2 diabetes mellitus with other specified complication: Secondary | ICD-10-CM | POA: Diagnosis not present

## 2018-07-21 DIAGNOSIS — E1159 Type 2 diabetes mellitus with other circulatory complications: Secondary | ICD-10-CM | POA: Diagnosis not present

## 2018-07-21 DIAGNOSIS — E785 Hyperlipidemia, unspecified: Secondary | ICD-10-CM | POA: Diagnosis not present

## 2018-07-21 DIAGNOSIS — I63532 Cerebral infarction due to unspecified occlusion or stenosis of left posterior cerebral artery: Secondary | ICD-10-CM | POA: Diagnosis not present

## 2018-07-21 DIAGNOSIS — E1165 Type 2 diabetes mellitus with hyperglycemia: Secondary | ICD-10-CM

## 2018-07-21 DIAGNOSIS — Z48812 Encounter for surgical aftercare following surgery on the circulatory system: Secondary | ICD-10-CM | POA: Diagnosis not present

## 2018-07-21 DIAGNOSIS — IMO0001 Reserved for inherently not codable concepts without codable children: Secondary | ICD-10-CM
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
## 2018-07-21 NOTE — Patient Instructions (Signed)
Continue aspirin 81 mg daily and clopidogrel 75 mg daily  and Crestor  for secondary stroke prevention  Continue to follow up with PCP regarding cholesterol, blood pressure and diabetes management   You will be called to schedule sleep study at our [place] sleep clinic to rule out sleep apnea  Continue therapies physical and occupational therapies   Recommend to follow up with Dr. Rayann Heman regarding 30 day cardiac monitor to rule out atrial fibrillation  Continue to monitor blood pressure at home  Maintain strict control of hypertension with blood pressure goal below 130/90, diabetes with hemoglobin A1c goal below 6.5% and cholesterol with LDL cholesterol (bad cholesterol) goal below 70 mg/dL. I also advised the patient to eat a healthy diet with plenty of whole grains, cereals, fruits and vegetables, exercise regularly and maintain ideal body weight.  Followup in the future with me in 3 months or call earlier if needed       Thank you for coming to see [place] at [facility]. I hope we have been able to provide you high quality care today.  You may receive a patient satisfaction survey over the next few weeks. We would appreciate your feedback and comments so that we may continue to improve ourselves and the health of our patients.

## 2018-07-22 DIAGNOSIS — Z48812 Encounter for surgical aftercare following surgery on the circulatory system: Secondary | ICD-10-CM | POA: Diagnosis not present

## 2018-07-22 NOTE — Progress Notes (Signed)
I agree with the above plan 

## 2018-07-23 ENCOUNTER — Ambulatory Visit (INDEPENDENT_AMBULATORY_CARE_PROVIDER_SITE_OTHER): Payer: Medicare HMO | Admitting: *Deleted

## 2018-07-23 ENCOUNTER — Other Ambulatory Visit: Payer: Self-pay | Admitting: Family

## 2018-07-23 DIAGNOSIS — Z48812 Encounter for surgical aftercare following surgery on the circulatory system: Secondary | ICD-10-CM | POA: Diagnosis not present

## 2018-07-23 DIAGNOSIS — I441 Atrioventricular block, second degree: Secondary | ICD-10-CM

## 2018-07-23 NOTE — Progress Notes (Signed)
Wound re-check appointment. Steri strips removed. Wound without redness, edema, or drainage. Incision edges approximated, wound well healed. Patient educated on signs and symptoms of infection. Patient verbalized understanding. ROV with JA 10/01/18

## 2018-07-24 ENCOUNTER — Emergency Department (HOSPITAL_COMMUNITY): Payer: Medicare HMO

## 2018-07-24 ENCOUNTER — Emergency Department (HOSPITAL_COMMUNITY)
Admission: EM | Admit: 2018-07-24 | Discharge: 2018-07-24 | Disposition: A | Discharge status: Home / Self Care | Payer: Medicare HMO | Attending: Emergency Medicine | Admitting: Emergency Medicine

## 2018-07-24 ENCOUNTER — Other Ambulatory Visit: Payer: Self-pay

## 2018-07-24 ENCOUNTER — Encounter (HOSPITAL_COMMUNITY): Payer: Self-pay | Admitting: Emergency Medicine

## 2018-07-24 DIAGNOSIS — Z950 Presence of cardiac pacemaker: Secondary | ICD-10-CM | POA: Insufficient documentation

## 2018-07-24 DIAGNOSIS — I10 Essential (primary) hypertension: Secondary | ICD-10-CM | POA: Insufficient documentation

## 2018-07-24 DIAGNOSIS — I2510 Atherosclerotic heart disease of native coronary artery without angina pectoris: Secondary | ICD-10-CM | POA: Diagnosis not present

## 2018-07-24 DIAGNOSIS — J45909 Unspecified asthma, uncomplicated: Secondary | ICD-10-CM | POA: Insufficient documentation

## 2018-07-24 DIAGNOSIS — Z743 Need for continuous supervision: Secondary | ICD-10-CM | POA: Diagnosis not present

## 2018-07-24 DIAGNOSIS — Z7982 Long term (current) use of aspirin: Secondary | ICD-10-CM | POA: Insufficient documentation

## 2018-07-24 DIAGNOSIS — R0789 Other chest pain: Secondary | ICD-10-CM | POA: Insufficient documentation

## 2018-07-24 DIAGNOSIS — R0989 Other specified symptoms and signs involving the circulatory and respiratory systems: Secondary | ICD-10-CM | POA: Diagnosis not present

## 2018-07-24 DIAGNOSIS — I639 Cerebral infarction, unspecified: Secondary | ICD-10-CM | POA: Diagnosis not present

## 2018-07-24 DIAGNOSIS — E1165 Type 2 diabetes mellitus with hyperglycemia: Secondary | ICD-10-CM | POA: Diagnosis not present

## 2018-07-24 DIAGNOSIS — E119 Type 2 diabetes mellitus without complications: Secondary | ICD-10-CM | POA: Diagnosis not present

## 2018-07-24 DIAGNOSIS — Z794 Long term (current) use of insulin: Secondary | ICD-10-CM | POA: Diagnosis not present

## 2018-07-24 DIAGNOSIS — E039 Hypothyroidism, unspecified: Secondary | ICD-10-CM | POA: Insufficient documentation

## 2018-07-24 DIAGNOSIS — R0689 Other abnormalities of breathing: Secondary | ICD-10-CM | POA: Diagnosis not present

## 2018-07-24 DIAGNOSIS — Z79899 Other long term (current) drug therapy: Secondary | ICD-10-CM | POA: Insufficient documentation

## 2018-07-24 DIAGNOSIS — Z48812 Encounter for surgical aftercare following surgery on the circulatory system: Secondary | ICD-10-CM | POA: Diagnosis not present

## 2018-07-24 DIAGNOSIS — G459 Transient cerebral ischemic attack, unspecified: Secondary | ICD-10-CM | POA: Diagnosis not present

## 2018-07-24 DIAGNOSIS — I214 Non-ST elevation (NSTEMI) myocardial infarction: Secondary | ICD-10-CM | POA: Diagnosis not present

## 2018-07-24 DIAGNOSIS — Z7902 Long term (current) use of antithrombotics/antiplatelets: Secondary | ICD-10-CM | POA: Diagnosis not present

## 2018-07-24 DIAGNOSIS — R079 Chest pain, unspecified: Secondary | ICD-10-CM | POA: Diagnosis not present

## 2018-07-24 DIAGNOSIS — E785 Hyperlipidemia, unspecified: Secondary | ICD-10-CM | POA: Diagnosis not present

## 2018-07-24 DIAGNOSIS — R5381 Other malaise: Secondary | ICD-10-CM | POA: Diagnosis not present

## 2018-07-24 LAB — COMPREHENSIVE METABOLIC PANEL
ALBUMIN: 3.4 g/dL — AB (ref 3.5–5.0)
ALK PHOS: 114 U/L (ref 38–126)
ALT: 12 U/L (ref 0–44)
AST: 13 U/L — AB (ref 15–41)
Anion gap: 10 (ref 5–15)
BILIRUBIN TOTAL: 0.4 mg/dL (ref 0.3–1.2)
BUN: 37 mg/dL — AB (ref 8–23)
CALCIUM: 9.1 mg/dL (ref 8.9–10.3)
CO2: 24 mmol/L (ref 22–32)
CREATININE: 1.64 mg/dL — AB (ref 0.44–1.00)
Chloride: 102 mmol/L (ref 98–111)
GFR calc Af Amer: 37 mL/min — ABNORMAL LOW (ref >60)
GFR calc non Af Amer: 32 mL/min — ABNORMAL LOW (ref >60)
GLUCOSE: 292 mg/dL — AB (ref 70–99)
Potassium: 4.3 mmol/L (ref 3.5–5.1)
Sodium: 136 mmol/L (ref 135–145)
Total Protein: 7.4 g/dL (ref 6.5–8.1)

## 2018-07-24 LAB — CBC WITH DIFFERENTIAL/PLATELET
ABS IMMATURE GRANULOCYTES: 0.15 10*3/uL — AB (ref 0.00–0.07)
Basophils Absolute: 0.1 10*3/uL (ref 0.0–0.1)
Basophils Relative: 0 %
Eosinophils Absolute: 0.1 10*3/uL (ref 0.0–0.5)
Eosinophils Relative: 1 %
HCT: 31.6 % — ABNORMAL LOW (ref 36.0–46.0)
HEMOGLOBIN: 9.3 g/dL — AB (ref 12.0–15.0)
IMMATURE GRANULOCYTES: 1 %
LYMPHS PCT: 20 %
Lymphs Abs: 2.5 10*3/uL (ref 0.7–4.0)
MCH: 24.5 pg — AB (ref 26.0–34.0)
MCHC: 29.4 g/dL — ABNORMAL LOW (ref 30.0–36.0)
MCV: 83.2 fL (ref 80.0–100.0)
MONO ABS: 0.8 10*3/uL (ref 0.1–1.0)
MONOS PCT: 7 %
NEUTROS ABS: 9.2 10*3/uL — AB (ref 1.7–7.7)
NEUTROS PCT: 71 %
Platelets: 245 10*3/uL (ref 150–400)
RBC: 3.8 MIL/uL — ABNORMAL LOW (ref 3.87–5.11)
RDW: 14.7 % (ref 11.5–15.5)
WBC: 12.9 10*3/uL — ABNORMAL HIGH (ref 4.0–10.5)
nRBC: 0 % (ref 0.0–0.2)

## 2018-07-24 LAB — CBG MONITORING, ED: Glucose-Capillary: 281 mg/dL — ABNORMAL HIGH (ref 70–99)

## 2018-07-24 LAB — TROPONIN I: Troponin I: <0.03 ng/mL (ref <0.03)

## 2018-07-24 LAB — MAGNESIUM: Magnesium: 2 mg/dL (ref 1.7–2.4)

## 2018-07-24 LAB — PROTIME-INR
INR: 0.98
Prothrombin Time: 12.9 seconds (ref 11.4–15.2)

## 2018-07-24 LAB — BRAIN NATRIURETIC PEPTIDE: B Natriuretic Peptide: 49 pg/mL (ref 0.0–100.0)

## 2018-07-24 MED ORDER — ASPIRIN 81 MG PO CHEW: 324.0000 mg | CHEWABLE_TABLET | Freq: Once | ORAL | Status: DC
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
## 2018-07-24 NOTE — Discharge Instructions (Addendum)
As discussed, your evaluation today has been largely reassuring.  But, it is important that you monitor your condition carefully, and do not hesitate to return to the ED if you develop new, or concerning changes in your condition. ? ?Otherwise, please follow-up with your physician for appropriate ongoing care. ? ?

## 2018-07-24 NOTE — ED Triage Notes (Addendum)
Chest pain on and off x 2 days.  Nitro earlier today x 2 with relief, but pain is back now.  cbg in 300's per ems

## 2018-07-28 DIAGNOSIS — Z48812 Encounter for surgical aftercare following surgery on the circulatory system: Secondary | ICD-10-CM | POA: Diagnosis not present

## 2018-07-29 DIAGNOSIS — Z48812 Encounter for surgical aftercare following surgery on the circulatory system: Secondary | ICD-10-CM | POA: Diagnosis not present

## 2018-07-31 DIAGNOSIS — M542 Cervicalgia: Secondary | ICD-10-CM | POA: Diagnosis not present

## 2018-08-01 DIAGNOSIS — Z48812 Encounter for surgical aftercare following surgery on the circulatory system: Secondary | ICD-10-CM | POA: Diagnosis not present

## 2018-08-02 DIAGNOSIS — Z48812 Encounter for surgical aftercare following surgery on the circulatory system: Secondary | ICD-10-CM | POA: Diagnosis not present

## 2018-08-04 ENCOUNTER — Other Ambulatory Visit: Payer: Self-pay | Admitting: Family

## 2018-08-04 DIAGNOSIS — D649 Anemia, unspecified: Secondary | ICD-10-CM | POA: Diagnosis not present

## 2018-08-04 DIAGNOSIS — Z48812 Encounter for surgical aftercare following surgery on the circulatory system: Secondary | ICD-10-CM | POA: Diagnosis not present

## 2018-08-04 DIAGNOSIS — Z794 Long term (current) use of insulin: Secondary | ICD-10-CM

## 2018-08-04 DIAGNOSIS — E1165 Type 2 diabetes mellitus with hyperglycemia: Principal | ICD-10-CM

## 2018-08-04 DIAGNOSIS — IMO0001 Reserved for inherently not codable concepts without codable children: Secondary | ICD-10-CM

## 2018-08-05 DIAGNOSIS — Z48812 Encounter for surgical aftercare following surgery on the circulatory system: Secondary | ICD-10-CM | POA: Diagnosis not present

## 2018-08-06 DIAGNOSIS — Z48812 Encounter for surgical aftercare following surgery on the circulatory system: Secondary | ICD-10-CM | POA: Diagnosis not present

## 2018-08-07 DIAGNOSIS — Z48812 Encounter for surgical aftercare following surgery on the circulatory system: Secondary | ICD-10-CM | POA: Diagnosis not present

## 2018-08-08 DIAGNOSIS — Z48812 Encounter for surgical aftercare following surgery on the circulatory system: Secondary | ICD-10-CM | POA: Diagnosis not present

## 2018-08-10 DIAGNOSIS — Z48812 Encounter for surgical aftercare following surgery on the circulatory system: Secondary | ICD-10-CM | POA: Diagnosis not present

## 2018-08-11 DIAGNOSIS — Z48812 Encounter for surgical aftercare following surgery on the circulatory system: Secondary | ICD-10-CM | POA: Diagnosis not present

## 2018-08-12 DIAGNOSIS — Z48812 Encounter for surgical aftercare following surgery on the circulatory system: Secondary | ICD-10-CM | POA: Diagnosis not present

## 2018-08-13 DIAGNOSIS — Z48812 Encounter for surgical aftercare following surgery on the circulatory system: Secondary | ICD-10-CM | POA: Diagnosis not present

## 2018-08-15 DIAGNOSIS — Z48812 Encounter for surgical aftercare following surgery on the circulatory system: Secondary | ICD-10-CM | POA: Diagnosis not present

## 2018-08-18 DIAGNOSIS — I639 Cerebral infarction, unspecified: Secondary | ICD-10-CM | POA: Diagnosis not present

## 2018-08-18 DIAGNOSIS — E785 Hyperlipidemia, unspecified: Secondary | ICD-10-CM | POA: Diagnosis not present

## 2018-08-18 DIAGNOSIS — E1165 Type 2 diabetes mellitus with hyperglycemia: Secondary | ICD-10-CM | POA: Diagnosis not present

## 2018-08-18 DIAGNOSIS — I214 Non-ST elevation (NSTEMI) myocardial infarction: Secondary | ICD-10-CM | POA: Diagnosis not present

## 2018-08-19 DIAGNOSIS — Z48812 Encounter for surgical aftercare following surgery on the circulatory system: Secondary | ICD-10-CM | POA: Diagnosis not present

## 2018-08-21 DIAGNOSIS — Z48812 Encounter for surgical aftercare following surgery on the circulatory system: Secondary | ICD-10-CM | POA: Diagnosis not present

## 2018-08-22 DIAGNOSIS — Z48812 Encounter for surgical aftercare following surgery on the circulatory system: Secondary | ICD-10-CM | POA: Diagnosis not present

## 2018-08-23 DIAGNOSIS — Z48812 Encounter for surgical aftercare following surgery on the circulatory system: Secondary | ICD-10-CM | POA: Diagnosis not present

## 2018-08-25 DIAGNOSIS — Z48812 Encounter for surgical aftercare following surgery on the circulatory system: Secondary | ICD-10-CM | POA: Diagnosis not present

## 2018-08-26 ENCOUNTER — Telehealth: Payer: Self-pay | Admitting: Adult Health

## 2018-08-26 DIAGNOSIS — Z48812 Encounter for surgical aftercare following surgery on the circulatory system: Secondary | ICD-10-CM | POA: Diagnosis not present

## 2018-08-26 NOTE — Telephone Encounter (Signed)
Lamount Cohen with [facility] called in stating she needs to get the patient scheduled for sleep study. Her phone number is [phone].

## 2018-08-27 DIAGNOSIS — Z48812 Encounter for surgical aftercare following surgery on the circulatory system: Secondary | ICD-10-CM | POA: Diagnosis not present

## 2018-08-28 DIAGNOSIS — Z48812 Encounter for surgical aftercare following surgery on the circulatory system: Secondary | ICD-10-CM | POA: Diagnosis not present

## 2018-08-29 DIAGNOSIS — Z48812 Encounter for surgical aftercare following surgery on the circulatory system: Secondary | ICD-10-CM | POA: Diagnosis not present

## 2018-09-01 DIAGNOSIS — I5020 Unspecified systolic (congestive) heart failure: Secondary | ICD-10-CM | POA: Diagnosis not present

## 2018-09-01 DIAGNOSIS — Z79899 Other long term (current) drug therapy: Secondary | ICD-10-CM | POA: Diagnosis not present

## 2018-09-01 DIAGNOSIS — D649 Anemia, unspecified: Secondary | ICD-10-CM | POA: Diagnosis not present

## 2018-09-02 DIAGNOSIS — Z48812 Encounter for surgical aftercare following surgery on the circulatory system: Secondary | ICD-10-CM | POA: Diagnosis not present

## 2018-09-03 DIAGNOSIS — Z48812 Encounter for surgical aftercare following surgery on the circulatory system: Secondary | ICD-10-CM | POA: Diagnosis not present

## 2018-09-04 DIAGNOSIS — Z48812 Encounter for surgical aftercare following surgery on the circulatory system: Secondary | ICD-10-CM | POA: Diagnosis not present

## 2018-09-05 ENCOUNTER — Encounter: Payer: Self-pay | Admitting: Internal Medicine

## 2018-09-05 DIAGNOSIS — Z48812 Encounter for surgical aftercare following surgery on the circulatory system: Secondary | ICD-10-CM | POA: Diagnosis not present

## 2018-09-06 DIAGNOSIS — Z48812 Encounter for surgical aftercare following surgery on the circulatory system: Secondary | ICD-10-CM | POA: Diagnosis not present

## 2018-09-07 DIAGNOSIS — Z48812 Encounter for surgical aftercare following surgery on the circulatory system: Secondary | ICD-10-CM | POA: Diagnosis not present

## 2018-09-08 DIAGNOSIS — Z48812 Encounter for surgical aftercare following surgery on the circulatory system: Secondary | ICD-10-CM | POA: Diagnosis not present

## 2018-09-09 DIAGNOSIS — I25119 Atherosclerotic heart disease of native coronary artery with unspecified angina pectoris: Secondary | ICD-10-CM | POA: Diagnosis not present

## 2018-09-09 DIAGNOSIS — M5442 Lumbago with sciatica, left side: Secondary | ICD-10-CM | POA: Diagnosis not present

## 2018-09-09 DIAGNOSIS — M797 Fibromyalgia: Secondary | ICD-10-CM | POA: Diagnosis not present

## 2018-09-09 DIAGNOSIS — I214 Non-ST elevation (NSTEMI) myocardial infarction: Secondary | ICD-10-CM | POA: Diagnosis not present

## 2018-09-09 DIAGNOSIS — Z48812 Encounter for surgical aftercare following surgery on the circulatory system: Secondary | ICD-10-CM | POA: Diagnosis not present

## 2018-09-14 DIAGNOSIS — Z79899 Other long term (current) drug therapy: Secondary | ICD-10-CM | POA: Diagnosis not present

## 2018-09-21 DIAGNOSIS — E119 Type 2 diabetes mellitus without complications: Secondary | ICD-10-CM | POA: Diagnosis not present

## 2018-09-30 DIAGNOSIS — E785 Hyperlipidemia, unspecified: Secondary | ICD-10-CM | POA: Diagnosis not present

## 2018-09-30 DIAGNOSIS — E1165 Type 2 diabetes mellitus with hyperglycemia: Secondary | ICD-10-CM | POA: Diagnosis not present

## 2018-09-30 DIAGNOSIS — I639 Cerebral infarction, unspecified: Secondary | ICD-10-CM | POA: Diagnosis not present

## 2018-09-30 DIAGNOSIS — I214 Non-ST elevation (NSTEMI) myocardial infarction: Secondary | ICD-10-CM | POA: Diagnosis not present

## 2018-10-01 ENCOUNTER — Encounter: Payer: Medicare HMO | Admitting: Internal Medicine

## 2018-10-01 DIAGNOSIS — M797 Fibromyalgia: Secondary | ICD-10-CM | POA: Diagnosis not present

## 2018-10-01 DIAGNOSIS — R609 Edema, unspecified: Secondary | ICD-10-CM | POA: Diagnosis not present

## 2018-10-01 DIAGNOSIS — I25119 Atherosclerotic heart disease of native coronary artery with unspecified angina pectoris: Secondary | ICD-10-CM | POA: Diagnosis not present

## 2018-10-01 DIAGNOSIS — I639 Cerebral infarction, unspecified: Secondary | ICD-10-CM | POA: Diagnosis not present

## 2018-10-07 DIAGNOSIS — N189 Chronic kidney disease, unspecified: Secondary | ICD-10-CM | POA: Diagnosis not present

## 2018-10-08 DIAGNOSIS — I25119 Atherosclerotic heart disease of native coronary artery with unspecified angina pectoris: Secondary | ICD-10-CM | POA: Diagnosis not present

## 2018-10-08 DIAGNOSIS — N182 Chronic kidney disease, stage 2 (mild): Secondary | ICD-10-CM | POA: Diagnosis not present

## 2018-10-08 DIAGNOSIS — I639 Cerebral infarction, unspecified: Secondary | ICD-10-CM | POA: Diagnosis not present

## 2018-10-08 DIAGNOSIS — R609 Edema, unspecified: Secondary | ICD-10-CM | POA: Diagnosis not present

## 2018-10-09 DIAGNOSIS — Z79899 Other long term (current) drug therapy: Secondary | ICD-10-CM | POA: Diagnosis not present

## 2018-10-10 ENCOUNTER — Encounter: Payer: Self-pay | Admitting: Internal Medicine

## 2018-10-10 ENCOUNTER — Ambulatory Visit (INDEPENDENT_AMBULATORY_CARE_PROVIDER_SITE_OTHER): Payer: Medicare HMO | Admitting: Internal Medicine

## 2018-10-10 VITALS — BP 112/60 | HR 92 | Ht 65.0 in | Wt 274.0 lb

## 2018-10-10 DIAGNOSIS — I5032 Chronic diastolic (congestive) heart failure: Secondary | ICD-10-CM | POA: Diagnosis not present

## 2018-10-10 DIAGNOSIS — I441 Atrioventricular block, second degree: Secondary | ICD-10-CM | POA: Diagnosis not present

## 2018-10-10 NOTE — Patient Instructions (Signed)
Medication Instructions:  No change If you need a refill on your cardiac medications before your next appointment, please call your pharmacy.   Lab work: none If you have labs (blood work) drawn today and your tests are completely normal, you will receive your results only by: Marland Kitchen MyChart Message (if you have MyChart) OR . A paper copy in the mail If you have any lab test that is abnormal or we need to change your treatment, we will call you to review the results.  Testing/Procedures: none  Follow-Up: Please schedule overdue follow up with your cardiologist, Dr. Harl Bowie, in [place] office. Remote monitoring is used to monitor your Pacemaker of ICD from home. This monitoring reduces the number of office visits required to check your device to one time per year. It allows [place] to keep an eye on the functioning of your device to ensure it is working properly. You are scheduled for a device check from home on 01/12/19. You may send your transmission at any time that day. If you have a wireless device, the transmission will be sent automatically. After your physician reviews your transmission, you will receive a postcard with your next transmission date. Your physician wants you to follow-up in: 12 months with Dr. Rayann Heman in [place] office You will receive a reminder letter in the mail two months in advance. If you don't receive a letter, please call our office to schedule the follow-up appointment.   Any Other Special Instructions Will Be Listed Below (If Applicable).
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
## 2018-10-11 DIAGNOSIS — R609 Edema, unspecified: Secondary | ICD-10-CM | POA: Diagnosis not present

## 2018-10-11 DIAGNOSIS — N182 Chronic kidney disease, stage 2 (mild): Secondary | ICD-10-CM | POA: Diagnosis not present

## 2018-10-11 DIAGNOSIS — I25119 Atherosclerotic heart disease of native coronary artery with unspecified angina pectoris: Secondary | ICD-10-CM | POA: Diagnosis not present

## 2018-10-11 DIAGNOSIS — Z950 Presence of cardiac pacemaker: Secondary | ICD-10-CM | POA: Diagnosis not present

## 2018-10-13 DIAGNOSIS — Z79899 Other long term (current) drug therapy: Secondary | ICD-10-CM | POA: Diagnosis not present

## 2018-10-13 DIAGNOSIS — D649 Anemia, unspecified: Secondary | ICD-10-CM | POA: Diagnosis not present

## 2018-10-13 LAB — CUP PACEART INCLINIC DEVICE CHECK
Battery Voltage: 3.05 V
Brady Statistic AS VP Percent: 94.18 %
Brady Statistic AS VS Percent: 0.31 %
Date Time Interrogation Session: 20200124192604
Implantable Lead Implant Date: 20191011
Implantable Lead Location: 753860
Implantable Lead Model: 3830
Implantable Lead Model: 5076
Lead Channel Impedance Value: 323 Ohm
Lead Channel Impedance Value: 456 Ohm
Lead Channel Pacing Threshold Amplitude: 0.375 V
Lead Channel Pacing Threshold Amplitude: 0.75 V
Lead Channel Pacing Threshold Pulse Width: 0.4 ms
Lead Channel Sensing Intrinsic Amplitude: 1.5 mV
Lead Channel Sensing Intrinsic Amplitude: 11.5 mV
Lead Channel Sensing Intrinsic Amplitude: 15.75 mV
Lead Channel Setting Sensing Sensitivity: 1.2 mV
MDC IDC LEAD IMPLANT DT: 20191011
MDC IDC LEAD LOCATION: 753859
MDC IDC MSMT BATTERY REMAINING LONGEVITY: 98 mo
MDC IDC MSMT LEADCHNL RA IMPEDANCE VALUE: 361 Ohm
MDC IDC MSMT LEADCHNL RA PACING THRESHOLD PULSEWIDTH: 0.4 ms
MDC IDC MSMT LEADCHNL RA SENSING INTR AMPL: 1.625 mV
MDC IDC MSMT LEADCHNL RV IMPEDANCE VALUE: 361 Ohm
MDC IDC PG IMPLANT DT: 20191011
MDC IDC SET LEADCHNL RA PACING AMPLITUDE: 1.5 V
MDC IDC SET LEADCHNL RV PACING AMPLITUDE: 2.5 V
MDC IDC SET LEADCHNL RV PACING PULSEWIDTH: 1 ms
MDC IDC STAT BRADY AP VP PERCENT: 5.44 %
MDC IDC STAT BRADY AP VS PERCENT: 0.07 %
MDC IDC STAT BRADY RA PERCENT PACED: 5.7 %
MDC IDC STAT BRADY RV PERCENT PACED: 99.61 %

## 2018-10-15 ENCOUNTER — Telehealth: Payer: Self-pay

## 2018-10-15 ENCOUNTER — Institutional Professional Consult (permissible substitution): Payer: Medicare HMO | Admitting: Neurology

## 2018-10-15 DIAGNOSIS — I639 Cerebral infarction, unspecified: Secondary | ICD-10-CM | POA: Diagnosis not present

## 2018-10-15 DIAGNOSIS — I25119 Atherosclerotic heart disease of native coronary artery with unspecified angina pectoris: Secondary | ICD-10-CM | POA: Diagnosis not present

## 2018-10-15 DIAGNOSIS — Z950 Presence of cardiac pacemaker: Secondary | ICD-10-CM | POA: Diagnosis not present

## 2018-10-15 DIAGNOSIS — N182 Chronic kidney disease, stage 2 (mild): Secondary | ICD-10-CM | POA: Diagnosis not present

## 2018-10-15 NOTE — Telephone Encounter (Signed)
Pt did not show for their appt with Dr. Athar today.  

## 2018-10-21 ENCOUNTER — Other Ambulatory Visit: Payer: Self-pay | Admitting: Family

## 2018-10-22 NOTE — Telephone Encounter (Signed)
OV 10/29/18

## 2018-10-23 ENCOUNTER — Ambulatory Visit: Payer: Medicare HMO | Admitting: Family

## 2018-10-23 ENCOUNTER — Telehealth: Payer: Self-pay | Admitting: *Deleted

## 2018-10-23 NOTE — Telephone Encounter (Signed)
VM from pt regarding [employer] not hearing from [place] on refilling her 50 mg Losartan, she will need a [facility] supply to [facility] Please advise on refill Losartan 100 mg is on med list & was last refilled #90 on 07/09/17 Pt's next OV is on 10/29/18

## 2018-10-24 ENCOUNTER — Telehealth: Payer: Self-pay | Admitting: Family

## 2018-10-24 DIAGNOSIS — I509 Heart failure, unspecified: Secondary | ICD-10-CM

## 2018-10-24 DIAGNOSIS — R609 Edema, unspecified: Secondary | ICD-10-CM

## 2018-10-24 MED ORDER — LOSARTAN POTASSIUM 50 MG PO TABS: 50.0000 mg | ORAL_TABLET | Freq: Every day | ORAL | 3 refills | Status: DC

## 2018-10-24 MED ORDER — FUROSEMIDE 20 MG PO TABS: 20.0000 mg | ORAL_TABLET | Freq: Every day | ORAL | 0 refills | Status: DC

## 2018-10-24 NOTE — Telephone Encounter (Signed)
Prescription sent to pharmacy.

## 2018-10-24 NOTE — Telephone Encounter (Signed)
Please send in if appropriate.

## 2018-10-25 NOTE — Telephone Encounter (Signed)
LM on pt phone making aware of prescriptions sent to [facility]

## 2018-10-27 ENCOUNTER — Telehealth: Payer: Self-pay | Admitting: Family

## 2018-10-27 ENCOUNTER — Ambulatory Visit (INDEPENDENT_AMBULATORY_CARE_PROVIDER_SITE_OTHER): Payer: Medicare HMO | Admitting: Family

## 2018-10-27 ENCOUNTER — Encounter: Payer: Self-pay | Admitting: Family

## 2018-10-27 VITALS — BP 104/51 | HR 96 | Temp 97.0°F | Ht 65.0 in | Wt 273.0 lb

## 2018-10-27 DIAGNOSIS — M5442 Lumbago with sciatica, left side: Secondary | ICD-10-CM | POA: Diagnosis not present

## 2018-10-27 DIAGNOSIS — J45909 Unspecified asthma, uncomplicated: Secondary | ICD-10-CM

## 2018-10-27 DIAGNOSIS — Z91199 Patient's noncompliance with other medical treatment and regimen due to unspecified reason: Secondary | ICD-10-CM

## 2018-10-27 DIAGNOSIS — E039 Hypothyroidism, unspecified: Secondary | ICD-10-CM

## 2018-10-27 DIAGNOSIS — E1165 Type 2 diabetes mellitus with hyperglycemia: Secondary | ICD-10-CM

## 2018-10-27 DIAGNOSIS — E1169 Type 2 diabetes mellitus with other specified complication: Secondary | ICD-10-CM

## 2018-10-27 DIAGNOSIS — I10 Essential (primary) hypertension: Secondary | ICD-10-CM | POA: Diagnosis not present

## 2018-10-27 DIAGNOSIS — I152 Hypertension secondary to endocrine disorders: Secondary | ICD-10-CM

## 2018-10-27 DIAGNOSIS — M5441 Lumbago with sciatica, right side: Secondary | ICD-10-CM

## 2018-10-27 DIAGNOSIS — E1159 Type 2 diabetes mellitus with other circulatory complications: Secondary | ICD-10-CM

## 2018-10-27 DIAGNOSIS — I214 Non-ST elevation (NSTEMI) myocardial infarction: Secondary | ICD-10-CM | POA: Diagnosis not present

## 2018-10-27 DIAGNOSIS — G8929 Other chronic pain: Secondary | ICD-10-CM

## 2018-10-27 DIAGNOSIS — F1120 Opioid dependence, uncomplicated: Secondary | ICD-10-CM

## 2018-10-27 DIAGNOSIS — E785 Hyperlipidemia, unspecified: Secondary | ICD-10-CM | POA: Diagnosis not present

## 2018-10-27 DIAGNOSIS — Z9119 Patient's noncompliance with other medical treatment and regimen: Secondary | ICD-10-CM

## 2018-10-27 DIAGNOSIS — F419 Anxiety disorder, unspecified: Secondary | ICD-10-CM

## 2018-10-27 DIAGNOSIS — Z0289 Encounter for other administrative examinations: Secondary | ICD-10-CM

## 2018-10-27 DIAGNOSIS — R5381 Other malaise: Secondary | ICD-10-CM

## 2018-10-27 DIAGNOSIS — IMO0001 Reserved for inherently not codable concepts without codable children: Secondary | ICD-10-CM

## 2018-10-27 DIAGNOSIS — Z09 Encounter for follow-up examination after completed treatment for conditions other than malignant neoplasm: Secondary | ICD-10-CM

## 2018-10-27 DIAGNOSIS — R531 Weakness: Secondary | ICD-10-CM

## 2018-10-27 LAB — BAYER DCA HB A1C WAIVED: HB A1C: 8.8 % — AB (ref <7.0)

## 2018-10-27 MED ORDER — ONDANSETRON HCL 4 MG PO TABS: 4.0000 mg | ORAL_TABLET | Freq: Three times a day (TID) | ORAL | 0 refills | Status: DC | PRN

## 2018-10-27 MED ORDER — HYDROCODONE-ACETAMINOPHEN 7.5-325 MG PO TABS: 1.0000 | ORAL_TABLET | Freq: Two times a day (BID) | ORAL | 0 refills | Status: AC | PRN

## 2018-10-27 MED ORDER — FUROSEMIDE 40 MG PO TABS: 40.0000 mg | ORAL_TABLET | Freq: Three times a day (TID) | ORAL | 1 refills | Status: DC

## 2018-10-27 MED ORDER — LOSARTAN POTASSIUM 50 MG PO TABS: 150.0000 mg | ORAL_TABLET | Freq: Every day | ORAL | 3 refills | Status: DC

## 2018-10-27 MED ORDER — HYDROCODONE-ACETAMINOPHEN 7.5-325 MG PO TABS: 1.0000 | ORAL_TABLET | Freq: Two times a day (BID) | ORAL | 0 refills | Status: DC | PRN

## 2018-10-27 MED ORDER — FERROUS SULFATE 325 (65 FE) MG PO TBEC: 325.0000 mg | DELAYED_RELEASE_TABLET | Freq: Every day | ORAL | 1 refills | Status: AC

## 2018-10-27 MED ORDER — IPRATROPIUM-ALBUTEROL 20-100 MCG/ACT IN AERS: INHALATION_SPRAY | RESPIRATORY_TRACT | 1 refills | Status: AC

## 2018-10-27 MED ORDER — HYDRALAZINE HCL 50 MG PO TABS: 50.0000 mg | ORAL_TABLET | Freq: Three times a day (TID) | ORAL | 1 refills | Status: DC

## 2018-10-27 MED ORDER — METFORMIN HCL 1000 MG PO TABS: 1000.0000 mg | ORAL_TABLET | Freq: Two times a day (BID) | ORAL | 1 refills | Status: AC

## 2018-10-27 MED ORDER — ALPRAZOLAM 0.5 MG PO TABS: 0.5000 mg | ORAL_TABLET | Freq: Every evening | ORAL | 5 refills | Status: AC | PRN

## 2018-10-27 MED ORDER — BLOOD GLUCOSE MONITOR KIT: PACK | 0 refills | Status: DC

## 2018-10-27 MED ORDER — PANTOPRAZOLE SODIUM 40 MG PO TBEC: 40.0000 mg | DELAYED_RELEASE_TABLET | Freq: Every day | ORAL | 1 refills | Status: AC

## 2018-10-27 MED ORDER — ROSUVASTATIN CALCIUM 40 MG PO TABS: 40.0000 mg | ORAL_TABLET | Freq: Every day | ORAL | 1 refills | Status: AC

## 2018-10-27 MED ORDER — GABAPENTIN 100 MG PO CAPS: 100.0000 mg | ORAL_CAPSULE | Freq: Three times a day (TID) | ORAL | 1 refills | Status: DC

## 2018-10-27 MED ORDER — CLOPIDOGREL BISULFATE 75 MG PO TABS: 75.0000 mg | ORAL_TABLET | Freq: Every day | ORAL | Status: DC

## 2018-10-27 NOTE — Addendum Note (Signed)
Addended by: Shelbie Ammons on: 10/27/2018 05:00 PM   Modules accepted: Orders

## 2018-10-27 NOTE — Patient Instructions (Signed)
Deconditioning °Deconditioning refers to the changes in your body that occur during a period of inactivity. The changes happen in your heart, lungs, and muscles. They decrease your ability to be active, and they make you feel tired and weak. °There are three stages of deconditioning: °· Mild deconditioning. At this stage, you will notice a change in your ability to do your usual exercise activities, such as running, biking, or swimming. °· Moderate deconditioning. At this stage, you will notice a change in your ability to do normal everyday activities, such as walking, grocery shopping, and doing chores. °· Severe deconditioning. At this stage, you will notice a change in your ability to do minimal activity or normal self-care. °Deconditioning can occur after only a few days of inactivity. The longer the period of inactivity, the more severe the deconditioning will be, and the longer it will take to return to your previous level of functioning. °What are the causes? °Deconditioning is often caused by inactivity due to: °· Illnesses, such as cancer, stroke, heart attack, fibromyalgia, and chronic fatigue syndrome. °· Injuries, especially back injuries, broken bones, and ligament and tendon injuries. °· A long stay in the hospital. °· Pregnancy, especially if long periods of bed rest are needed. °What increases the risk? °This condition is more likely to develop in: °· People who are hospitalized. °· People on bed rest. °· People who are obese. °· People with poor nutrition. °· Elderly adults. °· People with injuries or illnesses that interfere with movement and activity. °What are the signs or symptoms? °Symptoms of deconditioning include: °· Weakness. °· Tiredness. °· Shortness of breath with minor exertion. °· A faster-than-normal heartbeat. You may not notice this without taking your pulse. °· Pain or discomfort with activity. °· Decreased strength. °· Decreased sense of balance. °· Decreased  endurance. °· Difficulty doing your usual forms of exercise. °· Difficulty doing activities of daily living, such as grocery shopping or chores. °· Difficulty walking around the house and doing basic self-care, such as getting to the bathroom, preparing meals, or doing laundry. °How is this diagnosed? °Deconditioning is diagnosed based on your medical history and a physical exam. During the physical exam, your health care provider will check for signs of deconditioning, such as: °· Decreased size of muscles. °· Decreased strength. °· Trouble with balance. °· Shortness of breath or abnormally increased heart rate after minor exertion. °How is this treated? °Treatment for deconditioning usually involves following a structured exercise program in which activity is increased gradually. Your health care provider will determine which exercises are right for you. The exercise program will likely include aerobic exercise and strength training: °· Aerobic exercise helps improve the functioning of the heart and lungs as well as the muscles. °· Strength training helps improve muscle size and strength. °Both of these types of exercise will improve your endurance. You may be referred to a physical therapist who can create a safe strengthening program for you to follow. °Follow these instructions at home: °· Follow the exercise program that is recommended by your health care provider or physical therapist. °· Do not increase your exercise any faster than directed. °· Eat a healthy diet. °· Do not use any products that contain nicotine or tobacco, such as cigarettes and e-cigarettes. If you need help quitting, ask your health care provider. °· Take over-the-counter and prescription medicines only as told by your health care provider. °· Keep all follow-up visits as told by your health care provider. This is important. °Contact a   health care provider if: °· You are not able to carry out the prescribed exercise program. °· You are  becoming more and more fatigued and weak. °· You become light-headed when rising to a sitting or standing position. °· Your level of endurance decreases after it has improved. °Get help right away if: °· You have chest pain. °· You are very short of breath. °· You have any episodes of passing out. °This information is not intended to replace advice given to you by your health care provider. Make sure you discuss any questions you have with your health care provider. °Document Released: 01/18/2014 Document Revised: 03/23/2016 Document Reviewed: 12/03/2015 °Elsevier Interactive Patient Education © 2019 [employer] ° °

## 2018-10-27 NOTE — Progress Notes (Signed)
Subjective:    Patient ID: Mary Raymond, female    DOB: 14-Feb-1951, 68 y.o.   MRN: [id number]  HPI Chief Complaint  Patient presents with  . pain management  . Diabetes    recheck  . referral for physical therapy   PT presents to the office today for chronic follow up. She had a NSTEM on 06/05/18. She was hospitalized and discharged to [facility]. She was discharged about two weeks later, then had weakness and bradycardia. 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  She was discharged on 07/01/18 to [facility]. She was discharged from [facility] 10/17/18.  Diabetes  She presents for her follow-up diabetic visit. She has type 2 diabetes mellitus. Her disease course has been stable. Hypoglycemia symptoms include nervousness/anxiousness. Associated symptoms include foot paresthesias. Pertinent negatives for diabetes include no blurred vision and no visual change. Pertinent negatives for hypoglycemia complications include no hospitalization and no nocturnal hypoglycemia. Symptoms are stable. Diabetic complications include a CVA, heart disease and peripheral neuropathy. Risk factors for coronary artery disease include dyslipidemia, diabetes mellitus, hypertension, sedentary lifestyle and post-menopausal. She is following a generally unhealthy diet. Her overall blood glucose range is 180-200 mg/dl.  Hypertension  This is a chronic problem. The current episode started more than 1 year ago. The problem has been resolved since onset. The problem is controlled. Associated symptoms include anxiety, malaise/fatigue, peripheral edema and shortness of breath. Pertinent negatives include no blurred vision. Risk factors for coronary artery disease include dyslipidemia, diabetes mellitus, obesity and sedentary lifestyle. The current treatment provides moderate improvement. Hypertensive end-organ damage includes CAD/MI and CVA.  Hyperlipidemia  This is a chronic problem. The current episode  started more than 1 year ago. Exacerbating diseases include obesity. Associated symptoms include shortness of breath. Current antihyperlipidemic treatment includes statins. The current treatment provides moderate improvement of lipids. Risk factors for coronary artery disease include dyslipidemia, diabetes mellitus, hypertension, female sex and a sedentary lifestyle.  Asthma  She complains of shortness of breath. There is no cough or wheezing. This is a chronic problem. The current episode started more than 1 year ago. The problem occurs intermittently. The problem has been waxing and waning. Associated symptoms include malaise/fatigue. She reports moderate improvement on treatment. Her past medical history is significant for asthma.  Back Pain  This is a chronic problem. The current episode started more than 1 year ago. The problem occurs intermittently. The problem has been waxing and waning since onset. The pain is present in the lumbar spine. The quality of the pain is described as aching. The pain is at a severity of 10/10. The pain is moderate.  Anxiety  Presents for follow-up visit. Symptoms include depressed mood, excessive worry, irritability, nervous/anxious behavior and shortness of breath. Symptoms occur occasionally.   Her past medical history is significant for asthma.  Depression         This is a chronic problem.  The current episode started more than 1 year ago.   The onset quality is gradual.   The problem occurs intermittently.  Associated symptoms include helplessness, hopelessness, irritable, decreased interest and sad.  Past medical history includes anxiety.      Review of Systems Review of Systems  Constitutional: Positive for irritability and malaise/fatigue.  Eyes: Negative for blurred vision.  Respiratory: Positive for shortness of breath. Negative for cough and wheezing.   Musculoskeletal: Positive for back pain.  Psychiatric/Behavioral: Positive for depression. The  patient is nervous/anxious.   All other  systems reviewed and are negative.    Objective:   Physical Exam Objective   Physical Exam Vitals signs reviewed.  Constitutional:      General: She is irritable. She is not in acute distress.    Appearance: She is well-developed.  HENT:     Head: Normocephalic and atraumatic.     Right Ear: Tympanic membrane normal.     Left Ear: Tympanic membrane normal.  Eyes:     Pupils: Pupils are equal, round, and reactive to light.  Neck:     Musculoskeletal: Normal range of motion and neck supple.     Thyroid: No thyromegaly.  Cardiovascular:     Rate and Rhythm: Normal rate and regular rhythm.     Heart sounds: Normal heart sounds. No murmur.  Pulmonary:     Effort: Pulmonary effort is normal. No respiratory distress.     Breath sounds: Normal breath sounds. No wheezing.  Abdominal:     General: Bowel sounds are normal. There is no distension.     Palpations: Abdomen is soft.     Tenderness: There is no abdominal tenderness.  Musculoskeletal:        General: No tenderness.     Right lower leg: Edema (2+) present.     Left lower leg: Edema (2+ ) present.     Comments: Generalized weakness, in wheelchair  Skin:    General: Skin is warm and dry.  Neurological:     Mental Status: She is alert and oriented to person, place, and time.     Cranial Nerves: No cranial nerve deficit.     Deep Tendon Reflexes: Reflexes are normal and symmetric.  Psychiatric:        Behavior: Behavior normal.        Thought Content: Thought content normal.        Judgment: Judgment normal.       BP (!) 104/51   Pulse 96   Temp (!) 97 F (36.1 C)   Ht '5\' 5"'$  (1.651 m)   Wt 273 lb (123.8 kg)   BMI 45.43 kg/m        Assessment & Plan:  Mary Raymond comes in today with chief complaint of pain management; Diabetes (recheck); and referral for physical therapy   Diagnosis and orders addressed:  1. Hypertension associated with diabetes ([place]) -  CMP14+EGFR - CBC with Differential/Platelet  2. NSTEMI (non-ST elevated myocardial infarction) ([place]), 05/2018 - CMP14+EGFR - CBC with Differential/Platelet - Ambulatory referral to Physical Therapy  3. Uncomplicated asthma, unspecified asthma severity, unspecified whether persistent - CMP14+EGFR - CBC with Differential/Platelet  4. Hypothyroidism, unspecified type - CMP14+EGFR - CBC with Differential/Platelet - TSH  5. Hyperlipidemia associated with type 2 diabetes mellitus (HCC) - CMP14+EGFR - CBC with Differential/Platelet - Lipid panel  6. Diabetes mellitus type 2, uncontrolled, without complications (HCC) - SMO70+BEML - CBC with Differential/Platelet - Bayer DCA Hb A1c Waived  7. Obesity, morbid, BMI 40.0-49.9 (HCC) - CMP14+EGFR - CBC with Differential/Platelet  8. Chronic bilateral low back pain with bilateral sciatica - CMP14+EGFR - CBC with Differential/Platelet - HYDROcodone-acetaminophen (NORCO) 7.5-325 MG tablet; Take 1 tablet by mouth every 12 (twelve) hours as needed for moderate pain.  Dispense: 60 tablet; Refill: 0 - HYDROcodone-acetaminophen (NORCO) 7.5-325 MG tablet; Take 1 tablet by mouth every 12 (twelve) hours as needed for moderate pain. Do not fill until 60 days from prescription date  Dispense: 60 tablet; Refill: 0 - HYDROcodone-acetaminophen (NORCO) 7.5-325 MG tablet; Take 1 tablet by  mouth every 12 (twelve) hours as needed for moderate pain. Do not fill until 30 days from prescription date  Dispense: 60 tablet; Refill: 0 - Ambulatory referral to Physical Therapy  9. Uncomplicated opioid dependence (HCC) - CMP14+EGFR - CBC with Differential/Platelet - HYDROcodone-acetaminophen (NORCO) 7.5-325 MG tablet; Take 1 tablet by mouth every 12 (twelve) hours as needed for moderate pain.  Dispense: 60 tablet; Refill: 0 - HYDROcodone-acetaminophen (NORCO) 7.5-325 MG tablet; Take 1 tablet by mouth every 12 (twelve) hours as needed for moderate pain. Do not fill  until 60 days from prescription date  Dispense: 60 tablet; Refill: 0 - HYDROcodone-acetaminophen (NORCO) 7.5-325 MG tablet; Take 1 tablet by mouth every 12 (twelve) hours as needed for moderate pain. Do not fill until 30 days from prescription date  Dispense: 60 tablet; Refill: 0  10. Pain medication agreement signed - CMP14+EGFR - CBC with Differential/Platelet - HYDROcodone-acetaminophen (NORCO) 7.5-325 MG tablet; Take 1 tablet by mouth every 12 (twelve) hours as needed for moderate pain.  Dispense: 60 tablet; Refill: 0 - HYDROcodone-acetaminophen (NORCO) 7.5-325 MG tablet; Take 1 tablet by mouth every 12 (twelve) hours as needed for moderate pain. Do not fill until 60 days from prescription date  Dispense: 60 tablet; Refill: 0 - HYDROcodone-acetaminophen (NORCO) 7.5-325 MG tablet; Take 1 tablet by mouth every 12 (twelve) hours as needed for moderate pain. Do not fill until 30 days from prescription date  Dispense: 60 tablet; Refill: 0  11. Noncompliance - CMP14+EGFR - CBC with Differential/Platelet  12. Anxiety - CMP14+EGFR - CBC with Differential/Platelet - ALPRAZolam (XANAX) 0.5 MG tablet; Take 1 tablet (0.5 mg total) by mouth at bedtime as needed for anxiety. TAKE 1 TABLET 2 TIMES A DAY AS NEEDED FOR ANXIETY  Dispense: 30 tablet; Refill: 5  13. Hospital discharge follow-up  14. General weakness - Ambulatory referral to Physical Therapy  15. Physical deconditioning   Labs pending Health Maintenance reviewed Diet and exercise encouraged  Follow up plan: 3 months    Evelina Dun, FNP

## 2018-10-27 NOTE — Addendum Note (Signed)
Addended by: Evelina Dun A on: 10/27/2018 04:29 PM   Modules accepted: Orders

## 2018-10-27 NOTE — Addendum Note (Signed)
Addended by: Shelbie Ammons on: 10/27/2018 04:31 PM   Modules accepted: Orders

## 2018-10-27 NOTE — Telephone Encounter (Signed)
Patient called stating she needs a refill on losartan 100mg  takes 150 mg daily

## 2018-10-28 ENCOUNTER — Other Ambulatory Visit: Payer: Self-pay | Admitting: Family

## 2018-10-28 ENCOUNTER — Other Ambulatory Visit: Payer: Self-pay | Admitting: *Deleted

## 2018-10-28 DIAGNOSIS — IMO0001 Reserved for inherently not codable concepts without codable children: Secondary | ICD-10-CM

## 2018-10-28 DIAGNOSIS — R7989 Other specified abnormal findings of blood chemistry: Secondary | ICD-10-CM

## 2018-10-28 DIAGNOSIS — E1165 Type 2 diabetes mellitus with hyperglycemia: Principal | ICD-10-CM

## 2018-10-28 LAB — CMP14+EGFR
ALT: 12 IU/L (ref 0–32)
AST: 11 IU/L (ref 0–40)
Albumin/Globulin Ratio: 1.7 (ref 1.2–2.2)
Albumin: 4.3 g/dL (ref 3.8–4.8)
Alkaline Phosphatase: 102 IU/L (ref 39–117)
BUN/Creatinine Ratio: 30 — ABNORMAL HIGH (ref 12–28)
BUN: 60 mg/dL — ABNORMAL HIGH (ref 8–27)
Bilirubin Total: 0.2 mg/dL (ref 0.0–1.2)
CO2: 23 mmol/L (ref 20–29)
Calcium: 10.2 mg/dL (ref 8.7–10.3)
Chloride: 97 mmol/L (ref 96–106)
Creatinine, Ser: 1.99 mg/dL — ABNORMAL HIGH (ref 0.57–1.00)
GFR calc Af Amer: 29 mL/min/{1.73_m2} — ABNORMAL LOW (ref >59)
GFR calc non Af Amer: 25 mL/min/{1.73_m2} — ABNORMAL LOW (ref >59)
Globulin, Total: 2.6 g/dL (ref 1.5–4.5)
Glucose: 213 mg/dL — ABNORMAL HIGH (ref 65–99)
Potassium: 4.4 mmol/L (ref 3.5–5.2)
Sodium: 141 mmol/L (ref 134–144)
Total Protein: 6.9 g/dL (ref 6.0–8.5)

## 2018-10-28 LAB — CBC WITH DIFFERENTIAL/PLATELET
BASOS ABS: 0 10*3/uL (ref 0.0–0.2)
Basos: 0 %
EOS (ABSOLUTE): 0.1 10*3/uL (ref 0.0–0.4)
Eos: 0 %
HEMOGLOBIN: 9.4 g/dL — AB (ref 11.1–15.9)
Hematocrit: 30.6 % — ABNORMAL LOW (ref 34.0–46.6)
Immature Grans (Abs): 0.2 10*3/uL — ABNORMAL HIGH (ref 0.0–0.1)
Immature Granulocytes: 1 %
Lymphocytes Absolute: 2.1 10*3/uL (ref 0.7–3.1)
Lymphs: 14 %
MCH: 24.7 pg — ABNORMAL LOW (ref 26.6–33.0)
MCHC: 30.7 g/dL — ABNORMAL LOW (ref 31.5–35.7)
MCV: 80 fL (ref 79–97)
Monocytes Absolute: 0.8 10*3/uL (ref 0.1–0.9)
Monocytes: 6 %
Neutrophils Absolute: 11.7 10*3/uL — ABNORMAL HIGH (ref 1.4–7.0)
Neutrophils: 79 %
Platelets: 256 10*3/uL (ref 150–450)
RBC: 3.81 x10E6/uL (ref 3.77–5.28)
RDW: 16.2 % — ABNORMAL HIGH (ref 11.7–15.4)
WBC: 14.9 10*3/uL — ABNORMAL HIGH (ref 3.4–10.8)

## 2018-10-28 LAB — TSH: TSH: 1.67 u[IU]/mL (ref 0.450–4.500)

## 2018-10-28 LAB — LIPID PANEL
CHOLESTEROL TOTAL: 120 mg/dL (ref 100–199)
Chol/HDL Ratio: 2.7 ratio (ref 0.0–4.4)
HDL: 45 mg/dL (ref >39)
LDL Calculated: 25 mg/dL (ref 0–99)
Triglycerides: 250 mg/dL — ABNORMAL HIGH (ref 0–149)
VLDL Cholesterol Cal: 50 mg/dL — ABNORMAL HIGH (ref 5–40)

## 2018-10-28 MED ORDER — SEMAGLUTIDE(0.25 OR 0.5MG/DOS) 2 MG/1.5ML ~~LOC~~ SOPN: PEN_INJECTOR | SUBCUTANEOUS | 1 refills | Status: AC

## 2018-10-28 MED ORDER — BLOOD GLUCOSE MONITOR KIT: PACK | 0 refills | Status: AC

## 2018-10-29 ENCOUNTER — Ambulatory Visit: Payer: Medicare HMO | Admitting: Adult Health

## 2018-10-29 ENCOUNTER — Ambulatory Visit: Payer: Medicare HMO | Admitting: Family

## 2018-11-05 ENCOUNTER — Encounter: Payer: Medicare HMO | Admitting: Internal Medicine

## 2018-11-19 ENCOUNTER — Other Ambulatory Visit: Payer: Self-pay | Admitting: Family

## 2018-11-19 DIAGNOSIS — E039 Hypothyroidism, unspecified: Secondary | ICD-10-CM

## 2018-11-19 NOTE — Telephone Encounter (Signed)
Was seen in February for chronic health issues. Please advise on refills.

## 2018-11-20 MED ORDER — CLOPIDOGREL BISULFATE 75 MG PO TABS: 75.0000 mg | ORAL_TABLET | Freq: Every day | ORAL | 1 refills | Status: AC

## 2018-11-20 MED ORDER — GABAPENTIN 100 MG PO CAPS: 100.0000 mg | ORAL_CAPSULE | Freq: Three times a day (TID) | ORAL | 1 refills | Status: AC

## 2018-11-20 MED ORDER — LEVOTHYROXINE SODIUM 50 MCG PO TABS: ORAL_TABLET | ORAL | 3 refills | Status: AC

## 2018-11-20 NOTE — Telephone Encounter (Signed)
Patient aware refills have been sent to pharmacy

## 2018-11-20 NOTE — Telephone Encounter (Signed)
Prescriptions sent to pharmacy

## 2018-11-24 ENCOUNTER — Other Ambulatory Visit: Payer: Self-pay | Admitting: *Deleted

## 2018-11-24 MED ORDER — GLUCOSE BLOOD VI STRP: ORAL_STRIP | 3 refills | Status: AC

## 2018-11-27 ENCOUNTER — Ambulatory Visit: Payer: Self-pay | Admitting: Adult Health

## 2018-11-27 ENCOUNTER — Encounter: Payer: Self-pay | Admitting: Adult Health
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
## 2018-11-28 ENCOUNTER — Telehealth: Payer: Self-pay

## 2018-11-28 NOTE — Telephone Encounter (Signed)
Pt was given a afternoon appt per facility. Pt not a morning person. Pt was a no show today appt was at 1245pm.

## 2018-12-02 ENCOUNTER — Ambulatory Visit: Payer: Medicare HMO | Admitting: Cardiology

## 2018-12-04 ENCOUNTER — Other Ambulatory Visit: Payer: Self-pay | Admitting: Family

## 2018-12-08 ENCOUNTER — Emergency Department (HOSPITAL_COMMUNITY): Payer: Medicare HMO

## 2018-12-08 ENCOUNTER — Other Ambulatory Visit: Payer: Self-pay

## 2018-12-08 ENCOUNTER — Inpatient Hospital Stay (HOSPITAL_COMMUNITY)
Admission: EM | Admit: 2018-12-08 | Discharge: 2018-12-17 | DRG: 291 | Disposition: A | Discharge status: Home / Self Care | Payer: Medicare HMO | Attending: Internal Medicine | Admitting: Internal Medicine

## 2018-12-08 ENCOUNTER — Encounter (HOSPITAL_COMMUNITY): Payer: Self-pay | Admitting: Emergency Medicine

## 2018-12-08 DIAGNOSIS — M797 Fibromyalgia: Secondary | ICD-10-CM | POA: Diagnosis present

## 2018-12-08 DIAGNOSIS — Z825 Family history of asthma and other chronic lower respiratory diseases: Secondary | ICD-10-CM

## 2018-12-08 DIAGNOSIS — H538 Other visual disturbances: Secondary | ICD-10-CM | POA: Diagnosis present

## 2018-12-08 DIAGNOSIS — I252 Old myocardial infarction: Secondary | ICD-10-CM | POA: Diagnosis not present

## 2018-12-08 DIAGNOSIS — R627 Adult failure to thrive: Secondary | ICD-10-CM

## 2018-12-08 DIAGNOSIS — E1142 Type 2 diabetes mellitus with diabetic polyneuropathy: Secondary | ICD-10-CM | POA: Diagnosis present

## 2018-12-08 DIAGNOSIS — I5033 Acute on chronic diastolic (congestive) heart failure: Secondary | ICD-10-CM | POA: Diagnosis present

## 2018-12-08 DIAGNOSIS — E785 Hyperlipidemia, unspecified: Secondary | ICD-10-CM | POA: Diagnosis present

## 2018-12-08 DIAGNOSIS — D631 Anemia in chronic kidney disease: Secondary | ICD-10-CM | POA: Diagnosis present

## 2018-12-08 DIAGNOSIS — R7989 Other specified abnormal findings of blood chemistry: Secondary | ICD-10-CM

## 2018-12-08 DIAGNOSIS — Z794 Long term (current) use of insulin: Secondary | ICD-10-CM | POA: Diagnosis not present

## 2018-12-08 DIAGNOSIS — E559 Vitamin D deficiency, unspecified: Secondary | ICD-10-CM | POA: Diagnosis present

## 2018-12-08 DIAGNOSIS — Z6841 Body Mass Index (BMI) 40.0 and over, adult: Secondary | ICD-10-CM | POA: Diagnosis not present

## 2018-12-08 DIAGNOSIS — G8929 Other chronic pain: Secondary | ICD-10-CM | POA: Diagnosis not present

## 2018-12-08 DIAGNOSIS — F419 Anxiety disorder, unspecified: Secondary | ICD-10-CM | POA: Diagnosis not present

## 2018-12-08 DIAGNOSIS — J45909 Unspecified asthma, uncomplicated: Secondary | ICD-10-CM | POA: Diagnosis present

## 2018-12-08 DIAGNOSIS — I083 Combined rheumatic disorders of mitral, aortic and tricuspid valves: Secondary | ICD-10-CM | POA: Diagnosis present

## 2018-12-08 DIAGNOSIS — I441 Atrioventricular block, second degree: Secondary | ICD-10-CM | POA: Diagnosis present

## 2018-12-08 DIAGNOSIS — E1165 Type 2 diabetes mellitus with hyperglycemia: Secondary | ICD-10-CM | POA: Diagnosis not present

## 2018-12-08 DIAGNOSIS — R05 Cough: Secondary | ICD-10-CM | POA: Diagnosis not present

## 2018-12-08 DIAGNOSIS — M545 Low back pain: Secondary | ICD-10-CM | POA: Diagnosis present

## 2018-12-08 DIAGNOSIS — Z955 Presence of coronary angioplasty implant and graft: Secondary | ICD-10-CM

## 2018-12-08 DIAGNOSIS — Z7982 Long term (current) use of aspirin: Secondary | ICD-10-CM

## 2018-12-08 DIAGNOSIS — Z79899 Other long term (current) drug therapy: Secondary | ICD-10-CM

## 2018-12-08 DIAGNOSIS — I249 Acute ischemic heart disease, unspecified: Secondary | ICD-10-CM | POA: Diagnosis present

## 2018-12-08 DIAGNOSIS — K219 Gastro-esophageal reflux disease without esophagitis: Secondary | ICD-10-CM | POA: Diagnosis not present

## 2018-12-08 DIAGNOSIS — E1159 Type 2 diabetes mellitus with other circulatory complications: Secondary | ICD-10-CM | POA: Diagnosis not present

## 2018-12-08 DIAGNOSIS — I130 Hypertensive heart and chronic kidney disease with heart failure and stage 1 through stage 4 chronic kidney disease, or unspecified chronic kidney disease: Secondary | ICD-10-CM | POA: Diagnosis not present

## 2018-12-08 DIAGNOSIS — Z79890 Hormone replacement therapy (postmenopausal): Secondary | ICD-10-CM

## 2018-12-08 DIAGNOSIS — I361 Nonrheumatic tricuspid (valve) insufficiency: Secondary | ICD-10-CM | POA: Diagnosis not present

## 2018-12-08 DIAGNOSIS — Z79891 Long term (current) use of opiate analgesic: Secondary | ICD-10-CM

## 2018-12-08 DIAGNOSIS — Z803 Family history of malignant neoplasm of breast: Secondary | ICD-10-CM

## 2018-12-08 DIAGNOSIS — Z950 Presence of cardiac pacemaker: Secondary | ICD-10-CM | POA: Diagnosis not present

## 2018-12-08 DIAGNOSIS — R0902 Hypoxemia: Secondary | ICD-10-CM | POA: Diagnosis not present

## 2018-12-08 DIAGNOSIS — E1122 Type 2 diabetes mellitus with diabetic chronic kidney disease: Secondary | ICD-10-CM | POA: Diagnosis not present

## 2018-12-08 DIAGNOSIS — Z823 Family history of stroke: Secondary | ICD-10-CM

## 2018-12-08 DIAGNOSIS — E119 Type 2 diabetes mellitus without complications: Secondary | ICD-10-CM

## 2018-12-08 DIAGNOSIS — I509 Heart failure, unspecified: Secondary | ICD-10-CM | POA: Diagnosis not present

## 2018-12-08 DIAGNOSIS — I25110 Atherosclerotic heart disease of native coronary artery with unstable angina pectoris: Secondary | ICD-10-CM | POA: Diagnosis present

## 2018-12-08 DIAGNOSIS — E039 Hypothyroidism, unspecified: Secondary | ICD-10-CM | POA: Diagnosis present

## 2018-12-08 DIAGNOSIS — N184 Chronic kidney disease, stage 4 (severe): Secondary | ICD-10-CM | POA: Diagnosis not present

## 2018-12-08 DIAGNOSIS — Z888 Allergy status to other drugs, medicaments and biological substances status: Secondary | ICD-10-CM | POA: Diagnosis not present

## 2018-12-08 DIAGNOSIS — M1990 Unspecified osteoarthritis, unspecified site: Secondary | ICD-10-CM | POA: Diagnosis present

## 2018-12-08 DIAGNOSIS — Z833 Family history of diabetes mellitus: Secondary | ICD-10-CM

## 2018-12-08 DIAGNOSIS — E1169 Type 2 diabetes mellitus with other specified complication: Secondary | ICD-10-CM | POA: Diagnosis not present

## 2018-12-08 DIAGNOSIS — Z90710 Acquired absence of both cervix and uterus: Secondary | ICD-10-CM

## 2018-12-08 DIAGNOSIS — I200 Unstable angina: Secondary | ICD-10-CM

## 2018-12-08 DIAGNOSIS — Z8249 Family history of ischemic heart disease and other diseases of the circulatory system: Secondary | ICD-10-CM

## 2018-12-08 DIAGNOSIS — I340 Nonrheumatic mitral (valve) insufficiency: Secondary | ICD-10-CM | POA: Diagnosis not present

## 2018-12-08 DIAGNOSIS — I110 Hypertensive heart disease with heart failure: Secondary | ICD-10-CM | POA: Diagnosis not present

## 2018-12-08 DIAGNOSIS — R52 Pain, unspecified: Secondary | ICD-10-CM | POA: Diagnosis not present

## 2018-12-08 DIAGNOSIS — R778 Other specified abnormalities of plasma proteins: Secondary | ICD-10-CM | POA: Diagnosis not present

## 2018-12-08 DIAGNOSIS — R0602 Shortness of breath: Secondary | ICD-10-CM | POA: Diagnosis not present

## 2018-12-08 DIAGNOSIS — I447 Left bundle-branch block, unspecified: Secondary | ICD-10-CM | POA: Diagnosis present

## 2018-12-08 DIAGNOSIS — R069 Unspecified abnormalities of breathing: Secondary | ICD-10-CM | POA: Diagnosis not present

## 2018-12-08 DIAGNOSIS — IMO0001 Reserved for inherently not codable concepts without codable children: Secondary | ICD-10-CM

## 2018-12-08 DIAGNOSIS — D649 Anemia, unspecified: Secondary | ICD-10-CM | POA: Diagnosis present

## 2018-12-08 DIAGNOSIS — Z800 Family history of malignant neoplasm of digestive organs: Secondary | ICD-10-CM

## 2018-12-08 DIAGNOSIS — I69398 Other sequelae of cerebral infarction: Secondary | ICD-10-CM

## 2018-12-08 LAB — CBC WITH DIFFERENTIAL/PLATELET
Abs Immature Granulocytes: 0.21 10*3/uL — ABNORMAL HIGH (ref 0.00–0.07)
Basophils Absolute: 0.1 10*3/uL (ref 0.0–0.1)
Basophils Relative: 0 %
Eosinophils Absolute: 0.1 10*3/uL (ref 0.0–0.5)
Eosinophils Relative: 1 %
HEMATOCRIT: 29.5 % — AB (ref 36.0–46.0)
HEMOGLOBIN: 8.7 g/dL — AB (ref 12.0–15.0)
Immature Granulocytes: 2 %
LYMPHS ABS: 2.3 10*3/uL (ref 0.7–4.0)
Lymphocytes Relative: 16 %
MCH: 24.4 pg — ABNORMAL LOW (ref 26.0–34.0)
MCHC: 29.5 g/dL — ABNORMAL LOW (ref 30.0–36.0)
MCV: 82.9 fL (ref 80.0–100.0)
MONOS PCT: 6 %
Monocytes Absolute: 0.8 10*3/uL (ref 0.1–1.0)
Neutro Abs: 10.6 10*3/uL — ABNORMAL HIGH (ref 1.7–7.7)
Neutrophils Relative %: 75 %
Platelets: 262 10*3/uL (ref 150–400)
RBC: 3.56 MIL/uL — ABNORMAL LOW (ref 3.87–5.11)
RDW: 15.9 % — ABNORMAL HIGH (ref 11.5–15.5)
WBC: 14.1 10*3/uL — ABNORMAL HIGH (ref 4.0–10.5)
nRBC: 0 % (ref 0.0–0.2)

## 2018-12-08 LAB — BASIC METABOLIC PANEL
Anion gap: 12 (ref 5–15)
BUN: 42 mg/dL — ABNORMAL HIGH (ref 8–23)
CO2: 22 mmol/L (ref 22–32)
Calcium: 9.2 mg/dL (ref 8.9–10.3)
Chloride: 105 mmol/L (ref 98–111)
Creatinine, Ser: 1.88 mg/dL — ABNORMAL HIGH (ref 0.44–1.00)
GFR calc Af Amer: 31 mL/min — ABNORMAL LOW (ref >60)
GFR calc non Af Amer: 27 mL/min — ABNORMAL LOW (ref >60)
Glucose, Bld: 214 mg/dL — ABNORMAL HIGH (ref 70–99)
Potassium: 3.9 mmol/L (ref 3.5–5.1)
Sodium: 139 mmol/L (ref 135–145)

## 2018-12-08 LAB — BRAIN NATRIURETIC PEPTIDE: B Natriuretic Peptide: 249 pg/mL — ABNORMAL HIGH (ref 0.0–100.0)

## 2018-12-08 LAB — TROPONIN I: Troponin I: 0.34 ng/mL (ref <0.03)

## 2018-12-08 LAB — MAGNESIUM: Magnesium: 1.8 mg/dL (ref 1.7–2.4)

## 2018-12-08 MED ORDER — ACETAMINOPHEN 650 MG RE SUPP: 650.0000 mg | Freq: Four times a day (QID) | RECTAL | Status: DC | PRN

## 2018-12-08 MED ORDER — ONDANSETRON HCL 4 MG/2ML IJ SOLN: 4.0000 mg | Freq: Four times a day (QID) | INTRAMUSCULAR | Status: DC | PRN

## 2018-12-08 MED ORDER — ONDANSETRON HCL 4 MG/2ML IJ SOLN
4.0000 mg | Freq: Once | INTRAMUSCULAR | Status: AC
  Administered 2018-12-08: 4 mg via INTRAVENOUS
  Filled 2018-12-08: qty 2

## 2018-12-08 MED ORDER — FUROSEMIDE 10 MG/ML IJ SOLN
40.0000 mg | Freq: Once | INTRAMUSCULAR | Status: AC
  Administered 2018-12-08: 40 mg via INTRAVENOUS
  Filled 2018-12-08: qty 4

## 2018-12-08 MED ORDER — ASPIRIN EC 81 MG PO TBEC
81.0000 mg | DELAYED_RELEASE_TABLET | Freq: Every day | ORAL | Status: DC
  Administered 2018-12-09 – 2018-12-17 (×9): 81 mg via ORAL
  Filled 2018-12-08 (×9): qty 1

## 2018-12-08 MED ORDER — ACETAMINOPHEN 325 MG PO TABS: 650.0000 mg | ORAL_TABLET | Freq: Four times a day (QID) | ORAL | Status: DC | PRN

## 2018-12-08 MED ORDER — MORPHINE SULFATE (PF) 4 MG/ML IV SOLN
4.0000 mg | INTRAVENOUS | Status: DC | PRN
  Administered 2018-12-08: 4 mg via INTRAVENOUS
  Filled 2018-12-08: qty 1

## 2018-12-08 MED ORDER — ACETAMINOPHEN 325 MG PO TABS
650.0000 mg | ORAL_TABLET | ORAL | Status: DC | PRN
  Administered 2018-12-09: 650 mg via ORAL
  Filled 2018-12-08: qty 2

## 2018-12-08 MED ORDER — HEPARIN (PORCINE) 25000 UT/250ML-% IV SOLN
1400.0000 [IU]/h | INTRAVENOUS | Status: DC
  Administered 2018-12-08: 1050 [IU]/h via INTRAVENOUS
  Filled 2018-12-08: qty 250

## 2018-12-08 MED ORDER — HEPARIN BOLUS VIA INFUSION
4000.0000 [IU] | Freq: Once | INTRAVENOUS | Status: AC
  Administered 2018-12-08: 4000 [IU] via INTRAVENOUS

## 2018-12-08 MED ORDER — INSULIN ASPART 100 UNIT/ML ~~LOC~~ SOLN
0.0000 [IU] | Freq: Every day | SUBCUTANEOUS | Status: DC
  Administered 2018-12-09: 2 [IU] via SUBCUTANEOUS
  Administered 2018-12-10: 4 [IU] via SUBCUTANEOUS
  Administered 2018-12-12: 5 [IU] via SUBCUTANEOUS
  Administered 2018-12-13: 4 [IU] via SUBCUTANEOUS
  Administered 2018-12-14: 5 [IU] via SUBCUTANEOUS

## 2018-12-08 MED ORDER — INSULIN ASPART 100 UNIT/ML ~~LOC~~ SOLN
0.0000 [IU] | Freq: Three times a day (TID) | SUBCUTANEOUS | Status: DC
  Administered 2018-12-09 (×3): 4 [IU] via SUBCUTANEOUS
  Administered 2018-12-10: 11 [IU] via SUBCUTANEOUS
  Administered 2018-12-10: 7 [IU] via SUBCUTANEOUS
  Administered 2018-12-10: 11 [IU] via SUBCUTANEOUS
  Administered 2018-12-11 (×2): 20 [IU] via SUBCUTANEOUS
  Administered 2018-12-11 – 2018-12-13 (×6): 15 [IU] via SUBCUTANEOUS
  Administered 2018-12-13: 7 [IU] via SUBCUTANEOUS
  Administered 2018-12-14 (×3): 15 [IU] via SUBCUTANEOUS
  Administered 2018-12-15: 20 [IU] via SUBCUTANEOUS
  Administered 2018-12-15: 15 [IU] via SUBCUTANEOUS

## 2018-12-08 MED ORDER — NITROGLYCERIN 0.4 MG SL SUBL: 0.4000 mg | SUBLINGUAL_TABLET | SUBLINGUAL | Status: DC | PRN

## 2018-12-08 NOTE — ED Notes (Signed)
Report given to [facility] 
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
## 2018-12-08 NOTE — ED Notes (Signed)
CRITICAL VALUE ALERT  Critical Value:  Troponin 0.34  Date & Time Notied:  12/08/2018 2030  Provider Notified: Dr. Melina Copa  Orders Received/Actions taken: na
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
## 2018-12-08 NOTE — ED Notes (Signed)
Verified with pharmacy that they are paging the on call pharmacist

## 2018-12-08 NOTE — ED Triage Notes (Addendum)
Patient complains of shortness of breathe worse than normal for the past 2 weeks. CBG 282 on scene. Pacemaker in place since a stemi last year. Bilateral nonpitting lower extremity edema. Patient also complains of a cough but is unable to expel any phlegm.
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
## 2018-12-09 ENCOUNTER — Inpatient Hospital Stay (HOSPITAL_COMMUNITY): Payer: Medicare HMO

## 2018-12-09 DIAGNOSIS — I249 Acute ischemic heart disease, unspecified: Secondary | ICD-10-CM

## 2018-12-09 DIAGNOSIS — I361 Nonrheumatic tricuspid (valve) insufficiency: Secondary | ICD-10-CM

## 2018-12-09 DIAGNOSIS — I340 Nonrheumatic mitral (valve) insufficiency: Secondary | ICD-10-CM

## 2018-12-09 DIAGNOSIS — I509 Heart failure, unspecified: Secondary | ICD-10-CM

## 2018-12-09 LAB — GLUCOSE, CAPILLARY
GLUCOSE-CAPILLARY: 241 mg/dL — AB (ref 70–99)
Glucose-Capillary: 153 mg/dL — ABNORMAL HIGH (ref 70–99)
Glucose-Capillary: 154 mg/dL — ABNORMAL HIGH (ref 70–99)
Glucose-Capillary: 182 mg/dL — ABNORMAL HIGH (ref 70–99)
Glucose-Capillary: 191 mg/dL — ABNORMAL HIGH (ref 70–99)

## 2018-12-09 LAB — HEMOGLOBIN AND HEMATOCRIT, BLOOD
HCT: 28.5 % — ABNORMAL LOW (ref 36.0–46.0)
Hemoglobin: 8.4 g/dL — ABNORMAL LOW (ref 12.0–15.0)

## 2018-12-09 LAB — CBC
HCT: 27.6 % — ABNORMAL LOW (ref 36.0–46.0)
Hemoglobin: 8.1 g/dL — ABNORMAL LOW (ref 12.0–15.0)
MCH: 23.9 pg — ABNORMAL LOW (ref 26.0–34.0)
MCHC: 29.3 g/dL — ABNORMAL LOW (ref 30.0–36.0)
MCV: 81.4 fL (ref 80.0–100.0)
Platelets: 243 10*3/uL (ref 150–400)
RBC: 3.39 MIL/uL — ABNORMAL LOW (ref 3.87–5.11)
RDW: 15.8 % — AB (ref 11.5–15.5)
WBC: 15.5 10*3/uL — ABNORMAL HIGH (ref 4.0–10.5)
nRBC: 0 % (ref 0.0–0.2)

## 2018-12-09 LAB — HEPARIN LEVEL (UNFRACTIONATED): Heparin Unfractionated: <0.1 IU/mL — ABNORMAL LOW (ref 0.30–0.70)

## 2018-12-09 LAB — TROPONIN I
Troponin I: 0.33 ng/mL (ref <0.03)
Troponin I: 0.35 ng/mL (ref <0.03)

## 2018-12-09 LAB — ECHOCARDIOGRAM COMPLETE
Height: 65 in
Weight: 4433.89 oz

## 2018-12-09 LAB — MRSA PCR SCREENING: MRSA by PCR: NEGATIVE

## 2018-12-09 MED ORDER — TRAMADOL HCL 50 MG PO TABS
50.0000 mg | ORAL_TABLET | Freq: Four times a day (QID) | ORAL | Status: DC | PRN
  Administered 2018-12-11: 50 mg via ORAL
  Filled 2018-12-09: qty 1

## 2018-12-09 MED ORDER — ACETAMINOPHEN 325 MG PO TABS
650.0000 mg | ORAL_TABLET | Freq: Four times a day (QID) | ORAL | Status: DC | PRN
  Administered 2018-12-15 – 2018-12-17 (×2): 650 mg via ORAL
  Filled 2018-12-09 (×2): qty 2

## 2018-12-09 MED ORDER — IPRATROPIUM-ALBUTEROL 0.5-2.5 (3) MG/3ML IN SOLN: 3.0000 mL | RESPIRATORY_TRACT | Status: DC | PRN

## 2018-12-09 MED ORDER — ALPRAZOLAM 0.5 MG PO TABS
0.5000 mg | ORAL_TABLET | Freq: Every evening | ORAL | Status: DC | PRN
  Administered 2018-12-09 – 2018-12-16 (×10): 0.5 mg via ORAL
  Filled 2018-12-09 (×9): qty 1

## 2018-12-09 MED ORDER — LEVOTHYROXINE SODIUM 50 MCG PO TABS
50.0000 ug | ORAL_TABLET | Freq: Every day | ORAL | Status: DC
  Administered 2018-12-09 – 2018-12-17 (×8): 50 ug via ORAL
  Filled 2018-12-09 (×8): qty 1

## 2018-12-09 MED ORDER — IPRATROPIUM-ALBUTEROL 20-100 MCG/ACT IN AERS: 1.0000 | INHALATION_SPRAY | Freq: Four times a day (QID) | RESPIRATORY_TRACT | Status: DC | PRN

## 2018-12-09 MED ORDER — CLOPIDOGREL BISULFATE 75 MG PO TABS
75.0000 mg | ORAL_TABLET | Freq: Every day | ORAL | Status: DC
  Administered 2018-12-09 – 2018-12-17 (×9): 75 mg via ORAL
  Filled 2018-12-09 (×9): qty 1

## 2018-12-09 MED ORDER — INSULIN GLARGINE 100 UNIT/ML ~~LOC~~ SOLN
10.0000 [IU] | Freq: Every day | SUBCUTANEOUS | Status: DC
  Administered 2018-12-09 – 2018-12-10 (×2): 10 [IU] via SUBCUTANEOUS
  Filled 2018-12-09 (×2): qty 0.1

## 2018-12-09 MED ORDER — ORAL CARE MOUTH RINSE
15.0000 mL | Freq: Two times a day (BID) | OROMUCOSAL | Status: DC
  Administered 2018-12-09 – 2018-12-17 (×7): 15 mL via OROMUCOSAL

## 2018-12-09 MED ORDER — IPRATROPIUM-ALBUTEROL 0.5-2.5 (3) MG/3ML IN SOLN: 3.0000 mL | Freq: Four times a day (QID) | RESPIRATORY_TRACT | Status: DC | PRN

## 2018-12-09 MED ORDER — PANTOPRAZOLE SODIUM 40 MG PO TBEC
40.0000 mg | DELAYED_RELEASE_TABLET | Freq: Every day | ORAL | Status: DC
  Administered 2018-12-09 – 2018-12-17 (×9): 40 mg via ORAL
  Filled 2018-12-09 (×9): qty 1

## 2018-12-09 MED ORDER — FUROSEMIDE 10 MG/ML IJ SOLN
40.0000 mg | Freq: Two times a day (BID) | INTRAMUSCULAR | Status: DC
  Administered 2018-12-09 – 2018-12-11 (×5): 40 mg via INTRAVENOUS
  Filled 2018-12-09 (×5): qty 4

## 2018-12-09 MED ORDER — MAGNESIUM SULFATE 2 GM/50ML IV SOLN
2.0000 g | Freq: Once | INTRAVENOUS | Status: AC
  Administered 2018-12-09: 2 g via INTRAVENOUS
  Filled 2018-12-09: qty 50

## 2018-12-09 MED ORDER — HEPARIN BOLUS VIA INFUSION
3000.0000 [IU] | Freq: Once | INTRAVENOUS | Status: AC
  Administered 2018-12-09: 3000 [IU] via INTRAVENOUS
  Filled 2018-12-09: qty 3000

## 2018-12-09 MED ORDER — HYDROCODONE-ACETAMINOPHEN 5-325 MG PO TABS
1.0000 | ORAL_TABLET | Freq: Four times a day (QID) | ORAL | Status: DC | PRN
  Administered 2018-12-09 (×2): 2 via ORAL
  Administered 2018-12-10: 1 via ORAL
  Administered 2018-12-10 – 2018-12-11 (×3): 2 via ORAL
  Administered 2018-12-12 – 2018-12-16 (×3): 1 via ORAL
  Filled 2018-12-09: qty 2
  Filled 2018-12-09 (×3): qty 1
  Filled 2018-12-09: qty 2
  Filled 2018-12-09: qty 1
  Filled 2018-12-09 (×3): qty 2

## 2018-12-09 MED ORDER — ACYCLOVIR 400 MG PO TABS
400.0000 mg | ORAL_TABLET | Freq: Every day | ORAL | Status: DC
  Administered 2018-12-09 – 2018-12-17 (×9): 400 mg via ORAL
  Filled 2018-12-09 (×9): qty 1

## 2018-12-09 MED ORDER — MONTELUKAST SODIUM 10 MG PO TABS
10.0000 mg | ORAL_TABLET | Freq: Every day | ORAL | Status: DC
  Administered 2018-12-09 – 2018-12-16 (×9): 10 mg via ORAL
  Filled 2018-12-09 (×9): qty 1

## 2018-12-09 MED ORDER — GABAPENTIN 100 MG PO CAPS
100.0000 mg | ORAL_CAPSULE | Freq: Three times a day (TID) | ORAL | Status: DC
  Administered 2018-12-09 – 2018-12-17 (×25): 100 mg via ORAL
  Filled 2018-12-09 (×27): qty 1

## 2018-12-09 MED ORDER — HEPARIN SODIUM (PORCINE) 5000 UNIT/ML IJ SOLN
5000.0000 [IU] | Freq: Three times a day (TID) | INTRAMUSCULAR | Status: DC
  Administered 2018-12-09 – 2018-12-17 (×23): 5000 [IU] via SUBCUTANEOUS
  Filled 2018-12-09 (×24): qty 1

## 2018-12-09 MED ORDER — ROSUVASTATIN CALCIUM 20 MG PO TABS
40.0000 mg | ORAL_TABLET | Freq: Every day | ORAL | Status: DC
  Administered 2018-12-09 – 2018-12-17 (×9): 40 mg via ORAL
  Filled 2018-12-09 (×9): qty 2

## 2018-12-09 MED ORDER — MORPHINE SULFATE (PF) 2 MG/ML IV SOLN: 2.0000 mg | INTRAVENOUS | Status: DC | PRN

## 2018-12-09 MED ORDER — LOSARTAN POTASSIUM 50 MG PO TABS
150.0000 mg | ORAL_TABLET | Freq: Every day | ORAL | Status: DC
  Administered 2018-12-09: 150 mg via ORAL
  Filled 2018-12-09: qty 3

## 2018-12-09 NOTE — Progress Notes (Signed)
Inpatient Diabetes Program Recommendations  AACE/ADA: New Consensus Statement on Inpatient Glycemic Control (2015)  Target Ranges:  Prepandial:   less than 140 mg/dL      Peak postprandial:   less than 180 mg/dL (1-2 hours)      Critically ill patients:  140 - 180 mg/dL   Lab Results  Component Value Date   GLUCAP 182 (H) 12/09/2018   HGBA1C 8.8 (H) 10/27/2018    Review of Glycemic Control Results for JOSCELYNE, RENVILLE (MRN [id number]) as of 12/09/2018 09:51  Ref. Range 12/09/2018 00:35 12/09/2018 06:23  Glucose-Capillary Latest Ref Range: 70 - 99 mg/dL 191 (H) 182 (H)   Diabetes history: DM 2 Outpatient Diabetes medications:  Lantus 28 units bid, Metformin 1000 mg bid, Ozempic 0.25 mg weekly Current orders for Inpatient glycemic control:  Novolog resistant tid with meals and HS Lantus 10 units daily Inpatient Diabetes Program Recommendations:     Agree with current orders.  Will follow.   Thanks,  Adah Perl, RN, BC-ADM Inpatient Diabetes Coordinator Pager [phone] (8a-5p)
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
## 2018-12-09 NOTE — Progress Notes (Signed)
CRITICAL VALUE ALERT  Critical Value: Troponin 0.33  Date & Time Notied: 12/09/2018 0436  Provider Notified: Lamar Blinks  Orders Received/Actions taken: Awaiting

## 2018-12-09 NOTE — Progress Notes (Addendum)
CARDIOLOGY FOLLOW-UP NOTE    Extensive chart review, personally reviewed prior digital images from September, reviewed surgical and interventional cardiology notes.  Turned down for surgery due to low functional capacity and other significant comorbidities.  Had atherectomy and stenting of RCA in 2 sites.  Left coronary anatomy including left main/circumflex is high risk and felt to be a poor target for percutaneous intervention (medical therapy recommended).  Subsequent permanent pacemaker for high-grade AV block.  Other comorbidities include opiate dependence, obesity, sedentary lifestyle with poor functional capacity, pontine stroke, CKD stage IV, type 2 diabetes, anxiety disorder, hypertension, and chronic diastolic heart failure.  Admitted with 2-week history of increasing shortness of breath and elevated flat troponin trend.  The patient continues on dual antiplatelet therapy, furosemide, losartan as her cardiovascular therapy.  Currently, vital signs are stable.  Hemoglobin is 8.4.  IMPRESSION: Dyspnea may be an ischemic equivalent since she has significant residual circumflex and left main disease.  She is not a surgical candidate.  She was not felt to be a reasonable candidate for high risk intervention of the left main circumflex due to anatomy.  Current goal is adjustment in medical regimen.  RECOMMENDATIONS: Investigate developing significant anemia.  Consider DC IV heparin.  Add long-acting nitrates to treat possible ischemia based dyspnea.  I do not believe repeat ischemic evaluation or heart catheterization is indicated currently.  We will see what echo and further laboratory testing reveals.  Overall poor prognosis in a patient with multiple significant comorbidities.

## 2018-12-09 NOTE — TOC Progression Note (Signed)
Transition of Care [facility]) - Progression Note    Patient Details  Name: Mary Raymond MRN: [id number] Date of Birth: January 05, 1951  Transition of Care [facility]) CM/SW [place], [place] Phone Number: 12/09/2018, 1:44 PM  Clinical Narrative:     CSW unable to complete assessment at this time, the patient was sleep.       Expected Discharge Plan and Services                                     Social Determinants of Health (SDOH) Interventions    Readmission Risk Interventions No flowsheet data found.

## 2018-12-09 NOTE — Progress Notes (Addendum)
[place] TEAM 1 - Stepdown/ICU TEAM  Mary Raymond  KDX:[id number] DOB: [birth year] DOA: 12/08/2018 [name], FNP    Brief Narrative:  [phone] w/ a hx of anxiety, osteoarthritis, asthma, chronic back pain, CAD, diabetic peripheral neuropathy, DM2, fibromyalgia, GERD, hyperlipidemia, hypertension, hypothyroidism, migraine headaches, CAD, obesity, occipital CVA, and vitamin D deficiency who presented w/ chest pressure and SOB along w/ lower extremity edema and orthopnea.    Significant Events: 3/23 admit   Subjective: The patient denies significant chest pain or shortness of breath when resting in bed.  She reports more "congestion" which she feels is "stuck in her chest that she cannot get out."  She denies fevers or chills.  She denies abdominal pain nausea vomiting or headache.  Assessment & Plan:  Chest pain - Multivessel CAD s/p DES x 2 to the RCA with residual unrevascularized LM and LCx disease Not felt to be a candidate for CABG due to very limited functional state - Cards to determine extent of further eval this hospitalization    Recent Labs  Lab 12/08/18 1939 12/09/18 0220 12/09/18 0718  TROPONINI 0.34* 0.33* 0.35*    Dyspnea - Volume overload EF 60-65% via TTE Sept 19 - CXR c/w pulmonary edema - baseline wgt ~123kg, but at times as low as 118 - diurese - follow renal fxn   Filed Weights   12/08/18 1911 12/08/18 2359 12/09/18 0500  Weight: 121.6 kg 126.3 kg 125.7 kg    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 Rayann Heman  CKD Stage 3 Baseline crt appears to range from 1.25-1.99 - currently within that range - follow crt closely w/ diuresis   Hypothyroidism Continue levothyroxine  Hyperlipidemia Continue rosuvastatin  DM2 CBG not at goal - adjust tx and follow  Anxiety Continue alprazolam   GERD Cont Protonix  Normocytic Anemia Secondary to iron deficiency - baseline Hgb ~9.0 as of Feb 2020 - Fe studies as of  Sept 2019 noted signif Fe deficiency - recheck Fe studies and consider IV Fe - investigate extent of prior w/u   Obesity - Body mass index is 46.11 kg/m.   DVT prophylaxis: IV heparin  Code Status: FULL CODE Family Communication: no family present at time of exam  Disposition Plan: cardiac tele appropriate   Consultants:  Cardiology  Antimicrobials:  none   Objective: Blood pressure 135/68, pulse 82, temperature 97.7 F (36.5 C), temperature source Oral, resp. rate (!) 22, height 5\' 5"  (1.651 m), weight 125.7 kg, SpO2 97 %.  Intake/Output Summary (Last 24 hours) at 12/09/2018 0844 Last data filed at 12/09/2018 0837 Gross per 24 hour  Intake 140.01 ml  Output 875 ml  Net -734.99 ml   Filed Weights   12/08/18 1911 12/08/18 2359 12/09/18 0500  Weight: 121.6 kg 126.3 kg 125.7 kg    Examination: General: No acute respiratory distress Lungs: faint diffuse crackles - no wheezing  Cardiovascular: Regular rate and rhythm without murmur gallop or rub normal S1 and S2 Abdomen: Nontender, obese, soft, bowel sounds positive, no rebound, no ascites, no appreciable mass Extremities: 1+ B LE edema - no cyanosis   CBC: Recent Labs  Lab 12/08/18 1939 12/09/18 0220 12/09/18 0718  WBC 14.1* 15.5*  --   NEUTROABS 10.6*  --   --   HGB 8.7* 8.1* 8.4*  HCT 29.5* 27.6* 28.5*  MCV 82.9 81.4  --   PLT 262 243  --    Basic Metabolic Panel: Recent Labs  Lab 12/08/18 1939  NA 139  K 3.9  CL 105  CO2 22  GLUCOSE 214*  BUN 42*  CREATININE 1.88*  CALCIUM 9.2  MG 1.8   GFR: Estimated Creatinine Clearance: 38.7 mL/min (A) (by C-G formula based on SCr of 1.88 mg/dL (H)).  Liver Function Tests: No results for input(s): AST, ALT, ALKPHOS, BILITOT, PROT, ALBUMIN in the last 168 hours. No results for input(s): LIPASE, AMYLASE in the last 168 hours. No results for input(s): AMMONIA in the last 168 hours.  Coagulation Profile: No results for input(s): INR, PROTIME in the last 168  hours.  Cardiac Enzymes: Recent Labs  Lab 12/08/18 1939 12/09/18 0220 12/09/18 0718  TROPONINI 0.34* 0.33* 0.35*    HbA1C: HB A1C (BAYER DCA - WAIVED)  Date/Time Value Ref Range Status  10/27/2018 11:33 AM 8.8 (H) <7.0 % Final    Comment:                                          Diabetic Adult            <7.0                                       Healthy Adult        4.3 - 5.7                                                           (DCCT/NGSP) American Diabetes Association's Summary of Glycemic Recommendations for Adults with Diabetes: Hemoglobin A1c <7.0%. More stringent glycemic goals (A1c <6.0%) may further reduce complications at the cost of increased risk of hypoglycemia.   02/25/2018 10:33 AM 8.3 (H) <7.0 % Final    Comment:                                          Diabetic Adult            <7.0                                       Healthy Adult        4.3 - 5.7                                                           (DCCT/NGSP) American Diabetes Association's Summary of Glycemic Recommendations for Adults with Diabetes: Hemoglobin A1c <7.0%. More stringent glycemic goals (A1c <6.0%) may further reduce complications at the cost of increased risk of hypoglycemia.    Hgb A1c MFr Bld  Date/Time Value Ref Range Status  06/05/2018 07:47 PM 7.3 (H) 4.8 - 5.6 % Final    Comment:    (NOTE) Pre diabetes:  5.7%-6.4% Diabetes:              >6.4% Glycemic control for   <7.0% adults with diabetes     CBG: Recent Labs  Lab 12/09/18 0035 12/09/18 0623  GLUCAP 191* 182*    Recent Results (from the past 240 hour(s))  MRSA PCR Screening     Status: None   Collection Time: 12/08/18 11:58 PM  Result Value Ref Range Status   MRSA by PCR NEGATIVE NEGATIVE Final    Comment:        The GeneXpert MRSA Assay (FDA approved for NASAL specimens only), is one component of a comprehensive MRSA colonization surveillance program. It is not intended to diagnose MRSA  infection nor to guide or monitor treatment for MRSA infections. Performed at [facility], [place] [address] [place], [place] [address]      Scheduled Meds: . acyclovir  400 mg Oral Daily  . aspirin EC  81 mg Oral Daily  . clopidogrel  75 mg Oral Daily  . furosemide  40 mg Intravenous BID  . gabapentin  100 mg Oral TID  . insulin aspart  0-20 Units Subcutaneous TID WC  . insulin aspart  0-5 Units Subcutaneous QHS  . levothyroxine  50 mcg Oral Q0600  . losartan  150 mg Oral Daily  . mouth rinse  15 mL Mouth Rinse BID  . montelukast  10 mg Oral QHS  . pantoprazole  40 mg Oral Daily  . rosuvastatin  40 mg Oral Daily   Continuous Infusions: . heparin 1,400 Units/hr (12/09/18 0641)     LOS: 1 day   Cherene Altes, MD [facility] Office  [phone] Pager - Text Page per Amion  If 7PM-7AM, please contact night-coverage per Amion 12/09/2018, 8:44 AM

## 2018-12-09 NOTE — Progress Notes (Signed)
ANTICOAGULATION CONSULT NOTE - Follow Up Consult  Pharmacy Consult for heparin Indication: chest pain/ACS  Labs: Recent Labs    12/08/18 1939 12/09/18 0220 12/09/18 0517  HGB 8.7* 8.1*  --   HCT 29.5* 27.6*  --   PLT 262 243  --   HEPARINUNFRC  --   --  <0.10*  CREATININE 1.88*  --   --   TROPONINI 0.34* 0.33*  --     Assessment: 67yo female subtherapeutic on heparin with initial dosing for CP; no gtt issues or signs of bleeding per RN.  Goal of Therapy:  Heparin level 0.3-0.7 units/ml   Plan:  Will rebolus with heparin 3000 units and increase heparin gtt by 4 units/kgABW/hr to 1400 units/hr and check level in 8 hours.    Wynona Neat, PharmD, BCPS  12/09/2018,6:39 AM

## 2018-12-09 NOTE — Progress Notes (Signed)
Due to the Coronavirus restrictions and departmental instructions, we are not completing Advance Directives at this time.  Chaplain Dr Redgie Grayer

## 2018-12-09 NOTE — Care Management (Signed)
TOC received consult to discuss medicaid with pt.  CM spoke with pt; pt informed CM that she has active medicaid however receive an letter in the mail standing that it would end 3/31.  CM informed pt that she needs to follow up with DSS directly to inquire about her medicaid.

## 2018-12-09 NOTE — Progress Notes (Signed)
  Echocardiogram 2D Echocardiogram has been performed.  Mary Raymond 12/09/2018, 11:44 AM

## 2018-12-10 DIAGNOSIS — I509 Heart failure, unspecified: Secondary | ICD-10-CM

## 2018-12-10 DIAGNOSIS — Z794 Long term (current) use of insulin: Secondary | ICD-10-CM

## 2018-12-10 DIAGNOSIS — I5033 Acute on chronic diastolic (congestive) heart failure: Secondary | ICD-10-CM | POA: Insufficient documentation

## 2018-12-10 DIAGNOSIS — R7989 Other specified abnormal findings of blood chemistry: Secondary | ICD-10-CM

## 2018-12-10 DIAGNOSIS — K219 Gastro-esophageal reflux disease without esophagitis: Secondary | ICD-10-CM

## 2018-12-10 DIAGNOSIS — E1169 Type 2 diabetes mellitus with other specified complication: Secondary | ICD-10-CM

## 2018-12-10 DIAGNOSIS — E785 Hyperlipidemia, unspecified: Secondary | ICD-10-CM

## 2018-12-10 DIAGNOSIS — E1159 Type 2 diabetes mellitus with other circulatory complications: Secondary | ICD-10-CM

## 2018-12-10 LAB — GLUCOSE, CAPILLARY
Glucose-Capillary: 204 mg/dL — ABNORMAL HIGH (ref 70–99)
Glucose-Capillary: 276 mg/dL — ABNORMAL HIGH (ref 70–99)
Glucose-Capillary: 259 mg/dL — ABNORMAL HIGH (ref 70–99)
Glucose-Capillary: 317 mg/dL — ABNORMAL HIGH (ref 70–99)

## 2018-12-10 LAB — COMPREHENSIVE METABOLIC PANEL
ALT: 14 U/L (ref 0–44)
AST: 14 U/L — ABNORMAL LOW (ref 15–41)
Albumin: 2.9 g/dL — ABNORMAL LOW (ref 3.5–5.0)
Alkaline Phosphatase: 94 U/L (ref 38–126)
Anion gap: 9 (ref 5–15)
BUN: 40 mg/dL — ABNORMAL HIGH (ref 8–23)
CHLORIDE: 95 mmol/L — AB (ref 98–111)
CO2: 30 mmol/L (ref 22–32)
Calcium: 9 mg/dL (ref 8.9–10.3)
Creatinine, Ser: 2.47 mg/dL — ABNORMAL HIGH (ref 0.44–1.00)
GFR calc Af Amer: 23 mL/min — ABNORMAL LOW (ref >60)
GFR, EST NON AFRICAN AMERICAN: 20 mL/min — AB (ref >60)
Glucose, Bld: 239 mg/dL — ABNORMAL HIGH (ref 70–99)
Potassium: 3.9 mmol/L (ref 3.5–5.1)
Sodium: 134 mmol/L — ABNORMAL LOW (ref 135–145)
Total Bilirubin: 0.6 mg/dL (ref 0.3–1.2)
Total Protein: 6.9 g/dL (ref 6.5–8.1)

## 2018-12-10 LAB — CBC
HEMATOCRIT: 28.9 % — AB (ref 36.0–46.0)
Hemoglobin: 8.4 g/dL — ABNORMAL LOW (ref 12.0–15.0)
MCH: 24 pg — ABNORMAL LOW (ref 26.0–34.0)
MCHC: 29.1 g/dL — ABNORMAL LOW (ref 30.0–36.0)
MCV: 82.6 fL (ref 80.0–100.0)
Platelets: 265 10*3/uL (ref 150–400)
RBC: 3.5 MIL/uL — ABNORMAL LOW (ref 3.87–5.11)
RDW: 16.1 % — ABNORMAL HIGH (ref 11.5–15.5)
WBC: 13.9 10*3/uL — AB (ref 4.0–10.5)
nRBC: 0 % (ref 0.0–0.2)

## 2018-12-10 LAB — IRON AND TIBC
Iron: 40 ug/dL (ref 28–170)
Saturation Ratios: 11 % (ref 10.4–31.8)
TIBC: 374 ug/dL (ref 250–450)
UIBC: 334 ug/dL

## 2018-12-10 LAB — VITAMIN B12: Vitamin B-12: 262 pg/mL (ref 180–914)

## 2018-12-10 LAB — RETICULOCYTES
Immature Retic Fract: 27.6 % — ABNORMAL HIGH (ref 2.3–15.9)
RBC.: 3.5 MIL/uL — ABNORMAL LOW (ref 3.87–5.11)
Retic Count, Absolute: 64.1 10*3/uL (ref 19.0–186.0)
Retic Ct Pct: 1.8 % (ref 0.4–3.1)

## 2018-12-10 LAB — FERRITIN: Ferritin: 13 ng/mL (ref 11–307)

## 2018-12-10 LAB — FOLATE: Folate: 13.6 ng/mL (ref >5.9)

## 2018-12-10 MED ORDER — INSULIN GLARGINE 100 UNIT/ML ~~LOC~~ SOLN
15.0000 [IU] | Freq: Every day | SUBCUTANEOUS | Status: DC
  Administered 2018-12-11: 15 [IU] via SUBCUTANEOUS
  Filled 2018-12-10: qty 0.15

## 2018-12-10 MED ORDER — LOSARTAN POTASSIUM 50 MG PO TABS
150.0000 mg | ORAL_TABLET | Freq: Every day | ORAL | Status: DC
  Administered 2018-12-11: 150 mg via ORAL
  Filled 2018-12-10: qty 3

## 2018-12-10 NOTE — Progress Notes (Signed)
Inpatient Diabetes Program Recommendations  AACE/ADA: New Consensus Statement on Inpatient Glycemic Control (2015)  Target Ranges:  Prepandial:   less than 140 mg/dL      Peak postprandial:   less than 180 mg/dL (1-2 hours)      Critically ill patients:  140 - 180 mg/dL   Lab Results  Component Value Date   GLUCAP 204 (H) 12/10/2018   HGBA1C 8.8 (H) 10/27/2018   Diabetes history: DM 2 Outpatient Diabetes medications:  Lantus 28 units bid, Metformin 1000 mg bid, Ozempic 0.25 mg weekly Current orders for Inpatient glycemic control:  Novolog resistant tid with meals and HS Lantus 10 units daily Inpatient Diabetes Program Recommendations:   Please increase Lantus to 15 units bid.  Thanks,  Adah Perl, RN, BC-ADM Inpatient Diabetes Coordinator Pager [phone] (8a-5p)

## 2018-12-10 NOTE — TOC Initial Note (Signed)
Transition of Care [facility]) - Initial/Assessment Note    Patient Details  Name: TYTEANNA OST MRN: [id number] Date of Birth: 1951/04/11  Transition of Care [facility]) CM/SW Contact:    Maryclare Labrador, RN Phone Number: 12/10/2018, 4:14 PM  Clinical Narrative:     PTA independent from home with daughter.  Pt has PCP and denied barriers with paying for medciations and transportation.  Pt informed CM that she utilizes a walker in the home.  PT eval orderd              Expected Discharge Plan: [place]     Patient Goals and CMS Choice Patient states their goals for this hospitalization and ongoing recovery are:: To be able to breathe and walk      Expected Discharge Plan and Services Expected Discharge Plan: [place] arrangements for the past 2 months: Single Family Home                          Prior Living Arrangements/Services Living arrangements for the past 2 months: Single Family Home Lives with:: Adult Children Patient language and need for interpreter reviewed:: Yes Do you feel safe going back to the place where you live?: Yes            Criminal Activity/Legal Involvement Pertinent to Current Situation/Hospitalization: No - Comment as needed  Activities of Daily Living Home Assistive Devices/Equipment: Walker (specify type)(front wheel walker) ADL Screening (condition at time of admission) Patient's cognitive ability adequate to safely complete daily activities?: Yes Is the patient deaf or have difficulty hearing?: No Does the patient have difficulty seeing, even when wearing glasses/contacts?: No Does the patient have difficulty concentrating, remembering, or making decisions?: No Patient able to express need for assistance with ADLs?: Yes Does the patient have difficulty dressing or bathing?: No Independently performs ADLs?: No Communication: Independent Dressing (OT): Independent Grooming:  Independent Feeding: Independent Bathing: Needs assistance Is this a change from baseline?: Pre-admission baseline Toileting: Needs assistance Is this a change from baseline?: Change from baseline, expected to last <3 days In/Out Bed: Needs assistance Is this a change from baseline?: Change from baseline, expected to last <3 days Walks in Home: Needs assistance Is this a change from baseline?: Pre-admission baseline Does the patient have difficulty walking or climbing stairs?: Yes Weakness of Legs: Both Weakness of Arms/Hands: None  Permission Sought/Granted Permission sought to share information with : Case Manager                Emotional Assessment   Attitude/Demeanor/Rapport: Engaged, Gracious Affect (typically observed): Accepting Orientation: : Oriented to Self, Oriented to Place, Oriented to  Time, Oriented to Situation   Psych Involvement: No (comment)  Admission diagnosis:  Unstable angina ([place]) [I20.0] Troponin level elevated [R79.89] Acute on chronic congestive heart failure, unspecified heart failure type [facility]) [I50.9] Patient Active Problem List   Diagnosis Date Noted  . Acute on chronic congestive heart failure ([place])   . Acute coronary syndrome ([place]) 12/08/2018  . Anemia 12/08/2018  . Deep venous thrombosis ([place])   . Cardiac device in situ   . Cerebral embolism with cerebral infarction 06/09/2018  . NSTEMI (non-ST elevated myocardial infarction) ([place]), 05/2018   . Chest pain 06/05/2018  . Troponin level elevated 06/05/2018  . Noncompliance 11/05/2017  . Genital herpes 08/02/2016  . Pain medication agreement signed 05/01/2016  . Uncomplicated opioid dependence (  [place]) 05/01/2016  . Chronic back pain 08/10/2015  . 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 Depression 11/15/2014  . GERD (gastroesophageal reflux disease) 11/15/2014  . Hypertension associated with diabetes ([place])   . Hyperlipidemia associated with type 2 diabetes mellitus  ([place])   . Type II diabetes mellitus ([place])   . Anxiety   . Obesity, morbid, BMI 40.0-49.9 ([place])   . Allergy   . Asthma   . Vitamin D deficiency   . Neuropathy   . Hypothyroidism 02/25/2013   PCP:  [name], FNP Pharmacy:   [place], [place] [address] Phone: [phone] Fax: [phone]  [facility] [place], [place] [address] Phone: [phone] Fax: [phone]  [id number] - [place], [place] [address] Phone: [phone] Fax: [phone]     Social Determinants of Health (SDOH) Interventions    Readmission Risk Interventions Readmission Risk Prevention Plan 12/10/2018  Transportation Screening Complete  Medication Review ([place]) Complete  PCP or Specialist appointment within 3-5 days of discharge Complete  HRI or [place] Complete  SW Recovery Care/Counseling Consult Complete  [place] Patient Refused  Some recent data might be hidden

## 2018-12-10 NOTE — Progress Notes (Addendum)
Progress Note  Patient Name: Mary Raymond Date of Encounter: 12/10/2018  Primary Cardiologist: Carlyle Dolly, MD   Subjective   Overall feels better.  Able to lie flat without dyspnea.  She feels diuresis has helped.  BUN and creatinine have moderately increased since admission.  Inpatient Medications    Scheduled Meds:  acyclovir  400 mg Oral Daily   aspirin EC  81 mg Oral Daily   clopidogrel  75 mg Oral Daily   furosemide  40 mg Intravenous BID   gabapentin  100 mg Oral TID   heparin injection (subcutaneous)  5,000 Units Subcutaneous Q8H   insulin aspart  0-20 Units Subcutaneous TID WC   insulin aspart  0-5 Units Subcutaneous QHS   [START ON 12/11/2018] insulin glargine  15 Units Subcutaneous Daily   levothyroxine  50 mcg Oral Q0600   [START ON 12/11/2018] losartan  150 mg Oral Daily   mouth rinse  15 mL Mouth Rinse BID   montelukast  10 mg Oral QHS   pantoprazole  40 mg Oral Daily   rosuvastatin  40 mg Oral Daily   Continuous Infusions:  PRN Meds: acetaminophen, ALPRAZolam, HYDROcodone-acetaminophen, ipratropium-albuterol, morphine injection, nitroGLYCERIN, ondansetron (ZOFRAN) IV, traMADol   Vital Signs    Vitals:   12/10/18 0858 12/10/18 0900 12/10/18 1000 12/10/18 1106  BP: (!) 126/53   (!) 110/56  Pulse: 72 80 85 74  Resp: (!) 22 (!) 24 (!) 25 14  Temp:    98.1 F (36.7 C)  TempSrc:    Oral  SpO2: 96% 97% 93% 93%  Weight:      Height:        Intake/Output Summary (Last 24 hours) at 12/10/2018 1440 Last data filed at 12/09/2018 2230 Gross per 24 hour  Intake 720 ml  Output 2900 ml  Net -2180 ml   Last 3 Weights 12/10/2018 12/09/2018 12/08/2018  Weight (lbs) 271 lb 2.7 oz 277 lb 1.9 oz 278 lb 7.1 oz  Weight (kg) 123 kg 125.7 kg 126.3 kg      Telemetry    Normal sinus rhythm- Personally Reviewed  ECG    Performed 12/10/2018, demonstrating atrial tracking with ventricular pacing.- Personally Reviewed  Physical Exam  Morbidly  obese, lying supine in the bed without respiratory difficulty. GEN: No acute distress.   Neck: No JVD Cardiac: RRR, 2/6 to 3/6 systolic murmur left lower sternal border with radiation to the right upper sternal border but no rubs, or gallops.  Respiratory: Clear to auscultation bilaterally. GI: Soft, nontender, non-distended  MS: No edema; No deformity. Neuro:  Nonfocal  Psych: Normal affect   Labs    Chemistry Recent Labs  Lab 12/08/18 1939 12/10/18 0322  NA 139 134*  K 3.9 3.9  CL 105 95*  CO2 22 30  GLUCOSE 214* 239*  BUN 42* 40*  CREATININE 1.88* 2.47*  CALCIUM 9.2 9.0  PROT  --  6.9  ALBUMIN  --  2.9*  AST  --  14*  ALT  --  14  ALKPHOS  --  94  BILITOT  --  0.6  GFRNONAA 27* 20*  GFRAA 31* 23*  ANIONGAP 12 9     Hematology Recent Labs  Lab 12/08/18 1939 12/09/18 0220 12/09/18 0718 12/10/18 0322  WBC 14.1* 15.5*  --  13.9*  RBC 3.56* 3.39*  --  3.50*   3.50*  HGB 8.7* 8.1* 8.4* 8.4*  HCT 29.5* 27.6* 28.5* 28.9*  MCV 82.9 81.4  --  82.6  MCH 24.4* 23.9*  --  24.0*  MCHC 29.5* 29.3*  --  29.1*  RDW 15.9* 15.8*  --  16.1*  PLT 262 243  --  265    Cardiac Enzymes Recent Labs  Lab 12/08/18 1939 12/09/18 0220 12/09/18 0718  TROPONINI 0.34* 0.33* 0.35*   No results for input(s): TROPIPOC in the last 168 hours.   BNP Recent Labs  Lab 12/08/18 1939  BNP 249.0*     DDimer No results for input(s): DDIMER in the last 168 hours.   Radiology    Dg Chest 1 View  Result Date: 12/08/2018 CLINICAL DATA:  Shortness of breath today. Cough. EXAM: CHEST  1 VIEW COMPARISON:  07/24/2018 FINDINGS: Inferior aspect of the chest is not included in the field of view. Left-sided pacemaker in place with ventricular lead partially excluded. Cardiomegaly is slightly progressed from prior. There is vascular congestion. Subsegmental atelectasis in the left mid lung. No confluent airspace disease. No pneumothorax or large pleural effusion. No acute osseous abnormalities  are seen. IMPRESSION: Cardiomegaly with vascular congestion. Electronically Signed   By: Keith Rake M.D.   On: 12/08/2018 19:40    Cardiac Studies   2 D DOPPLER ECHOCARDIOGRAM 12/09/2018: IMPRESSIONS    1. The left ventricle has normal systolic function, with an ejection fraction of 55-60%. The cavity size was normal. There is moderately increased left ventricular wall thickness. Left ventricular diastolic Doppler parameters are consistent with  pseudonormalization. Elevated left atrial and left ventricular end-diastolic pressures The E/e' is >20. There is abnormal septal motion consistent with RV pacemaker.  2. The right ventricle has normal systolic function. The cavity was normal. There is no increase in right ventricular wall thickness.  3. Left atrial size was mildly dilated.  4. Right atrial size was moderately dilated.  5. The mitral valve is degenerative. Mild thickening of the mitral valve leaflet. Mild calcification of the posterior mitral valve leaflet. There is moderate mitral annular calcification present. Mitral valve regurgitation is mild to moderate by color  flow Doppler. Mild mitral valve stenosis.  6. The tricuspid valve is grossly normal. Tricuspid valve regurgitation is moderate.  7. The aortic valve was not well visualized Moderate calcification of the aortic valve. mild-moderate stenosis of the aortic valve.  8. The inferior vena cava was dilated in size with <50% respiratory variability.  SUMMARY   LVEF 55-60%, moderate LVH, incoordinate septal motion, grade 2 DD, high LV filling pressure, moderate aortic stenosis, RV pacer, MAC with mild to moderate MR, mild LAE, moderate RAE, moderate TR, RVSP 62 mmHg, dilated IVC  Patient Profile     68 y.o. female anxiety, osteoarthritis, asthma, chronic back pain, diabetic peripheral neuropathy, DM2, fibromyalgia, GERD, hyperlipidemia, hypertension, hypothyroidism, migraine headaches, obesity, occipital CVA, and  vitamin D deficiency who presented w/ SOB along w/ lower extremity edema and orthopnea. Has underlying history of severe CAD with recent right coronary orbital atherectomy and untreatable left main/circumflex severe calcific disease.  She was turned down as a candidate for coronary bypass surgery.  Assessment & Plan    1. Acute on chronic combined diastolic heart failure, improved with diuresis.  Will need to be discharged home.  Consider changing diuretic regimen to 80 mg daily or twice daily depending upon response. 2. Coronary artery disease.  She has severe disease involving distal left main and circumflex.  Medical treatment is only option as patient was turned down for surgery and there are no interventional options felt reasonable from a technical standpoint.  Elevated  troponin I is a poor prognostic indicator relative to long-term outcome and near-term future ischemic events. 3. CKD, stage III- IV, needs to be followed closely on diuretic therapy 4. Morbid obesity 5. Physical deconditioning  Overall, guarded prognosis related to deconditioning, obesity, underlying severe heart disease, mellitus II, CKD IV, and chronic diastolic heart failure.  [facility] will sign off.   Medication Recommendations: As above. Other recommendations (labs, testing, etc): None Follow up as an outpatient: As previously planned  For questions or updates, please contact [place] Please consult [email or web address] for contact info under        Signed, Sinclair Grooms, MD  12/10/2018, 2:40 PM

## 2018-12-10 NOTE — Progress Notes (Signed)
Mary Raymond  LTJ:[id number] DOB: 09/22/1950 DOA: 12/08/2018 [name], FNP    Brief Narrative:  [phone] w/ a hx of anxiety, osteoarthritis, asthma, chronic back pain, CAD, diabetic peripheral neuropathy, DM2, fibromyalgia, GERD, hyperlipidemia, hypertension, hypothyroidism, migraine headaches, CAD, obesity, occipital CVA, and vitamin D deficiency who presented w/ chest pressure and SOB along w/ lower extremity edema and orthopnea.     Subjective: Feels like she needs O2 at home Tired today  Assessment & Plan:  Chest pain - Multivessel CAD s/p DES x 2 to the RCA with residual unrevascularized LM and LCx disease Not felt to be a candidate for CABG due to very limited functional state -cardiology consult appreciated  Recent Labs  Lab 12/08/18 1939 12/09/18 0220 12/09/18 0718  TROPONINI 0.34* 0.33* 0.35*    Dyspnea - Volume overload EF 60-65% via TTE Sept 19 - CXR c/w pulmonary edema - baseline wgt ~123kg, but at times as low as 118 - diurese - follow renal fxn   Filed Weights   12/08/18 2359 12/09/18 0500 12/10/18 0500  Weight: 126.3 kg 125.7 kg 123 kg    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 Rayann Heman  CKD Stage 3 Baseline crt appears to range from 1.25-1.99  -decrease lasix as Cr up to 2.47  Hypothyroidism Continue levothyroxine  Hyperlipidemia Continue rosuvastatin  DM2 CBG not at goal -increase lantus for now-- monitor closelu  Anxiety Continue alprazolam   GERD Cont Protonix  Normocytic Anemia Secondary to CKD - baseline Hgb ~9.0 as of Feb 2020   Obesity Estimated body mass index is 45.12 kg/m as calculated from the following:   Height as of this encounter: 5\' 5"  (1.651 m).   Weight as of this encounter: 123 kg.  DVT prophylaxis: heparin  Code Status: FULL CODE Family Communication: no family present at time of exam  Disposition Plan: needs further hospital care- weaning of O2   Consultants:  Cardiology   Objective: Blood pressure (!) 126/53, pulse 72, temperature 97.7 F (36.5 C), temperature source Oral, resp. rate (!) 22, height 5\' 5"  (1.651 m), weight 123 kg, SpO2 96 %.  Intake/Output Summary (Last 24 hours) at 12/10/2018 1055 Last data filed at 12/09/2018 2230 Gross per 24 hour  Intake 720 ml  Output 3450 ml  Net -2730 ml   Filed Weights   12/08/18 2359 12/09/18 0500 12/10/18 0500  Weight: 126.3 kg 125.7 kg 123 kg    Examination: Chronically ill appearing Diminished breath sounds Sleepy but will awaken to answer questions +LE edema Obese    CBC: Recent Labs  Lab 12/08/18 1939 12/09/18 0220 12/09/18 0718 12/10/18 0322  WBC 14.1* 15.5*  --  13.9*  NEUTROABS 10.6*  --   --   --   HGB 8.7* 8.1* 8.4* 8.4*  HCT 29.5* 27.6* 28.5* 28.9*  MCV 82.9 81.4  --  82.6  PLT 262 243  --  263   Basic Metabolic Panel: Recent Labs  Lab 12/08/18 1939 12/10/18 0322  NA 139 134*  K 3.9 3.9  CL 105 95*  CO2 22 30  GLUCOSE 214* 239*  BUN 42* 40*  CREATININE 1.88* 2.47*  CALCIUM 9.2 9.0  MG 1.8  --    GFR: Estimated Creatinine Clearance: 29.1 mL/min (A) (by C-G formula based on SCr of 2.47 mg/dL (H)).  Liver Function Tests: Recent Labs  Lab 12/10/18 0322  AST 14*  ALT 14  ALKPHOS 94  BILITOT 0.6  PROT 6.9  ALBUMIN 2.9*   No results for input(s): LIPASE, AMYLASE in the last 168 hours. No results for input(s): AMMONIA in the last 168 hours.  Coagulation Profile: No results for input(s): INR, PROTIME in the last 168 hours.  Cardiac Enzymes: Recent Labs  Lab 12/08/18 1939 12/09/18 0220 12/09/18 0718  TROPONINI 0.34* 0.33* 0.35*    HbA1C: HB A1C (BAYER DCA - WAIVED)  Date/Time Value Ref Range Status  10/27/2018 11:33 AM 8.8 (H) <7.0 % Final    Comment:                                          Diabetic Adult            <7.0                                       Healthy Adult        4.3 - 5.7                                                            (DCCT/NGSP) American Diabetes Association's Summary of Glycemic Recommendations for Adults with Diabetes: Hemoglobin A1c <7.0%. More stringent glycemic goals (A1c <6.0%) may further reduce complications at the cost of increased risk of hypoglycemia.   02/25/2018 10:33 AM 8.3 (H) <7.0 % Final    Comment:                                          Diabetic Adult            <7.0                                       Healthy Adult        4.3 - 5.7                                                           (DCCT/NGSP) American Diabetes Association's Summary of Glycemic Recommendations for Adults with Diabetes: Hemoglobin A1c <7.0%. More stringent glycemic goals (A1c <6.0%) may further reduce complications at the cost of increased risk of hypoglycemia.    Hgb A1c MFr Bld  Date/Time Value Ref Range Status  06/05/2018 07:47 PM 7.3 (H) 4.8 - 5.6 % Final    Comment:    (NOTE) Pre diabetes:          5.7%-6.4% Diabetes:              >6.4% Glycemic control for   <7.0% adults with diabetes     CBG: Recent Labs  Lab 12/09/18 0623 12/09/18 1124 12/09/18 1609 12/09/18 2134 12/10/18 0651  GLUCAP 182* 154* 153* 241* 204*    Recent Results (from the past 240 hour(s))  MRSA PCR Screening     Status: None   Collection Time: 12/08/18 11:58 PM  Result Value Ref Range Status   MRSA by PCR NEGATIVE NEGATIVE Final    Comment:        The GeneXpert MRSA Assay (FDA approved for NASAL specimens only), is one component of a comprehensive MRSA colonization surveillance program. It is not intended to diagnose MRSA infection nor to guide or monitor treatment for MRSA infections. Performed at [facility], [place] [address], [place], [place] [address]      Scheduled Meds: . acyclovir  400 mg Oral Daily  . aspirin EC  81 mg Oral Daily  . clopidogrel  75 mg Oral Daily  . furosemide  40 mg Intravenous BID  . gabapentin  100 mg Oral TID  . heparin injection  (subcutaneous)  5,000 Units Subcutaneous Q8H  . insulin aspart  0-20 Units Subcutaneous TID WC  . insulin aspart  0-5 Units Subcutaneous QHS  . [START ON 12/11/2018] insulin glargine  15 Units Subcutaneous Daily  . levothyroxine  50 mcg Oral Q0600  . [START ON 12/11/2018] losartan  150 mg Oral Daily  . mouth rinse  15 mL Mouth Rinse BID  . montelukast  10 mg Oral QHS  . pantoprazole  40 mg Oral Daily  . rosuvastatin  40 mg Oral Daily   Continuous Infusions:    LOS: 2 days    Geradine Girt DO  [facility] 12/10/2018, 10:55 AM  If 7PM-7AM, please contact night-coverage per Amion 12/10/2018, 10:55 AM

## 2018-12-10 NOTE — Evaluation (Signed)
Physical Therapy Evaluation Patient Details Name: Mary Raymond MRN: [id number] DOB: 1950/11/25 Today's Date: 12/10/2018   History of Present Illness  Pt is a 68 y/o female admitted secondary to worsening SOB thought to be secondary to CHF. Pt also with severe CAD, however, is not a surgical candidate and per cardiology notes, will be managed medically. PMH includes DM, anxiety, CAD, asthma, CVA, MI, pacemaker, CKD.   Clinical Impression  Pt admitted secondary to problem above with deficits below. Pt requiring min to min guard A with use of RW to transfer to chair this session. Pt with generalized weakness and decreased activity tolerance. Discussed SNF, however, pt reports she is wanting to go home with HHPT. Will continue to follow acutely to maximize functional mobility independence and safety.     Follow Up Recommendations Home health PT;Supervision/Assistance - 24 hour(refusing SNF )    Equipment Recommendations  None recommended by PT    Recommendations for Other Services OT consult     Precautions / Restrictions Precautions Precautions: Fall Restrictions Weight Bearing Restrictions: No      Mobility  Bed Mobility Overal bed mobility: Needs Assistance Bed Mobility: Rolling;Sidelying to Sit Rolling: Supervision Sidelying to sit: Supervision;HOB elevated       General bed mobility comments: Supervision for safety. Required elevated HOB and use of bed rails. Increased time required.   Transfers Overall transfer level: Needs assistance Equipment used: Rolling walker (2 wheeled) Transfers: Sit to/from [facility] Sit to Stand: Min assist Stand pivot transfers: Min guard       General transfer comment: Pt requiring min A for lift assist and steadying to stand. Min guard to transfer to chair for steadying assist. Further mobility deferred as pt complaining of back pain and she was wanting to eat dinner.   Ambulation/Gait                 Stairs            Wheelchair Mobility    Modified Rankin (Stroke Patients Only)       Balance Overall balance assessment: Needs assistance Sitting-balance support: No upper extremity supported;Feet supported Sitting balance-Leahy Scale: Good     Standing balance support: Bilateral upper extremity supported;During functional activity Standing balance-Leahy Scale: Poor Standing balance comment: Reliant on BUE support.                              Pertinent Vitals/Pain Pain Assessment: Faces Faces Pain Scale: Hurts little more Pain Location: back pain Pain Descriptors / Indicators: Aching;Grimacing;Guarding Pain Intervention(s): Limited activity within patient's tolerance;Monitored during session;Repositioned    Home Living Family/patient expects to be discharged to:: Private residence Living Arrangements: Children Available Help at Discharge: Family;Available 24 hours/day Type of Home: House Home Access: Stairs to enter Entrance Stairs-Rails: None Entrance Stairs-Number of Steps: 3-4 Home Layout: One level Home Equipment: Shower seat;Cane - single point;Walker - 2 wheels      Prior Function Level of Independence: Independent with assistive device(s)         Comments: Reports she was independent with use of RW. Reports fear of falling getting into and out of tub.      Hand Dominance        Extremity/Trunk Assessment   Upper Extremity Assessment Upper Extremity Assessment: Defer to OT evaluation    Lower Extremity Assessment Lower Extremity Assessment: Generalized weakness    Cervical / Trunk Assessment Cervical / Trunk Assessment: Other exceptions  Cervical / Trunk Exceptions: chronic back pain   Communication   Communication: No difficulties  Cognition Arousal/Alertness: Awake/alert Behavior During Therapy: WFL for tasks assessed/performed Overall Cognitive Status: Within Functional Limits for tasks assessed                                         General Comments General comments (skin integrity, edema, etc.): Discussed SNF vs HHPT, however, pt refusing SNF at this time.     Exercises     Assessment/Plan    PT Assessment Patient needs continued PT services  PT Problem List Decreased strength;Decreased balance;Decreased activity tolerance;Decreased mobility;Decreased knowledge of use of DME;Decreased knowledge of precautions;Pain       PT Treatment Interventions DME instruction;Gait training;Functional mobility training;Therapeutic activities;Stair training;Balance training;Therapeutic exercise;Patient/family education    PT Goals (Current goals can be found in the Care Plan section)  Acute Rehab PT Goals Patient Stated Goal: to go home PT Goal Formulation: With patient Time For Goal Achievement: 12/24/18 Potential to Achieve Goals: Good    Frequency Min 3X/week   Barriers to discharge        Co-evaluation               AM-PAC PT "6 Clicks" Mobility  Outcome Measure Help needed turning from your back to your side while in a flat bed without using bedrails?: A Little Help needed moving from lying on your back to sitting on the side of a flat bed without using bedrails?: A Lot Help needed moving to and from a bed to a chair (including a wheelchair)?: A Little Help needed standing up from a chair using your arms (e.g., wheelchair or bedside chair)?: A Little Help needed to walk in hospital room?: A Little Help needed climbing 3-5 steps with a railing? : A Lot 6 Click Score: 16    End of Session Equipment Utilized During Treatment: Gait belt Activity Tolerance: Patient limited by pain Patient left: in chair;with call bell/phone within reach Nurse Communication: Mobility status PT Visit Diagnosis: Other abnormalities of gait and mobility (R26.89);Unsteadiness on feet (R26.81);Muscle weakness (generalized) (M62.81);Pain Pain - part of body: (back)    Time: 6599-3570 PT Time  Calculation (min) (ACUTE ONLY): 21 min   Charges:   PT Evaluation $PT Eval Moderate Complexity: [place], PT, DPT  Acute Rehabilitation Services  Pager: [phone] Office: [phone]   Rudean Hitt 12/10/2018, 5:47 PM

## 2018-12-10 NOTE — Plan of Care (Signed)

## 2018-12-10 NOTE — Consult Note (Signed)
   [facility] Inpatient Consult   12/10/2018  Mary Raymond [birth year] [id number]    Patient screened for potential [facility] Care Management services with her [facility] HMO plan. Patient has Extreme risk of 39% for unplanned readmissions and hospitalizations. Chart reviewed and noted no current disposition as of yet.   Patient presented to the hospital with chest pain- pressure-like, non-radiating, associated with shortness of breath along with significant lower extremity edema and orthopnea.  Per history and physical on 12/08/18, patient is a 68y.o. female with a hx of anxiety, osteoarthritis, asthma, chronic back pain, CAD, diabetic peripheral neuropathy, DM2, fibromyalgia, GERD, hyperlipidemia, hypertension, hypothyroidism, migraine headaches, CAD, obesity, occipital CVA, and vitamin D deficiency.  Patient is from home living with her children. Her primary care provider is Evelina Dun, NP with [facility].   Spoke to transition of care CM and states that patient is possibly needing oxygen at home and had spoken to patient regarding Medicaid follow-up. Has also mentioned doing a high risk assessment due to volume overload.  Will follow disposition needs. If patient's post hospital needs change, please place a [facility] Care Management consult for community follow-up as appropriate.  Of note, [facility] Care Management services does not replace or interfere with any services that are arranged by transition of care CM or social work.    For questions or referral, please contact:  Conall Vangorder A. Rosabella Edgin, BSN, RN-BC [facility] Liaison Cell: [phone]

## 2018-12-11 ENCOUNTER — Telehealth: Payer: Self-pay

## 2018-12-11 DIAGNOSIS — E039 Hypothyroidism, unspecified: Secondary | ICD-10-CM

## 2018-12-11 LAB — BASIC METABOLIC PANEL
Anion gap: 14 (ref 5–15)
BUN: 47 mg/dL — ABNORMAL HIGH (ref 8–23)
CO2: 27 mmol/L (ref 22–32)
Calcium: 8.9 mg/dL (ref 8.9–10.3)
Chloride: 94 mmol/L — ABNORMAL LOW (ref 98–111)
Creatinine, Ser: 2.52 mg/dL — ABNORMAL HIGH (ref 0.44–1.00)
GFR calc Af Amer: 22 mL/min — ABNORMAL LOW (ref >60)
GFR, EST NON AFRICAN AMERICAN: 19 mL/min — AB (ref >60)
Glucose, Bld: 424 mg/dL — ABNORMAL HIGH (ref 70–99)
Potassium: 4.2 mmol/L (ref 3.5–5.1)
SODIUM: 135 mmol/L (ref 135–145)

## 2018-12-11 LAB — GLUCOSE, CAPILLARY
GLUCOSE-CAPILLARY: 363 mg/dL — AB (ref 70–99)
Glucose-Capillary: 322 mg/dL — ABNORMAL HIGH (ref 70–99)
Glucose-Capillary: 357 mg/dL — ABNORMAL HIGH (ref 70–99)
Glucose-Capillary: 421 mg/dL — ABNORMAL HIGH (ref 70–99)

## 2018-12-11 MED ORDER — LOSARTAN POTASSIUM 25 MG PO TABS
25.0000 mg | ORAL_TABLET | Freq: Every day | ORAL | Status: DC
  Administered 2018-12-12 – 2018-12-17 (×6): 25 mg via ORAL
  Filled 2018-12-11 (×7): qty 1

## 2018-12-11 MED ORDER — FUROSEMIDE 80 MG PO TABS
80.0000 mg | ORAL_TABLET | Freq: Every day | ORAL | Status: DC
  Administered 2018-12-12 – 2018-12-17 (×6): 80 mg via ORAL
  Filled 2018-12-11 (×6): qty 1

## 2018-12-11 MED ORDER — INSULIN GLARGINE 100 UNIT/ML ~~LOC~~ SOLN: 15.0000 [IU] | Freq: Two times a day (BID) | SUBCUTANEOUS | Status: DC

## 2018-12-11 MED ORDER — INSULIN GLARGINE 100 UNIT/ML ~~LOC~~ SOLN
20.0000 [IU] | Freq: Two times a day (BID) | SUBCUTANEOUS | Status: DC
  Administered 2018-12-11: 20 [IU] via SUBCUTANEOUS
  Filled 2018-12-11 (×2): qty 0.2

## 2018-12-11 MED ORDER — INSULIN GLARGINE 100 UNIT/ML ~~LOC~~ SOLN
5.0000 [IU] | Freq: Once | SUBCUTANEOUS | Status: AC
  Administered 2018-12-11: 5 [IU] via SUBCUTANEOUS
  Filled 2018-12-11: qty 0.05

## 2018-12-11 MED ORDER — POLYETHYLENE GLYCOL 3350 17 G PO PACK
17.0000 g | PACK | Freq: Every day | ORAL | Status: DC
  Administered 2018-12-11 – 2018-12-17 (×6): 17 g via ORAL
  Filled 2018-12-11 (×7): qty 1

## 2018-12-11 MED ORDER — INSULIN ASPART 100 UNIT/ML ~~LOC~~ SOLN
7.0000 [IU] | Freq: Once | SUBCUTANEOUS | Status: AC
  Administered 2018-12-11: 7 [IU] via SUBCUTANEOUS

## 2018-12-11 NOTE — Care Management Important Message (Signed)
Important Message  Patient Details  Name: Mary Raymond MRN: [id number] Date of Birth: 21-Nov-1950   Medicare Important Message Given:  Yes    Orbie Pyo 12/11/2018, 2:28 PM

## 2018-12-11 NOTE — Progress Notes (Addendum)
Mary Raymond  CVE:[id number] DOB: 01-22-1951 DOA: 12/08/2018 [name], FNP    Brief Narrative:  [phone] w/ a hx of anxiety, osteoarthritis, asthma, chronic back pain, CAD, diabetic peripheral neuropathy, DM2, fibromyalgia, GERD, hyperlipidemia, hypertension, hypothyroidism, migraine headaches, CAD, obesity, occipital CVA, and vitamin D deficiency who presented w/ chest pressure and SOB along w/ lower extremity edema and orthopnea.     Subjective: Still feeling short of breath and c/o low back pain  Assessment & Plan:  Chest pain - Multivessel CAD s/p DES x 2 to the RCA with residual unrevascularized LM and LCx disease Not felt to be a candidate for CABG due to very limited functional state -cardiology consult appreciated  Recent Labs  Lab 12/08/18 1939 12/09/18 0220 12/09/18 0718  TROPONINI 0.34* 0.33* 0.35*    Dyspnea - Volume overload EF 60-65% via TTE Sept 19 - CXR c/w pulmonary edema - baseline wgt ~123kg, but at times as low as 118 - diurese - follow renal fxn   Filed Weights   12/09/18 0500 12/10/18 0500 12/11/18 0640  Weight: 125.7 kg 123 kg 120.7 kg    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 Rayann Heman  CKD Stage 3 Baseline crt appears to range from 1.25-1.99  -change lasix to PO  Hypothyroidism Continue levothyroxine  Hyperlipidemia Continue rosuvastatin  DM2 CBG not at goal -increase lantus for now-- monitor closely  Anxiety Continue alprazolam   GERD Cont Protonix  Normocytic Anemia Secondary to CKD - baseline Hgb ~9.0 as of Feb 2020   Obesity Estimated body mass index is 44.28 kg/m as calculated from the following:   Height as of this encounter: 5\' 5"  (1.651 m).   Weight as of this encounter: 120.7 kg.  Overall poor prognosis   DVT prophylaxis: heparin  Code Status: FULL CODE Family Communication: no family present at time of exam  Disposition Plan: change lasix to PO and monitor  output-- hope for home in the AM-- refused home health  Consultants:  Cardiology   Objective: Blood pressure (!) 119/55, pulse 94, temperature 98.1 F (36.7 C), temperature source Oral, resp. rate 16, height 5\' 5"  (1.651 m), weight 120.7 kg, SpO2 100 %.  Intake/Output Summary (Last 24 hours) at 12/11/2018 1012 Last data filed at 12/11/2018 0836 Gross per 24 hour  Intake 1500 ml  Output 3250 ml  Net -1750 ml   Filed Weights   12/09/18 0500 12/10/18 0500 12/11/18 0640  Weight: 125.7 kg 123 kg 120.7 kg    Examination: Up working with PT- deconditioned Diminished breath sounds due to body habitus, no wheezing rrr +BS, obese Non-pitting LE edema, legs tender to touch   CBC: Recent Labs  Lab 12/08/18 1939 12/09/18 0220 12/09/18 0718 12/10/18 0322  WBC 14.1* 15.5*  --  13.9*  NEUTROABS 10.6*  --   --   --   HGB 8.7* 8.1* 8.4* 8.4*  HCT 29.5* 27.6* 28.5* 28.9*  MCV 82.9 81.4  --  82.6  PLT 262 243  --  527   Basic Metabolic Panel: Recent Labs  Lab 12/08/18 1939 12/10/18 0322 12/11/18 0749  NA 139 134* 135  K 3.9 3.9 4.2  CL 105 95* 94*  CO2 22 30 27   GLUCOSE 214* 239* 424*  BUN 42* 40* 47*  CREATININE 1.88* 2.47* 2.52*  CALCIUM 9.2 9.0 8.9  MG 1.8  --   --    GFR: Estimated Creatinine Clearance: 28.2 mL/min (A) (by C-G  formula based on SCr of 2.52 mg/dL (H)).  Liver Function Tests: Recent Labs  Lab 12/10/18 0322  AST 14*  ALT 14  ALKPHOS 94  BILITOT 0.6  PROT 6.9  ALBUMIN 2.9*   No results for input(s): LIPASE, AMYLASE in the last 168 hours. No results for input(s): AMMONIA in the last 168 hours.  Coagulation Profile: No results for input(s): INR, PROTIME in the last 168 hours.  Cardiac Enzymes: Recent Labs  Lab 12/08/18 1939 12/09/18 0220 12/09/18 0718  TROPONINI 0.34* 0.33* 0.35*    HbA1C: HB A1C (BAYER DCA - WAIVED)  Date/Time Value Ref Range Status  10/27/2018 11:33 AM 8.8 (H) <7.0 % Final    Comment:                                           Diabetic Adult            <7.0                                       Healthy Adult        4.3 - 5.7                                                           (DCCT/NGSP) American Diabetes Association's Summary of Glycemic Recommendations for Adults with Diabetes: Hemoglobin A1c <7.0%. More stringent glycemic goals (A1c <6.0%) may further reduce complications at the cost of increased risk of hypoglycemia.   02/25/2018 10:33 AM 8.3 (H) <7.0 % Final    Comment:                                          Diabetic Adult            <7.0                                       Healthy Adult        4.3 - 5.7                                                           (DCCT/NGSP) American Diabetes Association's Summary of Glycemic Recommendations for Adults with Diabetes: Hemoglobin A1c <7.0%. More stringent glycemic goals (A1c <6.0%) may further reduce complications at the cost of increased risk of hypoglycemia.    Hgb A1c MFr Bld  Date/Time Value Ref Range Status  06/05/2018 07:47 PM 7.3 (H) 4.8 - 5.6 % Final    Comment:    (NOTE) Pre diabetes:          5.7%-6.4% Diabetes:              >6.4% Glycemic control for   <7.0% adults with diabetes     CBG:  Recent Labs  Lab 12/10/18 0651 12/10/18 1104 12/10/18 1709 12/10/18 2113 12/11/18 0641  GLUCAP 204* 276* 259* 317* 322*    Recent Results (from the past 240 hour(s))  MRSA PCR Screening     Status: None   Collection Time: 12/08/18 11:58 PM  Result Value Ref Range Status   MRSA by PCR NEGATIVE NEGATIVE Final    Comment:        The GeneXpert MRSA Assay (FDA approved for NASAL specimens only), is one component of a comprehensive MRSA colonization surveillance program. It is not intended to diagnose MRSA infection nor to guide or monitor treatment for MRSA infections. Performed at [facility], [place] [address] [place], [place] [address]      Scheduled Meds: . acyclovir  400 mg Oral Daily  . aspirin EC  81 mg  Oral Daily  . clopidogrel  75 mg Oral Daily  . [START ON 12/12/2018] furosemide  80 mg Oral Daily  . gabapentin  100 mg Oral TID  . heparin injection (subcutaneous)  5,000 Units Subcutaneous Q8H  . insulin aspart  0-20 Units Subcutaneous TID WC  . insulin aspart  0-5 Units Subcutaneous QHS  . insulin glargine  20 Units Subcutaneous BID  . levothyroxine  50 mcg Oral Q0600  . losartan  25 mg Oral Daily  . mouth rinse  15 mL Mouth Rinse BID  . montelukast  10 mg Oral QHS  . pantoprazole  40 mg Oral Daily  . rosuvastatin  40 mg Oral Daily   Continuous Infusions:    LOS: 3 days    Geradine Girt DO  [facility] 12/11/2018, 10:12 AM  If 7PM-7AM, please contact night-coverage per Amion 12/11/2018, 10:12 AM

## 2018-12-11 NOTE — NC FL2 (Signed)
[place] LEVEL OF CARE SCREENING TOOL     IDENTIFICATION  Patient Name: Mary Raymond Birthdate: 1950-10-25 Sex: female Admission Date (Current Location): 12/08/2018  [facility] [place] Number:  Herbalist and Address:  [facility] [place], House [address], [place], [place] [address]      Provider Number: [id number]  Attending Physician Name and Address:  Geradine Girt, DO  Relative Name and Phone Number:       Current Level of Care: Hospital Recommended Level of Care: [place] Prior Approval Number:    Date Approved/Denied:   PASRR Number: [id number]  Discharge Plan: SNF    Current Diagnoses: Patient Active Problem List   Diagnosis Date Noted  . Acute on chronic congestive heart failure ([place])   . Acute coronary syndrome ([place]) 12/08/2018  . Anemia 12/08/2018  . Deep venous thrombosis ([place])   . Cardiac device in situ   . Cerebral embolism with cerebral infarction 06/09/2018  . NSTEMI (non-ST elevated myocardial infarction) ([place]), 05/2018   . Chest pain 06/05/2018  . Troponin level elevated 06/05/2018  . Noncompliance 11/05/2017  . Genital herpes 08/02/2016  . Pain medication agreement signed 05/01/2016  . Uncomplicated opioid dependence ([place]) 05/01/2016  . Chronic back pain 08/10/2015  . 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 Depression 11/15/2014  . GERD (gastroesophageal reflux disease) 11/15/2014  . Hypertension associated with diabetes ([place])   . Hyperlipidemia associated with type 2 diabetes mellitus ([place])   . Type II diabetes mellitus ([place])   . Anxiety   . Obesity, morbid, BMI 40.0-49.9 ([place])   . Allergy   . Asthma   . Vitamin D deficiency   . Neuropathy   . Hypothyroidism 02/25/2013    Orientation RESPIRATION BLADDER Height & Weight     Self, Time, Situation, Place  O2([place] 1L) Continent Weight: 266 lb 1.5 oz (120.7 kg) Height:  5\' 5"  (165.1 cm)  BEHAVIORAL  SYMPTOMS/MOOD NEUROLOGICAL BOWEL NUTRITION STATUS      Continent Diet(see DC summary)  AMBULATORY STATUS COMMUNICATION OF NEEDS Skin   Limited Assist Verbally Normal                       Personal Care Assistance Level of Assistance  Bathing, Feeding, Dressing Bathing Assistance: Limited assistance Feeding assistance: Independent Dressing Assistance: Limited assistance     Functional Limitations Info  Sight, Hearing, Speech Sight Info: Adequate Hearing Info: Adequate Speech Info: Adequate    SPECIAL CARE FACTORS FREQUENCY  PT (By licensed PT), OT (By licensed OT)     PT Frequency: 5x/wk OT Frequency: 5x/wk            Contractures Contractures Info: Not present    Additional Factors Info  Code Status, Allergies, Psychotropic, Insulin Sliding Scale Code Status Info: Full Allergies Info: Atorvastatin, Rosuvastatin Psychotropic Info: Xanax 0.5mg  at night PRN Insulin Sliding Scale Info: 0-20 units 3x/day with meals; 0-5 units daily at bed; Lantus 20 units 2x/day       Current Medications (12/11/2018):  This is the current hospital active medication list Current Facility-Administered Medications  Medication Dose Route Frequency Provider Last Rate Last Dose  . acetaminophen (TYLENOL) tablet 650 mg  650 mg Oral Q6H PRN Cherene Altes, MD      . acyclovir (ZOVIRAX) tablet 400 mg  400 mg Oral Daily Reubin Milan, MD   400 mg at 12/11/18 4536  . ALPRAZolam (XANAX) tablet 0.5 mg  0.5 mg Oral QHS PRN Reubin Milan, MD   0.5 mg at 12/10/18 2117  . aspirin EC tablet 81 mg  81 mg Oral Daily Reubin Milan, MD   81 mg at 12/11/18 0754  . clopidogrel (PLAVIX) tablet 75 mg  75 mg Oral Daily Reubin Milan, MD   75 mg at 12/11/18 0754  . [START ON 12/12/2018] furosemide (LASIX) tablet 80 mg  80 mg Oral Daily Vann, Jessica U, DO      . gabapentin (NEURONTIN) capsule 100 mg  100 mg Oral TID Reubin Milan, MD   100 mg at 12/11/18 0753  . heparin  injection 5,000 Units  5,000 Units Subcutaneous Q8H Cherene Altes, MD   5,000 Units at 12/11/18 [phone]  . HYDROcodone-acetaminophen (NORCO/VICODIN) 5-325 MG per tablet 1-2 tablet  1-2 tablet Oral Q6H PRN Cherene Altes, MD   2 tablet at 12/11/18 [phone]  . insulin aspart (novoLOG) injection 0-20 Units  0-20 Units Subcutaneous TID WC Reubin Milan, MD   20 Units at 12/11/18 1128  . insulin aspart (novoLOG) injection 0-5 Units  0-5 Units Subcutaneous QHS Reubin Milan, MD   4 Units at 12/10/18 2116  . insulin glargine (LANTUS) injection 20 Units  20 Units Subcutaneous BID Vann, Jessica U, DO      . insulin glargine (LANTUS) injection 5 Units  5 Units Subcutaneous Once Vann, Jessica U, DO      . ipratropium-albuterol (DUONEB) 0.5-2.5 (3) MG/3ML nebulizer solution 3 mL  3 mL Nebulization Q2H PRN Cherene Altes, MD      . levothyroxine (SYNTHROID, LEVOTHROID) tablet 50 mcg  50 mcg Oral Q0600 Reubin Milan, MD   50 mcg at 12/11/18 0617  . losartan (COZAAR) tablet 25 mg  25 mg Oral Daily Eulogio Bear U, DO      . MEDLINE mouth rinse  15 mL Mouth Rinse BID Reubin Milan, MD   15 mL at 12/09/18 1054  . montelukast (SINGULAIR) tablet 10 mg  10 mg Oral QHS Reubin Milan, MD   10 mg at 12/10/18 2117  . nitroGLYCERIN (NITROSTAT) SL tablet 0.4 mg  0.4 mg Sublingual Q5 Min x 3 PRN Reubin Milan, MD      . ondansetron [facility]) injection 4 mg  4 mg Intravenous Q6H PRN Reubin Milan, MD      . pantoprazole (PROTONIX) EC tablet 40 mg  40 mg Oral Daily Reubin Milan, MD   40 mg at 12/11/18 0753  . rosuvastatin (CRESTOR) tablet 40 mg  40 mg Oral Daily Reubin Milan, MD   40 mg at 12/11/18 8110  . traMADol (ULTRAM) tablet 50 mg  50 mg Oral Q6H PRN Cherene Altes, MD         Discharge Medications: Please see discharge summary for a list of discharge medications.  Relevant Imaging Results:  Relevant Lab Results:   Additional Information SS#:  [id number]  Geralynn Ochs, LCSW

## 2018-12-11 NOTE — Progress Notes (Signed)
Daughter, Mendel Ryder, spoke to this RN via phone.  Daughter reports strong concerns about patient going home.  States patient goes home, sits in a recliner, and does nothing for herself.  She is concerned d/t patient would be home alone, unattended, for the majority of the day, except for a 68 year old teenager that is home due to COVID-19 school closings.  Patient has prior refused SNF stating she "doesn't want them to take my check"; however, after conversing with patient about the safety risks of being home alone, and the potential for insurance to help pay for SNF rehab, patient is open to going to SNF.  CSW notified of patient changing her mind, and daughter's concerns.  (Daughter does not want patient to know that she expressed concerns because she is afraid her mom will hold this against her)  Patient worked w/PT this AM - ambulated 62ft x 2 w/walker; patient now refuses to get OOB stating she is "too tired".  Patient remains on O2 @ 1L/[place].  Attempted to place on RA; however, patient desats to mid 59s.  Resps are shallow.  MD made changes to insulin coverage d/t elevated CBGs.  Will continue to monitor.

## 2018-12-11 NOTE — Telephone Encounter (Signed)
The PT office in front of [place] has closed indefinitely due to the pandemic. Pt will need to have PT at home or at another facility

## 2018-12-11 NOTE — Telephone Encounter (Signed)
That office is closed right now due to COVID-19

## 2018-12-11 NOTE — TOC Progression Note (Signed)
Transition of Care [facility]) - Progression Note    Patient Details  Name: Mary Raymond MRN: [id number] Date of Birth: [birth year]  [facility] CM/SW Contact  Maryclare Labrador, RN Phone Number: 12/11/2018, 9:42 AM  Clinical Narrative:   CM received order for HHPT.  Pt declined home health secondary to ongoing construction it her home.  Pt is although interested in outpt PT located at her PCP office Dr Evelina Dun at [place].  CM contacted referral coordinator in office and left VM informed on needed referral.    Expected Discharge Plan: [place]    Expected Discharge Plan and Services Expected Discharge Plan: [place] arrangements for the past 2 months: Single Family Home                           Social Determinants of Health (SDOH) Interventions    Readmission Risk Interventions Readmission Risk Prevention Plan 12/10/2018  Transportation Screening Complete  Medication Review ([place]) Complete  PCP or Specialist appointment within 3-5 days of discharge Complete  HRI or [place] Complete  SW Recovery Care/Counseling Consult Complete  [place] Patient Refused  Some recent data might be hidden

## 2018-12-11 NOTE — Telephone Encounter (Signed)
Patient does not want PT at home because of construction at her house  Wants to do in office in front of ours

## 2018-12-11 NOTE — Progress Notes (Signed)
Inpatient Diabetes Program Recommendations  AACE/ADA: New Consensus Statement on Inpatient Glycemic Control (2015)  Target Ranges:  Prepandial:   less than 140 mg/dL      Peak postprandial:   less than 180 mg/dL (1-2 hours)      Critically ill patients:  140 - 180 mg/dL   Lab Results  Component Value Date   GLUCAP 322 (H) 12/11/2018   HGBA1C 8.8 (H) 10/27/2018    Review of Glycemic Control Results for KADI, HESSION (MRN [id number]) as of 12/11/2018 09:34  Ref. Range 12/10/2018 06:51 12/10/2018 11:04 12/10/2018 17:09 12/10/2018 21:13 12/11/2018 06:41  Glucose-Capillary Latest Ref Range: 70 - 99 mg/dL 204 (H) 276 (H) 259 (H) 317 (H) 322 (H)   Diabetes history: DM 2  Outpatient Diabetes medications:  Lantus 28 units bid, Metformin 1000 mg bid, Ozempic 0.25 mg daily Current orders for Inpatient glycemic control:  Novolog resistant tid with meals and HS, Lantus 20 units bid  Inpatient Diabetes Program Recommendations:    Patient only received Lantus 10 units yesterday.  Lantus has been increased to 20 units bid.   -May consider adding Novolog meal coverage 5 units tid with meals (hold if patient eats less than 50%).   Thanks,  Adah Perl, RN, BC-ADM Inpatient Diabetes Coordinator Pager [phone] (8a-5p)

## 2018-12-11 NOTE — Telephone Encounter (Signed)
Aware. 

## 2018-12-11 NOTE — TOC Initial Note (Signed)
Transition of Care [facility]) - Initial/Assessment Note    Patient Details  Name: Mary Raymond MRN: [id number] Date of Birth: 02-May-1951  [facility]) CM/SW Contact:    Vinie Sill, [place] Phone Number: 12/11/2018, 3:36 PM  Clinical Narrative:                   Expected Discharge Plan: Skilled Nursing Facility Barriers to Discharge: Insurance Authorization   Patient Goals and CMS Choice Patient states their goals for this hospitalization and ongoing recovery are:: To be able to breathe and walk CMS Medicare.gov Compare Post Acute Care list provided to:: Patient Choice offered to / list presented to : Patient  Expected Discharge Plan and Services Expected Discharge Plan: [place] spoke with the patient at bedside and her daughter by phone and they both agreed to short term rehab at [facility]. Patient previous refused SNF stating she would be fine at home. Patient's daughter expressed concerns about the pateint's mobility and she would be home alone. Patient's daughter states she just started a new job and would not be there during the day to provide the care needed.    Patient and family states  preference for [facility]. CSW contacted SNF and they will start authorization once PT notes has been updated with new discharge plan since the patient is now willing to go to SNF.   Patient/ family is realistic regarding therapy needs and expressed being hopeful for SNF placement. Patient expressed understanding of CSW role and discharge process as well as medical condition. No questions/concerns about plan or treatment at this time.     Living arrangements for the past 2 months: [place]                          Prior Living Arrangements/Services Living arrangements for the past 2 months: [place] Lives with:: Adult Children Patient language and need for interpreter reviewed:: No Do you feel safe going back to the place  where you live?: No   family and pateint agrees [facility]  verses home  Need for Family Participation in Patient Care: Yes (Comment) Care giver support system in place?: Yes (comment)   Criminal Activity/Legal Involvement Pertinent to Current Situation/Hospitalization: No - Comment as needed  Activities of Daily Living Home Assistive Devices/Equipment: Walker (specify type)(front wheel walker) ADL Screening (condition at time of admission) Patient's cognitive ability adequate to safely complete daily activities?: Yes Is the patient deaf or have difficulty hearing?: No Does the patient have difficulty seeing, even when wearing glasses/contacts?: No Does the patient have difficulty concentrating, remembering, or making decisions?: No Patient able to express need for assistance with ADLs?: Yes Does the patient have difficulty dressing or bathing?: No Independently performs ADLs?: No Communication: Independent Dressing (OT): Independent Grooming: Independent Feeding: Independent Bathing: Needs assistance Is this a change from baseline?: Pre-admission baseline Toileting: Needs assistance Is this a change from baseline?: Change from baseline, expected to last <3 days In/Out Bed: Needs assistance Is this a change from baseline?: Change from baseline, expected to last <3 days Walks in Home: Needs assistance Is this a change from baseline?: Pre-admission baseline Does the patient have difficulty walking or climbing stairs?: Yes Weakness of Legs: Both Weakness of Arms/Hands: None  Permission Sought/Granted Permission sought to share information with : Case Manager    Share Information with NAME: Camil Hausmann   Permission granted to  share info w AGENCY: SNFs  Permission granted to share info w Relationship: daughter  Permission granted to share info w Contact Information: [phone]  Emotional Assessment Appearance:: Appears stated age Attitude/Demeanor/Rapport: Engaged,  Gracious Affect (typically observed): Accepting Orientation: : Oriented to Self, Oriented to Place, Oriented to  Time, Oriented to Situation Alcohol / Substance Use: Not Applicable Psych Involvement: No (comment)  Admission diagnosis:  Unstable angina (HCC) [I20.0] Troponin level elevated [R79.89] Acute on chronic congestive heart failure, unspecified heart failure type [facility]) [I50.9] Patient Active Problem List   Diagnosis Date Noted  . Acute on chronic congestive heart failure ([place])   . Acute coronary syndrome ([place]) 12/08/2018  . Anemia 12/08/2018  . Deep venous thrombosis ([place])   . Cardiac device in situ   . Cerebral embolism with cerebral infarction 06/09/2018  . NSTEMI (non-ST elevated myocardial infarction) ([place]), 05/2018   . Chest pain 06/05/2018  . Troponin level elevated 06/05/2018  . Noncompliance 11/05/2017  . Genital herpes 08/02/2016  . Pain medication agreement signed 05/01/2016  . Uncomplicated opioid dependence ([place]) 05/01/2016  . Chronic back pain 08/10/2015  . 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 Depression 11/15/2014  . GERD (gastroesophageal reflux disease) 11/15/2014  . Hypertension associated with diabetes ([place])   . Hyperlipidemia associated with type 2 diabetes mellitus ([place])   . Type II diabetes mellitus ([place])   . Anxiety   . Obesity, morbid, BMI 40.0-49.9 ([place])   . Allergy   . Asthma   . Vitamin D deficiency   . Neuropathy   . Hypothyroidism 02/25/2013   PCP:  [name], FNP Pharmacy:   [place], [place] [address] Phone: [phone] Fax: [phone]  [facility] [place], [place] [address] Phone: [phone] Fax: [phone]  [id number] [place], [place] [address] Phone: [phone] Fax: [phone]     Social  Determinants of Health (SDOH) Interventions    Readmission Risk Interventions Readmission Risk Prevention Plan 12/10/2018  Transportation Screening Complete  Medication Review ([place]) Complete  PCP or Specialist appointment within 3-5 days of discharge Complete  HRI or [place] Complete  SW Recovery Care/Counseling Consult Complete  [place] Patient Refused  Some recent data might be hidden

## 2018-12-11 NOTE — Progress Notes (Addendum)
Physical Therapy Treatment Patient Details Name: Mary Raymond MRN: [id number] DOB: 02/01/1951 Today's Date: 12/11/2018    History of Present Illness Pt is a 68 y/o female admitted secondary to worsening SOB thought to be secondary to CHF. Pt also with severe CAD, however, is not a surgical candidate and per cardiology notes, will be managed medically. PMH includes DM, anxiety, CAD, asthma, CVA, MI, pacemaker, CKD.     PT Comments    Pt progressing steadily towards her physical therapy goals. Increased ambulation distance to 40 ft x 2 using walker and min guard assist. SpO2 > 90% on RA, but pt requesting oxygen upon return to room for recovery. Pt remains limited by back pain, decreased endurance, and generalized weakness. Pt now agreeable to SNF and think this is appropriate recommendation to maximize functional independence and reduce caregiver burden.    Follow Up Recommendations  SNF     Equipment Recommendations  None recommended by PT    Recommendations for Other Services OT consult     Precautions / Restrictions Precautions Precautions: Fall Restrictions Weight Bearing Restrictions: No    Mobility  Bed Mobility Overal bed mobility: Needs Assistance Bed Mobility: Supine to Sit Rolling: Supervision Sidelying to sit: HOB elevated;Supervision       General bed mobility comments: Pt requesting HOB be significantly elevated and demonstrates increased time/effort with supine > sit with heavy use of bed rail  Transfers Overall transfer level: Needs assistance Equipment used: Rolling walker (2 wheeled) Transfers: Sit to/from [facility] Sit to Stand: Supervision Stand pivot transfers: Min guard       General transfer comment: Supervision for sit > stand, min guard for stand pivot transfer without assistive device from bed > BSC  Ambulation/Gait Ambulation/Gait assistance: Min guard Gait Distance (Feet): 40 Feet(x2) Assistive device: Rolling  walker (2 wheeled) Gait Pattern/deviations: Step-through pattern;Decreased stride length;Trunk flexed Gait velocity: decr Gait velocity interpretation: <1.31 ft/sec, indicative of household ambulator General Gait Details: Pt requiring min guard for stability, fatiguing easily and reporting increased back pain with mobility and leaning forearms on walker for "standing" rest break. Cues for upright posture. Heavy reliance through BUE's on walker   Stairs             Wheelchair Mobility    Modified Rankin (Stroke Patients Only)       Balance Overall balance assessment: Needs assistance Sitting-balance support: No upper extremity supported;Feet supported Sitting balance-Leahy Scale: Good     Standing balance support: Bilateral upper extremity supported;During functional activity Standing balance-Leahy Scale: Poor Standing balance comment: Reliant on BUE support.                             Cognition Arousal/Alertness: Awake/alert Behavior During Therapy: WFL for tasks assessed/performed Overall Cognitive Status: Within Functional Limits for tasks assessed                                        Exercises      General Comments        Pertinent Vitals/Pain Pain Assessment: Faces Faces Pain Scale: Hurts even more Pain Location: back pain Pain Descriptors / Indicators: Aching;Grimacing;Guarding Pain Intervention(s): Monitored during session;Limited activity within patient's tolerance    Home Living                      Prior  Function            PT Goals (current goals can now be found in the care plan section) Acute Rehab PT Goals Patient Stated Goal: to go home PT Goal Formulation: With patient Time For Goal Achievement: 12/24/18 Potential to Achieve Goals: Good Progress towards PT goals: Progressing toward goals    Frequency    Min 3X/week      PT Plan Current plan remains appropriate    Co-evaluation               AM-PAC PT "6 Clicks" Mobility   Outcome Measure  Help needed turning from your back to your side while in a flat bed without using bedrails?: A Little Help needed moving from lying on your back to sitting on the side of a flat bed without using bedrails?: A Lot Help needed moving to and from a bed to a chair (including a wheelchair)?: A Little Help needed standing up from a chair using your arms (e.g., wheelchair or bedside chair)?: A Little Help needed to walk in hospital room?: A Little Help needed climbing 3-5 steps with a railing? : A Lot 6 Click Score: 16    End of Session Equipment Utilized During Treatment: Gait belt Activity Tolerance: Patient tolerated treatment well Patient left: in chair;with call bell/phone within reach Nurse Communication: Mobility status PT Visit Diagnosis: Other abnormalities of gait and mobility (R26.89);Unsteadiness on feet (R26.81);Muscle weakness (generalized) (M62.81);Pain Pain - part of body: (back)     Time: 9357-0177 PT Time Calculation (min) (ACUTE ONLY): 25 min  Charges:  $Gait Training: 8-22 mins $Therapeutic Activity: 8-22 mins                     Ellamae Sia, PT, DPT Acute Rehabilitation Services Pager [phone] Office [phone]    Willy Eddy 12/11/2018, 9:12 AM

## 2018-12-12 LAB — CBC
HCT: 28.7 % — ABNORMAL LOW (ref 36.0–46.0)
Hemoglobin: 8.7 g/dL — ABNORMAL LOW (ref 12.0–15.0)
MCH: 24.9 pg — AB (ref 26.0–34.0)
MCHC: 30.3 g/dL (ref 30.0–36.0)
MCV: 82 fL (ref 80.0–100.0)
Platelets: 243 10*3/uL (ref 150–400)
RBC: 3.5 MIL/uL — ABNORMAL LOW (ref 3.87–5.11)
RDW: 15.8 % — ABNORMAL HIGH (ref 11.5–15.5)
WBC: 14.1 10*3/uL — ABNORMAL HIGH (ref 4.0–10.5)
nRBC: 0 % (ref 0.0–0.2)

## 2018-12-12 LAB — GLUCOSE, CAPILLARY
GLUCOSE-CAPILLARY: 349 mg/dL — AB (ref 70–99)
Glucose-Capillary: 325 mg/dL — ABNORMAL HIGH (ref 70–99)
Glucose-Capillary: 347 mg/dL — ABNORMAL HIGH (ref 70–99)
Glucose-Capillary: 370 mg/dL — ABNORMAL HIGH (ref 70–99)

## 2018-12-12 LAB — BASIC METABOLIC PANEL
Anion gap: 14 (ref 5–15)
BUN: 51 mg/dL — ABNORMAL HIGH (ref 8–23)
CO2: 28 mmol/L (ref 22–32)
Calcium: 9.1 mg/dL (ref 8.9–10.3)
Chloride: 92 mmol/L — ABNORMAL LOW (ref 98–111)
Creatinine, Ser: 2.4 mg/dL — ABNORMAL HIGH (ref 0.44–1.00)
GFR calc Af Amer: 23 mL/min — ABNORMAL LOW (ref >60)
GFR, EST NON AFRICAN AMERICAN: 20 mL/min — AB (ref >60)
Glucose, Bld: 346 mg/dL — ABNORMAL HIGH (ref 70–99)
Potassium: 4 mmol/L (ref 3.5–5.1)
Sodium: 134 mmol/L — ABNORMAL LOW (ref 135–145)

## 2018-12-12 MED ORDER — SENNOSIDES-DOCUSATE SODIUM 8.6-50 MG PO TABS
2.0000 | ORAL_TABLET | Freq: Once | ORAL | Status: AC
  Administered 2018-12-12: 2 via ORAL
  Filled 2018-12-12: qty 2

## 2018-12-12 MED ORDER — INSULIN GLARGINE 100 UNIT/ML ~~LOC~~ SOLN
25.0000 [IU] | Freq: Two times a day (BID) | SUBCUTANEOUS | Status: DC
  Administered 2018-12-12 (×2): 25 [IU] via SUBCUTANEOUS
  Filled 2018-12-12 (×3): qty 0.25

## 2018-12-12 MED ORDER — INSULIN ASPART 100 UNIT/ML ~~LOC~~ SOLN
5.0000 [IU] | Freq: Three times a day (TID) | SUBCUTANEOUS | Status: DC
  Administered 2018-12-12 – 2018-12-13 (×3): 5 [IU] via SUBCUTANEOUS

## 2018-12-12 NOTE — Progress Notes (Addendum)
Mary Raymond  OVF:[id number] DOB: September 20, 1950 DOA: 12/08/2018 [name], FNP    Brief Narrative:  [phone] w/ a hx of anxiety, osteoarthritis, asthma, chronic back pain, CAD, diabetic peripheral neuropathy, DM2, fibromyalgia, GERD, hyperlipidemia, hypertension, hypothyroidism, migraine headaches, CAD, obesity, occipital CVA, and vitamin D deficiency who presented w/ chest pressure and SOB along w/ lower extremity edema and orthopnea.  Awaiting bed at SNF.     Subjective: Still feeling short of breath and c/o low back pain  Assessment & Plan:  Chest pain - Multivessel CAD s/p DES x 2 to the RCA with residual unrevascularized LM and LCx disease Not felt to be a candidate for CABG due to very limited functional state -cardiology consult appreciated  Recent Labs  Lab 12/08/18 1939 12/09/18 0220 12/09/18 0718  TROPONINI 0.34* 0.33* 0.35*    Dyspnea - Volume overload EF 60-65% via TTE Sept 19 - CXR c/w pulmonary edema - baseline wgt ~123kg, but at times as low as 118 - diurese - follow renal fxn   Filed Weights   12/10/18 0500 12/11/18 0640 12/12/18 0434  Weight: 123 kg 120.7 kg 120.7 kg    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 Rayann Heman  CKD Stage 3 Baseline crt appears to range from 1.25-1.99  -change lasix to PO  Hypothyroidism Continue levothyroxine  Hyperlipidemia Continue rosuvastatin  DM2 -continue to titrate her lantus for better control -SSI -add meal coverage  Anxiety Continue alprazolam   GERD Cont Protonix  Normocytic Anemia Secondary to CKD - baseline Hgb ~9.0 as of Feb 2020   Obesity Estimated body mass index is 44.28 kg/m as calculated from the following:   Height as of this encounter: 5\' 5"  (1.651 m).   Weight as of this encounter: 120.7 kg.  Overall poor prognosis  PT/OT evaluations performed. SNF recommended. SNF appropriate as the patient has received 3 days of hospital care (or the  3 day period has been waived by the patient's insurance company) and is felt to need rehab services to restore this patient to their prior level of function to achieve safe transition back to home care. This patient needs rehab services for at least 5 days per week and skilled nursing services daily to facilitate this transition, and is able to participate in at least 1 hour of rehab services daily.  Rehab is being requested as the most appropriate d/c option for this patient and is NOT felt to be for custodial care as evidenced by doing her own ADLs prior to admission. Rehab should be able to restore this patient to his/her prior level of function.  DVT prophylaxis: heparin  Code Status: FULL CODE Family Communication: no family present at time of exam  Disposition Plan: SNF when bed available  Consultants:  Cardiology   Objective: Blood pressure (!) 113/57, pulse 78, temperature 97.7 F (36.5 C), temperature source Oral, resp. rate 20, height 5\' 5"  (1.651 m), weight 120.7 kg, SpO2 95 %.  Intake/Output Summary (Last 24 hours) at 12/12/2018 1001 Last data filed at 12/12/2018 0900 Gross per 24 hour  Intake 1860 ml  Output 2350 ml  Net -490 ml   Filed Weights   12/10/18 0500 12/11/18 0640 12/12/18 0434  Weight: 123 kg 120.7 kg 120.7 kg    Examination: In bed, NAD Chronically ill appearing Diminished breath sounds rrr Moves all 4 ext   CBC: Recent Labs  Lab 12/08/18 1939 12/09/18 0220 12/09/18 0718 12/10/18 0322 12/12/18  0210  WBC 14.1* 15.5*  --  13.9* 14.1*  NEUTROABS 10.6*  --   --   --   --   HGB 8.7* 8.1* 8.4* 8.4* 8.7*  HCT 29.5* 27.6* 28.5* 28.9* 28.7*  MCV 82.9 81.4  --  82.6 82.0  PLT 262 243  --  265 093   Basic Metabolic Panel: Recent Labs  Lab 12/08/18 1939 12/10/18 0322 12/11/18 0749 12/12/18 0210  NA 139 134* 135 134*  K 3.9 3.9 4.2 4.0  CL 105 95* 94* 92*  CO2 22 30 27 28   GLUCOSE 214* 239* 424* 346*  BUN 42* 40* 47* 51*  CREATININE 1.88* 2.47*  2.52* 2.40*  CALCIUM 9.2 9.0 8.9 9.1  MG 1.8  --   --   --    GFR: Estimated Creatinine Clearance: 29.6 mL/min (A) (by C-G formula based on SCr of 2.4 mg/dL (H)).  Liver Function Tests: Recent Labs  Lab 12/10/18 0322  AST 14*  ALT 14  ALKPHOS 94  BILITOT 0.6  PROT 6.9  ALBUMIN 2.9*   No results for input(s): LIPASE, AMYLASE in the last 168 hours. No results for input(s): AMMONIA in the last 168 hours.  Coagulation Profile: No results for input(s): INR, PROTIME in the last 168 hours.  Cardiac Enzymes: Recent Labs  Lab 12/08/18 1939 12/09/18 0220 12/09/18 0718  TROPONINI 0.34* 0.33* 0.35*    HbA1C: HB A1C (BAYER DCA - WAIVED)  Date/Time Value Ref Range Status  10/27/2018 11:33 AM 8.8 (H) <7.0 % Final    Comment:                                          Diabetic Adult            <7.0                                       Healthy Adult        4.3 - 5.7                                                           (DCCT/NGSP) American Diabetes Association's Summary of Glycemic Recommendations for Adults with Diabetes: Hemoglobin A1c <7.0%. More stringent glycemic goals (A1c <6.0%) may further reduce complications at the cost of increased risk of hypoglycemia.   02/25/2018 10:33 AM 8.3 (H) <7.0 % Final    Comment:                                          Diabetic Adult            <7.0                                       Healthy Adult        4.3 - 5.7                                                           (  DCCT/NGSP) American Diabetes Association's Summary of Glycemic Recommendations for Adults with Diabetes: Hemoglobin A1c <7.0%. More stringent glycemic goals (A1c <6.0%) may further reduce complications at the cost of increased risk of hypoglycemia.    Hgb A1c MFr Bld  Date/Time Value Ref Range Status  06/05/2018 07:47 PM 7.3 (H) 4.8 - 5.6 % Final    Comment:    (NOTE) Pre diabetes:          5.7%-6.4% Diabetes:              >6.4% Glycemic control for   <7.0%  adults with diabetes     CBG: Recent Labs  Lab 12/11/18 0641 12/11/18 1038 12/11/18 1610 12/11/18 2109 12/12/18 0615  GLUCAP 322* 363* 357* 421* 325*    Recent Results (from the past 240 hour(s))  MRSA PCR Screening     Status: None   Collection Time: 12/08/18 11:58 PM  Result Value Ref Range Status   MRSA by PCR NEGATIVE NEGATIVE Final    Comment:        The GeneXpert MRSA Assay (FDA approved for NASAL specimens only), is one component of a comprehensive MRSA colonization surveillance program. It is not intended to diagnose MRSA infection nor to guide or monitor treatment for MRSA infections. Performed at [facility], [place] [address], [place], [place] [address]      Scheduled Meds: . acyclovir  400 mg Oral Daily  . aspirin EC  81 mg Oral Daily  . clopidogrel  75 mg Oral Daily  . furosemide  80 mg Oral Daily  . gabapentin  100 mg Oral TID  . heparin injection (subcutaneous)  5,000 Units Subcutaneous Q8H  . insulin aspart  0-20 Units Subcutaneous TID WC  . insulin aspart  0-5 Units Subcutaneous QHS  . insulin aspart  5 Units Subcutaneous TID WC  . insulin glargine  25 Units Subcutaneous BID  . levothyroxine  50 mcg Oral Q0600  . losartan  25 mg Oral Daily  . mouth rinse  15 mL Mouth Rinse BID  . montelukast  10 mg Oral QHS  . pantoprazole  40 mg Oral Daily  . polyethylene glycol  17 g Oral Daily  . rosuvastatin  40 mg Oral Daily   Continuous Infusions:    LOS: 4 days    Geradine Girt DO  [facility] 12/12/2018, 10:01 AM  If 7PM-7AM, please contact night-coverage per Amion 12/12/2018, 10:01 AM

## 2018-12-12 NOTE — Progress Notes (Signed)
Inpatient Diabetes Program Recommendations  AACE/ADA: New Consensus Statement on Inpatient Glycemic Control (2015)  Target Ranges:  Prepandial:   less than 140 mg/dL      Peak postprandial:   less than 180 mg/dL (1-2 hours)      Critically ill patients:  140 - 180 mg/dL   Lab Results  Component Value Date   GLUCAP 325 (H) 12/12/2018   HGBA1C 8.8 (H) 10/27/2018    Review of Glycemic Control Results for Mary Raymond, Mary Raymond (MRN [id number]) as of 12/11/2018 09:34  Ref. Range 12/10/2018 06:51 12/10/2018 11:04 12/10/2018 17:09 12/10/2018 21:13 12/11/2018 06:41  Glucose-Capillary Latest Ref Range: 70 - 99 mg/dL 204 (H) 276 (H) 259 (H) 317 (H) 322 (H)   Diabetes history: DM 2  Outpatient Diabetes medications:  Lantus 28 units bid, Metformin 1000 mg bid, Ozempic 0.25 mg daily Current orders for Inpatient glycemic control:  Novolog resistant tid with meals and HS + Novolog 5 units tid meal coverage, Lantus 25 units bid  Inpatient Diabetes Program Recommendations:    -Noted Lantus increased to 25 units BID, consider increasing to home dose 28 units BID if trends continue to be elevated.  -May consider increasing Novolog meal coverage to 8 units tid with meals (hold if patient eats less than 50%).   Thanks,  Tama Headings RN, MSN, BC-ADM Inpatient Diabetes Coordinator Team Pager [phone] (8a-5p)

## 2018-12-12 NOTE — TOC Progression Note (Addendum)
Transition of Care [facility]) - Progression Note    Patient Details  Name: Mary Raymond MRN: [id number] Date of Birth: [birth year]  Transition of Care [facility]) CM/SW [place], [place] Phone Number: [phone] 12/12/2018, 2:59 PM  Clinical Narrative:     3:30pm- CSW updated MD on peer to peer request   CSW was informed SNF authorization is pending (needs more clinical support for auth) but a peer to peer needs to be schedule or completed by the MD by 3pm on 12/15/2018, [phone] extension [phone]  Expected Discharge Plan: [place] Barriers to Discharge: Insurance Authorization  Expected Discharge Plan and Services Expected Discharge Plan: [place]       Living arrangements for the past 2 months: [place] Determinants of Health (SDOH) Interventions    Readmission Risk Interventions Readmission Risk Prevention Plan 12/10/2018  Transportation Screening Complete  Medication Review ([place]) Complete  PCP or Specialist appointment within 3-5 days of discharge Complete  HRI or [place] Complete  SW Recovery Care/Counseling Consult Complete  [place] Patient Refused  Some recent data might be hidden

## 2018-12-12 NOTE — Progress Notes (Signed)
Called to do peer to peer- MD not available.  Left Dr. Thompson Caul name and our office # for the MD to call when they become available. Eulogio Bear DO

## 2018-12-13 LAB — GLUCOSE, CAPILLARY
Glucose-Capillary: 250 mg/dL — ABNORMAL HIGH (ref 70–99)
Glucose-Capillary: 306 mg/dL — ABNORMAL HIGH (ref 70–99)
Glucose-Capillary: 348 mg/dL — ABNORMAL HIGH (ref 70–99)
Glucose-Capillary: 344 mg/dL — ABNORMAL HIGH (ref 70–99)

## 2018-12-13 MED ORDER — INSULIN ASPART 100 UNIT/ML ~~LOC~~ SOLN
8.0000 [IU] | Freq: Three times a day (TID) | SUBCUTANEOUS | Status: DC
  Administered 2018-12-13 – 2018-12-15 (×6): 8 [IU] via SUBCUTANEOUS

## 2018-12-13 MED ORDER — INSULIN GLARGINE 100 UNIT/ML ~~LOC~~ SOLN
28.0000 [IU] | Freq: Two times a day (BID) | SUBCUTANEOUS | Status: DC
  Filled 2018-12-13: qty 0.28

## 2018-12-13 MED ORDER — INSULIN GLARGINE 100 UNIT/ML ~~LOC~~ SOLN
35.0000 [IU] | Freq: Two times a day (BID) | SUBCUTANEOUS | Status: DC
  Administered 2018-12-13: 35 [IU] via SUBCUTANEOUS
  Filled 2018-12-13 (×2): qty 0.35

## 2018-12-13 MED ORDER — INSULIN GLARGINE 100 UNIT/ML ~~LOC~~ SOLN
35.0000 [IU] | Freq: Once | SUBCUTANEOUS | Status: AC
  Administered 2018-12-13: 35 [IU] via SUBCUTANEOUS
  Filled 2018-12-13: qty 0.35

## 2018-12-13 NOTE — Progress Notes (Addendum)
PROGRESS NOTE    Mary Raymond  ZDG:[id number] DOB: [birth year] DOA: 12/08/2018 [name], FNP     Brief Narrative:  Mary Raymond is a 68 year old female with past medical history significant for CAD, diabetic peripheral neuropathy, uncontrolled diabetes with hyperglycemia, hyperlipidemia, hypertension, hypothyroidism, obesity, osteoarthritis, chronic back pain, fibromyalgia, vitamin D deficiency who presented with chest pressure and shortness of breath as well as lower extremity edema and orthopnea.  She was admitted for acute on chronic combined systolic and diastolic heart failure, given IV diuretic.  Needed by cardiology they determined that she has severe disease involving the distal left main and circumflex.  They recommended medical treatment.   New events last 24 hours / Subjective: No new complaints overnight.  Sitting in chair, continues to be orthopneic at times.  Denies any chest pain, nausea, vomiting or abdominal pain today.  Assessment & Plan:   Principal Problem:   Acute coronary syndrome (HCC) Active Problems:   Hypothyroidism   Hyperlipidemia associated with type 2 diabetes mellitus (HCC)   Type II diabetes mellitus (HCC)   Anxiety   GERD (gastroesophageal reflux disease)   Troponin level elevated   Anemia   Acute on chronic congestive heart failure (HCC)   Acute on chronic diastolic heart failure -EF 55-60%  -Appreciate cardiology, continue Lasix 80 mg daily, Cozaar  -Has diuresed -5.8L since admission  -Weight 278lb --> 266lb   Coronary artery disease -Multivessel CAD s/p DES x 2 to the RCA with residual unrevascularized LM and LCx disease -Not felt to be a candidate for CABG due to very limited functional state -Appreciate cardiology -Continue aspirin, Plavix  Chronic kidney disease stage IV  -Baseline Cr appears to range from 1.25-1.99  -Stable in the 2.4-2.5 now, may be new normal   Symptomatic 2nd degree heart block -S/p  pacemaker implant October of 2019 -Followed by Dr. Rayann Heman  Hypothyroidism -Continue levothyroxine  Hyperlipidemia -Continuerosuvastatin  DM2 with hyperglycemia  -Lantus, SSI, Novolog. Dose increased today   Anxiety -Continue alprazolam   GERD -Continue Protonix  Normocytic Anemia -Secondary to CKD - baseline Hgb ~9.0 as of Feb 2020   Morbid obesity -Body mass index is 44.35 kg/m.   DVT prophylaxis: Subcutaneous heparin Code Status: Full code Family Communication: Spoke with daughter over the phone today  Disposition Plan: Pending SNF insurance Auth.  Attempted to call peer-to-peer today, need patient's member ID and reference number.  Asked social work to provide this so I can complete peer to peer.     PT/OT evaluation performed.  SNF recommended.  SNF is appropriate as the patient has received 4 days of hospital care and is felt to need rehab services to restore this patient to the prior level of function to achieve safe transition back to home care given.  This patient needs rehab services for at least 5 days/week and skilled nursing services daily to facilitate this transition.  Rehab is being requested as the most appropriate discharge option for this patient and is not felt to be for custodial care as evidenced by independent of own ADL prior to admission. Short term rehab should be able to restore this patient to her prior level of function.    Consultants:   Cardiology  Procedures:   None  Antimicrobials:  Anti-infectives (From admission, onward)   Start     Dose/Rate Route Frequency Ordered Stop   12/09/18 1000  acyclovir (ZOVIRAX) tablet 400 mg     400 mg Oral Daily 12/09/18 0018  Objective: Vitals:   12/13/18 0320 12/13/18 0613 12/13/18 0738 12/13/18 0843  BP: 135/68  (!) 126/56   Pulse: 76  81   Resp: (!) 27  (!) 26 18  Temp: 98.2 F (36.8 C)  97.9 F (36.6 C)   TempSrc: Oral  Oral   SpO2: 99%  96% 94%  Weight:  120.9 kg     Height:        Intake/Output Summary (Last 24 hours) at 12/13/2018 0933 Last data filed at 12/13/2018 0738 Gross per 24 hour  Intake 1420 ml  Output 1795 ml  Net -375 ml   Filed Weights   12/11/18 0640 12/12/18 0434 12/13/18 9563  Weight: 120.7 kg 120.7 kg 120.9 kg    Examination:  General exam: Appears calm and comfortable  Respiratory system: Diminished breath sounds bilaterally, clear Cardiovascular system: S1 & S2 heard, RRR. No JVD, murmurs, rubs, gallops or clicks. No pedal edema. Gastrointestinal system: Abdomen is nondistended, soft and nontender. No organomegaly or masses felt. Normal bowel sounds heard. Central nervous system: Alert and oriented. No focal neurological deficits. Extremities: Symmetric 5 x 5 power. Skin: No rashes, lesions or ulcers Psychiatry: Judgement and insight appear normal. Mood & affect appropriate.   Data Reviewed: I have personally reviewed following labs and imaging studies  CBC: Recent Labs  Lab 12/08/18 1939 12/09/18 0220 12/09/18 0718 12/10/18 0322 12/12/18 0210  WBC 14.1* 15.5*  --  13.9* 14.1*  NEUTROABS 10.6*  --   --   --   --   HGB 8.7* 8.1* 8.4* 8.4* 8.7*  HCT 29.5* 27.6* 28.5* 28.9* 28.7*  MCV 82.9 81.4  --  82.6 82.0  PLT 262 243  --  265 875   Basic Metabolic Panel: Recent Labs  Lab 12/08/18 1939 12/10/18 0322 12/11/18 0749 12/12/18 0210  NA 139 134* 135 134*  K 3.9 3.9 4.2 4.0  CL 105 95* 94* 92*  CO2 22 30 27 28   GLUCOSE 214* 239* 424* 346*  BUN 42* 40* 47* 51*  CREATININE 1.88* 2.47* 2.52* 2.40*  CALCIUM 9.2 9.0 8.9 9.1  MG 1.8  --   --   --    GFR: Estimated Creatinine Clearance: 29.7 mL/min (A) (by C-G formula based on SCr of 2.4 mg/dL (H)). Liver Function Tests: Recent Labs  Lab 12/10/18 0322  AST 14*  ALT 14  ALKPHOS 94  BILITOT 0.6  PROT 6.9  ALBUMIN 2.9*   No results for input(s): LIPASE, AMYLASE in the last 168 hours. No results for input(s): AMMONIA in the last 168 hours. Coagulation  Profile: No results for input(s): INR, PROTIME in the last 168 hours. Cardiac Enzymes: Recent Labs  Lab 12/08/18 1939 12/09/18 0220 12/09/18 0718  TROPONINI 0.34* 0.33* 0.35*   BNP (last 3 results) No results for input(s): PROBNP in the last 8760 hours. HbA1C: No results for input(s): HGBA1C in the last 72 hours. CBG: Recent Labs  Lab 12/12/18 0615 12/12/18 1124 12/12/18 1617 12/12/18 2054 12/13/18 0610  GLUCAP 325* 349* 347* 370* 250*   Lipid Profile: No results for input(s): CHOL, HDL, LDLCALC, TRIG, CHOLHDL, LDLDIRECT in the last 72 hours. Thyroid Function Tests: No results for input(s): TSH, T4TOTAL, FREET4, T3FREE, THYROIDAB in the last 72 hours. Anemia Panel: No results for input(s): VITAMINB12, FOLATE, FERRITIN, TIBC, IRON, RETICCTPCT in the last 72 hours. Sepsis Labs: No results for input(s): PROCALCITON, LATICACIDVEN in the last 168 hours.  Recent Results (from the past 240 hour(s))  MRSA PCR Screening  Status: None   Collection Time: 12/08/18 11:58 PM  Result Value Ref Range Status   MRSA by PCR NEGATIVE NEGATIVE Final    Comment:        The GeneXpert MRSA Assay (FDA approved for NASAL specimens only), is one component of a comprehensive MRSA colonization surveillance program. It is not intended to diagnose MRSA infection nor to guide or monitor treatment for MRSA infections. Performed at [facility], [place] [address], [place], [place] [address]        Radiology Studies: No results found.    Scheduled Meds: . acyclovir  400 mg Oral Daily  . aspirin EC  81 mg Oral Daily  . clopidogrel  75 mg Oral Daily  . furosemide  80 mg Oral Daily  . gabapentin  100 mg Oral TID  . heparin injection (subcutaneous)  5,000 Units Subcutaneous Q8H  . insulin aspart  0-20 Units Subcutaneous TID WC  . insulin aspart  0-5 Units Subcutaneous QHS  . insulin aspart  8 Units Subcutaneous TID WC  . insulin glargine  28 Units Subcutaneous BID  .  levothyroxine  50 mcg Oral Q0600  . losartan  25 mg Oral Daily  . mouth rinse  15 mL Mouth Rinse BID  . montelukast  10 mg Oral QHS  . pantoprazole  40 mg Oral Daily  . polyethylene glycol  17 g Oral Daily  . rosuvastatin  40 mg Oral Daily   Continuous Infusions:   LOS: 5 days    Time spent: 35 minutes   Dessa Phi, DO [facility] [email or web address] 12/13/2018, 9:33 AM

## 2018-12-13 NOTE — Progress Notes (Signed)
MD called this Probation officer for pt's subscriber number [phone]) for peer to peer request for SNF rehab. Will continue to assist as needed.

## 2018-12-13 NOTE — Evaluation (Signed)
Occupational Therapy Evaluation Patient Details Name: Mary Raymond MRN: [id number] DOB: 1950-11-05 Today's Date: 12/13/2018    History of Present Illness Pt is a 68 y/o female admitted secondary to worsening SOB thought to be secondary to CHF. Pt also with severe CAD, however, is not a surgical candidate and per cardiology notes, will be managed medically. PMH includes DM, anxiety, CAD, asthma, CVA, MI, pacemaker, CKD.    Clinical Impression   PTA, pt was living with her daughter and was independent; reports fear of falling and history of falling in tub. Pt currently requiring Min Guard A for LB ADLs, toileting, and functional transfers. Pt presenting with poor activity tolerance as seen by fatigue and decreased SpO2. Pt's SpO2 dropping to 86% on RA during activity; placed back on 2L and she returned to 90s. Pt would benefit from further acute OT to facilitate safe dc. Recommend dc to SNF for further OT to optimize safety, independence with ADLs, and return to PLOF.      Follow Up Recommendations  SNF;Supervision/Assistance - 24 hour    Equipment Recommendations  None recommended by OT    Recommendations for Other Services PT consult     Precautions / Restrictions Precautions Precautions: Fall Restrictions Weight Bearing Restrictions: No      Mobility Bed Mobility               General bed mobility comments: In recliner upon arrival  Transfers Overall transfer level: Needs assistance Equipment used: Rolling walker (2 wheeled) Transfers: Sit to/from [facility] Sit to Stand: Min guard         General transfer comment: Min Guard A for safety    Balance Overall balance assessment: Needs assistance Sitting-balance support: No upper extremity supported;Feet supported Sitting balance-Leahy Scale: Good     Standing balance support: Bilateral upper extremity supported;During functional activity Standing balance-Leahy Scale: Fair                             ADL either performed or assessed with clinical judgement   ADL Overall ADL's : Needs assistance/impaired Eating/Feeding: Independent   Grooming: Supervision/safety;Set up;Wash/dry hands;Sitting   Upper Body Bathing: Supervision/ safety;Set up;Sitting   Lower Body Bathing: Min guard;Sit to/from stand   Upper Body Dressing : Supervision/safety;Set up;Sitting   Lower Body Dressing: Min guard;Sit to/from stand Lower Body Dressing Details (indicate cue type and reason): able to don new socks Toilet Transfer: Min guard;Ambulation;RW;Regular Glass blower/designer Details (indicate cue type and reason): Min Guard A for safety         Functional mobility during ADLs: Min guard;Rolling walker General ADL Comments: Pt with urinary incontience and requesting to go to the bathroom upon arrival. Min guard A for safety. Decreased activity tolerance as seen by drops in SpO2. Dropping to 86% on RA during activity     Vision         Perception     Praxis      Pertinent Vitals/Pain Pain Assessment: Faces Faces Pain Scale: Hurts even more Pain Location: back pain Pain Descriptors / Indicators: Aching;Grimacing;Guarding Pain Intervention(s): Monitored during session;Limited activity within patient's tolerance;Repositioned     Hand Dominance Right   Extremity/Trunk Assessment Upper Extremity Assessment Upper Extremity Assessment: Generalized weakness   Lower Extremity Assessment Lower Extremity Assessment: Defer to PT evaluation   Cervical / Trunk Assessment Cervical / Trunk Assessment: Other exceptions Cervical / Trunk Exceptions: chronic back pain    Communication Communication  Communication: No difficulties   Cognition Arousal/Alertness: Awake/alert Behavior During Therapy: WFL for tasks assessed/performed Overall Cognitive Status: Within Functional Limits for tasks assessed                                     General Comments   SpO2 dropping to 86% on RA. Placed back on 2L and pt returning to 90s    Exercises     Shoulder Instructions      Home Living Family/patient expects to be discharged to:: Private residence Living Arrangements: Children Available Help at Discharge: Family;Available 24 hours/day Type of Home: House Home Access: Stairs to enter [employer] of Steps: 3-4 Entrance Stairs-Rails: None Home Layout: One level     Bathroom Shower/Tub: Corporate investment banker: Standard     Home Equipment: Shower seat;Cane - single point;Walker - 2 wheels          Prior Functioning/Environment Level of Independence: Independent with assistive device(s)        Comments: Reports she was independent with use of RW. Reports fear of falling getting into and out of tub.         OT Problem List: Decreased strength;Decreased activity tolerance;Impaired balance (sitting and/or standing);Decreased knowledge of use of DME or AE;Decreased knowledge of precautions      OT Treatment/Interventions: Self-care/ADL training;Therapeutic exercise;Energy conservation;DME and/or AE instruction;Therapeutic activities;Patient/family education    OT Goals(Current goals can be found in the care plan section) Acute Rehab OT Goals Patient Stated Goal: to go home OT Goal Formulation: With patient Time For Goal Achievement: 12/27/18 Potential to Achieve Goals: Good  OT Frequency: Min 2X/week   Barriers to D/C:            Co-evaluation              AM-PAC OT "6 Clicks" Daily Activity     Outcome Measure Help from another person eating meals?: None Help from another person taking care of personal grooming?: A Little Help from another person toileting, which includes using toliet, bedpan, or urinal?: A Little Help from another person bathing (including washing, rinsing, drying)?: A Little Help from another person to put on and taking off regular upper body clothing?: None Help  from another person to put on and taking off regular lower body clothing?: A Little 6 Click Score: 20   End of Session Equipment Utilized During Treatment: Oxygen;Rolling walker Nurse Communication: Mobility status  Activity Tolerance: Patient tolerated treatment well Patient left: in chair;with call bell/phone within reach;with nursing/sitter in room  OT Visit Diagnosis: Unsteadiness on feet (R26.81);Other abnormalities of gait and mobility (R26.89);Muscle weakness (generalized) (M62.81);History of falling (Z91.81)                Time: 8280-0349 OT Time Calculation (min): 22 min Charges:  OT General Charges $OT Visit: 1 Visit OT Evaluation $OT Eval Low Complexity: Lyndon, OTR/L Acute Rehab Pager: [phone] Office: [place] 12/13/2018, 3:50 PM

## 2018-12-14 LAB — BASIC METABOLIC PANEL
Anion gap: 12 (ref 5–15)
BUN: 56 mg/dL — AB (ref 8–23)
CO2: 29 mmol/L (ref 22–32)
Calcium: 9.3 mg/dL (ref 8.9–10.3)
Chloride: 94 mmol/L — ABNORMAL LOW (ref 98–111)
Creatinine, Ser: 1.86 mg/dL — ABNORMAL HIGH (ref 0.44–1.00)
GFR calc Af Amer: 32 mL/min — ABNORMAL LOW (ref >60)
GFR calc non Af Amer: 28 mL/min — ABNORMAL LOW (ref >60)
GLUCOSE: 295 mg/dL — AB (ref 70–99)
Potassium: 4 mmol/L (ref 3.5–5.1)
Sodium: 135 mmol/L (ref 135–145)

## 2018-12-14 LAB — GLUCOSE, CAPILLARY
GLUCOSE-CAPILLARY: 380 mg/dL — AB (ref 70–99)
Glucose-Capillary: 327 mg/dL — ABNORMAL HIGH (ref 70–99)
Glucose-Capillary: 360 mg/dL — ABNORMAL HIGH (ref 70–99)
Glucose-Capillary: 398 mg/dL — ABNORMAL HIGH (ref 70–99)

## 2018-12-14 LAB — CBC
HCT: 29.6 % — ABNORMAL LOW (ref 36.0–46.0)
Hemoglobin: 9.1 g/dL — ABNORMAL LOW (ref 12.0–15.0)
MCH: 25 pg — ABNORMAL LOW (ref 26.0–34.0)
MCHC: 30.7 g/dL (ref 30.0–36.0)
MCV: 81.3 fL (ref 80.0–100.0)
Platelets: 256 10*3/uL (ref 150–400)
RBC: 3.64 MIL/uL — ABNORMAL LOW (ref 3.87–5.11)
RDW: 15.9 % — AB (ref 11.5–15.5)
WBC: 13.3 10*3/uL — ABNORMAL HIGH (ref 4.0–10.5)
nRBC: 0 % (ref 0.0–0.2)

## 2018-12-14 MED ORDER — INSULIN GLARGINE 100 UNIT/ML ~~LOC~~ SOLN
40.0000 [IU] | Freq: Two times a day (BID) | SUBCUTANEOUS | Status: DC
  Administered 2018-12-14 (×2): 40 [IU] via SUBCUTANEOUS
  Filled 2018-12-14 (×3): qty 0.4

## 2018-12-14 NOTE — Plan of Care (Signed)
  Problem: Clinical Measurements: Goal: Ability to maintain clinical measurements within normal limits will improve Outcome: Progressing   Problem: Clinical Measurements: Goal: Respiratory complications will improve Outcome: Progressing   Problem: Activity: Goal: Risk for activity intolerance will decrease Outcome: Progressing   

## 2018-12-14 NOTE — Progress Notes (Signed)
PROGRESS NOTE    Mary Raymond  MCN:[id number] DOB: [birth year] DOA: 12/08/2018 [name], FNP     Brief Narrative:  Mary Raymond is a 68 year old female with past medical history significant for CAD, diabetic peripheral neuropathy, uncontrolled diabetes with hyperglycemia, hyperlipidemia, hypertension, hypothyroidism, obesity, osteoarthritis, chronic back pain, fibromyalgia, vitamin D deficiency who presented with chest pressure and shortness of breath as well as lower extremity edema and orthopnea.  She was admitted for acute on chronic combined systolic and diastolic heart failure, given IV diuretic.  Needed by cardiology they determined that she has severe disease involving the distal left main and circumflex.  They recommended medical treatment.   New events last 24 hours / Subjective: Doing well. No new complaints today.  Denies any chest pain or pressure, no worsening shortness of breath.  Assessment & Plan:   Principal Problem:   Acute coronary syndrome (HCC) Active Problems:   Hypothyroidism   Hyperlipidemia associated with type 2 diabetes mellitus (HCC)   Type II diabetes mellitus (HCC)   Anxiety   GERD (gastroesophageal reflux disease)   Troponin level elevated   Anemia   Acute on chronic congestive heart failure (HCC)   Acute on chronic diastolic heart failure -EF 55-60%  -Appreciate cardiology, continue Lasix 80 mg daily, Cozaar  -Has diuresed -5.7L since admission (in and outs not accurately measured) -Weight 278lb --> 265lb   Coronary artery disease -Multivessel CAD s/p DES x 2 to the RCA with residual unrevascularized LM and LCx disease -Not felt to be a candidate for CABG due to very limited functional state -Appreciate cardiology -Continue aspirin, Plavix  Chronic kidney disease stage IV  -Baseline Cr appears to range from 1.25-1.99  -Stable today, 1.86   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 Rayann Heman  Hypothyroidism -Continue levothyroxine  Hyperlipidemia -Continuerosuvastatin  DM2 with hyperglycemia  -Lantus, SSI, Novolog. Continues to be hyperglycemic. Dose adjusted today  Anxiety -Continue alprazolam   GERD -Continue Protonix  Normocytic Anemia -Secondary to CKD - baseline Hgb ~9.0 as of Feb 2020  -Stable Hgb   Morbid obesity -Body mass index is 44.35 kg/m.   DVT prophylaxis: Subcutaneous heparin Code Status: Full code Family Communication: Spoke with daughter over the phone 3/28  Disposition Plan: Pending SNF insurance Auth.  Left message on peer-to-peer voice mail line 3/28. Called again today with no answer.     PT/OT evaluation performed.  SNF recommended.  SNF is appropriate as the patient has received 4 days of hospital care and is felt to need rehab services to restore this patient to the prior level of function to achieve safe transition back to home care given.  This patient needs rehab services for at least 5 days/week and skilled nursing services daily to facilitate this transition.  Rehab is being requested as the most appropriate discharge option for this patient and is not felt to be for custodial care as evidenced by independent of own ADL prior to admission. Short term rehab should be able to restore this patient to her prior level of function.    Consultants:   Cardiology  Procedures:   None  Antimicrobials:  Anti-infectives (From admission, onward)   Start     Dose/Rate Route Frequency Ordered Stop   12/09/18 1000  acyclovir (ZOVIRAX) tablet 400 mg     400 mg Oral Daily 12/09/18 0018         Objective: Vitals:   12/13/18 1921 12/13/18  2326 12/14/18 0228 12/14/18 0440  BP: 125/61 (!) 142/63 133/62 (!) 123/51  Pulse:  77 65 66  Resp: 18 20 17  (!) 22  Temp: 98.7 F (37.1 C) 97.6 F (36.4 C) 97.6 F (36.4 C) 98.2 F (36.8 C)  TempSrc: Oral Oral Oral Oral  SpO2: 92% 97% 98% 96%  Weight:    120.6 kg   Height:        Intake/Output Summary (Last 24 hours) at 12/14/2018 0910 Last data filed at 12/14/2018 0228 Gross per 24 hour  Intake 860 ml  Output 500 ml  Net 360 ml   Filed Weights   12/12/18 0434 12/13/18 0613 12/14/18 0440  Weight: 120.7 kg 120.9 kg 120.6 kg    Examination: General exam: Appears calm and comfortable  Respiratory system: Clear to auscultation. Respiratory effort normal. Cardiovascular system: S1 & S2 heard, RRR. No JVD, murmurs, rubs, gallops or clicks. No pedal edema. Gastrointestinal system: Abdomen is nondistended, soft and nontender. No organomegaly or masses felt. Normal bowel sounds heard. Central nervous system: Alert and oriented. No focal neurological deficits. Extremities: Symmetric 5 x 5 power. Skin: No rashes, lesions or ulcers Psychiatry: Judgement and insight appear normal. Mood & affect appropriate.    Data Reviewed: I have personally reviewed following labs and imaging studies  CBC: Recent Labs  Lab 12/08/18 1939 12/09/18 0220 12/09/18 0718 12/10/18 0322 12/12/18 0210 12/14/18 0211  WBC 14.1* 15.5*  --  13.9* 14.1* 13.3*  NEUTROABS 10.6*  --   --   --   --   --   HGB 8.7* 8.1* 8.4* 8.4* 8.7* 9.1*  HCT 29.5* 27.6* 28.5* 28.9* 28.7* 29.6*  MCV 82.9 81.4  --  82.6 82.0 81.3  PLT 262 243  --  265 243 762   Basic Metabolic Panel: Recent Labs  Lab 12/08/18 1939 12/10/18 0322 12/11/18 0749 12/12/18 0210 12/14/18 0211  NA 139 134* 135 134* 135  K 3.9 3.9 4.2 4.0 4.0  CL 105 95* 94* 92* 94*  CO2 22 30 27 28 29   GLUCOSE 214* 239* 424* 346* 295*  BUN 42* 40* 47* 51* 56*  CREATININE 1.88* 2.47* 2.52* 2.40* 1.86*  CALCIUM 9.2 9.0 8.9 9.1 9.3  MG 1.8  --   --   --   --    GFR: Estimated Creatinine Clearance: 38.2 mL/min (A) (by C-G formula based on SCr of 1.86 mg/dL (H)). Liver Function Tests: Recent Labs  Lab 12/10/18 0322  AST 14*  ALT 14  ALKPHOS 94  BILITOT 0.6  PROT 6.9  ALBUMIN 2.9*   No results for input(s):  LIPASE, AMYLASE in the last 168 hours. No results for input(s): AMMONIA in the last 168 hours. Coagulation Profile: No results for input(s): INR, PROTIME in the last 168 hours. Cardiac Enzymes: Recent Labs  Lab 12/08/18 1939 12/09/18 0220 12/09/18 0718  TROPONINI 0.34* 0.33* 0.35*   BNP (last 3 results) No results for input(s): PROBNP in the last 8760 hours. HbA1C: No results for input(s): HGBA1C in the last 72 hours. CBG: Recent Labs  Lab 12/13/18 0610 12/13/18 1101 12/13/18 1615 12/13/18 2126 12/14/18 0528  GLUCAP 250* 306* 348* 344* 327*   Lipid Profile: No results for input(s): CHOL, HDL, LDLCALC, TRIG, CHOLHDL, LDLDIRECT in the last 72 hours. Thyroid Function Tests: No results for input(s): TSH, T4TOTAL, FREET4, T3FREE, THYROIDAB in the last 72 hours. Anemia Panel: No results for input(s): VITAMINB12, FOLATE, FERRITIN, TIBC, IRON, RETICCTPCT in the last 72 hours. Sepsis Labs: No results  for input(s): PROCALCITON, LATICACIDVEN in the last 168 hours.  Recent Results (from the past 240 hour(s))  MRSA PCR Screening     Status: None   Collection Time: 12/08/18 11:58 PM  Result Value Ref Range Status   MRSA by PCR NEGATIVE NEGATIVE Final    Comment:        The GeneXpert MRSA Assay (FDA approved for NASAL specimens only), is one component of a comprehensive MRSA colonization surveillance program. It is not intended to diagnose MRSA infection nor to guide or monitor treatment for MRSA infections. Performed at [facility], [place] [address], [place], [place] [address]        Radiology Studies: No results found.    Scheduled Meds: . acyclovir  400 mg Oral Daily  . aspirin EC  81 mg Oral Daily  . clopidogrel  75 mg Oral Daily  . furosemide  80 mg Oral Daily  . gabapentin  100 mg Oral TID  . heparin injection (subcutaneous)  5,000 Units Subcutaneous Q8H  . insulin aspart  0-20 Units Subcutaneous TID WC  . insulin aspart  0-5 Units Subcutaneous QHS  .  insulin aspart  8 Units Subcutaneous TID WC  . insulin glargine  40 Units Subcutaneous BID  . levothyroxine  50 mcg Oral Q0600  . losartan  25 mg Oral Daily  . mouth rinse  15 mL Mouth Rinse BID  . montelukast  10 mg Oral QHS  . pantoprazole  40 mg Oral Daily  . polyethylene glycol  17 g Oral Daily  . rosuvastatin  40 mg Oral Daily   Continuous Infusions:   LOS: 6 days    Time spent: 25 minutes   Dessa Phi, DO [facility] [email or web address] 12/14/2018, 9:10 AM

## 2018-12-15 DIAGNOSIS — I5033 Acute on chronic diastolic (congestive) heart failure: Secondary | ICD-10-CM

## 2018-12-15 LAB — BASIC METABOLIC PANEL
Anion gap: 13 (ref 5–15)
BUN: 56 mg/dL — ABNORMAL HIGH (ref 8–23)
CALCIUM: 9.2 mg/dL (ref 8.9–10.3)
CO2: 29 mmol/L (ref 22–32)
CREATININE: 1.89 mg/dL — AB (ref 0.44–1.00)
Chloride: 91 mmol/L — ABNORMAL LOW (ref 98–111)
GFR calc Af Amer: 31 mL/min — ABNORMAL LOW (ref >60)
GFR calc non Af Amer: 27 mL/min — ABNORMAL LOW (ref >60)
Glucose, Bld: 348 mg/dL — ABNORMAL HIGH (ref 70–99)
Potassium: 4.1 mmol/L (ref 3.5–5.1)
Sodium: 133 mmol/L — ABNORMAL LOW (ref 135–145)

## 2018-12-15 LAB — CBC
HCT: 29.6 % — ABNORMAL LOW (ref 36.0–46.0)
Hemoglobin: 9 g/dL — ABNORMAL LOW (ref 12.0–15.0)
MCH: 24.7 pg — AB (ref 26.0–34.0)
MCHC: 30.4 g/dL (ref 30.0–36.0)
MCV: 81.3 fL (ref 80.0–100.0)
Platelets: 254 10*3/uL (ref 150–400)
RBC: 3.64 MIL/uL — ABNORMAL LOW (ref 3.87–5.11)
RDW: 16 % — AB (ref 11.5–15.5)
WBC: 14.3 10*3/uL — ABNORMAL HIGH (ref 4.0–10.5)
nRBC: 0 % (ref 0.0–0.2)

## 2018-12-15 LAB — GLUCOSE, CAPILLARY
Glucose-Capillary: 313 mg/dL — ABNORMAL HIGH (ref 70–99)
Glucose-Capillary: 392 mg/dL — ABNORMAL HIGH (ref 70–99)
Glucose-Capillary: 311 mg/dL — ABNORMAL HIGH (ref 70–99)
Glucose-Capillary: 362 mg/dL — ABNORMAL HIGH (ref 70–99)
Glucose-Capillary: 309 mg/dL — ABNORMAL HIGH (ref 70–99)

## 2018-12-15 MED ORDER — INSULIN ASPART 100 UNIT/ML ~~LOC~~ SOLN
0.0000 [IU] | SUBCUTANEOUS | Status: DC
  Administered 2018-12-15 (×2): 15 [IU] via SUBCUTANEOUS
  Administered 2018-12-15: 20 [IU] via SUBCUTANEOUS
  Administered 2018-12-16: 7 [IU] via SUBCUTANEOUS
  Administered 2018-12-16: 15 [IU] via SUBCUTANEOUS
  Administered 2018-12-16: 7 [IU] via SUBCUTANEOUS
  Administered 2018-12-16 (×2): 15 [IU] via SUBCUTANEOUS
  Administered 2018-12-17: 12:00:00 7 [IU] via SUBCUTANEOUS
  Administered 2018-12-17 (×2): 3 [IU] via SUBCUTANEOUS

## 2018-12-15 MED ORDER — INSULIN ASPART 100 UNIT/ML ~~LOC~~ SOLN
10.0000 [IU] | Freq: Three times a day (TID) | SUBCUTANEOUS | Status: DC
  Administered 2018-12-15 – 2018-12-16 (×3): 10 [IU] via SUBCUTANEOUS

## 2018-12-15 MED ORDER — INSULIN GLARGINE 100 UNIT/ML ~~LOC~~ SOLN
45.0000 [IU] | Freq: Two times a day (BID) | SUBCUTANEOUS | Status: DC
  Administered 2018-12-15 – 2018-12-16 (×3): 45 [IU] via SUBCUTANEOUS
  Filled 2018-12-15 (×3): qty 0.45

## 2018-12-15 NOTE — Progress Notes (Signed)
PROGRESS NOTE    [name]  [name] DOB: July 19, 1951 DOA: 12/08/2018 [name], FNP     Brief Narrative:  Mary Raymond is a 68 year old female with past medical history significant for CAD, diabetic peripheral neuropathy, uncontrolled diabetes with hyperglycemia, hyperlipidemia, hypertension, hypothyroidism, obesity, osteoarthritis, chronic back pain, fibromyalgia, vitamin D deficiency who presented with chest pressure and shortness of breath as well as lower extremity edema and orthopnea.  She was admitted for acute on chronic combined systolic and diastolic heart failure, given IV diuretic.  Seen by cardiology they determined that she has severe disease involving the distal left main and circumflex.  They recommended medical treatment.   New events last 24 hours / Subjective: Complains of fatigue due to constant interruptions overnight.  No other physical complaints today.  Assessment & Plan:   Principal Problem:   Acute on chronic congestive heart failure (HCC) Active Problems:   Hypothyroidism   Hyperlipidemia associated with type 2 diabetes mellitus (HCC)   Type II diabetes mellitus (HCC)   Anxiety   GERD (gastroesophageal reflux disease)   Troponin level elevated   Anemia   Acute on chronic diastolic heart failure -EF 55-60%  -Appreciate cardiology, continue Lasix 80 mg daily, Cozaar  -Has diuresed -6.3L since admission (in and outs not accurately measured) -Weight 278lb --> 268lb   Coronary artery disease -Multivessel CAD s/p DES x 2 to the RCA with residual unrevascularized LM and LCx disease -Not felt to be a candidate for CABG due to very limited functional state -Appreciate cardiology -Continue aspirin, Plavix  Chronic kidney disease stage IV  -Baseline Cr appears to range from 1.25-1.99  -Stable today, 1.89  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 Rayann Heman  Hypothyroidism  -Continue levothyroxine  Hyperlipidemia -Continuerosuvastatin  DM2 with hyperglycemia  -Lantus, SSI, Novolog. Continues to be hyperglycemic. Dose further adjusted today  Anxiety -Continue alprazolam   GERD -Continue Protonix  Normocytic Anemia -Secondary to CKD - baseline Hgb ~9.0 as of Feb 2020  -Stable Hgb   Morbid obesity -Body mass index is 44.35 kg/m.   DVT prophylaxis: Subcutaneous heparin Code Status: Full code Family Communication: Spoke with daughter over the phone 3/28  Disposition Plan: Pending SNF insurance Auth.  Called insurance peer to peer line this morning. Awaiting callback    PT/OT evaluation performed.  SNF recommended.  SNF is appropriate as the patient has received 4 days of hospital care and is felt to need rehab services to restore this patient to the prior level of function to achieve safe transition back to home care given.  This patient needs rehab services for at least 5 days/week and skilled nursing services daily to facilitate this transition.  Rehab is being requested as the most appropriate discharge option for this patient and is not felt to be for custodial care as evidenced by independent of own ADL prior to admission. Short term rehab should be able to restore this patient to her prior level of function.    Consultants:   Cardiology  Procedures:   None  Antimicrobials:  Anti-infectives (From admission, onward)   Start     Dose/Rate Route Frequency Ordered Stop   12/09/18 1000  acyclovir (ZOVIRAX) tablet 400 mg     400 mg Oral Daily 12/09/18 0018         Objective: Vitals:   12/14/18 2040 12/14/18 2044 12/15/18 0300 12/15/18 0717  BP:  123/62 (!) 112/52 (!) 110/55  Pulse:  72  67 70  Resp: 19  (!) 26 18  Temp: 98.3 F (36.8 C)  97.9 F (36.6 C) (!) 97.5 F (36.4 C)  TempSrc: Oral  Axillary Oral  SpO2: 99%  99% 97%  Weight:   121.6 kg   Height:        Intake/Output Summary (Last 24 hours) at 12/15/2018 0912  Last data filed at 12/15/2018 0719 Gross per 24 hour  Intake 540 ml  Output 1400 ml  Net -860 ml   Filed Weights   12/13/18 0613 12/14/18 0440 12/15/18 0300  Weight: 120.9 kg 120.6 kg 121.6 kg    Examination: General exam: Appears calm and comfortable  Respiratory system: Clear to auscultation. Respiratory effort normal. Cardiovascular system: S1 & S2 heard, RRR. No JVD, murmurs, rubs, gallops or clicks. No pedal edema. Gastrointestinal system: Abdomen is nondistended, soft and nontender. No organomegaly or masses felt. Normal bowel sounds heard. Central nervous system: Alert and oriented. No focal neurological deficits. Extremities: Symmetric 5 x 5 power. Skin: No rashes, lesions or ulcers Psychiatry: Judgement and insight appear normal. Mood & affect appropriate.    Data Reviewed: I have personally reviewed following labs and imaging studies  CBC: Recent Labs  Lab 12/08/18 1939 12/09/18 0220 12/09/18 0718 12/10/18 0322 12/12/18 0210 12/14/18 0211 12/15/18 0224  WBC 14.1* 15.5*  --  13.9* 14.1* 13.3* 14.3*  NEUTROABS 10.6*  --   --   --   --   --   --   HGB 8.7* 8.1* 8.4* 8.4* 8.7* 9.1* 9.0*  HCT 29.5* 27.6* 28.5* 28.9* 28.7* 29.6* 29.6*  MCV 82.9 81.4  --  82.6 82.0 81.3 81.3  PLT 262 243  --  265 243 256 256   Basic Metabolic Panel: Recent Labs  Lab 12/08/18 1939 12/10/18 0322 12/11/18 0749 12/12/18 0210 12/14/18 0211 12/15/18 0224  NA 139 134* 135 134* 135 133*  K 3.9 3.9 4.2 4.0 4.0 4.1  CL 105 95* 94* 92* 94* 91*  CO2 22 30 27 28 29 29   GLUCOSE 214* 239* 424* 346* 295* 348*  BUN 42* 40* 47* 51* 56* 56*  CREATININE 1.88* 2.47* 2.52* 2.40* 1.86* 1.89*  CALCIUM 9.2 9.0 8.9 9.1 9.3 9.2  MG 1.8  --   --   --   --   --    GFR: Estimated Creatinine Clearance: 37.8 mL/min (A) (by C-G formula based on SCr of 1.89 mg/dL (H)). Liver Function Tests: Recent Labs  Lab 12/10/18 0322  AST 14*  ALT 14  ALKPHOS 94  BILITOT 0.6  PROT 6.9  ALBUMIN 2.9*   No  results for input(s): LIPASE, AMYLASE in the last 168 hours. No results for input(s): AMMONIA in the last 168 hours. Coagulation Profile: No results for input(s): INR, PROTIME in the last 168 hours. Cardiac Enzymes: Recent Labs  Lab 12/08/18 1939 12/09/18 0220 12/09/18 0718  TROPONINI 0.34* 0.33* 0.35*   BNP (last 3 results) No results for input(s): PROBNP in the last 8760 hours. HbA1C: No results for input(s): HGBA1C in the last 72 hours. CBG: Recent Labs  Lab 12/14/18 0528 12/14/18 1106 12/14/18 1609 12/14/18 2118 12/15/18 0609  GLUCAP 327* 360* 398* 380* 313*   Lipid Profile: No results for input(s): CHOL, HDL, LDLCALC, TRIG, CHOLHDL, LDLDIRECT in the last 72 hours. Thyroid Function Tests: No results for input(s): TSH, T4TOTAL, FREET4, T3FREE, THYROIDAB in the last 72 hours. Anemia Panel: No results for input(s): VITAMINB12, FOLATE, FERRITIN, TIBC, IRON, RETICCTPCT in the last  72 hours. Sepsis Labs: No results for input(s): PROCALCITON, LATICACIDVEN in the last 168 hours.  Recent Results (from the past 240 hour(s))  MRSA PCR Screening     Status: None   Collection Time: 12/08/18 11:58 PM  Result Value Ref Range Status   MRSA by PCR NEGATIVE NEGATIVE Final    Comment:        The GeneXpert MRSA Assay (FDA approved for NASAL specimens only), is one component of a comprehensive MRSA colonization surveillance program. It is not intended to diagnose MRSA infection nor to guide or monitor treatment for MRSA infections. Performed at [facility], [place] [address], [place], [place] [address]        Radiology Studies: No results found.    Scheduled Meds: . acyclovir  400 mg Oral Daily  . aspirin EC  81 mg Oral Daily  . clopidogrel  75 mg Oral Daily  . furosemide  80 mg Oral Daily  . gabapentin  100 mg Oral TID  . heparin injection (subcutaneous)  5,000 Units Subcutaneous Q8H  . insulin aspart  0-20 Units Subcutaneous TID WC  . insulin aspart  0-5  Units Subcutaneous QHS  . insulin aspart  10 Units Subcutaneous TID WC  . insulin glargine  45 Units Subcutaneous BID  . levothyroxine  50 mcg Oral Q0600  . losartan  25 mg Oral Daily  . mouth rinse  15 mL Mouth Rinse BID  . montelukast  10 mg Oral QHS  . pantoprazole  40 mg Oral Daily  . polyethylene glycol  17 g Oral Daily  . rosuvastatin  40 mg Oral Daily   Continuous Infusions:   LOS: 7 days    Time spent: 25 minutes   Dessa Phi, DO [facility] [email or web address] 12/15/2018, 9:12 AM

## 2018-12-15 NOTE — Plan of Care (Signed)

## 2018-12-15 NOTE — TOC Progression Note (Signed)
Transition of Care [facility]) - Progression Note    Patient Details  Name: Mary Raymond MRN: [id number] Date of Birth: 05/17/1951  Transition of Care [facility]) CM/SW [place], [place] Phone Number: 12/15/2018, 2:16 PM  Clinical Narrative:    CSW called to follow up on peer to peer request; they have Dessa Phi information and their director will contact her for peer to peer.    Expected Discharge Plan: Skilled Nursing Facility Barriers to Discharge: Insurance Authorization  Expected Discharge Plan and Services Expected Discharge Plan: [place]       Living arrangements for the past 2 months: Single Family Home                           Social Determinants of Health (SDOH) Interventions    Readmission Risk Interventions Readmission Risk Prevention Plan 12/12/2018 12/10/2018  Transportation Screening - Complete  Medication Review Press photographer) - Complete  PCP or Specialist appointment within 3-5 days of discharge Not Complete Complete  HRI or [place] - Complete  SW Recovery Care/Counseling Consult - Complete  [place] - Patient Refused  Some recent data might be hidden

## 2018-12-15 NOTE — Care Management Important Message (Signed)
Important Message  Patient Details  Name: Mary Raymond MRN: [id number] Date of Birth: 22-May-1951   Medicare Important Message Given:  Yes    Ashlynd Michna 12/15/2018, 1:19 PM

## 2018-12-15 NOTE — Progress Notes (Signed)
Physical Therapy Treatment Patient Details Name: Mary Raymond MRN: [id number] DOB: [birth year] Today's Date: 12/15/2018    History of Present Illness Pt is a 68 y/o female admitted secondary to worsening SOB thought to be secondary to CHF. Pt also with severe CAD, however, is not a surgical candidate and per cardiology notes, will be managed medically. PMH includes DM, anxiety, CAD, asthma, CVA, MI, pacemaker, CKD.     PT Comments    Pt continues to require assist for basic mobility at this time. Prior to admission pt was modified independent with mobility at home. Pt fatigues quickly and requires frequent rest breaks. Feel pt could benefit from SNF to return to prior level of function.    Follow Up Recommendations  SNF     Equipment Recommendations  Other (comment)(TBD at next venue)    Recommendations for Other Services       Precautions / Restrictions Precautions Precautions: Fall Restrictions Weight Bearing Restrictions: No    Mobility  Bed Mobility               General bed mobility comments: Up in chair  Transfers Overall transfer level: Needs assistance Equipment used: Rolling walker (2 wheeled) Transfers: Sit to/from Stand;Stand Pivot Transfers Sit to Stand: Min assist         General transfer comment: Assist to bring hips up and for balance. Verbal cues for hand placement.   Ambulation/Gait Ambulation/Gait assistance: Min assist Gait Distance (Feet): 75 Feet(75' x 1, 15' x 1) Assistive device: 4-wheeled walker Gait Pattern/deviations: Step-through pattern;Decreased stride length;Trunk flexed Gait velocity: decr Gait velocity interpretation: <1.31 ft/sec, indicative of household ambulator General Gait Details: Assist for balance and safety. Verbal cues to stand more erect. Pt fatigues quickly and requires sitting rest breaks   Stairs             Wheelchair Mobility    Modified Rankin (Stroke Patients Only)       Balance Overall  balance assessment: Needs assistance Sitting-balance support: No upper extremity supported;Feet supported Sitting balance-Leahy Scale: Good     Standing balance support: Bilateral upper extremity supported;During functional activity Standing balance-Leahy Scale: Poor Standing balance comment: walker and min assist for static standing                            Cognition Arousal/Alertness: Awake/alert Behavior During Therapy: WFL for tasks assessed/performed Overall Cognitive Status: Within Functional Limits for tasks assessed                                        Exercises      General Comments        Pertinent Vitals/Pain Pain Assessment: 0-10 Pain Score: 1  Pain Location: sacrum Pain Descriptors / Indicators: Sore;Dull Pain Intervention(s): Monitored during session;Repositioned    Home Living                      Prior Function            PT Goals (current goals can now be found in the care plan section) Progress towards PT goals: Progressing toward goals    Frequency    Min 3X/week      PT Plan Current plan remains appropriate    Co-evaluation              AM-PAC PT "6  Clicks" Mobility   Outcome Measure  Help needed turning from your back to your side while in a flat bed without using bedrails?: A Little Help needed moving from lying on your back to sitting on the side of a flat bed without using bedrails?: A Little Help needed moving to and from a bed to a chair (including a wheelchair)?: A Little Help needed standing up from a chair using your arms (e.g., wheelchair or bedside chair)?: A Little Help needed to walk in hospital room?: A Little Help needed climbing 3-5 steps with a railing? : Total 6 Click Score: 16    End of Session Equipment Utilized During Treatment: Gait belt;Oxygen Activity Tolerance: Patient tolerated treatment well Patient left: in chair;with call bell/phone within reach Nurse  Communication: Mobility status PT Visit Diagnosis: Other abnormalities of gait and mobility (R26.89);Unsteadiness on feet (R26.81);Muscle weakness (generalized) (M62.81)     Time: 0355-9741 PT Time Calculation (min) (ACUTE ONLY): 25 min  Charges:  $Gait Training: 23-37 mins                     [place] Pager [phone] Office [place] 12/15/2018, 4:34 PM

## 2018-12-15 NOTE — Progress Notes (Signed)
Inpatient Diabetes Program Recommendations  AACE/ADA: New Consensus Statement on Inpatient Glycemic Control (2015)  Target Ranges:  Prepandial:   less than 140 mg/dL      Peak postprandial:   less than 180 mg/dL (1-2 hours)      Critically ill patients:  140 - 180 mg/dL   Lab Results  Component Value Date   GLUCAP 392 (H) 12/15/2018   HGBA1C 8.8 (H) 10/27/2018    Review of Glycemic Control Results for Mary Raymond, Mary Raymond (MRN [id number]) as of 12/15/2018 12:27  Ref. Range 12/14/2018 16:09 12/14/2018 21:18 12/15/2018 06:09 12/15/2018 11:05  Glucose-Capillary Latest Ref Range: 70 - 99 mg/dL 398 (H) 380 (H) 313 (H) 392 (H)   Diabetes history: DM 2 Outpatient Diabetes medications: Lantus 28 units bid, Metformin 1000 mg bid, Ozempic- hasn't started ye Current orders for Inpatient glycemic control:  Lantus 45 units bid, Novolog 10 units tid, Novolog resistant tid with meals and HS  Inpatient Diabetes Program Recommendations:   Note increased insulin doses today.  Unsure why blood sugars are so difficult to control in the hospital?   -May consider increasing Novolog correction to q 4 hours, while insulins are being titrated.  Will follow.   Thanks,  Adah Perl, RN, BC-ADM Inpatient Diabetes Coordinator Pager [phone] (8a-5p)

## 2018-12-15 NOTE — Progress Notes (Signed)
Occupational Therapy Treatment Patient Details Name: Mary Raymond MRN: [id number] DOB: 1950/12/08 Today's Date: 12/15/2018    History of present illness Pt is a 68 y/o female admitted secondary to worsening SOB thought to be secondary to CHF. Pt also with severe CAD, however, is not a surgical candidate and per cardiology notes, will be managed medically. PMH includes DM, anxiety, CAD, asthma, CVA, MI, pacemaker, CKD.    OT comments  Pt progressing towards OT goals this session. Supine in bed, able to perform bed mobility at supervision level. Able to stand from EOB min guard and perform toilet hygeine as purewick had leaked for the front and back. During ambulation (min guard) to sink for grooming, Pt experienced dizziness and required seated rest break. BP at that time 138/64, after rest break and with standing Pt BP was 121/94 and no dizziness reported - however does complain of decreased activity tolerance and fatigue - SNF continues to be apppropriate DC plan. Next session focus on tub transfer.   Follow Up Recommendations  SNF;Supervision/Assistance - 24 hour    Equipment Recommendations  None recommended by OT    Recommendations for Other Services PT consult    Precautions / Restrictions Precautions Precautions: Fall Restrictions Weight Bearing Restrictions: No       Mobility Bed Mobility Overal bed mobility: Needs Assistance Bed Mobility: Supine to Sit Rolling: Supervision         General bed mobility comments: use of grab bars  Transfers Overall transfer level: Needs assistance Equipment used: Rolling walker (2 wheeled) Transfers: Sit to/from [facility] Sit to Stand: Min guard         General transfer comment: Min Guard for safety    Balance Overall balance assessment: Needs assistance Sitting-balance support: No upper extremity supported;Feet supported Sitting balance-Leahy Scale: Good     Standing balance support: Bilateral  upper extremity supported;During functional activity Standing balance-Leahy Scale: Poor Standing balance comment: Reliant on BUE support.                            ADL either performed or assessed with clinical judgement   ADL Overall ADL's : Needs assistance/impaired     Grooming: Supervision/safety;Set up;Wash/dry hands;Sitting Grooming Details (indicate cue type and reason): felt dizzy, unable to maintain standing for sink level grooming         Upper Body Dressing : Supervision/safety;Set up;Sitting Upper Body Dressing Details (indicate cue type and reason): to don gown as robe     Toilet Transfer: Min guard;Ambulation;RW;Regular Glass blower/designer Details (indicate cue type and reason): Min Guard A for safety Toileting- Clothing Manipulation and Hygiene: Min guard;Sit to/from stand Toileting - Clothing Manipulation Details (indicate cue type and reason): able to perform front and rear peri care in standing with RW for balance and min guard for safety     Functional mobility during ADLs: Min guard;Rolling walker General ADL Comments: with initial ambulation, Pt with dizziness, requiring sitting rest break, BP taken and WFL - 138/64 and 121/94 in standing after seated rest break     Vision       Perception     Praxis      Cognition Arousal/Alertness: Awake/alert Behavior During Therapy: WFL for tasks assessed/performed Overall Cognitive Status: Within Functional Limits for tasks assessed  Exercises     Shoulder Instructions       General Comments      Pertinent Vitals/ Pain       Pain Assessment: 0-10 Pain Score: 1  Pain Location: sacrum Pain Descriptors / Indicators: Sore;Dull Pain Intervention(s): Monitored during session;Repositioned  Home Living                                          Prior Functioning/Environment              Frequency  Min 2X/week         Progress Toward Goals  OT Goals(current goals can now be found in the care plan section)  Progress towards OT goals: Progressing toward goals  Acute Rehab OT Goals Patient Stated Goal: to go home OT Goal Formulation: With patient Time For Goal Achievement: 12/27/18 Potential to Achieve Goals: Good  Plan Discharge plan remains appropriate;Frequency remains appropriate    Co-evaluation                 AM-PAC OT "6 Clicks" Daily Activity     Outcome Measure   Help from another person eating meals?: None Help from another person taking care of personal grooming?: A Little Help from another person toileting, which includes using toliet, bedpan, or urinal?: A Little Help from another person bathing (including washing, rinsing, drying)?: A Little Help from another person to put on and taking off regular upper body clothing?: None Help from another person to put on and taking off regular lower body clothing?: A Little 6 Click Score: 20    End of Session Equipment Utilized During Treatment: Oxygen;Rolling walker(2L)  OT Visit Diagnosis: Unsteadiness on feet (R26.81);Other abnormalities of gait and mobility (R26.89);Muscle weakness (generalized) (M62.81);History of falling (Z91.81)   Activity Tolerance Patient tolerated treatment well   Patient Left in chair(with PT about to ambulate)   Nurse Communication Mobility status        Time: 1101-1120 OT Time Calculation (min): 19 min  Charges: OT General Charges $OT Visit: 1 Visit OT Treatments $Self Care/Home Management : 8-22 mins  Hulda Humphrey OTR/L Acute Rehabilitation Services Pager: [phone] Office: [place] 12/15/2018, 11:36 AM

## 2018-12-16 ENCOUNTER — Ambulatory Visit: Payer: Self-pay | Admitting: Licensed Clinical Social Worker

## 2018-12-16 ENCOUNTER — Telehealth: Payer: Self-pay | Admitting: *Deleted

## 2018-12-16 DIAGNOSIS — R531 Weakness: Secondary | ICD-10-CM

## 2018-12-16 DIAGNOSIS — K219 Gastro-esophageal reflux disease without esophagitis: Secondary | ICD-10-CM

## 2018-12-16 DIAGNOSIS — Z794 Long term (current) use of insulin: Secondary | ICD-10-CM

## 2018-12-16 DIAGNOSIS — E1159 Type 2 diabetes mellitus with other circulatory complications: Secondary | ICD-10-CM

## 2018-12-16 DIAGNOSIS — F419 Anxiety disorder, unspecified: Secondary | ICD-10-CM

## 2018-12-16 DIAGNOSIS — I509 Heart failure, unspecified: Secondary | ICD-10-CM

## 2018-12-16 LAB — CBC
HCT: 29.9 % — ABNORMAL LOW (ref 36.0–46.0)
HEMOGLOBIN: 8.8 g/dL — AB (ref 12.0–15.0)
MCH: 24 pg — ABNORMAL LOW (ref 26.0–34.0)
MCHC: 29.4 g/dL — ABNORMAL LOW (ref 30.0–36.0)
MCV: 81.7 fL (ref 80.0–100.0)
NRBC: 0 % (ref 0.0–0.2)
Platelets: 245 10*3/uL (ref 150–400)
RBC: 3.66 MIL/uL — ABNORMAL LOW (ref 3.87–5.11)
RDW: 16 % — ABNORMAL HIGH (ref 11.5–15.5)
WBC: 14.4 10*3/uL — ABNORMAL HIGH (ref 4.0–10.5)

## 2018-12-16 LAB — BASIC METABOLIC PANEL
Anion gap: 9 (ref 5–15)
BUN: 55 mg/dL — ABNORMAL HIGH (ref 8–23)
CO2: 34 mmol/L — ABNORMAL HIGH (ref 22–32)
Calcium: 9.6 mg/dL (ref 8.9–10.3)
Chloride: 93 mmol/L — ABNORMAL LOW (ref 98–111)
Creatinine, Ser: 1.79 mg/dL — ABNORMAL HIGH (ref 0.44–1.00)
GFR calc Af Amer: 33 mL/min — ABNORMAL LOW (ref >60)
GFR calc non Af Amer: 29 mL/min — ABNORMAL LOW (ref >60)
Glucose, Bld: 247 mg/dL — ABNORMAL HIGH (ref 70–99)
Potassium: 3.7 mmol/L (ref 3.5–5.1)
Sodium: 136 mmol/L (ref 135–145)

## 2018-12-16 LAB — GLUCOSE, CAPILLARY
Glucose-Capillary: 228 mg/dL — ABNORMAL HIGH (ref 70–99)
Glucose-Capillary: 397 mg/dL — ABNORMAL HIGH (ref 70–99)
Glucose-Capillary: 346 mg/dL — ABNORMAL HIGH (ref 70–99)
Glucose-Capillary: 314 mg/dL — ABNORMAL HIGH (ref 70–99)
Glucose-Capillary: 224 mg/dL — ABNORMAL HIGH (ref 70–99)

## 2018-12-16 MED ORDER — INSULIN ASPART 100 UNIT/ML ~~LOC~~ SOLN
15.0000 [IU] | Freq: Three times a day (TID) | SUBCUTANEOUS | Status: DC
  Administered 2018-12-16 – 2018-12-17 (×4): 15 [IU] via SUBCUTANEOUS

## 2018-12-16 MED ORDER — INSULIN GLARGINE 100 UNIT/ML ~~LOC~~ SOLN
50.0000 [IU] | Freq: Two times a day (BID) | SUBCUTANEOUS | Status: DC
  Administered 2018-12-16 – 2018-12-17 (×2): 50 [IU] via SUBCUTANEOUS
  Filled 2018-12-16 (×4): qty 0.5

## 2018-12-16 NOTE — TOC Progression Note (Signed)
Transition of Care [facility]) - Progression Note    Patient Details  Name: Mary Raymond MRN: [id number] Date of Birth: [birth year]  Transition of Care [facility]) CM/SW [place], [place] Phone Number: 12/16/2018, 10:06 AM  Clinical Narrative:    CSW spoke with Mardene Celeste, Admissions Coordinator for [facility]- they have PT and MD notes and they are sending to patient's insurance for review for authorization.    Expected Discharge Plan: Skilled Nursing Facility Barriers to Discharge: Insurance Authorization  Expected Discharge Plan and Services Expected Discharge Plan: [place]       Living arrangements for the past 2 months: Single Family Home                           Social Determinants of Health (SDOH) Interventions    Readmission Risk Interventions Readmission Risk Prevention Plan 12/12/2018 12/10/2018  Transportation Screening - Complete  Medication Review Press photographer) - Complete  PCP or Specialist appointment within 3-5 days of discharge Not Complete Complete  HRI or [place] - Complete  SW Recovery Care/Counseling Consult - Complete  [place] - Patient Refused  Some recent data might be hidden

## 2018-12-16 NOTE — Progress Notes (Signed)
PROGRESS NOTE    Mary Raymond  GUY:[id number] DOB: 02/26/1951 DOA: 12/08/2018 [name], FNP     Brief Narrative:  Mary Raymond is a 68 year old female with past medical history significant for CAD, diabetic peripheral neuropathy, uncontrolled diabetes with hyperglycemia, hyperlipidemia, hypertension, hypothyroidism, obesity, osteoarthritis, chronic back pain, fibromyalgia, vitamin D deficiency who presented with chest pressure and shortness of breath as well as lower extremity edema and orthopnea.  She was admitted for acute on chronic combined systolic and diastolic heart failure, given IV diuretic.  Seen by cardiology they determined that she has severe disease involving the distal left main and circumflex.  They recommended medical treatment.   New events last 24 hours / Subjective: No new complaints  Assessment & Plan:   Principal Problem:   Acute on chronic diastolic CHF (congestive heart failure) (HCC) Active Problems:   Hypothyroidism   Hyperlipidemia associated with type 2 diabetes mellitus (HCC)   Type II diabetes mellitus (HCC)   Anxiety   GERD (gastroesophageal reflux disease)   Troponin level elevated   Anemia   Acute on chronic diastolic heart failure -EF 55-60%  -Appreciate cardiology, continue Lasix 80 mg daily, Cozaar  -Has diuresed -6.5L since admission (in and outs not accurately measured) -Weight 278lb --> 260lb   Coronary artery disease -Multivessel CAD s/p DES x 2 to the RCA with residual unrevascularized LM and LCx disease -Not felt to be a candidate for CABG due to very limited functional state -Appreciate cardiology -Continue aspirin, Plavix  Chronic kidney disease stage IV  -Baseline Cr appears to range from 1.25-1.99  -Stable today, 1.79  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 Rayann Heman  Hypothyroidism -Continue levothyroxine  Hyperlipidemia -Continuerosuvastatin   DM2 with hyperglycemia  -Lantus, SSI, Novolog. Continues to be hyperglycemic. Dose further adjusted today  Anxiety -Continue alprazolam   GERD -Continue Protonix  Normocytic Anemia -Secondary to CKD - baseline Hgb ~9.0 as of Feb 2020  -Stable Hgb 8.8  Morbid obesity -Body mass index is 44.35 kg/m.   DVT prophylaxis: Subcutaneous heparin Code Status: Full code Family Communication: Spoke with daughter over the phone 3/28  Disposition Plan: Pending SNF insurance Auth.  Called insurance peer to peer line 3/30    PT/OT evaluation performed.  SNF recommended.  SNF is appropriate as the patient has received 4 days of hospital care and is felt to need rehab services to restore this patient to the prior level of function to achieve safe transition back to home care given.  This patient needs rehab services for at least 5 days/week and skilled nursing services daily to facilitate this transition.  Rehab is being requested as the most appropriate discharge option for this patient and is not felt to be for custodial care as evidenced by independent of own ADL prior to admission. Short term rehab should be able to restore this patient to her prior level of function.    Consultants:   Cardiology  Procedures:   None  Antimicrobials:  Anti-infectives (From admission, onward)   Start     Dose/Rate Route Frequency Ordered Stop   12/09/18 1000  acyclovir (ZOVIRAX) tablet 400 mg     400 mg Oral Daily 12/09/18 0018         Objective: Vitals:   12/16/18 0338 12/16/18 0500 12/16/18 0723 12/16/18 1129  BP: (!) 144/60  (!) 157/63 (!) 133/55  Pulse: 68  75 100  Resp: (!) 23  17 16  Temp: 97.9 F (36.6 C)  97.8 F (36.6 C) (!) 97.5 F (36.4 C)  TempSrc: Oral  Oral Oral  SpO2: 98%  94% 96%  Weight:  118.1 kg    Height:        Intake/Output Summary (Last 24 hours) at 12/16/2018 1144 Last data filed at 12/16/2018 0724 Gross per 24 hour  Intake 560 ml  Output 750 ml  Net -190  ml   Filed Weights   12/14/18 0440 12/15/18 0300 12/16/18 0500  Weight: 120.6 kg 121.6 kg 118.1 kg    Examination: General exam: Appears calm and comfortable  Respiratory system: Clear to auscultation. Respiratory effort normal. Cardiovascular system: S1 & S2 heard, RRR. No JVD, murmurs, rubs, gallops or clicks. No pedal edema. Gastrointestinal system: Abdomen is nondistended, soft and nontender. No organomegaly or masses felt. Normal bowel sounds heard. Central nervous system: Alert and oriented. No focal neurological deficits. Extremities: Symmetric 5 x 5 power. Skin: No rashes, lesions or ulcers Psychiatry: Judgement and insight appear normal. Mood & affect appropriate.   Data Reviewed: I have personally reviewed following labs and imaging studies  CBC: Recent Labs  Lab 12/10/18 0322 12/12/18 0210 12/14/18 0211 12/15/18 0224 12/16/18 0226  WBC 13.9* 14.1* 13.3* 14.3* 14.4*  HGB 8.4* 8.7* 9.1* 9.0* 8.8*  HCT 28.9* 28.7* 29.6* 29.6* 29.9*  MCV 82.6 82.0 81.3 81.3 81.7  PLT 265 243 256 254 259   Basic Metabolic Panel: Recent Labs  Lab 12/11/18 0749 12/12/18 0210 12/14/18 0211 12/15/18 0224 12/16/18 0226  NA 135 134* 135 133* 136  K 4.2 4.0 4.0 4.1 3.7  CL 94* 92* 94* 91* 93*  CO2 27 28 29 29  34*  GLUCOSE 424* 346* 295* 348* 247*  BUN 47* 51* 56* 56* 55*  CREATININE 2.52* 2.40* 1.86* 1.89* 1.79*  CALCIUM 8.9 9.1 9.3 9.2 9.6   GFR: Estimated Creatinine Clearance: 39.2 mL/min (A) (by C-G formula based on SCr of 1.79 mg/dL (H)). Liver Function Tests: Recent Labs  Lab 12/10/18 0322  AST 14*  ALT 14  ALKPHOS 94  BILITOT 0.6  PROT 6.9  ALBUMIN 2.9*   No results for input(s): LIPASE, AMYLASE in the last 168 hours. No results for input(s): AMMONIA in the last 168 hours. Coagulation Profile: No results for input(s): INR, PROTIME in the last 168 hours. Cardiac Enzymes: No results for input(s): CKTOTAL, CKMB, CKMBINDEX, TROPONINI in the last 168 hours. BNP  (last 3 results) No results for input(s): PROBNP in the last 8760 hours. HbA1C: No results for input(s): HGBA1C in the last 72 hours. CBG: Recent Labs  Lab 12/15/18 1615 12/15/18 2016 12/15/18 2305 12/16/18 0337 12/16/18 1103  GLUCAP 311* 362* 309* 228* 397*   Lipid Profile: No results for input(s): CHOL, HDL, LDLCALC, TRIG, CHOLHDL, LDLDIRECT in the last 72 hours. Thyroid Function Tests: No results for input(s): TSH, T4TOTAL, FREET4, T3FREE, THYROIDAB in the last 72 hours. Anemia Panel: No results for input(s): VITAMINB12, FOLATE, FERRITIN, TIBC, IRON, RETICCTPCT in the last 72 hours. Sepsis Labs: No results for input(s): PROCALCITON, LATICACIDVEN in the last 168 hours.  Recent Results (from the past 240 hour(s))  MRSA PCR Screening     Status: None   Collection Time: 12/08/18 11:58 PM  Result Value Ref Range Status   MRSA by PCR NEGATIVE NEGATIVE Final    Comment:        The GeneXpert MRSA Assay (FDA approved for NASAL specimens only), is one component of a comprehensive MRSA colonization surveillance program. It  is not intended to diagnose MRSA infection nor to guide or monitor treatment for MRSA infections. Performed at [facility], [place] [address], [place], [place] [address]        Radiology Studies: No results found.    Scheduled Meds: . acyclovir  400 mg Oral Daily  . aspirin EC  81 mg Oral Daily  . clopidogrel  75 mg Oral Daily  . furosemide  80 mg Oral Daily  . gabapentin  100 mg Oral TID  . heparin injection (subcutaneous)  5,000 Units Subcutaneous Q8H  . insulin aspart  0-20 Units Subcutaneous Q4H  . insulin aspart  0-5 Units Subcutaneous QHS  . insulin aspart  15 Units Subcutaneous TID WC  . insulin glargine  50 Units Subcutaneous BID  . levothyroxine  50 mcg Oral Q0600  . losartan  25 mg Oral Daily  . mouth rinse  15 mL Mouth Rinse BID  . montelukast  10 mg Oral QHS  . pantoprazole  40 mg Oral Daily  . polyethylene glycol  17 g Oral  Daily  . rosuvastatin  40 mg Oral Daily   Continuous Infusions:   LOS: 8 days    Time spent: 25 minutes   Dessa Phi, DO [facility] [email or web address] 12/16/2018, 11:44 AM

## 2018-12-16 NOTE — Chronic Care Management (AMB) (Addendum)
  Care Management Note   Mary Raymond is a 68 y.o. year old female who is a primary care patient of [name], FNP. The CM team was consulted for assistance with chronic disease management and care coordination.   I reached out to [name] / daughter of client by phone today.   Ms. [name] were given information about Chronic Care Management services today including:  1. CCM service includes personalized support from designated clinical staff supervised by her physician, including individualized plan of care and coordination with other care providers 2. 24/7 contact phone numbers for assistance for urgent and routine care needs. 3. Service will only be billed when office clinical staff spend 20 minutes or more in a month to coordinate care. 4. Only one practitioner may furnish and bill the service in a calendar month. 5. The patient may stop CCM services at any time (effective at the end of the month) by phone call to the office staff. 6. The patient will be responsible for cost sharing (co-pay) of up to 20% of the service fee (after annual deductible is met). Patient/ daughter did not agree to services and wish to consider information provided before deciding about enrollment in CCM services.    Review of patient status, including review of consultants reports, relevant laboratory and other test results, and collaboration with appropriate care team members and the patient's provider was performed as part of comprehensive patient evaluation and provision of chronic care management services.   Follow Up Plan: [name] in next 2 weeks to talk further with client/daughter about CCM program services  [name].Lani Mendiola MSW, LCSW Licensed Clinical Social Worker [facility] Care Management [phone]  I have reviewed and agree with the above  documentation.   Evelina Dun, FNP

## 2018-12-16 NOTE — Patient Instructions (Signed)
Licensed Clinical Social Worker Visit Information  Materials provided: No  Ms. Mccurley /daughter were given information about Chronic Care Management services today including:  1. CCM service includes personalized support from designated clinical staff supervised by her physician, including individualized plan of care and coordination with other care providers 2. 24/7 contact phone numbers for assistance for urgent and routine care needs. 3. Service will only be billed when office clinical staff spend 20 minutes or more in a month to coordinate care. 4. Only one practitioner may furnish and bill the service in a calendar month. 5. The patient may stop CCM services at any time (effective at the end of the month) by phone call to the office staff. 6. The patient will be responsible for cost sharing (co-pay) of up to 20% of the service fee (after annual deductible is met).  Patient/daughter did not agree to services and wishes to consider information provided before deciding about enrollment in CCM services.   Follow Up Plan:  LCSW to call patient/daughter of patient in next 2 weeks to further discuss CCM program services with patient/daughter of patient  The patient/daughter verbalized understanding of instructions provided today and declined a print copy of patient instruction materials.   [name].Garo Heidelberg MSW, LCSW Licensed Clinical Social Worker [facility] Care Management [phone]

## 2018-12-16 NOTE — Progress Notes (Addendum)
Occupational Therapy Treatment Patient Details Name: Mary Raymond MRN: [id number] DOB: [birth year] Today's Date: 12/16/2018    History of present illness Pt is a 68 y/o female admitted secondary to worsening SOB thought to be secondary to CHF. Pt also with severe CAD, however, is not a surgical candidate and per cardiology notes, will be managed medically. PMH includes DM, anxiety, CAD, asthma, CVA, MI, pacemaker, CKD.    OT comments  Patient progressing well.  She is completing LB dressing and transfers using RW with supervision.  Educated on tub transfers using RW and reverse step but patient reports RW will not fit next to tub and she will have her daughter assist as needed.  Continue to recommend SNF short stay rehab, as patient reports her family is highly resistive to others entering their home and she will highly benefit from rehab services due to generalized weakness and decreased activity tolerance.  If insurance declines, continue to recommend [facility].    Follow Up Recommendations  SNF;Supervision/Assistance - 24 hour(if insurance declines HHOT )    Equipment Recommendations  None recommended by OT    Recommendations for Other Services      Precautions / Restrictions Precautions Precautions: Fall Restrictions Weight Bearing Restrictions: No       Mobility Bed Mobility               General bed mobility comments: OOB in chair upon entry   Transfers Overall transfer level: Needs assistance Equipment used: Rolling walker (2 wheeled) Transfers: Sit to/from [employer] Transfers Sit to Stand: Supervision         General transfer comment: supervision for safety     Balance Overall balance assessment: Needs assistance Sitting-balance support: No upper extremity supported;Feet supported Sitting balance-Leahy Scale: Good     Standing balance support: Bilateral upper extremity supported;During functional activity Standing balance-Leahy Scale:  Poor Standing balance comment: reliant on RW support                           ADL either performed or assessed with clinical judgement   ADL Overall ADL's : Needs assistance/impaired             Lower Body Bathing: Supervison/ safety;Sitting/lateral leans       Lower Body Dressing: Supervision/safety;Sit to/from stand Lower Body Dressing Details (indicate cue type and reason): donning socks with supervision, sit to stand with supervision  Toilet Transfer: Supervision/safety;RW;Ambulation       Tub/ Shower Transfer: Supervision/safety;Walk-in shower;Ambulation;3 in 1 Tub/Shower Transfer Details (indicate cue type and reason): reverse step into shower with supervision using RW, educated on technique for tub transfer at home--pt reports RW will not fit next to tub and daughter always assists  Functional mobility during ADLs: Supervision/safety;Rolling walker General ADL Comments: supervision for safety     Vision       Perception     Praxis      Cognition Arousal/Alertness: Awake/alert Behavior During Therapy: WFL for tasks assessed/performed Overall Cognitive Status: Within Functional Limits for tasks assessed                                          Exercises     Shoulder Instructions       General Comments      Pertinent Vitals/ Pain       Pain Assessment: No/denies pain  Home Living                                          Prior Functioning/Environment              Frequency  Min 2X/week        Progress Toward Goals  OT Goals(current goals can now be found in the care plan section)  Progress towards OT goals: Progressing toward goals  Acute Rehab OT Goals Patient Stated Goal: to get to rehab  OT Goal Formulation: With patient Time For Goal Achievement: 12/27/18 Potential to Achieve Goals: Good  Plan Discharge plan remains appropriate;Frequency remains appropriate    Co-evaluation                  AM-PAC OT "6 Clicks" Daily Activity     Outcome Measure   Help from another person eating meals?: None Help from another person taking care of personal grooming?: None Help from another person toileting, which includes using toliet, bedpan, or urinal?: A Little Help from another person bathing (including washing, rinsing, drying)?: None Help from another person to put on and taking off regular upper body clothing?: None Help from another person to put on and taking off regular lower body clothing?: None 6 Click Score: 23    End of Session Equipment Utilized During Treatment: Rolling walker  OT Visit Diagnosis: Unsteadiness on feet (R26.81);Other abnormalities of gait and mobility (R26.89);Muscle weakness (generalized) (M62.81);History of falling (Z91.81)   Activity Tolerance Patient tolerated treatment well   Patient Left Other (comment);with call bell/phone within reach(seated in shower )   Nurse Communication Mobility status        Time: 7096-2836 OT Time Calculation (min): 18 min  Charges: OT General Charges $OT Visit: 1 Visit OT Treatments $Self Care/Home Management : 8-22 mins  Delight Stare, [place] Pager [phone] Office [phone]     Delight Stare 12/16/2018, 10:09 AM

## 2018-12-16 NOTE — Telephone Encounter (Signed)
Pt contacted per Dr Harl Bowie. History reviewed Pt supposed to d/c'd today, declined telehealth visit at this time. Based on discussion, with current pandemic situation, we have postponed 12/19/18 appointment until June 2020. If symptoms change, pt has been instructed to contact our office

## 2018-12-16 NOTE — TOC Progression Note (Signed)
Transition of Care [facility]) - Progression Note    Patient Details  Name: Mary Raymond MRN: [id number] Date of Birth: 06-25-1951  Transition of Care [facility]) CM/SW [place], [place] Phone Number: 12/16/2018, 4:00 PM  Clinical Narrative:    Patient's insurance was denied. Patient will discharge home. MD, RN, and CM updated .   Expected Discharge Plan: Skilled Nursing Facility Barriers to Discharge: Insurance Authorization  Expected Discharge Plan and Services Expected Discharge Plan: [place]       Living arrangements for the past 2 months: Single Family Home                           Social Determinants of Health (SDOH) Interventions    Readmission Risk Interventions Readmission Risk Prevention Plan 12/12/2018 12/10/2018  Transportation Screening - Complete  Medication Review [employer]) - Complete  PCP or Specialist appointment within 3-5 days of discharge Not Complete Complete  HRI or [place] - Complete  SW Recovery Care/Counseling Consult - Complete  [place] - Patient Refused  Some recent data might be hidden

## 2018-12-16 NOTE — Progress Notes (Signed)
Physical Therapy Treatment Patient Details Name: Mary Raymond MRN: [id number] DOB: 1950-12-05 Today's Date: 12/16/2018    History of Present Illness Pt is a 68 y/o female admitted secondary to worsening SOB thought to be secondary to CHF. Pt also with severe CAD, however, is not a surgical candidate and per cardiology notes, will be managed medically. PMH includes DM, anxiety, CAD, asthma, CVA, MI, pacemaker, CKD.     PT Comments    Pt making good progress with mobility. Amb distance is limited by sacral pain which has been a chronic issue for pt. Pt reports her son in law will likely not agree to have [facility] providers come in to the home at this time so feel she would get more therapy at SNF but understand this is unlikely.    Follow Up Recommendations  SNF(if denied then attempt HHPT)     Equipment Recommendations  Other (comment)(pt would like rollator but recently received rolling walker )    Recommendations for Other Services       Precautions / Restrictions Precautions Precautions: Fall Restrictions Weight Bearing Restrictions: No    Mobility  Bed Mobility               General bed mobility comments: Up in chair  Transfers Overall transfer level: Needs assistance Equipment used: Rolling walker (2 wheeled) Transfers: Sit to/from Stand Sit to Stand: Supervision         General transfer comment: supervision for safety  Ambulation/Gait Ambulation/Gait assistance: Supervision Gait Distance (Feet): 40 Feet(x 2) Assistive device: 4-wheeled walker Gait Pattern/deviations: Step-through pattern;Decreased stride length;Trunk flexed Gait velocity: decr Gait velocity interpretation: <1.31 ft/sec, indicative of household ambulator General Gait Details: Assist for safety. Distance limited by sacral pain.    Stairs             Wheelchair Mobility    Modified Rankin (Stroke Patients Only)       Balance Overall balance assessment: Needs  assistance Sitting-balance support: No upper extremity supported;Feet supported Sitting balance-Leahy Scale: Good     Standing balance support: During functional activity;Single extremity supported Standing balance-Leahy Scale: Poor Standing balance comment: UE support                            Cognition Arousal/Alertness: Awake/alert Behavior During Therapy: WFL for tasks assessed/performed Overall Cognitive Status: Within Functional Limits for tasks assessed                                        Exercises      General Comments        Pertinent Vitals/Pain Pain Assessment: Faces Faces Pain Scale: Hurts even more Pain Location: sacrum Pain Descriptors / Indicators: Sore;Dull Pain Intervention(s): Monitored during session;Repositioned    Home Living                      Prior Function            PT Goals (current goals can now be found in the care plan section) Acute Rehab PT Goals Patient Stated Goal: to get to rehab  Progress towards PT goals: Progressing toward goals    Frequency    Min 3X/week      PT Plan Current plan remains appropriate    Co-evaluation  AM-PAC PT "6 Clicks" Mobility   Outcome Measure  Help needed turning from your back to your side while in a flat bed without using bedrails?: None Help needed moving from lying on your back to sitting on the side of a flat bed without using bedrails?: A Little Help needed moving to and from a bed to a chair (including a wheelchair)?: A Little Help needed standing up from a chair using your arms (e.g., wheelchair or bedside chair)?: A Little Help needed to walk in hospital room?: A Little Help needed climbing 3-5 steps with a railing? : A Lot 6 Click Score: 18    End of Session   Activity Tolerance: Patient tolerated treatment well;Patient limited by pain Patient left: in chair;with call bell/phone within reach   PT Visit Diagnosis: Other  abnormalities of gait and mobility (R26.89);Unsteadiness on feet (R26.81);Muscle weakness (generalized) (M62.81)     Time: 1115-1130 PT Time Calculation (min) (ACUTE ONLY): 15 min  Charges:  $Gait Training: 8-22 mins                     [place] Pager [phone] Office [place] 12/16/2018, 12:13 PM

## 2018-12-16 NOTE — Plan of Care (Signed)
  Problem: Health Behavior/Discharge Planning: Goal: Ability to manage health-related needs will improve Outcome: Progressing   Problem: Clinical Measurements: Goal: Diagnostic test results will improve Outcome: Progressing Goal: Respiratory complications will improve Outcome: Progressing Goal: Cardiovascular complication will be avoided Outcome: Progressing   Problem: Activity: Goal: Risk for activity intolerance will decrease Outcome: Progressing   Problem: Coping: Goal: Level of anxiety will decrease Outcome: Progressing   Problem: Skin Integrity: Goal: Risk for impaired skin integrity will decrease Outcome: Progressing   Problem: Education: Goal: Ability to demonstrate management of disease process will improve Outcome: Progressing Goal: Ability to verbalize understanding of medication therapies will improve Outcome: Progressing Goal: Individualized Educational Video(s) Outcome: Progressing   Problem: Activity: Goal: Capacity to carry out activities will improve Outcome: Progressing

## 2018-12-17 ENCOUNTER — Telehealth: Payer: Self-pay | Admitting: Licensed Clinical Social Worker

## 2018-12-17 ENCOUNTER — Ambulatory Visit: Payer: Self-pay | Admitting: Licensed Clinical Social Worker

## 2018-12-17 DIAGNOSIS — E1159 Type 2 diabetes mellitus with other circulatory complications: Secondary | ICD-10-CM

## 2018-12-17 DIAGNOSIS — I509 Heart failure, unspecified: Secondary | ICD-10-CM

## 2018-12-17 DIAGNOSIS — K219 Gastro-esophageal reflux disease without esophagitis: Secondary | ICD-10-CM

## 2018-12-17 DIAGNOSIS — R627 Adult failure to thrive: Secondary | ICD-10-CM

## 2018-12-17 DIAGNOSIS — F419 Anxiety disorder, unspecified: Secondary | ICD-10-CM

## 2018-12-17 DIAGNOSIS — E119 Type 2 diabetes mellitus without complications: Secondary | ICD-10-CM

## 2018-12-17 DIAGNOSIS — R531 Weakness: Secondary | ICD-10-CM

## 2018-12-17 DIAGNOSIS — Z794 Long term (current) use of insulin: Secondary | ICD-10-CM

## 2018-12-17 LAB — GLUCOSE, CAPILLARY
Glucose-Capillary: 127 mg/dL — ABNORMAL HIGH (ref 70–99)
Glucose-Capillary: 135 mg/dL — ABNORMAL HIGH (ref 70–99)
Glucose-Capillary: 221 mg/dL — ABNORMAL HIGH (ref 70–99)
Glucose-Capillary: 255 mg/dL — ABNORMAL HIGH (ref 70–99)

## 2018-12-17 LAB — BASIC METABOLIC PANEL
Anion gap: 12 (ref 5–15)
BUN: 54 mg/dL — ABNORMAL HIGH (ref 8–23)
CO2: 30 mmol/L (ref 22–32)
Calcium: 9.6 mg/dL (ref 8.9–10.3)
Chloride: 94 mmol/L — ABNORMAL LOW (ref 98–111)
Creatinine, Ser: 1.69 mg/dL — ABNORMAL HIGH (ref 0.44–1.00)
GFR calc Af Amer: 36 mL/min — ABNORMAL LOW (ref >60)
GFR, EST NON AFRICAN AMERICAN: 31 mL/min — AB (ref >60)
Glucose, Bld: 169 mg/dL — ABNORMAL HIGH (ref 70–99)
Potassium: 3.6 mmol/L (ref 3.5–5.1)
SODIUM: 136 mmol/L (ref 135–145)

## 2018-12-17 LAB — CBC
HCT: 30 % — ABNORMAL LOW (ref 36.0–46.0)
Hemoglobin: 9.1 g/dL — ABNORMAL LOW (ref 12.0–15.0)
MCH: 24.7 pg — AB (ref 26.0–34.0)
MCHC: 30.3 g/dL (ref 30.0–36.0)
MCV: 81.3 fL (ref 80.0–100.0)
Platelets: 254 10*3/uL (ref 150–400)
RBC: 3.69 MIL/uL — ABNORMAL LOW (ref 3.87–5.11)
RDW: 16.4 % — ABNORMAL HIGH (ref 11.5–15.5)
WBC: 16.8 10*3/uL — ABNORMAL HIGH (ref 4.0–10.5)
nRBC: 0 % (ref 0.0–0.2)

## 2018-12-17 MED ORDER — INSULIN GLARGINE 100 UNIT/ML SOLOSTAR PEN: 50.0000 [IU] | PEN_INJECTOR | Freq: Two times a day (BID) | SUBCUTANEOUS | 0 refills | Status: DC

## 2018-12-17 MED ORDER — POLYETHYLENE GLYCOL 3350 17 G PO PACK: 17.0000 g | PACK | Freq: Every day | ORAL | 0 refills | Status: AC

## 2018-12-17 MED ORDER — FUROSEMIDE 80 MG PO TABS: 80.0000 mg | ORAL_TABLET | Freq: Every day | ORAL | 0 refills | Status: AC

## 2018-12-17 MED ORDER — NITROGLYCERIN 0.4 MG SL SUBL: 0.4000 mg | SUBLINGUAL_TABLET | SUBLINGUAL | 0 refills | Status: AC | PRN

## 2018-12-17 MED ORDER — LOSARTAN POTASSIUM 25 MG PO TABS: 25.0000 mg | ORAL_TABLET | Freq: Every day | ORAL | 0 refills | Status: AC

## 2018-12-17 NOTE — TOC Progression Note (Addendum)
Transition of Care [facility]) - Discharge Note   Patient Details  Name: Mary Raymond MRN: [id number] Date of Birth: 1951-06-06  [facility] CM/SW Contact  Maryclare Labrador, RN Phone Number: 12/17/2018, 9:40 AM  Clinical Narrative:  Insurance officially declined SNF late yesterday.  Pt will discharge home today in care of daughter- daughter will transport home via private vehicle.  Per pt; daughter will provide recommended 24 hour supervision at discharge.  Pt adamantly refused HH and outpt therapy at this time - pt told CM " lets leave all that alone for now, I will see how I feel".  Attending aware of refusal for [facility].  CM informed pt that she can get her PCP to arrange either post discharge. Per Epic; pts PCP has arranged for Case Management via office.   Pt requested a rollator - however pt very recently received RW.  CM confirmed with [employer] that pt will have to pay out of pocket for rollator approximately $150.  CM contacted PT this am and informed that SNF has been denied and that pt is refusing all therapy.   CM requested assessment for discharge home.  Update:  CM offered choice for wheelchair - pt chose [employer] - agency contacted and referral accepted.  Pt declined to pay out of pocket for rollator - pt plans to purchase one from [facility] post discharge.  Pt on RA.   Pt continues to refuse all post discharge therapy. NO other CM needs identified - CM signing off    Expected Discharge Plan: [place] Barriers to Discharge: Barriers Resolved  Expected Discharge Plan and Services Expected Discharge Plan: [place] Choice: Durable Medical Equipment Living arrangements for the past 2 months: Single Family Home Expected Discharge Date: 12/17/18               DME Arranged: Wheelchair manual DME Agency: [employer] HH Arranged: Refused HH     Social Determinants of Health (SDOH) Interventions    Readmission Risk  Interventions Readmission Risk Prevention Plan 12/12/2018 12/10/2018  Transportation Screening - Complete  Medication Review [employer] photographer) - Complete  PCP or Specialist appointment within 3-5 days of discharge Not Complete Complete  HRI or [place] - Complete  SW Recovery Care/Counseling Consult - Complete  [place] - Patient Refused  Some recent data might be hidden

## 2018-12-17 NOTE — Patient Instructions (Signed)
Licensed Clinical Social Worker Visit Information  Goals we discussed today:   Materials provided: No  [name] was given information about Chronic Care Management services today including:  1. CCM service includes personalized support from designated clinical staff supervised by her physician, including individualized plan of care and coordination with other care providers 2. 24/7 contact phone numbers for assistance for urgent and routine care needs. 3. Service will only be billed when office clinical staff spend 20 minutes or more in a month to coordinate care. 4. Only one practitioner may furnish and bill the service in a calendar month. 5. The patient may stop CCM services at any time (effective at the end of the month) by phone call to the office staff. 6. The patient will be responsible for cost sharing (co-pay) of up to 20% of the service fee (after annual deductible is met).  Patient did not agree to services and wishes to consider information provided before deciding about enrollment in CCM services.    Follow Up Plan:  [name] in next 3 weeks to talk further with client/daughter of client about CCM program services  The patient verbalized understanding of instructions provided today and declined a print copy of patient instruction materials.   Mary Raymond.Mary Raymond MSW, LCSW Licensed Clinical Social Worker [facility] Care Management [phone]

## 2018-12-17 NOTE — Progress Notes (Signed)
Physical Therapy Treatment Patient Details Name: Mary Raymond MRN: [id number] DOB: Feb 10, 1951 Today's Date: 12/17/2018    History of Present Illness Pt is a 68 y/o female admitted secondary to worsening SOB thought to be secondary to CHF. Pt also with severe CAD, however, is not a surgical candidate and per cardiology notes, will be managed medically. PMH includes DM, anxiety, CAD, asthma, CVA, MI, pacemaker, CKD.     PT Comments    Session focused on providing pt with lower extremity HEP for strengthening and reviewing. Pt performed with good carryover. Education provided re: weighing self daily, generalized walking program, activity progression. Plan for d/c home today. Please see below for recommendations.   Follow Up Recommendations  Home health PT;Supervision for mobility/OOB     Equipment Recommendations  Other (comment);Wheelchair (measurements PT)(rollator)    Recommendations for Other Services       Precautions / Restrictions Precautions Precautions: Fall Restrictions Weight Bearing Restrictions: No    Mobility  Bed Mobility               General bed mobility comments: up in chair   Transfers                    Ambulation/Gait                 Stairs             Wheelchair Mobility    Modified Rankin (Stroke Patients Only)       Balance     Sitting balance-Leahy Scale: Good                                      Cognition Arousal/Alertness: Awake/alert Behavior During Therapy: WFL for tasks assessed/performed Overall Cognitive Status: Within Functional Limits for tasks assessed                                        Exercises General Exercises - Upper Extremity Shoulder Flexion: Strengthening;10 reps;Both;Theraband;Seated Theraband Level (Shoulder Flexion): Level 3 (Green) Shoulder Extension: Strengthening;Both;10 reps;Theraband;Seated Theraband Level (Shoulder Extension): Level 3  (Green) Shoulder ABduction: Strengthening;10 reps;Both;Seated;Theraband Theraband Level (Shoulder Abduction): Level 3 (Green) Shoulder ADduction: Strengthening;Both;10 reps;Seated;Theraband Theraband Level (Shoulder Adduction): Level 3 (Green) Elbow Flexion: Strengthening;Both;10 reps;Seated;Theraband Theraband Level (Elbow Flexion): Level 3 (Green) Elbow Extension: Strengthening;10 reps;Both;Seated;Theraband Theraband Level (Elbow Extension): Level 3 (Green) General Exercises - Lower Extremity Ankle Circles/Pumps: 10 reps;Both;Seated Quad Sets: 10 reps;Both;Seated Short Arc Quad: 10 reps;Both Long Arc Quad: 10 reps;Both Hip ABduction/ADduction: 5 reps;Both Hip Flexion/Marching: 10 reps;Both;Seated    General Comments        Pertinent Vitals/Pain Pain Assessment: No/denies pain    Home Living                      Prior Function            PT Goals (current goals can now be found in the care plan section) Acute Rehab PT Goals Patient Stated Goal: home today Potential to Achieve Goals: Good Progress towards PT goals: Progressing toward goals    Frequency    Min 3X/week      PT Plan Current plan remains appropriate    Co-evaluation              AM-PAC PT "6 Clicks" Mobility  Outcome Measure  Help needed turning from your back to your side while in a flat bed without using bedrails?: None Help needed moving from lying on your back to sitting on the side of a flat bed without using bedrails?: A Little Help needed moving to and from a bed to a chair (including a wheelchair)?: A Little Help needed standing up from a chair using your arms (e.g., wheelchair or bedside chair)?: A Little Help needed to walk in hospital room?: A Little Help needed climbing 3-5 steps with a railing? : A Lot 6 Click Score: 18    End of Session   Activity Tolerance: Patient tolerated treatment well;Patient limited by pain Patient left: in chair;with call bell/phone  within reach Nurse Communication: Mobility status PT Visit Diagnosis: Other abnormalities of gait and mobility (R26.89);Unsteadiness on feet (R26.81);Muscle weakness (generalized) (M62.81)     Time: 6644-0347 PT Time Calculation (min) (ACUTE ONLY): 19 min  Charges:  $Therapeutic Exercise: 8-22 mins                     Ellamae Sia, PT, DPT Acute Rehabilitation Services Pager [phone] Office [phone]    Willy Eddy 12/17/2018, 10:47 AM

## 2018-12-17 NOTE — Discharge Summary (Signed)
Discharge Summary  [name] JGG:[id number] DOB: [birth year]  [name], FNP  Admit date: 12/08/2018 Discharge date: 12/17/2018  Time spent: 67mns, more than 50% time spent on coordination of care.  Recommendations for Outpatient Follow-up:  1. F/u with PCP within a week  for hospital discharge follow up, repeat cbc/bmp at follow up 2. F/u with cardiology Dr BNicholes Mango3. Insurance declined SNF placement, patient is to go home with home health and walker/wheelchair, home health RN for disease heart failure/diabetes  management  Discharge Diagnoses:  Active Hospital Problems   Diagnosis Date Noted   Acute on chronic diastolic CHF (congestive heart failure) (HCC)    Anemia 12/08/2018   Troponin level elevated 06/05/2018   GERD (gastroesophageal reflux disease) 11/15/2014   Anxiety    Hyperlipidemia associated with type 2 diabetes mellitus ([facility]    Type II diabetes mellitus ([facility]    Hypothyroidism 02/25/2013    Resolved Hospital Problems   Diagnosis Date Noted Date Resolved   Acute coronary syndrome ([facility] 12/08/2018 12/15/2018    Discharge Condition: stable  Diet recommendation: heart healthy/carb modified  Filed Weights   12/15/18 0300 12/16/18 0500 12/17/18 0300  Weight: 121.6 kg 118.1 kg 119 kg    History of present illness: (per admitting MD Dr OOlevia Bowens PCP: HSharion Balloon FNP   Patient coming from: Home.  I [place] personally [facility] [facility] [place] [place] Chief Complaint: Chest pain.  HPI: Mary Raymond a 68y.o. female with medical history significant of anxiety, osteoarthritis, asthma, chronic back pain, coronary artery disease, diabetic peripheral neuropathy, type 2 diabetes, fibromyalgia, GERD, hyperlipidemia, hypertension, hypothyroidism, migraine headaches, CAD, history of MI, obesity, history of pneumonia at least twice, occipital CVA, type 2 diabetes, vitamin D deficiency who is coming  to the emergency department with a history of on and off chest pain, pressure-like, non-radiated, associated with shortness of breath.  She has also seen that she has been having significant lower extremity edema and orthopnea.  The chest pain may or may not be related to exertion.  She is states that she might get discomfort even when she is sitting.  She denies PND diaphoresis or dizziness.  She feels a wet cough that she is unable to expectorate.  She denies fever, chills, sore throat, wheezing, hemoptysis, abdominal pain, nausea or emesis, diarrhea or constipation, melena or hematochezia.  No dysuria, frequency or hematuria.  She denies polyuria, polydipsia or polyphagia.  Denies skin pruritus.  ED Course: Initial vital signs temperature 98.5 F, pulse 93, respirations 22, blood pressure 131/62 mmHg and O2 sat 97% in room air.  The patient was given supplemental oxygen, 40 mg of furosemide and was started on a heparin infusion.  I added morphine, Zofran and magnesium sulfate.  White count was 14.1, hemoglobin 8.7 g/dL and platelets 262.  BNP was 249.0 pg/mL.   Magnesium was 1.8 mg/mL.  BMP shows normal electrolytes but her glucose was 214, BUN 42 and creatinine 1.88 mg/dL. Troponin was 0.34 ng/mL. EKG was ventricular paced without significant change.  Chest radiograph showed cardiomegaly with vascular congestion.   Hospital Course:  Principal Problem:   Acute on chronic diastolic CHF (congestive heart failure) (HCC) Active Problems:   Hypothyroidism   Hyperlipidemia associated with type 2 diabetes mellitus (HCC)   Type II diabetes mellitus (HCC)   Anxiety   GERD (gastroesophageal reflux disease)   Troponin level elevated   Anemia   Acute on chronic diastolic heart  failure -EF 55-60%  -Appreciate cardiology, continue Lasix 80 mg daily, Cozaar  -Has diuresed -6.8L since admission (in and outs not accurately measured) -Weight 278lb --> 260lb  -she is to follow up with cardiology Dr  Nicholes Mango   Coronary artery disease -Multivessel CAD s/p DES x 2 to the RCA with residual unrevascularized LM and LCx disease -Not felt to be a candidate for CABG due to very limited functional state -Appreciate cardiology -Continue aspirin, Plavix, prn nitroglycerin  F/u with cardiology  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 Rayann Heman  Chronic kidney disease stage IV  -Baseline Cr appears to range from 1.25-1.99  -Stable today, 1.69  Anemia of chronic disease hgb 9, stable at baseline   Hypothyroidism -Continue levothyroxine  Hyperlipidemia -Continuerosuvastatin  DM2 with hyperglycemia , insulin dependent -Lantus, SSI, Novolog. Tendency to be hyperglycemic -close follow up with pcp, insulin adjustment per pcp  Anxiety -Continue alprazolam   GERD -Continue Protonix  Normocytic Anemia -Secondary to CKD - baseline Hgb ~9.0 as of Feb 2020  -Stable Hgb 8.8  Morbid obesity -Body mass index is 44.35 kg/m.   DVT prophylaxis while in the hospital: Subcutaneous heparin Code Status: Full code Family Communication: patient  Consultants:   Cardiology  Procedures:   None   Discharge Exam: BP (!) 111/57 (BP Location: Right Arm)    Pulse 64    Temp 97.8 F (36.6 C) (Oral)    Resp 18    Ht '5\' 5"'$  (1.651 m)    Wt 119 kg    SpO2 98%    BMI 43.66 kg/m   General: NAD Cardiovascular: RRR Respiratory: CTABL  Discharge Instructions You were cared for by a hospitalist during your hospital stay. If you have any questions about your discharge medications or the care you received while you were in the hospital after you are discharged, you can call the unit and asked to speak with the hospitalist on call if the hospitalist that took care of you is not available. Once you are discharged, your primary care physician will handle any further medical issues. Please note that NO REFILLS for any discharge medications will  be authorized once you are discharged, as it is imperative that you return to your primary care physician (or establish a relationship with a primary care physician if you do not have one) for your aftercare needs so that they can reassess your need for medications and monitor your lab values.  Discharge Instructions    Diet - low sodium heart healthy   Complete by:  As directed    Carb modified   Increase activity slowly   Complete by:  As directed      Allergies as of 12/17/2018      Reactions   Atorvastatin Other (See Comments)   Leg weakness.   Rosuvastatin Other (See Comments)   Leg weakness      Medication List    STOP taking these medications   hydrALAZINE 50 MG tablet Commonly known as:  APRESOLINE   VITAMIN D PO     TAKE these medications   acyclovir 400 MG tablet Commonly known as:  ZOVIRAX TAKE 1 TABLET EVERY DAY   albuterol 108 (90 Base) MCG/ACT inhaler Commonly known as:  PROVENTIL HFA;VENTOLIN HFA Inhale 1-2 puffs into the lungs every 6 (six) hours as needed for wheezing or shortness of breath.   ALPRAZolam 0.5 MG tablet Commonly known as:  XANAX Take 1 tablet (0.5 mg total) by  mouth at bedtime as needed for anxiety. TAKE 1 TABLET 2 TIMES A DAY AS NEEDED FOR ANXIETY   aspirin 81 MG EC tablet Take 1 tablet (81 mg total) by mouth daily.   blood glucose meter kit and supplies Kit Dispense based on patient and insurance preference. Use up to four times daily as directed. (FOR ICD-9 250.00, 250.01).   clopidogrel 75 MG tablet Commonly known as:  PLAVIX Take 1 tablet (75 mg total) by mouth daily.   ferrous sulfate 325 (65 FE) MG EC tablet Take 1 tablet (325 mg total) by mouth daily with breakfast.   furosemide 80 MG tablet Commonly known as:  LASIX Take 1 tablet (80 mg total) by mouth daily. Start taking on:  December 18, 2018 What changed:    medication strength  how much to take  when to take this   gabapentin 100 MG capsule Commonly known as:   NEURONTIN Take 1 capsule (100 mg total) by mouth 3 (three) times daily.   glucose blood test strip Commonly known as:  Accu-Chek Aviva Plus Test BS QID E11.65   HYDROcodone-acetaminophen 7.5-325 MG tablet Commonly known as:  NORCO Take 1 tablet by mouth every 12 (twelve) hours as needed for moderate pain. What changed:  when to take this   insulin aspart 100 UNIT/ML injection Commonly known as:  novoLOG CBG < 70: implement hypoglycemia protocol CBG 70 - 120: 0 units CBG 121 - 150: 0 units CBG 151 - 200: 0 units CBG 201 - 250: 2 units CBG 251 - 300: 3 units CBG 301 - 350: 4 units CBG 351 - 400: 5 units CBG > 400: call MD   Insulin Glargine 100 UNIT/ML Solostar Pen Commonly known as:  Lantus SoloStar Inject 50 Units into the skin 2 (two) times daily. What changed:  how much to take   Insulin Pen Needle 31G X 5 MM Misc Commonly known as:  Droplet Pen Needles Use to give Lantus BID Dx E11.65   Ipratropium-Albuterol 20-100 MCG/ACT Aers respimat Commonly known as:  Combivent Respimat INHALE 1 PUFF INTO THE LUNGS EVERY 6 (SIX) HOURS AS NEEDED FOR WHEEZING. What changed:    how much to take  how to take this  when to take this  reasons to take this  additional instructions   levothyroxine 50 MCG tablet Commonly known as:  SYNTHROID, LEVOTHROID TAKE 1 TABLET EVERY DAY BEFORE BREAKFAST What changed:    how much to take  how to take this  when to take this  additional instructions   losartan 25 MG tablet Commonly known as:  COZAAR Take 1 tablet (25 mg total) by mouth daily. Start taking on:  December 18, 2018 What changed:    medication strength  how much to take   metFORMIN 1000 MG tablet Commonly known as:  GLUCOPHAGE Take 1 tablet (1,000 mg total) by mouth 2 (two) times daily with a meal.   montelukast 10 MG tablet Commonly known as:  SINGULAIR Take 1 tablet (10 mg total) by mouth at bedtime.   nitroGLYCERIN 0.4 MG SL tablet Commonly known as:   NITROSTAT Place 1 tablet (0.4 mg total) under the tongue every 5 (five) minutes x 3 doses as needed for chest pain.   ondansetron 4 MG tablet Commonly known as:  ZOFRAN Take 4 mg by mouth every 8 (eight) hours as needed for nausea or vomiting.   pantoprazole 40 MG tablet Commonly known as:  PROTONIX Take 1 tablet (40 mg total) by mouth  daily.   polyethylene glycol packet Commonly known as:  MIRALAX / GLYCOLAX Take 17 g by mouth daily. Start taking on:  December 18, 2018   rosuvastatin 40 MG tablet Commonly known as:  CRESTOR Take 1 tablet (40 mg total) by mouth daily.   Semaglutide(0.25 or 0.'5MG'$ /DOS) 2 MG/1.5ML Sopn Commonly known as:  Ozempic (0.25 or 0.5 MG/DOSE) Inject 0.25 mg into the skin once a week for 28 days, THEN 0.5 mg once a week for 28 days. Start taking on:  October 28, 2018   vitamin C 500 MG tablet Commonly known as:  ASCORBIC ACID Take 500 mg by mouth daily.   Vitamin D 50 MCG (2000 UT) tablet Take 2,000 Units by mouth daily.            Durable Medical Equipment  (From admission, onward)         Start     Ordered   12/17/18 0947  For home use only DME Walker rolling  Once    Question:  Patient needs a walker to treat with the following condition  Answer:  Balance problem   12/17/18 0946   12/17/18 0834  For home use only DME standard manual wheelchair with seat cushion  (Wheelchairs)  Once    Comments:  Patient suffers from balance issues which impairs their ability to perform daily activities like toileting in the home.  A walker will not resolve  issue with performing activities of daily living. A wheelchair will allow patient to safely perform daily activities. Patient can safely propel the wheelchair in the home or has a caregiver who can provide assistance.  Accessories: elevating leg rests (ELRs), wheel locks, extensions and anti-tippers.   12/17/18 0834         Allergies  Allergen Reactions   Atorvastatin Other (See Comments)    Leg  weakness.   Rosuvastatin Other (See Comments)    Leg weakness     Contact information for follow-up providers    [facility] Follow up.        FNP Evelina Dun Follow up on 12/22/2018.   Why:  10:45 post discharge follow up appt            Contact information for after-discharge care    Destination    [facility] .   Service:  Skilled Nursing Contact information: [address] [place] [phone]                   The results of significant diagnostics from this hospitalization (including imaging, microbiology, ancillary and laboratory) are listed below for reference.    Significant Diagnostic Studies: Dg Chest 1 View  Result Date: 12/08/2018 CLINICAL DATA:  Shortness of breath today. Cough. EXAM: CHEST  1 VIEW COMPARISON:  07/24/2018 FINDINGS: Inferior aspect of the chest is not included in the field of view. Left-sided pacemaker in place with ventricular lead partially excluded. Cardiomegaly is slightly progressed from prior. There is vascular congestion. Subsegmental atelectasis in the left mid lung. No confluent airspace disease. No pneumothorax or large pleural effusion. No acute osseous abnormalities are seen. IMPRESSION: Cardiomegaly with vascular congestion. Electronically Signed   By: Keith Rake M.D.   On: 12/08/2018 19:40    Microbiology: Recent Results (from the past 240 hour(s))  MRSA PCR Screening     Status: None   Collection Time: 12/08/18 11:58 PM  Result Value Ref Range Status   MRSA by PCR  NEGATIVE NEGATIVE Final    Comment:        The GeneXpert MRSA Assay (FDA approved for NASAL specimens only), is one component of a comprehensive MRSA colonization surveillance program. It is not intended to diagnose MRSA infection nor to guide or monitor treatment for MRSA infections. Performed at [facility], [facility] [address], [place], [place]  [address]      Labs: Basic Metabolic Panel: Recent Labs  Lab 12/12/18 0210 12/14/18 0211 12/15/18 0224 12/16/18 0226 12/17/18 0226  NA 134* 135 133* 136 136  K 4.0 4.0 4.1 3.7 3.6  CL 92* 94* 91* 93* 94*  CO2 '28 29 29 '$ 34* 30  GLUCOSE 346* 295* 348* 247* 169*  BUN 51* 56* 56* 55* 54*  CREATININE 2.40* 1.86* 1.89* 1.79* 1.69*  CALCIUM 9.1 9.3 9.2 9.6 9.6   Liver Function Tests: No results for input(s): AST, ALT, ALKPHOS, BILITOT, PROT, ALBUMIN in the last 168 hours. No results for input(s): LIPASE, AMYLASE in the last 168 hours. No results for input(s): AMMONIA in the last 168 hours. CBC: Recent Labs  Lab 12/12/18 0210 12/14/18 0211 12/15/18 0224 12/16/18 0226 12/17/18 0226  WBC 14.1* 13.3* 14.3* 14.4* 16.8*  HGB 8.7* 9.1* 9.0* 8.8* 9.1*  HCT 28.7* 29.6* 29.6* 29.9* 30.0*  MCV 82.0 81.3 81.3 81.7 81.3  PLT 243 256 254 245 254   Cardiac Enzymes: No results for input(s): CKTOTAL, CKMB, CKMBINDEX, TROPONINI in the last 168 hours. BNP: BNP (last 3 results) Recent Labs    06/05/18 1526 07/24/18 1700 12/08/18 1939  BNP 197.0* 49.0 249.0*    ProBNP (last 3 results) No results for input(s): PROBNP in the last 8760 hours.  CBG: Recent Labs  Lab 12/16/18 2016 12/16/18 2326 12/17/18 0346 12/17/18 0608 12/17/18 1114  GLUCAP 314* 224* 127* 135* 221*       Signed:  Florencia Reasons MD, PhD  [facility] 12/17/2018, 11:43 AM

## 2018-12-17 NOTE — Progress Notes (Signed)
All set for discharge home, wheelchair delivered. Awaiting ride home coming at 5pm.

## 2018-12-17 NOTE — Consult Note (Signed)
   [facility] CM Inpatient Consult   12/17/2018  SIEANNA VANSTONE [birth year] [id number]    Follow-up note:  Per transition of care CM note, patient is discharging home today in the care of daughter who will provide 24 hour supervision as recommended. Insurance had declined SNF. Patient continues to refuse all post discharge therapy (home health,Outpatient therapy).   Noted primary care provider's office ([place]) provides transition of care [facility]) follow-up and arranged for Chronic Case Management (CCM) services via office, per embedded practice SW Nicki Reaper) following and practice RN Cyril Mourning [place]).  With patient's declining services, felt that she may benefit from [facility] calls.  Referral made for EMMI General calls to follow-up patient's recovery at home. Embedded Education officer, museum made aware and states to direct calls to patient's daughter Dayonna Selbe phone no: [phone] per patient's request, since she will be staying with daughter post discharge. Will to notify transition of care CM regarding updates.    For questions and additional information, please contact:  Raevyn Sokol A. Dionel Archey, BSN, RN-BC [facility] Liaison Cell: [phone]

## 2018-12-17 NOTE — Chronic Care Management (AMB) (Addendum)
  Care Management Note   Mary Raymond is a 68 y.o. year old female who is a primary care patient of [name], FNP. The CM team was consulted for assistance with chronic disease management and care coordination.   I reached out to [name] by phone today.   [name] was given information about Chronic Care Management services today including:  1. CCM service includes personalized support from designated clinical staff supervised by her physician, including individualized plan of care and coordination with other care providers 2. 24/7 contact phone numbers for assistance for urgent and routine care needs. 3. Service will only be billed when office clinical staff spend 20 minutes or more in a month to coordinate care. 4. Only one practitioner may furnish and bill the service in a calendar month. 5. The patient may stop CCM services at any time (effective at the end of the month) by phone call to the office staff. 6. The patient will be responsible for cost sharing (co-pay) of up to 20% of the service fee (after annual deductible is met). Patient did not agree to services and wishes to consider information provided before deciding about enrollment in CCM services.    Review of patient status, including review of consultants reports, relevant laboratory and other test results, and collaboration with appropriate care team members and the patient's provider was performed as part of comprehensive patient evaluation and provision of chronic care management services.   LCSW spoke via phone with [name] in detail about CCM program on 12/17/2018.  LCSW described role of LCSW in CCM program services. LCSW described role of RN CM [name] in CCM program services. Client is discharging today from [facility] in [place], [place]. After hospital discharge, client will travel to home of her daughter, Mendel Ryder and client will receive care at home of her daughter.  LCSW  talked with client about home care needs of client.. Client said that her daughter, Mendel Ryder worked first shift.  LCSW also talked with Hospital Liason, Gardiner Fanti RN today regarding client needs. Loraine reported to LCSW that client had declined HHPT and [facility] support on discharge from hospital. LCSW described to client that Chronic Care Management program could be of help to client at this time.  LCSW has also talked with Mendel Ryder, daughter of client, regarding CCM program support.  Follow Up Plan: LCSW to call client/daughter in next 3 weeks to talk more with client/daughter about CCM program services  [name].Nandini Bogdanski MSW, LCSW Licensed Clinical Social Worker [facility] Care Management [phone]  I have reviewed and agree with the above  documentation.   Evelina Dun, FNP

## 2018-12-17 NOTE — Progress Notes (Signed)
Patient suffers from CHF which impairs their ability to perform daily activities like ADL's and mobility in the home.  A walker alone will not resolve the issues with performing activities of daily living. A wheelchair will allow patient to safely perform daily activities.  The patient can self propel in the home or has a caregiver who can provide assistance.     Ellamae Sia, PT, DPT Acute Rehabilitation Services Pager [phone] Office [phone]

## 2018-12-17 NOTE — Telephone Encounter (Signed)
Encounter entered in error.  [name].Kessler Kopinski MSW, LCSW Licensed Clinical Social Worker [facility] Care Management [phone]

## 2018-12-17 NOTE — Progress Notes (Signed)
Occupational Therapy Treatment Patient Details Name: Mary Raymond MRN: [id number] DOB: September 14, 1951 Today's Date: 12/17/2018    History of present illness Pt is a 68 y/o female admitted secondary to worsening SOB thought to be secondary to CHF. Pt also with severe CAD, however, is not a surgical candidate and per cardiology notes, will be managed medically. PMH includes DM, anxiety, CAD, asthma, CVA, MI, pacemaker, CKD.    OT comments  Reviewed home safety, tub transfer safety and recommendations.  Pt provided with and reviewed HEP using green (level 3) theraband.  Patient with no further questions or concerns with dc home.     Follow Up Recommendations  Home health OT;Supervision/Assistance - 24 hour(denied at SNF, pt declining HH but still recommend)    Equipment Recommendations  None recommended by OT    Recommendations for Other Services PT consult    Precautions / Restrictions Precautions Precautions: Fall Restrictions Weight Bearing Restrictions: No       Mobility Bed Mobility               General bed mobility comments: up in chair   Transfers                      Balance     Sitting balance-Leahy Scale: Good                                     ADL either performed or assessed with clinical judgement   ADL                                         General ADL Comments: verbally reviewed safety for bathing, tub transfers with assist; educated on energy conservation     Vision       Perception     Praxis      Cognition Arousal/Alertness: Awake/alert Behavior During Therapy: WFL for tasks assessed/performed Overall Cognitive Status: Within Functional Limits for tasks assessed                                          Exercises Exercises: General Upper Extremity General Exercises - Upper Extremity Shoulder Flexion: Strengthening;10 reps;Both;Theraband;Seated Theraband Level (Shoulder  Flexion): Level 3 (Green) Shoulder Extension: Strengthening;Both;10 reps;Theraband;Seated Theraband Level (Shoulder Extension): Level 3 (Green) Shoulder ABduction: Strengthening;10 reps;Both;Seated;Theraband Theraband Level (Shoulder Abduction): Level 3 (Green) Shoulder ADduction: Strengthening;Both;10 reps;Seated;Theraband Theraband Level (Shoulder Adduction): Level 3 (Green) Elbow Flexion: Strengthening;Both;10 reps;Seated;Theraband Theraband Level (Elbow Flexion): Level 3 (Green) Elbow Extension: Strengthening;10 reps;Both;Seated;Theraband Theraband Level (Elbow Extension): Level 3 (Green)   Shoulder Instructions       General Comments      Pertinent Vitals/ Pain       Pain Assessment: No/denies pain  Home Living                                          Prior Functioning/Environment              Frequency  Min 2X/week        Progress Toward Goals  OT Goals(current goals can now be found in the care  plan section)  Progress towards OT goals: Progressing toward goals  Acute Rehab OT Goals Patient Stated Goal: home today OT Goal Formulation: With patient Time For Goal Achievement: 12/27/18 Potential to Achieve Goals: Good  Plan Frequency remains appropriate;Discharge plan needs to be updated    Co-evaluation                 AM-PAC OT "6 Clicks" Daily Activity     Outcome Measure   Help from another person eating meals?: None Help from another person taking care of personal grooming?: None Help from another person toileting, which includes using toliet, bedpan, or urinal?: None Help from another person bathing (including washing, rinsing, drying)?: None Help from another person to put on and taking off regular upper body clothing?: None Help from another person to put on and taking off regular lower body clothing?: None 6 Click Score: 24    End of Session    OT Visit Diagnosis: Unsteadiness on feet (R26.81);Other abnormalities  of gait and mobility (R26.89);Muscle weakness (generalized) (M62.81);History of falling (Z91.81)   Activity Tolerance Patient tolerated treatment well   Patient Left in chair;with call bell/phone within reach   Nurse Communication Mobility status        Time: 0902-0920 OT Time Calculation (min): 18 min  Charges: OT General Charges $OT Visit: 1 Visit OT Treatments $Therapeutic Exercise: 8-22 mins  Delight Stare, [place] Pager [phone] Office [phone]    Delight Stare 12/17/2018, 9:27 AM

## 2018-12-17 NOTE — Progress Notes (Signed)
Discharged home accompanied by daughter wheel chair taken home,  Discharge instructions given to pt. Belongings taken home.

## 2018-12-18 ENCOUNTER — Telehealth: Payer: Self-pay | Admitting: Family

## 2018-12-18 DIAGNOSIS — I509 Heart failure, unspecified: Secondary | ICD-10-CM | POA: Diagnosis not present

## 2018-12-18 NOTE — Telephone Encounter (Signed)
No, we need to repeat her lab work

## 2018-12-18 NOTE — Telephone Encounter (Signed)
Patient aware and verbalizes understanding. 

## 2018-12-18 NOTE — Telephone Encounter (Signed)
Please review and advise.

## 2018-12-19 ENCOUNTER — Ambulatory Visit: Payer: Medicare HMO | Admitting: Cardiology

## 2018-12-22 ENCOUNTER — Other Ambulatory Visit: Payer: Self-pay | Admitting: Family

## 2018-12-22 ENCOUNTER — Other Ambulatory Visit: Payer: Self-pay | Admitting: *Deleted

## 2018-12-22 ENCOUNTER — Ambulatory Visit: Payer: Medicare HMO | Admitting: Family

## 2018-12-22 DIAGNOSIS — E1165 Type 2 diabetes mellitus with hyperglycemia: Principal | ICD-10-CM

## 2018-12-22 DIAGNOSIS — IMO0001 Reserved for inherently not codable concepts without codable children: Secondary | ICD-10-CM

## 2018-12-22 DIAGNOSIS — Z794 Long term (current) use of insulin: Secondary | ICD-10-CM

## 2018-12-22 NOTE — Patient Outreach (Signed)
[facility]) Care Management  12/22/2018  Mary Raymond 02/08/1951 [id number]   EMMI-general discharge RED ON EMMI ALERT Day # 1  Date: 12/20/18 1007 Saturday  Red Alert Reason:Transportation to follow-up? No  Insurance: [employer] medicare  [facility] admissions x  2 ED visits x 2 in the last 6 months  Last admission at [facility] attempt # 1 No answer. [facility] RN CM left HIPAA compliant voicemail message along with CM's contact info.   Plan: [facility] RN CM sent an unsuccessful outreach letter and scheduled this patient for another call attempt within 4 business days  Herminia Warren L. [name], RN, BSN, [place] Coordinator Office number [phone] Mobile number [phone]  Main [facility] number [phone] Fax number [phone]

## 2018-12-23 ENCOUNTER — Other Ambulatory Visit: Payer: Self-pay | Admitting: *Deleted

## 2018-12-23 NOTE — Patient Outreach (Signed)
[facility]) Care Management  12/23/2018  Mary Raymond [birth year] [id number]   EMMI-general discharge RED ON EMMI ALERT Day # 1  Date: 12/20/18 1007 Saturday  Red Alert Reason:Transportation to follow-up? No  Insurance: [employer] medicare  [facility] admissions x  2 ED visits x 2 in the last 6 months  Last admission at [facility] attempt # 2 No answer. [facility] RN CM left HIPAA compliant voicemail message along with CM's contact info.    Plan: [facility] RN CM sent an unsuccessful outreach letter on 12/22/18, left a second voice message on 12/23/18 and scheduled this patient for another call attempt within 4 business days  [place] L. [name], RN, BSN, [place] Coordinator Office number [phone] Mobile number [phone]  Main [facility] number [phone] Fax number [phone]

## 2018-12-24 ENCOUNTER — Ambulatory Visit: Payer: Self-pay | Admitting: Licensed Clinical Social Worker

## 2018-12-24 DIAGNOSIS — Z794 Long term (current) use of insulin: Secondary | ICD-10-CM

## 2018-12-24 DIAGNOSIS — R531 Weakness: Secondary | ICD-10-CM

## 2018-12-24 DIAGNOSIS — E1159 Type 2 diabetes mellitus with other circulatory complications: Secondary | ICD-10-CM

## 2018-12-24 DIAGNOSIS — I509 Heart failure, unspecified: Secondary | ICD-10-CM

## 2018-12-24 DIAGNOSIS — F419 Anxiety disorder, unspecified: Secondary | ICD-10-CM

## 2018-12-24 DIAGNOSIS — K219 Gastro-esophageal reflux disease without esophagitis: Secondary | ICD-10-CM

## 2018-12-24 NOTE — Chronic Care Management (AMB) (Addendum)
  Care Management Note   Mary Raymond is a 68 y.o. year old female who is a primary care patient of [name], FNP. The CM team was consulted for assistance with chronic disease management and care coordination.   I reached out to [name] by phone today.   [name] was given information about Chronic Care Management services today including:  1. CCM service includes personalized support from designated clinical staff supervised by her physician, including individualized plan of care and coordination with other care providers 2. 24/7 contact phone numbers for assistance for urgent and routine care needs. 3. Service will only be billed when office clinical staff spend 20 minutes or more in a month to coordinate care. 4. Only one practitioner may furnish and bill the service in a calendar month. 5. The patient may stop CCM services at any time (effective at the end of the month) by phone call to the office staff. 6. The patient will be responsible for cost sharing (co-pay) of up to 20% of the service fee (after annual deductible is met). Patient did not agree to services and does not wish to consider at this time.   Review of patient status, including review of consultants reports, relevant laboratory and other test results, and collaboration with appropriate care team members and the patient's provider was performed as part of comprehensive patient evaluation and provision of chronic care management services.   Patient reported that she is residing at home of her daughter at present. Patient reported that she had her prescribed medications and said that her daughter, Mary Raymond, is caring for her. Client did not agree to CCM services at present but said she would consider these services perhaps at a future time  Follow Up Plan: Client to attend scheduled client medical appointments with [name], FNP.  [name].Tanieka Pownall MSW, LCSW Licensed Clinical Social Worker  [facility] Care Management [phone]  I have reviewed and agree with the above  documentation.   Evelina Dun, FNP

## 2018-12-24 NOTE — Patient Instructions (Signed)
Licensed Clinical Social Worker Visit Information  Ms. Ishii was given information about Chronic Care Management services today including:  1. CCM service includes personalized support from designated clinical staff supervised by her physician, including individualized plan of care and coordination with other care providers 2. 24/7 contact phone numbers for assistance for urgent and routine care needs. 3. Service will only be billed when office clinical staff spend 20 minutes or more in a month to coordinate care. 4. Only one practitioner may furnish and bill the service in a calendar month. 5. The patient may stop CCM services at any time (effective at the end of the month) by phone call to the office staff. 6. The patient will be responsible for cost sharing (co-pay) of up to 20% of the service fee (after annual deductible is met).  Patient did not agree to services and does not wish to consider at this time.   Materials Provided: No  Follow Up Plan: Client to attend scheduled client medical appointments with [name], FNP  The patient verbalized understanding of instructions provided today and declined a print copy of patient instruction materials.   [name].Koron Godeaux MSW, LCSW Licensed Clinical Social Worker [facility] Care Management [phone]

## 2018-12-25 ENCOUNTER — Ambulatory Visit: Payer: Medicare HMO | Admitting: Family

## 2018-12-25 ENCOUNTER — Encounter (HOSPITAL_COMMUNITY): Admission: EM | Disposition: E | Payer: Self-pay | Source: Home / Self Care | Attending: Pulmonary Disease

## 2018-12-25 ENCOUNTER — Inpatient Hospital Stay (HOSPITAL_COMMUNITY)
Admission: EM | Admit: 2018-12-25 | Discharge: 2019-01-16 | DRG: 250 | Disposition: E | Payer: Medicare HMO | Attending: Internal Medicine | Admitting: Internal Medicine

## 2018-12-25 ENCOUNTER — Inpatient Hospital Stay (HOSPITAL_COMMUNITY): Payer: Medicare HMO

## 2018-12-25 ENCOUNTER — Encounter (HOSPITAL_COMMUNITY): Payer: Self-pay

## 2018-12-25 ENCOUNTER — Other Ambulatory Visit: Payer: Self-pay

## 2018-12-25 ENCOUNTER — Emergency Department (HOSPITAL_COMMUNITY): Payer: Medicare HMO

## 2018-12-25 ENCOUNTER — Other Ambulatory Visit: Payer: Self-pay | Admitting: *Deleted

## 2018-12-25 ENCOUNTER — Ambulatory Visit: Payer: Self-pay | Admitting: *Deleted

## 2018-12-25 DIAGNOSIS — J45909 Unspecified asthma, uncomplicated: Secondary | ICD-10-CM | POA: Diagnosis present

## 2018-12-25 DIAGNOSIS — R0902 Hypoxemia: Secondary | ICD-10-CM

## 2018-12-25 DIAGNOSIS — G8929 Other chronic pain: Secondary | ICD-10-CM | POA: Diagnosis present

## 2018-12-25 DIAGNOSIS — K219 Gastro-esophageal reflux disease without esophagitis: Secondary | ICD-10-CM | POA: Diagnosis not present

## 2018-12-25 DIAGNOSIS — E782 Mixed hyperlipidemia: Secondary | ICD-10-CM | POA: Diagnosis present

## 2018-12-25 DIAGNOSIS — I213 ST elevation (STEMI) myocardial infarction of unspecified site: Secondary | ICD-10-CM | POA: Diagnosis not present

## 2018-12-25 DIAGNOSIS — N170 Acute kidney failure with tubular necrosis: Secondary | ICD-10-CM | POA: Diagnosis not present

## 2018-12-25 DIAGNOSIS — R918 Other nonspecific abnormal finding of lung field: Secondary | ICD-10-CM | POA: Diagnosis not present

## 2018-12-25 DIAGNOSIS — Z978 Presence of other specified devices: Secondary | ICD-10-CM | POA: Diagnosis present

## 2018-12-25 DIAGNOSIS — Y831 Surgical operation with implant of artificial internal device as the cause of abnormal reaction of the patient, or of later complication, without mention of misadventure at the time of the procedure: Secondary | ICD-10-CM | POA: Diagnosis present

## 2018-12-25 DIAGNOSIS — Z7982 Long term (current) use of aspirin: Secondary | ICD-10-CM

## 2018-12-25 DIAGNOSIS — N183 Chronic kidney disease, stage 3 (moderate): Secondary | ICD-10-CM

## 2018-12-25 DIAGNOSIS — R34 Anuria and oliguria: Secondary | ICD-10-CM | POA: Diagnosis not present

## 2018-12-25 DIAGNOSIS — Z66 Do not resuscitate: Secondary | ICD-10-CM | POA: Diagnosis not present

## 2018-12-25 DIAGNOSIS — M797 Fibromyalgia: Secondary | ICD-10-CM | POA: Diagnosis present

## 2018-12-25 DIAGNOSIS — Z515 Encounter for palliative care: Secondary | ICD-10-CM | POA: Diagnosis not present

## 2018-12-25 DIAGNOSIS — I2119 ST elevation (STEMI) myocardial infarction involving other coronary artery of inferior wall: Secondary | ICD-10-CM | POA: Diagnosis not present

## 2018-12-25 DIAGNOSIS — Z452 Encounter for adjustment and management of vascular access device: Secondary | ICD-10-CM

## 2018-12-25 DIAGNOSIS — I10 Essential (primary) hypertension: Secondary | ICD-10-CM | POA: Diagnosis not present

## 2018-12-25 DIAGNOSIS — I6340 Cerebral infarction due to embolism of unspecified cerebral artery: Secondary | ICD-10-CM | POA: Diagnosis not present

## 2018-12-25 DIAGNOSIS — J189 Pneumonia, unspecified organism: Secondary | ICD-10-CM

## 2018-12-25 DIAGNOSIS — E559 Vitamin D deficiency, unspecified: Secondary | ICD-10-CM | POA: Diagnosis present

## 2018-12-25 DIAGNOSIS — Z79890 Hormone replacement therapy (postmenopausal): Secondary | ICD-10-CM

## 2018-12-25 DIAGNOSIS — Z950 Presence of cardiac pacemaker: Secondary | ICD-10-CM

## 2018-12-25 DIAGNOSIS — E1142 Type 2 diabetes mellitus with diabetic polyneuropathy: Secondary | ICD-10-CM | POA: Diagnosis present

## 2018-12-25 DIAGNOSIS — N184 Chronic kidney disease, stage 4 (severe): Secondary | ICD-10-CM | POA: Diagnosis not present

## 2018-12-25 DIAGNOSIS — I130 Hypertensive heart and chronic kidney disease with heart failure and stage 1 through stage 4 chronic kidney disease, or unspecified chronic kidney disease: Secondary | ICD-10-CM | POA: Diagnosis present

## 2018-12-25 DIAGNOSIS — M1990 Unspecified osteoarthritis, unspecified site: Secondary | ICD-10-CM | POA: Diagnosis present

## 2018-12-25 DIAGNOSIS — J690 Pneumonitis due to inhalation of food and vomit: Secondary | ICD-10-CM | POA: Diagnosis not present

## 2018-12-25 DIAGNOSIS — I2111 ST elevation (STEMI) myocardial infarction involving right coronary artery: Secondary | ICD-10-CM

## 2018-12-25 DIAGNOSIS — T82867A Thrombosis of cardiac prosthetic devices, implants and grafts, initial encounter: Principal | ICD-10-CM | POA: Diagnosis present

## 2018-12-25 DIAGNOSIS — Z794 Long term (current) use of insulin: Secondary | ICD-10-CM

## 2018-12-25 DIAGNOSIS — M545 Low back pain: Secondary | ICD-10-CM | POA: Diagnosis present

## 2018-12-25 DIAGNOSIS — R680 Hypothermia, not associated with low environmental temperature: Secondary | ICD-10-CM | POA: Diagnosis not present

## 2018-12-25 DIAGNOSIS — Z6841 Body Mass Index (BMI) 40.0 and over, adult: Secondary | ICD-10-CM | POA: Diagnosis not present

## 2018-12-25 DIAGNOSIS — Z818 Family history of other mental and behavioral disorders: Secondary | ICD-10-CM

## 2018-12-25 DIAGNOSIS — Y718 Miscellaneous cardiovascular devices associated with adverse incidents, not elsewhere classified: Secondary | ICD-10-CM | POA: Diagnosis not present

## 2018-12-25 DIAGNOSIS — J95851 Ventilator associated pneumonia: Secondary | ICD-10-CM | POA: Diagnosis not present

## 2018-12-25 DIAGNOSIS — I25110 Atherosclerotic heart disease of native coronary artery with unstable angina pectoris: Secondary | ICD-10-CM | POA: Diagnosis not present

## 2018-12-25 DIAGNOSIS — F419 Anxiety disorder, unspecified: Secondary | ICD-10-CM | POA: Diagnosis present

## 2018-12-25 DIAGNOSIS — I5033 Acute on chronic diastolic (congestive) heart failure: Secondary | ICD-10-CM | POA: Diagnosis not present

## 2018-12-25 DIAGNOSIS — I639 Cerebral infarction, unspecified: Secondary | ICD-10-CM | POA: Diagnosis not present

## 2018-12-25 DIAGNOSIS — E872 Acidosis: Secondary | ICD-10-CM | POA: Diagnosis present

## 2018-12-25 DIAGNOSIS — Z8249 Family history of ischemic heart disease and other diseases of the circulatory system: Secondary | ICD-10-CM | POA: Diagnosis not present

## 2018-12-25 DIAGNOSIS — G92 Toxic encephalopathy: Secondary | ICD-10-CM | POA: Diagnosis not present

## 2018-12-25 DIAGNOSIS — I469 Cardiac arrest, cause unspecified: Secondary | ICD-10-CM | POA: Diagnosis not present

## 2018-12-25 DIAGNOSIS — I4901 Ventricular fibrillation: Secondary | ICD-10-CM | POA: Diagnosis present

## 2018-12-25 DIAGNOSIS — D649 Anemia, unspecified: Secondary | ICD-10-CM | POA: Diagnosis present

## 2018-12-25 DIAGNOSIS — Z888 Allergy status to other drugs, medicaments and biological substances status: Secondary | ICD-10-CM

## 2018-12-25 DIAGNOSIS — R0689 Other abnormalities of breathing: Secondary | ICD-10-CM | POA: Diagnosis not present

## 2018-12-25 DIAGNOSIS — I9589 Other hypotension: Secondary | ICD-10-CM | POA: Diagnosis not present

## 2018-12-25 DIAGNOSIS — I2584 Coronary atherosclerosis due to calcified coronary lesion: Secondary | ICD-10-CM | POA: Diagnosis present

## 2018-12-25 DIAGNOSIS — E039 Hypothyroidism, unspecified: Secondary | ICD-10-CM | POA: Diagnosis present

## 2018-12-25 DIAGNOSIS — E785 Hyperlipidemia, unspecified: Secondary | ICD-10-CM | POA: Diagnosis present

## 2018-12-25 DIAGNOSIS — Z7902 Long term (current) use of antithrombotics/antiplatelets: Secondary | ICD-10-CM

## 2018-12-25 DIAGNOSIS — Z9911 Dependence on respirator [ventilator] status: Secondary | ICD-10-CM | POA: Diagnosis not present

## 2018-12-25 DIAGNOSIS — R9401 Abnormal electroencephalogram [EEG]: Secondary | ICD-10-CM | POA: Diagnosis present

## 2018-12-25 DIAGNOSIS — I509 Heart failure, unspecified: Secondary | ICD-10-CM

## 2018-12-25 DIAGNOSIS — Z8679 Personal history of other diseases of the circulatory system: Secondary | ICD-10-CM

## 2018-12-25 DIAGNOSIS — I255 Ischemic cardiomyopathy: Secondary | ICD-10-CM | POA: Diagnosis present

## 2018-12-25 DIAGNOSIS — N179 Acute kidney failure, unspecified: Secondary | ICD-10-CM | POA: Diagnosis not present

## 2018-12-25 DIAGNOSIS — E7800 Pure hypercholesterolemia, unspecified: Secondary | ICD-10-CM | POA: Diagnosis not present

## 2018-12-25 DIAGNOSIS — R609 Edema, unspecified: Secondary | ICD-10-CM

## 2018-12-25 DIAGNOSIS — G40119 Localization-related (focal) (partial) symptomatic epilepsy and epileptic syndromes with simple partial seizures, intractable, without status epilepticus: Secondary | ICD-10-CM | POA: Diagnosis not present

## 2018-12-25 DIAGNOSIS — R0602 Shortness of breath: Secondary | ICD-10-CM

## 2018-12-25 DIAGNOSIS — Z79899 Other long term (current) drug therapy: Secondary | ICD-10-CM

## 2018-12-25 DIAGNOSIS — J9601 Acute respiratory failure with hypoxia: Secondary | ICD-10-CM | POA: Diagnosis present

## 2018-12-25 DIAGNOSIS — I447 Left bundle-branch block, unspecified: Secondary | ICD-10-CM | POA: Diagnosis present

## 2018-12-25 DIAGNOSIS — E669 Obesity, unspecified: Secondary | ICD-10-CM | POA: Diagnosis present

## 2018-12-25 DIAGNOSIS — I110 Hypertensive heart disease with heart failure: Secondary | ICD-10-CM | POA: Diagnosis not present

## 2018-12-25 DIAGNOSIS — Z90710 Acquired absence of both cervix and uterus: Secondary | ICD-10-CM

## 2018-12-25 DIAGNOSIS — R069 Unspecified abnormalities of breathing: Secondary | ICD-10-CM | POA: Diagnosis not present

## 2018-12-25 DIAGNOSIS — I2510 Atherosclerotic heart disease of native coronary artery without angina pectoris: Secondary | ICD-10-CM | POA: Diagnosis not present

## 2018-12-25 DIAGNOSIS — Z800 Family history of malignant neoplasm of digestive organs: Secondary | ICD-10-CM

## 2018-12-25 DIAGNOSIS — I2102 ST elevation (STEMI) myocardial infarction involving left anterior descending coronary artery: Secondary | ICD-10-CM | POA: Diagnosis not present

## 2018-12-25 DIAGNOSIS — T17908A Unspecified foreign body in respiratory tract, part unspecified causing other injury, initial encounter: Secondary | ICD-10-CM

## 2018-12-25 DIAGNOSIS — R570 Cardiogenic shock: Secondary | ICD-10-CM | POA: Diagnosis present

## 2018-12-25 DIAGNOSIS — I441 Atrioventricular block, second degree: Secondary | ICD-10-CM | POA: Diagnosis present

## 2018-12-25 DIAGNOSIS — R29700 NIHSS score 0: Secondary | ICD-10-CM | POA: Diagnosis not present

## 2018-12-25 DIAGNOSIS — G9340 Encephalopathy, unspecified: Secondary | ICD-10-CM | POA: Diagnosis not present

## 2018-12-25 DIAGNOSIS — Z825 Family history of asthma and other chronic lower respiratory diseases: Secondary | ICD-10-CM

## 2018-12-25 DIAGNOSIS — R569 Unspecified convulsions: Secondary | ICD-10-CM | POA: Diagnosis not present

## 2018-12-25 DIAGNOSIS — Z833 Family history of diabetes mellitus: Secondary | ICD-10-CM

## 2018-12-25 DIAGNOSIS — G931 Anoxic brain damage, not elsewhere classified: Secondary | ICD-10-CM | POA: Diagnosis not present

## 2018-12-25 DIAGNOSIS — Z8701 Personal history of pneumonia (recurrent): Secondary | ICD-10-CM

## 2018-12-25 DIAGNOSIS — Z803 Family history of malignant neoplasm of breast: Secondary | ICD-10-CM

## 2018-12-25 DIAGNOSIS — E1165 Type 2 diabetes mellitus with hyperglycemia: Secondary | ICD-10-CM | POA: Diagnosis present

## 2018-12-25 DIAGNOSIS — E1122 Type 2 diabetes mellitus with diabetic chronic kidney disease: Secondary | ICD-10-CM | POA: Diagnosis present

## 2018-12-25 DIAGNOSIS — I252 Old myocardial infarction: Secondary | ICD-10-CM

## 2018-12-25 DIAGNOSIS — Z8673 Personal history of transient ischemic attack (TIA), and cerebral infarction without residual deficits: Secondary | ICD-10-CM

## 2018-12-25 HISTORY — PX: CORONARY/GRAFT ACUTE MI REVASCULARIZATION: CATH118305

## 2018-12-25 HISTORY — DX: Unspecified diastolic (congestive) heart failure: I50.30

## 2018-12-25 HISTORY — DX: Atrioventricular block, second degree: I44.1

## 2018-12-25 HISTORY — DX: Chronic kidney disease, stage 3 (moderate): N18.3

## 2018-12-25 HISTORY — DX: Chronic kidney disease, stage 3 unspecified: N18.30

## 2018-12-25 HISTORY — DX: Non-ST elevation (NSTEMI) myocardial infarction: I21.4

## 2018-12-25 HISTORY — PX: LEFT HEART CATH AND CORONARY ANGIOGRAPHY: CATH118249

## 2018-12-25 LAB — COMPREHENSIVE METABOLIC PANEL
ALT: 14 U/L (ref 0–44)
AST: 18 U/L (ref 15–41)
Albumin: 3.4 g/dL — ABNORMAL LOW (ref 3.5–5.0)
Alkaline Phosphatase: 99 U/L (ref 38–126)
Anion gap: 14 (ref 5–15)
BUN: 56 mg/dL — ABNORMAL HIGH (ref 8–23)
CO2: 25 mmol/L (ref 22–32)
Calcium: 9.6 mg/dL (ref 8.9–10.3)
Chloride: 99 mmol/L (ref 98–111)
Creatinine, Ser: 1.68 mg/dL — ABNORMAL HIGH (ref 0.44–1.00)
GFR calc Af Amer: 36 mL/min — ABNORMAL LOW (ref >60)
GFR calc non Af Amer: 31 mL/min — ABNORMAL LOW (ref >60)
Glucose, Bld: 159 mg/dL — ABNORMAL HIGH (ref 70–99)
Potassium: 4.2 mmol/L (ref 3.5–5.1)
Sodium: 138 mmol/L (ref 135–145)
Total Bilirubin: 0.5 mg/dL (ref 0.3–1.2)
Total Protein: 7.5 g/dL (ref 6.5–8.1)

## 2018-12-25 LAB — POCT I-STAT 7, (LYTES, BLD GAS, ICA,H+H)
Bicarbonate: 24.7 mmol/L (ref 20.0–28.0)
Calcium, Ion: 1.21 mmol/L (ref 1.15–1.40)
HCT: 29 % — ABNORMAL LOW (ref 36.0–46.0)
Hemoglobin: 9.9 g/dL — ABNORMAL LOW (ref 12.0–15.0)
O2 Saturation: 100 %
Patient temperature: 98.6
Potassium: 4.8 mmol/L (ref 3.5–5.1)
Sodium: 135 mmol/L (ref 135–145)
TCO2: 26 mmol/L (ref 22–32)
pCO2 arterial: 41.2 mmHg (ref 32.0–48.0)
pH, Arterial: 7.386 (ref 7.350–7.450)
pO2, Arterial: 403 mmHg — ABNORMAL HIGH (ref 83.0–108.0)

## 2018-12-25 LAB — CBC WITH DIFFERENTIAL/PLATELET
Abs Immature Granulocytes: 0.11 10*3/uL — ABNORMAL HIGH (ref 0.00–0.07)
Basophils Absolute: 0 10*3/uL (ref 0.0–0.1)
Basophils Relative: 0 %
Eosinophils Absolute: 0.1 10*3/uL (ref 0.0–0.5)
Eosinophils Relative: 1 %
HCT: 31.3 % — ABNORMAL LOW (ref 36.0–46.0)
Hemoglobin: 9.4 g/dL — ABNORMAL LOW (ref 12.0–15.0)
Immature Granulocytes: 1 %
Lymphocytes Relative: 22 %
Lymphs Abs: 2.5 10*3/uL (ref 0.7–4.0)
MCH: 25.3 pg — ABNORMAL LOW (ref 26.0–34.0)
MCHC: 30 g/dL (ref 30.0–36.0)
MCV: 84.4 fL (ref 80.0–100.0)
Monocytes Absolute: 0.7 10*3/uL (ref 0.1–1.0)
Monocytes Relative: 6 %
Neutro Abs: 7.9 10*3/uL — ABNORMAL HIGH (ref 1.7–7.7)
Neutrophils Relative %: 70 %
Platelets: 237 10*3/uL (ref 150–400)
RBC: 3.71 MIL/uL — ABNORMAL LOW (ref 3.87–5.11)
RDW: 16.7 % — ABNORMAL HIGH (ref 11.5–15.5)
WBC: 11.3 10*3/uL — ABNORMAL HIGH (ref 4.0–10.5)
nRBC: 0 % (ref 0.0–0.2)

## 2018-12-25 LAB — URINALYSIS, ROUTINE W REFLEX MICROSCOPIC
Bilirubin Urine: NEGATIVE
Glucose, UA: NEGATIVE mg/dL
Hgb urine dipstick: NEGATIVE
Ketones, ur: NEGATIVE mg/dL
Leukocytes,Ua: NEGATIVE
Nitrite: NEGATIVE
Protein, ur: 100 mg/dL — AB
Specific Gravity, Urine: 1.014 (ref 1.005–1.030)
pH: 7 (ref 5.0–8.0)

## 2018-12-25 LAB — BASIC METABOLIC PANEL
Anion gap: 19 — ABNORMAL HIGH (ref 5–15)
Anion gap: 20 — ABNORMAL HIGH (ref 5–15)
BUN: 56 mg/dL — ABNORMAL HIGH (ref 8–23)
BUN: 57 mg/dL — ABNORMAL HIGH (ref 8–23)
CO2: 20 mmol/L — ABNORMAL LOW (ref 22–32)
CO2: 18 mmol/L — ABNORMAL LOW (ref 22–32)
Calcium: 9.1 mg/dL (ref 8.9–10.3)
Calcium: 9.1 mg/dL (ref 8.9–10.3)
Chloride: 96 mmol/L — ABNORMAL LOW (ref 98–111)
Chloride: 97 mmol/L — ABNORMAL LOW (ref 98–111)
Creatinine, Ser: 2.24 mg/dL — ABNORMAL HIGH (ref 0.44–1.00)
Creatinine, Ser: 2.25 mg/dL — ABNORMAL HIGH (ref 0.44–1.00)
GFR calc Af Amer: 25 mL/min — ABNORMAL LOW (ref >60)
GFR calc Af Amer: 25 mL/min — ABNORMAL LOW (ref >60)
GFR calc non Af Amer: 22 mL/min — ABNORMAL LOW (ref >60)
GFR calc non Af Amer: 22 mL/min — ABNORMAL LOW (ref >60)
Glucose, Bld: 408 mg/dL — ABNORMAL HIGH (ref 70–99)
Glucose, Bld: 448 mg/dL — ABNORMAL HIGH (ref 70–99)
Potassium: 4.9 mmol/L (ref 3.5–5.1)
Potassium: 4.6 mmol/L (ref 3.5–5.1)
Sodium: 135 mmol/L (ref 135–145)
Sodium: 135 mmol/L (ref 135–145)

## 2018-12-25 LAB — CBC
HCT: 32.2 % — ABNORMAL LOW (ref 36.0–46.0)
Hemoglobin: 9.6 g/dL — ABNORMAL LOW (ref 12.0–15.0)
MCH: 25 pg — ABNORMAL LOW (ref 26.0–34.0)
MCHC: 29.8 g/dL — ABNORMAL LOW (ref 30.0–36.0)
MCV: 83.9 fL (ref 80.0–100.0)
Platelets: 350 10*3/uL (ref 150–400)
RBC: 3.84 MIL/uL — ABNORMAL LOW (ref 3.87–5.11)
RDW: 16.9 % — ABNORMAL HIGH (ref 11.5–15.5)
WBC: 31 10*3/uL — ABNORMAL HIGH (ref 4.0–10.5)
nRBC: 0 % (ref 0.0–0.2)

## 2018-12-25 LAB — BRAIN NATRIURETIC PEPTIDE: B Natriuretic Peptide: 145 pg/mL — ABNORMAL HIGH (ref 0.0–100.0)

## 2018-12-25 LAB — PROTIME-INR
INR: 1.3 — ABNORMAL HIGH (ref 0.8–1.2)
Prothrombin Time: 15.5 seconds — ABNORMAL HIGH (ref 11.4–15.2)

## 2018-12-25 LAB — TROPONIN I
Troponin I: 0.12 ng/mL (ref <0.03)
Troponin I: 0.11 ng/mL (ref <0.03)
Troponin I: 1.16 ng/mL (ref <0.03)

## 2018-12-25 LAB — APTT: aPTT: 196 seconds (ref 24–36)

## 2018-12-25 LAB — POCT ACTIVATED CLOTTING TIME
Activated Clotting Time: 312 seconds
Activated Clotting Time: 318 seconds

## 2018-12-25 LAB — TRIGLYCERIDES: Triglycerides: 153 mg/dL — ABNORMAL HIGH (ref <150)

## 2018-12-25 LAB — PHOSPHORUS: Phosphorus: 6.2 mg/dL — ABNORMAL HIGH (ref 2.5–4.6)

## 2018-12-25 LAB — MAGNESIUM: Magnesium: 1.9 mg/dL (ref 1.7–2.4)

## 2018-12-25 SURGERY — CORONARY/GRAFT ACUTE MI REVASCULARIZATION
Anesthesia: LOCAL

## 2018-12-25 MED ORDER — SODIUM CHLORIDE 0.9 % IV SOLN: 250.0000 mL | INTRAVENOUS | Status: DC | PRN

## 2018-12-25 MED ORDER — AMIODARONE HCL 150 MG/3ML IV SOLN
300.0000 mg | Freq: Once | INTRAVENOUS | Status: AC
  Administered 2018-12-25: 300 mg via INTRAVENOUS

## 2018-12-25 MED ORDER — HEPARIN SODIUM (PORCINE) 1000 UNIT/ML IJ SOLN
INTRAMUSCULAR | Status: AC
  Filled 2018-12-25: qty 1

## 2018-12-25 MED ORDER — MIDAZOLAM HCL 2 MG/2ML IJ SOLN
1.0000 mg | INTRAMUSCULAR | Status: DC | PRN
  Filled 2018-12-25: qty 2

## 2018-12-25 MED ORDER — TIROFIBAN HCL IN NACL 5-0.9 MG/100ML-% IV SOLN
INTRAVENOUS | Status: AC
  Filled 2018-12-25: qty 100

## 2018-12-25 MED ORDER — TICAGRELOR 90 MG PO TABS
180.0000 mg | ORAL_TABLET | Freq: Once | ORAL | Status: AC
  Administered 2018-12-25: 180 mg via ORAL
  Filled 2018-12-25: qty 2

## 2018-12-25 MED ORDER — AMIODARONE HCL IN DEXTROSE 360-4.14 MG/200ML-% IV SOLN
INTRAVENOUS | Status: AC
  Filled 2018-12-25: qty 200

## 2018-12-25 MED ORDER — TIROFIBAN HCL IN NACL 5-0.9 MG/100ML-% IV SOLN
INTRAVENOUS | Status: AC | PRN
  Administered 2018-12-25: 0.075 ug/kg/min via INTRAVENOUS

## 2018-12-25 MED ORDER — IOHEXOL 350 MG/ML SOLN
INTRAVENOUS | Status: DC | PRN
  Administered 2018-12-25: 120 mL via INTRAVENOUS

## 2018-12-25 MED ORDER — FENTANYL CITRATE (PF) 100 MCG/2ML IJ SOLN
50.0000 ug | INTRAMUSCULAR | Status: DC | PRN
  Administered 2018-12-25: 50 ug via INTRAVENOUS
  Filled 2018-12-25: qty 2

## 2018-12-25 MED ORDER — NITROGLYCERIN 1 MG/10 ML FOR IR/CATH LAB
INTRA_ARTERIAL | Status: AC
  Filled 2018-12-25: qty 10

## 2018-12-25 MED ORDER — HEPARIN (PORCINE) IN NACL 1000-0.9 UT/500ML-% IV SOLN
INTRAVENOUS | Status: DC | PRN
  Administered 2018-12-25 (×2): 500 mL

## 2018-12-25 MED ORDER — ORAL CARE MOUTH RINSE
15.0000 mL | OROMUCOSAL | Status: DC
  Administered 2018-12-25 – 2019-01-03 (×88): 15 mL via OROMUCOSAL

## 2018-12-25 MED ORDER — AMIODARONE HCL IN DEXTROSE 360-4.14 MG/200ML-% IV SOLN
60.0000 mg/h | INTRAVENOUS | Status: AC
  Administered 2018-12-25: 60 mg/h via INTRAVENOUS
  Filled 2018-12-25: qty 200

## 2018-12-25 MED ORDER — TICAGRELOR 90 MG PO TABS
90.0000 mg | ORAL_TABLET | Freq: Two times a day (BID) | ORAL | Status: DC
  Administered 2018-12-26 – 2019-01-03 (×17): 90 mg via ORAL
  Filled 2018-12-25 (×17): qty 1

## 2018-12-25 MED ORDER — LIDOCAINE HCL (PF) 1 % IJ SOLN
INTRAMUSCULAR | Status: AC
  Filled 2018-12-25: qty 30

## 2018-12-25 MED ORDER — INSULIN REGULAR(HUMAN) IN NACL 100-0.9 UT/100ML-% IV SOLN
INTRAVENOUS | Status: DC
  Administered 2018-12-25: 3.5 [IU]/h via INTRAVENOUS
  Administered 2018-12-26: 12.4 [IU]/h via INTRAVENOUS
  Filled 2018-12-25 (×2): qty 100

## 2018-12-25 MED ORDER — HEPARIN SODIUM (PORCINE) 5000 UNIT/ML IJ SOLN
5000.0000 [IU] | Freq: Three times a day (TID) | INTRAMUSCULAR | Status: DC
  Administered 2018-12-26 – 2019-01-03 (×26): 5000 [IU] via SUBCUTANEOUS
  Filled 2018-12-25 (×26): qty 1

## 2018-12-25 MED ORDER — FENTANYL 2500MCG IN NS 250ML (10MCG/ML) PREMIX INFUSION
100.0000 ug/h | INTRAVENOUS | Status: DC
  Administered 2018-12-26: 300 ug/h via INTRAVENOUS
  Administered 2018-12-26: 250 ug/h via INTRAVENOUS
  Administered 2018-12-26: 300 ug/h via INTRAVENOUS
  Administered 2018-12-27: 175 ug/h via INTRAVENOUS
  Administered 2018-12-27: 300 ug/h via INTRAVENOUS
  Administered 2018-12-28: 175 ug/h via INTRAVENOUS
  Filled 2018-12-25 (×6): qty 250

## 2018-12-25 MED ORDER — FENTANYL CITRATE (PF) 100 MCG/2ML IJ SOLN: 50.0000 ug | Freq: Once | INTRAMUSCULAR | Status: DC

## 2018-12-25 MED ORDER — VERAPAMIL HCL 2.5 MG/ML IV SOLN
INTRAVENOUS | Status: DC | PRN
  Administered 2018-12-25: 10 mL via INTRA_ARTERIAL

## 2018-12-25 MED ORDER — HEPARIN SODIUM (PORCINE) 5000 UNIT/ML IJ SOLN
4000.0000 [IU] | Freq: Once | INTRAMUSCULAR | Status: AC
  Administered 2018-12-25: 4000 [IU] via INTRAVENOUS
  Filled 2018-12-25: qty 1

## 2018-12-25 MED ORDER — HEPARIN SODIUM (PORCINE) 5000 UNIT/ML IJ SOLN: 5000.0000 [IU] | Freq: Three times a day (TID) | INTRAMUSCULAR | Status: DC

## 2018-12-25 MED ORDER — ARTIFICIAL TEARS OPHTHALMIC OINT
1.0000 "application " | TOPICAL_OINTMENT | Freq: Three times a day (TID) | OPHTHALMIC | Status: DC
  Administered 2018-12-25 – 2018-12-27 (×6): 1 via OPHTHALMIC
  Filled 2018-12-25: qty 3.5

## 2018-12-25 MED ORDER — VERAPAMIL HCL 2.5 MG/ML IV SOLN
INTRAVENOUS | Status: AC
  Filled 2018-12-25: qty 2

## 2018-12-25 MED ORDER — SODIUM CHLORIDE 0.9 % IV SOLN
INTRAVENOUS | Status: AC
  Administered 2018-12-26: via INTRAVENOUS

## 2018-12-25 MED ORDER — SODIUM CHLORIDE 0.9 % IV SOLN
INTRAVENOUS | Status: DC
  Administered 2018-12-26: 02:00:00 via INTRAVENOUS
  Administered 2018-12-27: 10 mL/h via INTRAVENOUS
  Administered 2018-12-28 – 2018-12-30 (×3): via INTRAVENOUS

## 2018-12-25 MED ORDER — EPINEPHRINE 0.3 MG/0.3ML IJ SOAJ: 0.3000 mg | Freq: Once | INTRAMUSCULAR | Status: DC

## 2018-12-25 MED ORDER — MIDAZOLAM HCL 2 MG/2ML IJ SOLN
INTRAMUSCULAR | Status: AC
  Filled 2018-12-25: qty 2

## 2018-12-25 MED ORDER — HEPARIN SODIUM (PORCINE) 1000 UNIT/ML IJ SOLN
INTRAMUSCULAR | Status: AC
  Filled 2018-12-25: qty 2

## 2018-12-25 MED ORDER — HEPARIN (PORCINE) IN NACL 1000-0.9 UT/500ML-% IV SOLN
INTRAVENOUS | Status: AC
  Filled 2018-12-25: qty 1500

## 2018-12-25 MED ORDER — TIROFIBAN (AGGRASTAT) BOLUS VIA INFUSION
INTRAVENOUS | Status: DC | PRN
  Administered 2018-12-25: 18:00:00 3007.5 ug via INTRAVENOUS

## 2018-12-25 MED ORDER — MIDAZOLAM HCL 2 MG/2ML IJ SOLN
1.0000 mg | INTRAMUSCULAR | Status: DC | PRN
  Administered 2018-12-25 (×2): 1 mg via INTRAVENOUS
  Filled 2018-12-25: qty 2

## 2018-12-25 MED ORDER — FENTANYL BOLUS VIA INFUSION
25.0000 ug | INTRAVENOUS | Status: DC | PRN
  Filled 2018-12-25: qty 25

## 2018-12-25 MED ORDER — ONDANSETRON HCL 4 MG/2ML IJ SOLN
4.0000 mg | Freq: Four times a day (QID) | INTRAMUSCULAR | Status: DC | PRN
  Administered 2018-12-28: 06:00:00 4 mg via INTRAVENOUS
  Filled 2018-12-25: qty 2

## 2018-12-25 MED ORDER — FENTANYL 2500MCG IN NS 250ML (10MCG/ML) PREMIX INFUSION
25.0000 ug/h | INTRAVENOUS | Status: DC
  Administered 2018-12-25: 50 ug/h via INTRAVENOUS
  Filled 2018-12-25: qty 250

## 2018-12-25 MED ORDER — IPRATROPIUM-ALBUTEROL 0.5-2.5 (3) MG/3ML IN SOLN
3.0000 mL | Freq: Four times a day (QID) | RESPIRATORY_TRACT | Status: DC | PRN
  Filled 2018-12-25: qty 3

## 2018-12-25 MED ORDER — SODIUM CHLORIDE 0.9 % IV SOLN: INTRAVENOUS | Status: DC | PRN

## 2018-12-25 MED ORDER — FENTANYL CITRATE (PF) 100 MCG/2ML IJ SOLN
INTRAMUSCULAR | Status: AC
  Filled 2018-12-25: qty 2

## 2018-12-25 MED ORDER — TIROFIBAN HCL IN NACL 5-0.9 MG/100ML-% IV SOLN: 0.0750 ug/kg/min | INTRAVENOUS | Status: AC

## 2018-12-25 MED ORDER — FUROSEMIDE 10 MG/ML IJ SOLN
40.0000 mg | Freq: Once | INTRAMUSCULAR | Status: AC
  Administered 2018-12-25: 40 mg via INTRAVENOUS
  Filled 2018-12-25: qty 4

## 2018-12-25 MED ORDER — CHLORHEXIDINE GLUCONATE 0.12% ORAL RINSE (MEDLINE KIT)
15.0000 mL | Freq: Two times a day (BID) | OROMUCOSAL | Status: DC
  Administered 2018-12-25 – 2019-01-03 (×18): 15 mL via OROMUCOSAL

## 2018-12-25 MED ORDER — LABETALOL HCL 5 MG/ML IV SOLN: 10.0000 mg | INTRAVENOUS | Status: AC | PRN

## 2018-12-25 MED ORDER — AMIODARONE HCL IN DEXTROSE 360-4.14 MG/200ML-% IV SOLN
30.0000 mg/h | INTRAVENOUS | Status: DC
  Administered 2018-12-25: 21:00:00 59.94 mg/h via INTRAVENOUS
  Administered 2018-12-26 – 2018-12-28 (×5): 30 mg/h via INTRAVENOUS
  Filled 2018-12-25 (×5): qty 200

## 2018-12-25 MED ORDER — ASPIRIN 300 MG RE SUPP
300.0000 mg | RECTAL | Status: AC
  Administered 2018-12-25: 300 mg via RECTAL
  Filled 2018-12-25 (×2): qty 1

## 2018-12-25 MED ORDER — HEPARIN (PORCINE) IN NACL 1000-0.9 UT/500ML-% IV SOLN
INTRAVENOUS | Status: AC
  Filled 2018-12-25: qty 1000

## 2018-12-25 MED ORDER — FENTANYL CITRATE (PF) 100 MCG/2ML IJ SOLN: 50.0000 ug | INTRAMUSCULAR | Status: DC | PRN

## 2018-12-25 MED ORDER — SODIUM CHLORIDE 0.9 % IV SOLN
1.0000 ug/kg/min | INTRAVENOUS | Status: DC
  Administered 2018-12-25: 1 ug/kg/min via INTRAVENOUS
  Administered 2018-12-26 – 2018-12-27 (×2): 1.5 ug/kg/min via INTRAVENOUS
  Filled 2018-12-25 (×3): qty 20

## 2018-12-25 MED ORDER — HEPARIN SODIUM (PORCINE) 1000 UNIT/ML IJ SOLN
INTRAMUSCULAR | Status: DC | PRN
  Administered 2018-12-25 (×2): 5000 [IU] via INTRAVENOUS

## 2018-12-25 MED ORDER — CISATRACURIUM BOLUS VIA INFUSION
0.1000 mg/kg | Freq: Once | INTRAVENOUS | Status: AC
  Administered 2018-12-25: 12 mg via INTRAVENOUS
  Filled 2018-12-25: qty 12

## 2018-12-25 MED ORDER — EPINEPHRINE PF 1 MG/ML IJ SOLN: 1.0000 mg | Freq: Once | INTRAMUSCULAR | Status: DC

## 2018-12-25 MED ORDER — HEPARIN (PORCINE) 25000 UT/250ML-% IV SOLN: 1200.0000 [IU]/h | INTRAVENOUS | Status: DC

## 2018-12-25 MED ORDER — CISATRACURIUM BOLUS VIA INFUSION
0.0500 mg/kg | INTRAVENOUS | Status: DC | PRN
  Filled 2018-12-25: qty 6

## 2018-12-25 MED ORDER — HYDRALAZINE HCL 20 MG/ML IJ SOLN: 10.0000 mg | INTRAMUSCULAR | Status: AC | PRN

## 2018-12-25 MED ORDER — ACETAMINOPHEN 325 MG PO TABS
650.0000 mg | ORAL_TABLET | ORAL | Status: DC | PRN
  Administered 2018-12-27: 650 mg via ORAL
  Filled 2018-12-25: qty 2

## 2018-12-25 MED ORDER — LEVOTHYROXINE SODIUM 100 MCG/5ML IV SOLN
25.0000 ug | Freq: Every day | INTRAVENOUS | Status: DC
  Administered 2018-12-26 – 2018-12-30 (×5): 25 ug via INTRAVENOUS
  Filled 2018-12-25 (×5): qty 5

## 2018-12-25 MED ORDER — LIDOCAINE HCL (PF) 1 % IJ SOLN
INTRAMUSCULAR | Status: DC | PRN
  Administered 2018-12-25: 16 mL via INTRADERMAL

## 2018-12-25 MED ORDER — SODIUM CHLORIDE 0.9% FLUSH
3.0000 mL | Freq: Two times a day (BID) | INTRAVENOUS | Status: DC
  Administered 2018-12-26 – 2018-12-27 (×3): 3 mL via INTRAVENOUS

## 2018-12-25 MED ORDER — EPINEPHRINE PF 1 MG/ML IJ SOLN
1.0000 mg | Freq: Once | INTRAMUSCULAR | Status: AC
  Administered 2018-12-25: 16:00:00 1 mg via INTRAVENOUS

## 2018-12-25 MED ORDER — SODIUM CHLORIDE 0.9% FLUSH: 3.0000 mL | INTRAVENOUS | Status: DC | PRN

## 2018-12-25 MED ORDER — ASPIRIN EC 81 MG PO TBEC
81.0000 mg | DELAYED_RELEASE_TABLET | Freq: Every day | ORAL | Status: DC
  Administered 2018-12-26: 81 mg via ORAL
  Filled 2018-12-25: qty 1

## 2018-12-25 MED ORDER — PROPOFOL 1000 MG/100ML IV EMUL
5.0000 ug/kg/min | INTRAVENOUS | Status: DC
  Administered 2018-12-25: 5 ug/kg/min via INTRAVENOUS
  Administered 2018-12-25: 60 ug/kg/min via INTRAVENOUS
  Administered 2018-12-26: 70 ug/kg/min via INTRAVENOUS
  Administered 2018-12-26: 80 ug/kg/min via INTRAVENOUS
  Administered 2018-12-26: 11:00:00 60 ug/kg/min via INTRAVENOUS
  Administered 2018-12-26: 27.709 ug/kg/min via INTRAVENOUS
  Administered 2018-12-26: 60 ug/kg/min via INTRAVENOUS
  Administered 2018-12-26: 80 ug/kg/min via INTRAVENOUS
  Administered 2018-12-26 (×4): 60 ug/kg/min via INTRAVENOUS
  Administered 2018-12-26: 27.709 ug/kg/min via INTRAVENOUS
  Administered 2018-12-27 (×8): 60 ug/kg/min via INTRAVENOUS
  Filled 2018-12-25: qty 100
  Filled 2018-12-25: qty 200
  Filled 2018-12-25 (×2): qty 100
  Filled 2018-12-25: qty 200
  Filled 2018-12-25 (×2): qty 100
  Filled 2018-12-25: qty 200
  Filled 2018-12-25 (×4): qty 100
  Filled 2018-12-25: qty 200
  Filled 2018-12-25: qty 100
  Filled 2018-12-25: qty 200

## 2018-12-25 MED ORDER — HEPARIN BOLUS VIA INFUSION: 4000.0000 [IU] | Freq: Once | INTRAVENOUS | Status: DC

## 2018-12-25 MED ORDER — NOREPINEPHRINE 4 MG/250ML-% IV SOLN
0.0000 ug/min | INTRAVENOUS | Status: DC
  Administered 2018-12-25: 5 ug/min via INTRAVENOUS
  Administered 2018-12-26: 4 ug/min via INTRAVENOUS
  Administered 2018-12-27: 12 ug/min via INTRAVENOUS
  Administered 2018-12-27: 8 ug/min via INTRAVENOUS
  Administered 2018-12-28: 10 ug/min via INTRAVENOUS
  Administered 2018-12-28: 30 ug/min via INTRAVENOUS
  Filled 2018-12-25 (×6): qty 250

## 2018-12-25 MED ORDER — PROPOFOL 1000 MG/100ML IV EMUL: 25.0000 ug/kg/min | INTRAVENOUS | Status: DC

## 2018-12-25 SURGICAL SUPPLY — 19 items
BALLN SAPPHIRE 4.0X15 (BALLOONS) ×2
BALLN ~~LOC~~ EMERGE MR 4.0X15 (BALLOONS) ×2
BALLOON SAPPHIRE 4.0X15 (BALLOONS) ×1 IMPLANT
BALLOON ~~LOC~~ EMERGE MR 4.0X15 (BALLOONS) ×1 IMPLANT
CATH 5FR JL3.5 JR4 ANG PIG MP (CATHETERS) ×2 IMPLANT
CATH VISTA GUIDE 6FR JR4 (CATHETERS) ×2 IMPLANT
DEVICE RAD COMP TR BAND LRG (VASCULAR PRODUCTS) ×2 IMPLANT
GLIDESHEATH SLEND A-KIT 6F 22G (SHEATH) ×2 IMPLANT
GUIDEWIRE INQWIRE 1.5J.035X260 (WIRE) ×1 IMPLANT
HOVERMATT SINGLE USE (MISCELLANEOUS) ×2 IMPLANT
INQWIRE 1.5J .035X260CM (WIRE) ×2
KIT ENCORE 26 ADVANTAGE (KITS) ×2 IMPLANT
KIT HEART LEFT (KITS) ×2 IMPLANT
PACK CARDIAC CATHETERIZATION (CUSTOM PROCEDURE TRAY) ×2 IMPLANT
SHEATH PINNACLE 6F 10CM (SHEATH) ×2 IMPLANT
SHEATH PROBE COVER 6X72 (BAG) ×2 IMPLANT
TRANSDUCER W/STOPCOCK (MISCELLANEOUS) ×2 IMPLANT
TUBING CIL FLEX 10 FLL-RA (TUBING) ×2 IMPLANT
WIRE ASAHI PROWATER 180CM (WIRE) ×2 IMPLANT

## 2018-12-25 NOTE — ED Triage Notes (Signed)
Pt brought in by EMS due to Sob since yesterday. Worsening over the night. sats upon EMS arrival 97%. Pt denies any pain. Noted to have swelling in lower extremities. Pt reports she was discharged from hospital 10 days ago for same problem

## 2018-12-25 NOTE — ED Notes (Signed)
Date and time results received: 12/20/2018 0826 (use smartphrase ".now" to insert current time)  Test: troponin Critical Value: 0.12  Name of Provider Notified: Dr. Thurnell Garbe at [phone]  Orders Received? Or Actions Taken?: no/na

## 2018-12-25 NOTE — ED Notes (Signed)
Pt assisted to BSC and back to bed 
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
## 2018-12-25 NOTE — ED Provider Notes (Signed)
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 visibly with agonal resps. CPR in progress with BVM O2. Unable to intubate pt in initial exam room so pt moved to larger exam room/resuscitation room.   1545:  CPR with BVM in progress. IV epi x1 dose given. ETT placed without difficulty. CPR held for pulse check. +VF on monitor; defib performed. CPR continued. Monitor NSR, +central pulses. Agonal resps over vent.   1555:  Pt in and out of VF x3, responded x3 (NSR) to each defib attempt, and IV amiodarone bolus. IV amiodarone gtt to follow. Monitor paced via pt's own pacemaker, NSR, +central pulses. Agonal resps over vent. Monitor then appeared to have STE in inferior leads. EKG #2 completed; Code STEMI called.   1555:  No Cards MD at [facility] currently (office closed and staff in [place] office). T/C returned from [place] Dr. Ander Slade, case discussed, including:  HPI, pertinent PM/SHx, VS/PE, dx testing, ED course and treatment:  Agrees pt appeared stable/labs unremarkable, pt will need ICU care after Cards MD cath lab evaluation.   1605:  T/C returned from [facility] STEMI Dr. Tamala Julian, case discussed, including:  HPI, pertinent PM/SHx, VS/PE, dx testing, ED course  and treatment:  Agrees labs stable, 1st EKG without concerning features, pt does have hx significant CAD/blockages and has stent in place, pt's 2nd EKG appears to be +STEMI, pt needs to be transported to cath lab for emergent cath (?possible clot in RCA stent). Conference call with PCCM MD and STEMI MD: IV heparin bolus to be given as well as IVP fentanyl/versed for sedation.   1625:  VS remain stable. Pt restless on vent; meds given with improvement. Pt's daughter at bedside and updated. [facility] here for transport.         Final Clinical Impressions(s) / ED Diagnoses   Final diagnoses:  SOB (shortness of breath)    ED Discharge Orders    None       Francine Graven, DO 12/29/18 1303

## 2018-12-25 NOTE — ED Notes (Signed)
Shock delivered at 200 j. Pt in vfib. Compressions resumed

## 2018-12-25 NOTE — ED Notes (Addendum)
No pulse  Shock delivered at 120. Remains V fib. CPR resumed

## 2018-12-25 NOTE — ED Notes (Signed)
Pt in V fib. Shock delivered at 120

## 2018-12-25 NOTE — ED Notes (Signed)
Report given to [facility], Cath Lab at [facility].

## 2018-12-25 NOTE — ED Notes (Signed)
[facility] here for transport.  

## 2018-12-25 NOTE — Procedures (Signed)
Intubation Procedure Note Mary Raymond [id number] Jan 03, 1951  Procedure: Intubation Indications: ETT needed to be changed  Procedure Details Consent: Unable to obtain consent because of emergent medical necessity. Time Out: Verified patient identification, verified procedure, site/side was marked, verified correct patient position, special equipment/implants available, medications/allergies/relevent history reviewed, required imaging and test results available.  Performed  Maximum sterile technique was used including cap, gloves, hand hygiene and mask.   Performed with RT at bedside. RN in room Started pt on non invasive oxygen via nasal cannula @ 10 L Cuff on new endotracheal tube checked and competent Using Bougie advanced into existing ETT- removed old incompetent ETT with ease. Advanced new ETT to 26cm at the lip- removed bougie inflated cuff and attached to mechanical ventilaion Excellent volumes. ETT secured and Cuff pressure checked by RT Removed nasal cannula.  Evaluation Hemodynamic Status: BP stable throughout; O2 sats: stable throughout Patient's Current Condition: stable Complications: No apparent complications Patient did tolerate procedure well. Chest X-ray ordered to verify placement.  CXR: pending.   Mary Raymond Mary Raymond 12/18/2018

## 2018-12-25 NOTE — ED Notes (Addendum)
Nurse went in room to discharge pt Pt sitting onBSC with head back. Unresponsive. No pulse, no respirations. Pt placed in bed and CPR initiated

## 2018-12-25 NOTE — ED Notes (Signed)
Spoke with [name], told her I was not sure of patient disposition at this time.

## 2018-12-25 NOTE — ED Notes (Signed)
Patient ambulated in hallway.  o2 sat remained at 98%.  Patient stated "she did not feel as short of breath as before, just weak".

## 2018-12-25 NOTE — Procedures (Signed)
Central Venous Catheter Insertion Procedure Note Mary Raymond [id number] [birth year]  Procedure: Insertion of Central Venous Catheter Indications: Assessment of intravascular volume, Drug and/or fluid administration and Frequent blood sampling  Procedure Details Consent: Unable to obtain consent because of emergent medical necessity. Time Out: Verified patient identification, verified procedure, site/side was marked, verified correct patient position, special equipment/implants available, medications/allergies/relevent history reviewed, required imaging and test results available.  Performed  Maximum sterile technique was used including antiseptics, cap, gloves, gown, hand hygiene, mask and sheet. Skin prep: Chlorhexidine; local anesthetic administered A antimicrobial bonded/coated triple lumen catheter was placed in the right femoral vein > re-wire of current venous sheath.   Evaluation Blood flow good Complications: No apparent complications Patient did tolerate procedure well.  Hayden Pedro, AGACNP-BC [facility]  PCCM Pgr: [phone]
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
## 2018-12-25 NOTE — Progress Notes (Signed)
EEG Complete  Results Pending 

## 2018-12-25 NOTE — ED Notes (Signed)
Pt ambulated with a walker outside her room but unable to walk any further; pt c/o extreme sob and pain to right shoulder and back; O2 sats stayed at 98%; Dr. Thurnell Garbe notified

## 2018-12-25 NOTE — Plan of Care (Signed)
  Problem: Cardiovascular: Goal: Vascular access site(s) Level 0-1 will be maintained Outcome: Progressing   Problem: Skin Integrity: Goal: Risk for impaired skin integrity will be minimized. Outcome: Progressing   Problem: Education: Goal: Understanding of CV disease, CV risk reduction, and recovery process will improve Outcome: Not Progressing Goal: Individualized Educational Video(s) Outcome: Not Progressing   Problem: Activity: Goal: Ability to return to baseline activity level will improve Outcome: Not Progressing   Problem: Cardiovascular: Goal: Ability to achieve and maintain adequate cardiovascular perfusion will improve Outcome: Not Progressing   Problem: Health Behavior/Discharge Planning: Goal: Ability to safely manage health-related needs after discharge will improve Outcome: Not Progressing   Problem: Education: Goal: Ability to manage disease process will improve Outcome: Not Progressing   Problem: Cardiac: Goal: Ability to achieve and maintain adequate cardiopulmonary perfusion will improve Outcome: Not Progressing   Problem: Neurologic: Goal: Promote progressive neurologic recovery Outcome: Not Progressing

## 2018-12-25 NOTE — Progress Notes (Signed)
CRITICAL VALUE ALERT  Critical Value:  PTT 196  Date & Time Notied:  Today, now   Provider Notified: elink RN  Orders Received/Actions taken: pending

## 2018-12-25 NOTE — ED Notes (Signed)
Pt assisted to side of bed for weight; pt repositioned in bed

## 2018-12-25 NOTE — Progress Notes (Signed)
Patient endotracheal tube had a blown cuff, a three way stop cock was placed to minimize the leak. Patient was getting tidal volumes with stop cock. Desision was made to replace the endotracheal tube per CCM. Endotracheal tube was replaced with #7.0 and secured at 26cm at the lip, x-ray pending.

## 2018-12-25 NOTE — ED Notes (Addendum)
Shock delivered at 200 j .

## 2018-12-25 NOTE — CV Procedure (Addendum)
   Ventricular fibrillation cardiac arrest occurring in the emergency room at [facility] where patient was subsequently resuscitated.  Post VF EKG revealed inferior ST elevation consistent with STEMI.  History of coronary artery disease with high-grade ostial to proximal circumflex and mid and distal RCA orbital atherectomy and stenting September 2019.  After speaking with ER physician, it was unclear to me if COVID 19 screened.  Dr. Thurnell Garbe asked others if the patient had been screened for COVID 19, "does anyone think she has COVID?", during our phone conversation.  Decision made to accept the patient to the Cath Lab and utilize the full protective protocol for pandemic era patients who are undiagnosed and could possibly have disease.  Upon arrival the patient had no functioning IVs.  Femoral venous access was achieved using ultrasound guidance.  A 6 French sheath was placed to provide an access for medication administration.  Right radial arterial access using real-time vascular ultrasound guidance.  Diagnostic JR4 demonstrated high-grade obstruction in the midportion of the proximal stent.  Proximal to mid RCA stent thrombosis with slow flow was treated with a 4.0 x 15 balloon dilated to 13 atm.  A 4.0 x 15 Sanders balloon dilated to 17 atm x 2.  Because of difficulty with communication in the room due to headsets being worn by all members other than the physician, information concerning the stent size was incorrectly relayed from the control room.  Final balloon size during the stent procedure in 2019 was quoted as 3.75 mm diameter.  After the procedure, it was identified that the stented area was with a 4.0 mm stent.  The intention would have been to use a 4.25 mm balloon to treat the stented segment rather than a 4.0 balloon.  The 99% stenosis with slow flow was reduced to less than 20% with TIMI grade III flow using a 4.0 balloon.  The venous sheath was sewn in place and hemostasis achieved  with a wristband.  Diagnosis: Type 4 B inferior STEMI complicated by VF and cardiac arrest with successful treatment.  Plan: Hypothermia; Vent support; IV amio; neuro assessment post hypothermia; monitor renal function as high risk for acute kidney injury; family (daughter) informed of poor short and intermediate term prognosis.

## 2018-12-25 NOTE — Patient Outreach (Signed)
[facility] Care Management  01/15/2019  BONNYE HALLE 10-05-1950 [id number]   EMMI-general discharge RED ON EMMI ALERT Day #1 Date:12/20/18 3762 Saturday Red Alert Reason:Transportation to follow-up? No  Insurance:[employer] medicare  [facility] admissions x2ED visits x2in the last 6 months Last admission at [facility] attempt # 3 No answer. [facility] RN CM left HIPAA compliant voicemail message along with CM's contact info.    Plan: [facility] RN CM sent an unsuccessful outreach letter on 12/22/18, left voice messages on 12/22/18, 12/23/18 & 01/11/2019  [facility] RN noted with Epic ADT that Mrs Simerly is hospitalized on 12/18/2018  [facility] RN CM pended this case out for case closure within the next 6 per [facility] workflow if no return call from patient or family   Vittorio Mohs L. [name], RN, BSN, [place] Coordinator Office number [phone] Mobile number [phone]  Main [facility] number [phone] Fax number [phone]

## 2018-12-25 NOTE — ED Notes (Signed)
Pt being intubated at this time. 

## 2018-12-25 NOTE — ED Notes (Signed)
Pulse detected at 64

## 2018-12-25 NOTE — Procedures (Signed)
Arterial Catheter Insertion Procedure Note Mary Raymond [id number] September 18, 1950  Procedure: Insertion of Arterial Catheter  Indications: Blood pressure monitoring and Frequent blood sampling  Procedure Details Consent: Risks of procedure as well as the alternatives and risks of each were explained to the (patient/caregiver).  Consent for procedure obtained. and Unable to obtain consent because of altered level of consciousness. Time Out: Verified patient identification, verified procedure, site/side was marked, verified correct patient position, special equipment/implants available, medications/allergies/relevent history reviewed, required imaging and test results available.  Performed  Maximum sterile technique was used including antiseptics, cap, gloves, gown, hand hygiene and mask. Skin prep: Chlorhexidine; local anesthetic administered 20 gauge catheter was inserted into left radial artery using the Seldinger technique. ULTRASOUND GUIDANCE USED: YES Evaluation Blood flow good; BP tracing good. Complications: No apparent complications.   Ulice Dash 12/26/2018

## 2018-12-25 NOTE — H&P (Addendum)
NAME:  Mary Raymond, MRN:  [id number], DOB:  [birth year], LOS: 0 ADMISSION DATE:  12/20/2018, CONSULTATION DATE:  12/30/2018 REFERRING MD:  Dr. Thurnell Garbe, CHIEF COMPLAINT:  Cardiac arrest   Brief History   68 yoF with known severe CAD, recent admit for acute on chronic diastolic HF, presenting with worsening SOB overnight since yesterday.  Diuresed in ER and about to be discharged home when found unresponsive on BSC, in Vfib w/ defib x 3, epi x 2 and amiodarone, with ROSC after 15 mins.  Unclear down time prior to being found.  EKG noted inferior STEMI, transferred emergently to [facility] cath lab.  History of present illness   HPI obtained from medical chart review as patient is intubated and sedated on MV.   68 year old female with extensive past medical history presenting with shortness of breath since yesterday and ongoing lower extremity swelling.    Recently hospitalized 3/23 - 4/1 with acute on chronic diastolic HF with cardiology workup with known severe disease of distal left main and circumflex, not a surgical candidate and poor candidate for further interventional options with suggested medical management.    In the ER, she was afebrile, normotensive and 100% on room air.  Labs noted for BNP 145, trop 0.12- 0.11, WBC 11.3, Hgb 9.4 (stable), and CXR showing mild pulmonary edema.  She was being diuresed in the ER with 700 ml diuresis and ambulated at 98% with improved but residual shortness of breath.  She was pending discharge home, but then was found unresponsive on bedside commode.  CPR initiated, defibrillated x 3 for vFib with epi and amiodarone with ROSC after 15 mins.  Unknown down time prior to being found.  She was intubated and placed on amiodarone drip and heparin.  EKG concerning for STE in inferior leads.  A code STEMI was called and patient emergently transferred to [facility] cath lab.  PCCM to accept.  Unclear mental status post arrest/ cooling not started.   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, CAD with severe known disease of distal left main and circumflex, hx MI, CKD stage III -IV, HTN, HLD, DM type 2, asthma, obesity, anxiety, osteoarthritis, chronic back pain, diabetic peripheral neuropathy, fibromyalgia, GERD, hypothyroidism, migraine headaches, occipital CVA,  vitamin D deficiency  Significant Hospital Events   4/9 Admitted/ cardiac arrest   Consults:  Cards   Procedures:  4/9 LHC >> s/p DES to RCA/ right radial TR band 4/9 ETT >> 4/9 R femoral single lumen CVL >>  Significant Diagnostic Tests:  12/09/2018 TTE >> LVEF 55-60%, moderate LVH, incoordinate septal motion, grade 2 DD, high LV filling pressure, moderate aortic stenosis, RV pacer, MAC with mild to moderate MR, mild LAE, moderate RAE, moderate TR, RVSP 62 mmHg, dilated IVC  Micro Data:   Antimicrobials:   Interim history/subjective:  From cath lab on fentanyl 50 mcg/hr, amio gtt  Objective   Blood pressure 124/78, pulse (!) 102, temperature 98.2 F (36.8 C), temperature source Oral, resp. rate (!) 24, height _0  (1.651 m), weight 120.3 kg, SpO2 100 %.    Vent Mode: PRVC FiO2 (%):  [100 %] 100 % Set Rate:  [18 bmp] 18 bmp Vt Set:  [450 mL] 450 mL PEEP:  [5 cmH20] 5 cmH20 Plateau Pressure:  [11 cmH20] 11 cmH20   Intake/Output Summary (Last 24 hours) at 12/30/2018 1743 Last data filed at 01/01/2019 1414 Gross per 24 hour  Intake -  Output 700 ml  Net -700 ml   Filed Weights   12/17/2018 0729 12/26/2018 0803  Weight: 117.9 kg 120.3 kg   Examination: General:  Morbidly obese, critically ill adult female on MV HEENT: MM pink/moist, ETT, pupils 3/sluggish, anicteric  Neuro: unresponsive CV: paced, no murmur, TR band to right wrist, single lumen femoral line to right groin PULM: even/non-labored, not breathing over set ratelungs bilaterally coarse/ faint insp wheeze, obvious air leak- appears pilot balloon is ruptured, will  not hold air but patient is getting her TVs GI: soft, +BS Extremities: cool/dry, no LE edema, toes dusky Skin: no rashes  Resolved Hospital Problem list    Assessment & Plan:  Vfib cardiac arrest with inferior STEMI - known severe CAD, previously not a surgical candidate P:  S/p LHC with DES to RCA  Appreciate cardiology input Aggrastat per cards  Daily ASA Tele monitoring  Trend trop/ EKG TTE in am  W/ TTM 33 protocol, will need new CVL placement (single lumen CVL in right groin) and aline  PAD protocol for RASS goal -4/-5 with fentanyl/ versed/ nimbex   Acute encephalopathy s/p cardiac arrest  P:  On arrival to ICU, patient remains unresponsive, no reported purposeful activity in cath lab- some chewing on ETT Initiate TTM 33 per protocol  EEG   Acute respiratory failure in the setting of cardiac arrest Mild pulmonary edema - s/p diuresis in ER - on arrival to ICU- appears pilot balloon is ruptured, she is getting her volumes  P:  Full MV support, PRVC 8 cc/kg, rate 15 CXR and ABG now Will likely need ETT exchanged  Insert Aline  VAP bundle duoneb prn   Acute on chronic diastolic HF - discharge wt 4/1 was 260 lbs P:  Per cards Hold home lasix   Hx symptomatic 2nd degree heart block s/p PPM 06/2018 - followed by Dr. Rayann Heman  P:  Tele monitor  Hypertension - currently normotensive  P:  Hold home lasix, metoprolol, losartan   HLD P:  Continue home rosuvastatin 40 mg daily   At risk for AKI on CKD stage III-IV (baseline sCr 1.25- 1.99) P:  S/p diuresis in ER with over 700 ml UOP Gentle hydration BMP per TTM protocol Mag/ phos now  Trend UOP/ daily wt/ renal panel/ mag Renal dose meds   DM type 2, insulin dependent P:  CBG q 3 SSI mod May need to add levemir   Hypothyroidism  P:  Continue home synthroid 50 mcg daily   Normocytic Anemia- baseline Hgb ~9 P:  Trend CBC  Anxiety P:  Hold home alprazolam   GERD P:  PPI   Best practice:   Diet: NPO Pain/Anxiety/Delirium protocol (if indicated): fentanyl /versed/ nimbex  VAP protocol (if indicated): yes DVT prophylaxis: SCDs GI prophylaxis: PPI Glucose control: SSI  Mobility: BR Code Status: Full  Family Communication: no family at bedside.  Disposition: ICU  Labs   CBC: Recent Labs  Lab 01/02/2019 0740  WBC 11.3*  NEUTROABS 7.9*  HGB 9.4*  HCT 31.3*  MCV 84.4  PLT 557    Basic Metabolic Panel: Recent Labs  Lab 12/18/2018 0740  NA 138  K 4.2  CL 99  CO2 25  GLUCOSE 159*  BUN 56*  CREATININE 1.68*  CALCIUM 9.6   GFR: Estimated Creatinine Clearance: 42.2 mL/min (A) (by C-G formula based on SCr of 1.68 mg/dL (H)). Recent Labs  Lab 12/23/2018 0740  WBC 11.3*    Liver Function Tests: Recent Labs  Lab 01/13/2019 0740  AST 18  ALT 14  ALKPHOS 99  BILITOT 0.5  PROT 7.5  ALBUMIN 3.4*   No results for input(s): LIPASE, AMYLASE in the last 168 hours. No results for input(s): AMMONIA in the last 168 hours.  ABG No results found for: PHART, PCO2ART, PO2ART, HCO3, TCO2, ACIDBASEDEF, O2SAT   Coagulation Profile: No results for input(s): INR, PROTIME in the last 168 hours.  Cardiac Enzymes: Recent Labs  Lab 01/09/2019 0740 12/22/2018 1156  TROPONINI 0.12* 0.11*    HbA1C: HB A1C (BAYER DCA - WAIVED)  Date/Time Value Ref Range Status  10/27/2018 11:33 AM 8.8 (H) <7.0 % Final    Comment:                                          Diabetic Adult            <7.0                                       Healthy Adult        4.3 - 5.7                                                           (DCCT/NGSP) American Diabetes Association's Summary of Glycemic Recommendations for Adults with Diabetes: Hemoglobin A1c <7.0%. More stringent glycemic goals (A1c <6.0%) may further reduce complications at the cost of increased risk of hypoglycemia.   02/25/2018 10:33 AM 8.3 (H) <7.0 % Final    Comment:                                          Diabetic Adult             <7.0                                       Healthy Adult        4.3 - 5.7                                                           (DCCT/NGSP) American Diabetes Association's Summary of Glycemic Recommendations for Adults with Diabetes: Hemoglobin A1c <7.0%. More stringent glycemic goals (A1c <6.0%) may further reduce complications at the cost of increased risk of hypoglycemia.    Hgb A1c MFr Bld  Date/Time Value Ref Range Status  06/05/2018 07:47 PM 7.3 (H) 4.8 - 5.6 % Final    Comment:    (NOTE) Pre diabetes:          5.7%-6.4% Diabetes:              >6.4% Glycemic control for   <7.0% adults with diabetes     CBG: No results for input(s):  GLUCAP in the last 168 hours.  Review of Systems:   Unable as patient is intubated.   Past Medical History  She,  has a past medical history of Acute coronary syndrome ([place]), Anxiety, Arthritis, Asthma, CHF (congestive heart failure) ([place]), Chronic lower back pain, Coronary artery disease, Diabetic peripheral neuropathy ([place]), Fibromyalgia, GERD (gastroesophageal reflux disease), Headache, Heart murmur, Hyperlipidemia, Hypertension, Hypothyroid, Migraine, Myocardial infarction ([place]) (06/11/2018), Obesity, Pneumonia, Seasonal allergies, Stroke ([place]) (05/2018), Type II diabetes mellitus ([place]), and Vitamin D deficiency.   Surgical History    Past Surgical History:  Procedure Laterality Date  . [place]?  Marland Kitchen BREAST BIOPSY Left 1970s  . BREAST LUMPECTOMY Left 1970s  . CESAREAN SECTION  1984  . CORONARY ATHERECTOMY N/A 06/11/2018   Procedure: CORONARY ATHERECTOMY;  Surgeon: Martinique, Peter M, MD;  Location: [place] [facility];  Service: Cardiovascular;  Laterality: N/A;  . CORONARY STENT INTERVENTION N/A 06/11/2018   Procedure: CORONARY STENT INTERVENTION;  Surgeon: Martinique, Peter M, MD;  Location: [place] [facility];  Service: Cardiovascular;  Laterality: N/A;  . LEFT HEART CATH AND CORONARY ANGIOGRAPHY N/A 06/06/2018    Procedure: LEFT HEART CATH AND CORONARY ANGIOGRAPHY;  Surgeon: Troy Sine, MD;  Location: [place] [facility];  Service: Cardiovascular;  Laterality: N/A;  . 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 VAGINAL HYSTERECTOMY  1997     Social History   reports that she has never smoked. She has never used smokeless tobacco. She reports that she does not drink alcohol or use drugs.   Family History   Her family history includes Asthma in her sister, sister, and sister; Cancer in her mother and sister; Dementia in her mother; Diabetes in her mother and sister; Heart disease in her father; Hypertension in her mother; Scoliosis in her sister.   Allergies Allergies  Allergen Reactions  . Atorvastatin Other (See Comments)    Leg weakness.  . Rosuvastatin Other (See Comments)    Leg weakness      Home Medications  Prior to Admission medications   Medication Sig Start Date End Date Taking? Authorizing Provider  acyclovir (ZOVIRAX) 400 MG tablet TAKE 1 TABLET EVERY DAY Patient taking differently: Take 400 mg by mouth daily.  10/22/18  Yes Hawks, Christy A, FNP  albuterol (PROVENTIL HFA;VENTOLIN HFA) 108 (90 Base) MCG/ACT inhaler Inhale 1-2 puffs into the lungs every 6 (six) hours as needed for wheezing or shortness of breath.   Yes [provider]  ALPRAZolam Duanne Moron) 0.5 MG tablet Take 1 tablet (0.5 mg total) by mouth at bedtime as needed for anxiety. TAKE 1 TABLET 2 TIMES A DAY AS NEEDED FOR ANXIETY 10/27/18  Yes Hawks, Christy A, FNP  aspirin EC 81 MG EC tablet Take 1 tablet (81 mg total) by mouth daily. 06/17/18  Yes Ghimire, Henreitta Leber, MD  blood glucose meter kit and supplies KIT Dispense based on patient and insurance preference. Use up to four times daily as directed. (FOR ICD-9 250.00, 250.01). 10/28/18  Yes Hawks, Christy A, FNP  Cholecalciferol (VITAMIN D) 50 MCG (2000 UT) tablet Take 2,000  Units by mouth daily.   Yes [provider]  clopidogrel (PLAVIX) 75 MG tablet Take 1 tablet (75 mg total) by mouth daily. 11/20/18  Yes Hawks, Christy A, FNP  ferrous sulfate 325 (65 FE) MG EC tablet Take 1 tablet (325 mg  total) by mouth daily with breakfast. 10/27/18  Yes Hawks, Christy A, FNP  furosemide (LASIX) 80 MG tablet Take 1 tablet (80 mg total) by mouth daily. 12/18/18  Yes Florencia Reasons, MD  gabapentin (NEURONTIN) 100 MG capsule Take 1 capsule (100 mg total) by mouth 3 (three) times daily. 11/20/18  Yes Hawks, Alyse Low A, FNP  glucose blood (ACCU-CHEK AVIVA PLUS) test strip Test BS QID E11.65 11/24/18  Yes Hawks, Christy A, FNP  HYDROcodone-acetaminophen (NORCO) 7.5-325 MG tablet Take 1 tablet by mouth every 12 (twelve) hours as needed for moderate pain. Patient taking differently: Take 1 tablet by mouth at bedtime.  10/27/18  Yes Hawks, Christy A, FNP  insulin aspart (NOVOLOG) 100 UNIT/ML injection CBG < 70: implement hypoglycemia protocol CBG 70 - 120: 0 units CBG 121 - 150: 0 units CBG 151 - 200: 0 units CBG 201 - 250: 2 units CBG 251 - 300: 3 units CBG 301 - 350: 4 units CBG 351 - 400: 5 units CBG > 400: call MD 06/16/18  Yes Ghimire, Henreitta Leber, MD  Insulin Pen Needle (DROPLET PEN NEEDLES) 31G X 5 MM MISC Use to give Lantus BID Dx E11.65 12/05/18  Yes Hawks, Christy A, FNP  Ipratropium-Albuterol (COMBIVENT RESPIMAT) 20-100 MCG/ACT AERS respimat INHALE 1 PUFF INTO THE LUNGS EVERY 6 (SIX) HOURS AS NEEDED FOR WHEEZING. Patient taking differently: Inhale 1 puff into the lungs every 6 (six) hours as needed for wheezing or shortness of breath.  10/27/18  Yes Hawks, Christy A, FNP  LANTUS SOLOSTAR 100 UNIT/ML Solostar Pen INJECT 28 UNITS INTO THE SKIN 2 (TWO) TIMES DAILY. 12/23/18  Yes Hawks, Christy A, FNP  levothyroxine (SYNTHROID, LEVOTHROID) 50 MCG tablet TAKE 1 TABLET EVERY DAY BEFORE BREAKFAST Patient taking differently: Take 50 mcg by mouth daily before breakfast.  11/20/18  Yes Hawks,  Christy A, FNP  losartan (COZAAR) 25 MG tablet Take 1 tablet (25 mg total) by mouth daily. 12/18/18  Yes Florencia Reasons, MD  metFORMIN (GLUCOPHAGE) 1000 MG tablet Take 1 tablet (1,000 mg total) by mouth 2 (two) times daily with a meal. 10/27/18  Yes Hawks, Christy A, FNP  metoprolol succinate (TOPROL-XL) 25 MG 24 hr tablet TAKE 1 TABLET EVERY DAY 12/23/18  Yes Hawks, Christy A, FNP  montelukast (SINGULAIR) 10 MG tablet Take 1 tablet (10 mg total) by mouth at bedtime. 10/22/18  Yes Hawks, Christy A, FNP  nitroGLYCERIN (NITROSTAT) 0.4 MG SL tablet Place 1 tablet (0.4 mg total) under the tongue every 5 (five) minutes x 3 doses as needed for chest pain. 12/17/18  Yes Florencia Reasons, MD  ondansetron (ZOFRAN) 4 MG tablet Take 4 mg by mouth every 8 (eight) hours as needed for nausea or vomiting.  10/27/18  Yes [provider]  pantoprazole (PROTONIX) 40 MG tablet Take 1 tablet (40 mg total) by mouth daily. 10/27/18  Yes Hawks, Christy A, FNP  polyethylene glycol (MIRALAX / GLYCOLAX) packet Take 17 g by mouth daily. 12/18/18  Yes Florencia Reasons, MD  rosuvastatin (CRESTOR) 40 MG tablet Take 1 tablet (40 mg total) by mouth daily. 10/27/18  Yes Hawks, Christy A, FNP  vitamin C (ASCORBIC ACID) 500 MG tablet Take 500 mg by mouth daily.   Yes [provider]  Accu-Chek Softclix Lancets lancets Test Blood glucose three times daily Dx: E11.9 12/23/18   [name], FNP     Critical care time: 35 mins   Kennieth Rad, MSN, AGACNP-BC [facility] Pgr: [phone] or if  no answer [phone] 12/20/2018, 7:26 PM

## 2018-12-25 NOTE — Progress Notes (Signed)
Called elink MD to notify that pt will not be able to reach targeted temp by 3hrs post-pad placement. MD stated to continue management & monitor.   VSS & will continue to monitor pt closely.
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
## 2018-12-26 ENCOUNTER — Encounter (HOSPITAL_COMMUNITY): Payer: Self-pay | Admitting: Interventional Cardiology

## 2018-12-26 ENCOUNTER — Telehealth: Payer: Self-pay

## 2018-12-26 DIAGNOSIS — I213 ST elevation (STEMI) myocardial infarction of unspecified site: Secondary | ICD-10-CM

## 2018-12-26 DIAGNOSIS — G92 Toxic encephalopathy: Secondary | ICD-10-CM

## 2018-12-26 DIAGNOSIS — I639 Cerebral infarction, unspecified: Secondary | ICD-10-CM

## 2018-12-26 LAB — COMPREHENSIVE METABOLIC PANEL
ALT: 48 U/L — ABNORMAL HIGH (ref 0–44)
AST: 67 U/L — ABNORMAL HIGH (ref 15–41)
Albumin: 3 g/dL — ABNORMAL LOW (ref 3.5–5.0)
Alkaline Phosphatase: 115 U/L (ref 38–126)
Anion gap: 23 — ABNORMAL HIGH (ref 5–15)
BUN: 58 mg/dL — ABNORMAL HIGH (ref 8–23)
CO2: 15 mmol/L — ABNORMAL LOW (ref 22–32)
Calcium: 9.2 mg/dL (ref 8.9–10.3)
Chloride: 97 mmol/L — ABNORMAL LOW (ref 98–111)
Creatinine, Ser: 2.31 mg/dL — ABNORMAL HIGH (ref 0.44–1.00)
GFR calc Af Amer: 25 mL/min — ABNORMAL LOW (ref >60)
GFR calc non Af Amer: 21 mL/min — ABNORMAL LOW (ref >60)
Glucose, Bld: 390 mg/dL — ABNORMAL HIGH (ref 70–99)
Potassium: 3.4 mmol/L — ABNORMAL LOW (ref 3.5–5.1)
Sodium: 135 mmol/L (ref 135–145)
Total Bilirubin: 1.5 mg/dL — ABNORMAL HIGH (ref 0.3–1.2)
Total Protein: 6.2 g/dL — ABNORMAL LOW (ref 6.5–8.1)

## 2018-12-26 LAB — CBC
HCT: 30.2 % — ABNORMAL LOW (ref 36.0–46.0)
HCT: 29.1 % — ABNORMAL LOW (ref 36.0–46.0)
Hemoglobin: 9.1 g/dL — ABNORMAL LOW (ref 12.0–15.0)
Hemoglobin: 8.6 g/dL — ABNORMAL LOW (ref 12.0–15.0)
MCH: 25.3 pg — ABNORMAL LOW (ref 26.0–34.0)
MCH: 24.6 pg — ABNORMAL LOW (ref 26.0–34.0)
MCHC: 30.1 g/dL (ref 30.0–36.0)
MCHC: 29.6 g/dL — ABNORMAL LOW (ref 30.0–36.0)
MCV: 83.9 fL (ref 80.0–100.0)
MCV: 83.1 fL (ref 80.0–100.0)
Platelets: 282 10*3/uL (ref 150–400)
Platelets: 292 10*3/uL (ref 150–400)
RBC: 3.6 MIL/uL — ABNORMAL LOW (ref 3.87–5.11)
RBC: 3.5 MIL/uL — ABNORMAL LOW (ref 3.87–5.11)
RDW: 16.7 % — ABNORMAL HIGH (ref 11.5–15.5)
RDW: 16.8 % — ABNORMAL HIGH (ref 11.5–15.5)
WBC: 23 10*3/uL — ABNORMAL HIGH (ref 4.0–10.5)
WBC: 22.9 10*3/uL — ABNORMAL HIGH (ref 4.0–10.5)
nRBC: 0 % (ref 0.0–0.2)
nRBC: 0 % (ref 0.0–0.2)

## 2018-12-26 LAB — MAGNESIUM: Magnesium: 1.9 mg/dL (ref 1.7–2.4)

## 2018-12-26 LAB — GLUCOSE, CAPILLARY
Glucose-Capillary: 245 mg/dL — ABNORMAL HIGH (ref 70–99)
Glucose-Capillary: 279 mg/dL — ABNORMAL HIGH (ref 70–99)
Glucose-Capillary: 318 mg/dL — ABNORMAL HIGH (ref 70–99)
Glucose-Capillary: 347 mg/dL — ABNORMAL HIGH (ref 70–99)
Glucose-Capillary: 384 mg/dL — ABNORMAL HIGH (ref 70–99)
Glucose-Capillary: 407 mg/dL — ABNORMAL HIGH (ref 70–99)
Glucose-Capillary: 414 mg/dL — ABNORMAL HIGH (ref 70–99)
Glucose-Capillary: 179 mg/dL — ABNORMAL HIGH (ref 70–99)
Glucose-Capillary: 215 mg/dL — ABNORMAL HIGH (ref 70–99)
Glucose-Capillary: 156 mg/dL — ABNORMAL HIGH (ref 70–99)
Glucose-Capillary: 137 mg/dL — ABNORMAL HIGH (ref 70–99)
Glucose-Capillary: 113 mg/dL — ABNORMAL HIGH (ref 70–99)
Glucose-Capillary: 116 mg/dL — ABNORMAL HIGH (ref 70–99)
Glucose-Capillary: 122 mg/dL — ABNORMAL HIGH (ref 70–99)
Glucose-Capillary: 127 mg/dL — ABNORMAL HIGH (ref 70–99)
Glucose-Capillary: 129 mg/dL — ABNORMAL HIGH (ref 70–99)
Glucose-Capillary: 144 mg/dL — ABNORMAL HIGH (ref 70–99)
Glucose-Capillary: 147 mg/dL — ABNORMAL HIGH (ref 70–99)
Glucose-Capillary: 148 mg/dL — ABNORMAL HIGH (ref 70–99)
Glucose-Capillary: 154 mg/dL — ABNORMAL HIGH (ref 70–99)
Glucose-Capillary: 172 mg/dL — ABNORMAL HIGH (ref 70–99)

## 2018-12-26 LAB — BASIC METABOLIC PANEL
Anion gap: 22 — ABNORMAL HIGH (ref 5–15)
Anion gap: 20 — ABNORMAL HIGH (ref 5–15)
Anion gap: 20 — ABNORMAL HIGH (ref 5–15)
Anion gap: 19 — ABNORMAL HIGH (ref 5–15)
BUN: 58 mg/dL — ABNORMAL HIGH (ref 8–23)
BUN: 56 mg/dL — ABNORMAL HIGH (ref 8–23)
BUN: 55 mg/dL — ABNORMAL HIGH (ref 8–23)
BUN: 53 mg/dL — ABNORMAL HIGH (ref 8–23)
CO2: 16 mmol/L — ABNORMAL LOW (ref 22–32)
CO2: 18 mmol/L — ABNORMAL LOW (ref 22–32)
CO2: 17 mmol/L — ABNORMAL LOW (ref 22–32)
CO2: 16 mmol/L — ABNORMAL LOW (ref 22–32)
Calcium: 8.9 mg/dL (ref 8.9–10.3)
Calcium: 9 mg/dL (ref 8.9–10.3)
Calcium: 8.9 mg/dL (ref 8.9–10.3)
Calcium: 8.9 mg/dL (ref 8.9–10.3)
Chloride: 99 mmol/L (ref 98–111)
Chloride: 99 mmol/L (ref 98–111)
Chloride: 102 mmol/L (ref 98–111)
Chloride: 103 mmol/L (ref 98–111)
Creatinine, Ser: 2.19 mg/dL — ABNORMAL HIGH (ref 0.44–1.00)
Creatinine, Ser: 2.31 mg/dL — ABNORMAL HIGH (ref 0.44–1.00)
Creatinine, Ser: 2.04 mg/dL — ABNORMAL HIGH (ref 0.44–1.00)
Creatinine, Ser: 2.04 mg/dL — ABNORMAL HIGH (ref 0.44–1.00)
GFR calc Af Amer: 26 mL/min — ABNORMAL LOW (ref >60)
GFR calc Af Amer: 25 mL/min — ABNORMAL LOW (ref >60)
GFR calc Af Amer: 29 mL/min — ABNORMAL LOW (ref >60)
GFR calc Af Amer: 29 mL/min — ABNORMAL LOW (ref >60)
GFR calc non Af Amer: 23 mL/min — ABNORMAL LOW (ref >60)
GFR calc non Af Amer: 21 mL/min — ABNORMAL LOW (ref >60)
GFR calc non Af Amer: 25 mL/min — ABNORMAL LOW (ref >60)
GFR calc non Af Amer: 25 mL/min — ABNORMAL LOW (ref >60)
Glucose, Bld: 302 mg/dL — ABNORMAL HIGH (ref 70–99)
Glucose, Bld: 245 mg/dL — ABNORMAL HIGH (ref 70–99)
Glucose, Bld: 142 mg/dL — ABNORMAL HIGH (ref 70–99)
Glucose, Bld: 201 mg/dL — ABNORMAL HIGH (ref 70–99)
Potassium: 3 mmol/L — ABNORMAL LOW (ref 3.5–5.1)
Potassium: 3.1 mmol/L — ABNORMAL LOW (ref 3.5–5.1)
Potassium: 3.2 mmol/L — ABNORMAL LOW (ref 3.5–5.1)
Potassium: 3.8 mmol/L (ref 3.5–5.1)
Sodium: 137 mmol/L (ref 135–145)
Sodium: 137 mmol/L (ref 135–145)
Sodium: 139 mmol/L (ref 135–145)
Sodium: 138 mmol/L (ref 135–145)

## 2018-12-26 LAB — TROPONIN I
Troponin I: 2.05 ng/mL (ref <0.03)
Troponin I: 1.84 ng/mL (ref <0.03)
Troponin I: 1.73 ng/mL (ref <0.03)

## 2018-12-26 LAB — PROTIME-INR
INR: 1.2 (ref 0.8–1.2)
Prothrombin Time: 15 seconds (ref 11.4–15.2)

## 2018-12-26 LAB — APTT: aPTT: 33 seconds (ref 24–36)

## 2018-12-26 MED ORDER — INSULIN ASPART 100 UNIT/ML ~~LOC~~ SOLN
1.0000 [IU] | SUBCUTANEOUS | Status: DC
  Administered 2018-12-26 (×2): 2 [IU] via SUBCUTANEOUS
  Administered 2018-12-27: 3 [IU] via SUBCUTANEOUS
  Administered 2018-12-27 (×2): 2 [IU] via SUBCUTANEOUS

## 2018-12-26 MED ORDER — SODIUM CHLORIDE 0.9% FLUSH: 10.0000 mL | INTRAVENOUS | Status: DC | PRN

## 2018-12-26 MED ORDER — TIROFIBAN HCL IN NACL 5-0.9 MG/100ML-% IV SOLN
INTRAVENOUS | Status: DC | PRN
  Administered 2018-12-25: 0.075 ug/kg/min via INTRAVENOUS

## 2018-12-26 MED ORDER — ROSUVASTATIN CALCIUM 20 MG PO TABS
40.0000 mg | ORAL_TABLET | Freq: Every day | ORAL | Status: DC
  Administered 2018-12-26 – 2018-12-30 (×4): 40 mg
  Filled 2018-12-26 (×4): qty 2

## 2018-12-26 MED ORDER — POTASSIUM CHLORIDE 20 MEQ/15ML (10%) PO SOLN
40.0000 meq | Freq: Once | ORAL | Status: AC
  Administered 2018-12-26: 03:00:00 40 meq
  Filled 2018-12-26: qty 30

## 2018-12-26 MED ORDER — INSULIN DETEMIR 100 UNIT/ML ~~LOC~~ SOLN
5.0000 [IU] | Freq: Two times a day (BID) | SUBCUTANEOUS | Status: DC
  Administered 2018-12-26 – 2018-12-27 (×3): 5 [IU] via SUBCUTANEOUS
  Filled 2018-12-26 (×5): qty 0.05

## 2018-12-26 MED ORDER — POTASSIUM CHLORIDE 20 MEQ/15ML (10%) PO SOLN
40.0000 meq | Freq: Once | ORAL | Status: AC
  Administered 2018-12-26: 14:00:00 40 meq
  Filled 2018-12-26: qty 30

## 2018-12-26 MED ORDER — POTASSIUM CHLORIDE 20 MEQ/15ML (10%) PO SOLN
40.0000 meq | Freq: Once | ORAL | Status: AC
  Administered 2018-12-26: 06:00:00 40 meq

## 2018-12-26 MED ORDER — PANTOPRAZOLE SODIUM 40 MG PO PACK
40.0000 mg | PACK | Freq: Every day | ORAL | Status: DC
  Administered 2018-12-26 – 2019-01-03 (×9): 40 mg
  Filled 2018-12-26 (×10): qty 20

## 2018-12-26 MED ORDER — POTASSIUM CHLORIDE 20 MEQ/15ML (10%) PO SOLN
ORAL | Status: AC
  Filled 2018-12-26: qty 30

## 2018-12-26 MED ORDER — SODIUM CHLORIDE 0.9% FLUSH
10.0000 mL | Freq: Two times a day (BID) | INTRAVENOUS | Status: DC
  Administered 2018-12-26 – 2018-12-31 (×7): 10 mL
  Administered 2019-01-01: 20 mL
  Administered 2019-01-01: 10 mL

## 2018-12-26 MED ORDER — CHLORHEXIDINE GLUCONATE CLOTH 2 % EX PADS
6.0000 | MEDICATED_PAD | Freq: Every day | CUTANEOUS | Status: DC
  Administered 2018-12-26 – 2019-01-03 (×9): 6 via TOPICAL

## 2018-12-26 MED FILL — Nitroglycerin IV Soln 100 MCG/ML in D5W: INTRA_ARTERIAL | Qty: 10 | Status: AC

## 2018-12-26 MED FILL — Fentanyl Citrate Preservative Free (PF) Inj 100 MCG/2ML: INTRAMUSCULAR | Qty: 2 | Status: AC

## 2018-12-26 MED FILL — Heparin Sod (Porcine)-NaCl IV Soln 1000 Unit/500ML-0.9%: INTRAVENOUS | Qty: 1000 | Status: AC

## 2018-12-26 NOTE — Procedures (Signed)
ELECTROENCEPHALOGRAM REPORT   Patient: Mary Raymond       Room #: [facility] EEG No. ID: [id number] Age: 68 y.o.        Sex: female Referring Physician: Elsworth Soho Report Date:  12/26/2018        Interpreting Physician: Alexis Goodell  History: [name] is an 68 y.o. female s/p arrest  Medications:  Amiodarone, Nimbex, Insulin, Levophed, Diprovan, Fentanyl, Synthroid, Ticagrelor  Conditions of Recording:  This is a 21 channel routine scalp EEG performed with bipolar and monopolar montages arranged in accordance to the international 10/20 system of electrode placement. One channel was dedicated to EKG recording.  The patient is in the intubated, sedated and paralyzed state.   Description:  The background activity is discontinuous. It consists of bursts of polyspike, spike and slow wave activity alternating with periods of attenuation. The burst activity lasts up to 3 seconds. On most occasions this burst activity is synchronous but there are some occasions when this activity is more prominent on the left.  There also appears to be more embedded sharp activity on the left as well.  The periods of attenuation last up to 4 seconds. This discontinuous activity is maintained throughout the tracing.  No activation procedures were performed. Hyperventilation and intermittent photic stimulation were not performed.  IMPRESSION: This is an abnormal EEG due to a burst-suppression pattern seen throughout the tracing.  Burst suppression pattern can be seen in a variety of circumstances, including anesthesia, drug intoxication, hypothermia, as well as cerebral anoxia.  Clinical/neurological, and radiographic correlation advised.     Alexis Goodell, MD Neurology [phone] 12/26/2018, 9:10 AM

## 2018-12-26 NOTE — Progress Notes (Signed)
[facility] Progress Note Patient Name: Mary Raymond DOB: [birth year] MRN: [id number]   Date of Service  12/26/2018  HPI/Events of Note  K 3.4, creatinine 2.31. Still on the cooling phase of TTM  eICU Interventions  Ordered K 40 meqs x1     Intervention Category Major Interventions: Electrolyte abnormality - evaluation and management  Shona Needles Vu Liebman 12/26/2018, 3:06 AM

## 2018-12-26 NOTE — Progress Notes (Signed)
[facility] Progress Note Patient Name: Mary Raymond DOB: Nov 12, 1950 MRN: [id number]   Date of Service  12/26/2018  HPI/Events of Note  K 3.1, still on the cooling phase of TTM  eICU Interventions   Ordered another K 40 meqs per OG  Check Mg level     Intervention Category Major Interventions: Electrolyte abnormality - evaluation and management  Judd Lien 12/26/2018, 5:48 AM

## 2018-12-26 NOTE — Progress Notes (Addendum)
Initial Nutrition Assessment  RD working remotely.  DOCUMENTATION CODES:   Morbid obesity  INTERVENTION:   Recommend initiation of enteral nutrition via OG tube: - Osmolite 1.5 @ 20 ml/hr (480 ml/day) - Pro-stat 60 ml QID  Tube feeding regimen provides 1520 kcal, 150 grams of protein, and 366 ml of H2O (100% of protein needs).  Tube feeding regimen and current propofol provides 2853 total kcal (>100% of kcal needs). Given high rate of propofol, unable to meet pt's protein needs without exceeding kcal needs.  NUTRITION DIAGNOSIS:   Inadequate oral intake related to inability to eat as evidenced by NPO status.  GOAL:   Provide needs based on ASPEN/SCCM guidelines  MONITOR:   Vent status, Weight trends, I & O's, Labs  REASON FOR ASSESSMENT:   Ventilator    ASSESSMENT:   68 year old female who presented to [facility] on 4/09 with SOB. PMH of CHF, severe CAD s/p stenting, IDDM, hypothyroidism, CKD stage III, HLD, HTN. Pt found unresponsive on BSC and intubated then transferred to [facility] s/p Vfib arrest with defib x 3, epi x 2 and amiodarone with ROSC after 15 minutes. EKG noted inferior STEMI. S/p cath lab with lot seen on previous RCA stent. Pt admitted to the ICU and started on TTM on 4/09.  Pt on hypothermia protocol. OG tube in place, currently to low intermittent suction. Reviewed RN edema assessment. Pt with mild pitting generalized edema and mild pitting edema to BLE.  Patient is currently intubated on ventilator support. MV: 8.8 L/min Temp (24hrs), Avg:93.6 F (34.2 C), Min:89.2 F (31.8 C), Max:98.8 F (37.1 C) BP: 118/68 MAP: 84  Propofol: 50.5 ml/hr (provides 1333 kcal daily) Fentanyl: 30 ml/hr Nimbex: 10.8 ml/hr Levophed: 7.5 ml/hr Amiodarone: 16.7 ml/hr Insulin: 4.9 ml/hr  Medications reviewed and include: heparin, levothyroxine IVF: NS @ 10 ml/hr  Labs reviewed: potassium 3.1 (L), BUN 56 (H), creatinine 2.31 (H), elevated LFTs, hemoglobin 8.6  (L) CBG's: 137, 156, 179, 215, 245, 279, 318, 347, 384 x 12 hours (trending down, on insulin drip)  UOP: 1230 ml x 24 hours OGT output: 150 ml x 24 hours  NUTRITION - FOCUSED PHYSICAL EXAM:  Unable to complete at this time. RD working remotely.  Diet Order:   Diet Order            Diet NPO time specified  Diet effective now              EDUCATION NEEDS:   Not appropriate for education at this time  Skin:  Skin Assessment: Reviewed RN Assessment  Last BM:  PTA/unknown  Height:   Ht Readings from Last 1 Encounters:  12/21/2018 5\' 5"  (1.651 m)    Weight:   Wt Readings from Last 1 Encounters:  12/24/2018 120.3 kg    Ideal Body Weight:  56.8 kg  BMI:  Body mass index is 44.12 kg/m.  Estimated Nutritional Needs:   Kcal:  6060-0459  Protein:  >/= 142 grams  Fluid:  >/= 1.5 L    Mary Face, MS, RD, LDN Inpatient Clinical Dietitian Pager: [phone] Weekend/After Hours: [phone]

## 2018-12-26 NOTE — Progress Notes (Signed)
Progress Note  Patient Name: Mary Raymond Date of Encounter: 12/26/2018  Primary Cardiologist: Carlyle Dolly, MD Primary electrophysiologist: [name], MD  Subjective   Intubated and sedated on vent. Undergoing hypothermia protocol; 33.2 C  Inpatient Medications    Scheduled Meds: . artificial tears  1 application Both Eyes X5A  . aspirin EC  81 mg Oral Daily  . chlorhexidine gluconate (MEDLINE KIT)  15 mL Mouth Rinse BID  . Chlorhexidine Gluconate Cloth  6 each Topical Daily  . fentaNYL (SUBLIMAZE) injection  50 mcg Intravenous Once  . heparin  5,000 Units Subcutaneous Q8H  . levothyroxine  25 mcg Intravenous Daily  . mouth rinse  15 mL Mouth Rinse 10 times per day  . potassium chloride      . sodium chloride flush  10-40 mL Intracatheter Q12H  . sodium chloride flush  3 mL Intravenous Q12H  . ticagrelor  90 mg Oral BID   Continuous Infusions: . sodium chloride 10 mL/hr at 12/26/18 0900  . sodium chloride    . sodium chloride    . amiodarone 30 mg/hr (12/26/18 0900)  . cisatracurium (NIMBEX) infusion 1.5 mcg/kg/min (12/26/18 0900)  . fentaNYL infusion INTRAVENOUS 300 mcg/hr (12/26/18 0900)  . insulin 4.9 mL/hr at 12/26/18 0900  . norepinephrine (LEVOPHED) Adult infusion 2 mcg/min (12/26/18 0900)  . propofol (DIPRIVAN) infusion 70 mcg/kg/min (12/26/18 0900)   PRN Meds: Place/Maintain arterial line **AND** sodium chloride, sodium chloride, acetaminophen, [COMPLETED] cisatracurium **AND** cisatracurium (NIMBEX) infusion **AND** cisatracurium, fentaNYL, ipratropium-albuterol, ondansetron (ZOFRAN) IV, sodium chloride flush, sodium chloride flush   Vital Signs    Vitals:   12/26/18 0815 12/26/18 0830 12/26/18 0845 12/26/18 0900  BP:      Pulse: (!) 59 60 (!) 59 (!) 59  Resp: '18 18 18 18  '$ Temp:    (!) 90.9 F (32.7 C)  TempSrc:    Core  SpO2: 100% 100% 100% 100%  Weight:      Height:        Intake/Output Summary (Last 24 hours) at 12/26/2018 1009  Last data filed at 12/26/2018 0900 Gross per 24 hour  Intake 2234.34 ml  Output 1495 ml  Net 739.34 ml    I/O since admission: +739  Filed Weights   01/04/2019 0729 12/26/2018 0803  Weight: 117.9 kg 120.3 kg    Telemetry    Currently AV pacing at 60- Personally Reviewed  ECG    ECG (independently read by me): AV paced at 61      Physical Exam   BP 118/68   Pulse (!) 59   Temp (!) 90.9 F (32.7 C) (Core)   Resp 18   Ht '5\' 5"'$  (1.651 m)   Wt 120.3 kg   SpO2 100%   BMI 44.12 kg/m  General: Sedated and paralyzed on ventilator Skin: normal turgor, no rashes, warm and dry HEENT: Normocephalic, atraumatic. Pupils equal round and reactive to light; sclera anicteric; extraocular muscles intact; Nose without nasal septal hypertrophy Mouth/Parynx;  intubated Neck: No JVD, no carotid bruits; normal carotid upstroke Lungs: clear to ausculatation and percussion; no wheezing or rales Chest wall: without tenderness to palpitation Heart: PMI not displaced, RRR, s1 s2 normal, 1/6 systolic murmur, no diastolic murmur, no rubs, gallops, thrills, or heaves Abdomen: obese;  soft, nontender; no hepatosplenomehaly, BS+; abdominal aorta nontender and not dilated by palpation. Back: no CVA tenderness Pulses 2+: Right radial cath site stable without ecchymoses. Musculoskeletal: full range of motion, normal strength, no joint deformities Extremities: no  clubbing cyanosis or edema, Homan's sign negative  Neurologic: Not assessed   Labs    Chemistry Recent Labs  Lab 12/17/2018 0740  12/26/18 0039 12/26/18 0256 12/26/18 0415  NA 138   < > 135 137 137  K 4.2   < > 3.4* 3.0* 3.1*  CL 99   < > 97* 99 99  CO2 25   < > 15* 16* 18*  GLUCOSE 159*   < > 390* 302* 245*  BUN 56*   < > 58* 58* 56*  CREATININE 1.68*   < > 2.31* 2.19* 2.31*  CALCIUM 9.6   < > 9.2 8.9 9.0  PROT 7.5  --  6.2*  --   --   ALBUMIN 3.4*  --  3.0*  --   --   AST 18  --  67*  --   --   ALT 14  --  48*  --   --   ALKPHOS  99  --  115  --   --   BILITOT 0.5  --  1.5*  --   --   GFRNONAA 31*   < > 21* 23* 21*  GFRAA 36*   < > 25* 26* 25*  ANIONGAP 14   < > 23* 22* 20*   < > = values in this interval not displayed.     Hematology Recent Labs  Lab 01/02/2019 2200 12/26/18 0039 12/26/18 0415  WBC 31.0* 23.0* 22.9*  RBC 3.84* 3.60* 3.50*  HGB 9.6* 9.1* 8.6*  HCT 32.2* 30.2* 29.1*  MCV 83.9 83.9 83.1  MCH 25.0* 25.3* 24.6*  MCHC 29.8* 30.1 29.6*  RDW 16.9* 16.7* 16.8*  PLT 350 282 292    Cardiac Enzymes Recent Labs  Lab 01/15/2019 1156 01/07/2019 2004 12/26/18 0039 12/26/18 0256  TROPONINI 0.11* 1.16* 2.05* 1.84*   No results for input(s): TROPIPOC in the last 168 hours.   BNP Recent Labs  Lab 01/09/2019 0740  BNP 145.0*     DDimer No results for input(s): DDIMER in the last 168 hours.   Lipid Panel     Component Value Date/Time   CHOL 120 10/27/2018 1133   CHOL 248 (H) 02/23/2013 1159   TRIG 153 (H) 01/15/2019 2008   TRIG 274 (H) 09/16/2013 0846   TRIG 359 (H) 02/23/2013 1159   HDL 45 10/27/2018 1133   HDL 45 09/16/2013 0846   HDL 45 02/23/2013 1159   CHOLHDL 2.7 10/27/2018 1133   CHOLHDL 4.3 06/09/2018 0650   VLDL 33 06/09/2018 0650   LDLCALC 25 10/27/2018 1133   LDLCALC 145 (H) 09/16/2013 0846   LDLCALC 131 (H) 02/23/2013 1159     Radiology    Dg Chest Port 1 View  Result Date: 12/24/2018 CLINICAL DATA:  Hypoxia EXAM: PORTABLE CHEST 1 VIEW COMPARISON:  December 25, 2018 study obtained earlier in the day FINDINGS: Endotracheal tube tip is 2.7 cm above the carina. Nasogastric tube tip and side port are below the diaphragm. Pacemaker leads are attached the right atrium and right ventricle. No pneumothorax. There is patchy consolidation in the left base. There is interstitial prominence in the left lower lobe as well. The right lung is clear. Heart is borderline enlarged with pulmonary vascularity normal. There is aortic atherosclerosis. No bone lesions. IMPRESSION: Tube positions as  described without pneumothorax. Pacemaker leads attached to right atrium and right ventricle. Focal airspace opacity left base consistent with pneumonia or possibly aspiration. Interstitial prominence in the left lower lobe is also present,  potentially also indicative of pneumonia or aspiration. Right lung clear. Stable cardiac prominence.  Aortic Atherosclerosis (ICD10-I70.0). Electronically Signed   By: Lowella Grip III M.D.   On: 12/19/2018 21:12   Dg Chest Port 1 View  Result Date: 01/13/2019 CLINICAL DATA:  HERE FOR SOB. HX: ASTHMA, DIABETES, HTN, CVA, PACEMAKER, GERD, MI, HEART MUR-MUR, CAD WITH STENT PLACEMENT. EXAM: PORTABLE CHEST 1 VIEW COMPARISON:  12/08/2018 FINDINGS: Mild to moderate enlargement of the cardiopericardial silhouette with indistinct pulmonary vasculature and Kerley B lines suggesting mild interstitial edema. Dual lead pacer. Atherosclerotic calcification of the aortic arch. No overt airspace opacity. IMPRESSION: 1. Mild to moderate enlargement of the cardiopericardial silhouette with suspected mild interstitial edema. No overt airspace edema. 2.  Aortic Atherosclerosis (ICD10-I70.0). Electronically Signed   By: Van Clines M.D.   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  Globular thrombus was noted.  Successful angioplasty with high pressure using a 4.0 x 15 mm [place] balloon to 17 atm. x multiple inflations.  Mistakenly, it was reported by the control room that the stent was postdilated with a 3.75 balloon during the implantation in 2019.  Stable anatomy of the left coronary system with distal 30 to 40% left main, ostial to proximal 99% small to moderate sized circumflex, and irregularities in the proximal to mid LAD.  Elevated LVEDP, 21  mmHg.  Inferior wall hypokinesis.  EF 50%.  RECOMMENDATIONS:   Loaded with Brilinta and discontinue Plavix  Discontinue IV Aggrastat 3 to 4 hours after Brilinta loading dose  Critical care medicine to determine whether or not cooling protocol is indicated and to assist with management including control of ventilator.  Overall poor prognosis given residual disease and the unfortunate occurrence of stent thrombosis in this patient with multiple comorbidities that prevented CABG from being performed.     Intervention      Patient Profile     Mary Raymond is a 68 y.o. female with a history of hypertension, hyperlipidemia, type 2 diabetes mellitus, diastolic heart failure, and CAD status post NSTEMI in September 2019 with finding of multivessel CAD (turned down for CABG) and status post DES x2 to the proximal and mid RCA at that time.  She suffered a VF cardiac arrest just prior to being discharged from [facility] yesterday and was transferred to [place]    1.  VF cardiac arrest 12/29/2018 secondary to subtotal RCA in-stent restenosis.  Now being cooled on Arctic sun protocol.  Status post PCI to the 99% in-stent proximal RCA stenosis and a large dominant RCA vessel.  No recurrent arrhythmia currently on amiodarone drip, and levophed with stable blood pressure.  Plan to start rewarming tomorrow.  Peak Troponin 2.05.  2.  Significant concomitant CAD with densely calcified ostial high-grade circumflex stenosis 50% proximal LAD stenosis.  3.  Hypothermia protocol: Currently intubated on the ventilator followed by CCM.  Echo Doppler study currently being done at the bedside by CCM team.  EF appears in the 40 to 45% with inferior and inferoseptal significant hypokinesis.  4. Acute kidney injury post catheterization.  Creatinine today increased to 2.31  5.  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  6.   Ventilated followed by CCM  7.  Hyperlipidemia with target LDL less than 70:  history of mixed hyperlipidemia, was on rosuvastatin 40 mg prior to admission.  Recent LDL in February 2020 was 25  8.  Diabetes mellitus with complication on insulin.  9.  Hypothyroidism, on levothyroxine 50 mcg  CC time : 40 minutes  Signed, Troy Sine, MD, [facility] 12/26/2018, 10:09 AM

## 2018-12-26 NOTE — Progress Notes (Addendum)
NAME:  Mary Raymond, MRN:  [id number], DOB:  24-Jan-1951, LOS: 1 ADMISSION DATE:  12/20/2018, CONSULTATION DATE:  01/11/2019 REFERRING MD:  Dr. Thurnell Garbe, CHIEF COMPLAINT:  Cardiac arrest   Brief History   68 yoF with known severe CAD, recent admit for acute on chronic diastolic HF, presenting with worsening SOB overnight since yesterday.  Diuresed in ER and about to be discharged home when found unresponsive on BSC, in Vfib w/ defib x 3, epi x 2 and amiodarone, with ROSC after 15 mins.  Unclear down time prior to being found.  EKG noted inferior STEMI, transferred emergently to [facility] cath lab which revealed re-stenosis of RCA stent, s/p angioplasty. Started TTM 4/9.  History of present illness   HPI obtained from medical chart review as patient is intubated and sedated on MV.   68 year old female with extensive past medical history presenting with shortness of breath since yesterday and ongoing lower extremity swelling.    Recently hospitalized 3/23 - 4/1 with acute on chronic diastolic HF with cardiology workup with known severe disease of distal left main and circumflex, not a surgical candidate and poor candidate for further interventional options with suggested medical management.    In the ER, she was afebrile, normotensive and 100% on room air.  Labs noted for BNP 145, trop 0.12- 0.11, WBC 11.3, Hgb 9.4 (stable), and CXR showing mild pulmonary edema.  She was being diuresed in the ER with 700 ml diuresis and ambulated at 98% with improved but residual shortness of breath.  She was pending discharge home, but then was found unresponsive on bedside commode.  CPR initiated, defibrillated x 3 for vFib with epi and amiodarone with ROSC after 15 mins.  Unknown down time prior to being found.  She was intubated and placed on amiodarone drip and heparin.  EKG concerning for STE in inferior leads.  A code STEMI was called and patient emergently transferred to [facility] cath lab.  PCCM to accept.  Unclear  mental status post arrest/ cooling not started.   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, CAD with severe known disease of distal left main and circumflex, hx MI, CKD stage III -IV, HTN, HLD, DM type 2, asthma, obesity, anxiety, osteoarthritis, chronic back pain, diabetic peripheral neuropathy, fibromyalgia, GERD, hypothyroidism, migraine headaches, occipital CVA,  vitamin D deficiency  Significant Hospital Events   4/9 Admitted/ cardiac arrest   Consults:  Cards   Procedures:  4/9 LHC >> s/p DES to RCA/ right radial TR band 4/9 ETT >>exchanged w/o issue 4/9>> 4/9 R femoral single lumen CVL >>4/9 4/9 R femoral triple lumen>> 4/9 A line>>  Significant Diagnostic Tests:  12/09/2018 TTE >> LVEF 55-60%, moderate LVH, incoordinate septal motion, grade 2 DD, high LV filling pressure, moderate aortic stenosis, RV pacer, MAC with mild to moderate MR, mild LAE, moderate RAE, moderate TR, RVSP 62 mmHg, dilated IVC  Micro Data:   Antimicrobials:   Interim history/subjective:  Cooling protocol. Bedside u/s with reduced EF.  Objective   Blood pressure 118/68, pulse 60, temperature (!) 90.7 F (32.6 C), resp. rate 18, height _0  (1.651 m), weight 120.3 kg, SpO2 100 %. CVP:  [0 mmHg-19 mmHg] 11 mmHg  Vent Mode: PRVC FiO2 (%):  [40 %-100 %] 40 % Set Rate:  [18 bmp] 18 bmp Vt Set:  [450 mL-550 mL] 550 mL PEEP:  [5 cmH20] 5 cmH20 Plateau Pressure:  [11 cmH20-32 cmH20] 19 cmH20  Intake/Output Summary (Last 24 hours) at 12/26/2018 5697 Last data filed at 12/26/2018 0800 Gross per 24 hour  Intake 2097.22 ml  Output 1455 ml  Net 642.22 ml   Filed Weights   01/07/2019 0729 01/12/2019 0803  Weight: 117.9 kg 120.3 kg   Examination: General:  Morbidly obese, critically ill adult female on MV HEENT: MM pink/moist, ETT, pupils 3/sluggish, anicteric  Neuro: unresponsive CV: paced, no murmur PULM: even/non-labored, not breathing  over set rate; lungs clear bilaterally though limited exam due to cooling pads GI: soft, +BS Extremities: cool/dry, no LE edema Skin: no rashes  Resolved Hospital Problem list    Assessment & Plan:   Vfib cardiac arrest with inferior STEMI Known severe CAD, previously not a surgical candidate; S/p LHC with DES to RCA, aggrestat. Troponin peaked at 2.05 P:  Appreciate cardiology input On amiodarone drip  Daily ASA, brilliinta Wean norepi as able May need ICD for secondary prevention of ventricular arrhythmia Hold home metoprolol/losartan 2/2 hypotension Tele monitoring  TTE after re-warming W/ TTM 33 protocol, RASS goal -4/-5 with fentanyl/ versed/ nimbex   Acute encephalopathy s/p cardiac arrest  EEG reveals burst suppression; done while on sedation and nimbex  P:  On arrival to ICU, patient remains unresponsive, no reported purposeful activity in cath lab- some chewing on ETT TTM 33 per protocol, with sedation and paralytics   Acute respiratory failure in the setting of cardiac arrest Mild pulmonary edema S/p diuresis in ER prior to arrest.   P:  Full MV support, PRVC 8 cc/kg, rate 15  VAP bundle duoneb prn   Acute on chronic diastolic HF - discharge wt 4/1 was 260 lbs P:  Per cards Hold home lasix   Hx symptomatic 2nd degree heart block s/p AV PPM 06/2018 - followed by Dr. Rayann Heman, paced at 63 currently P:  Tele monitor  HLD P:  Restart home rosuvastatin 40 mg daily   AKI on CKD stage III-IV in setting of cardiac arrest (baseline sCr 1.25- 1.99) Anion gap metabolic acidosis in setting of cardiac arrest, AKI P:  S/p diuresis in ER with over 700 ml UOP BMP per TTM protocol Trend UOP/ daily wt/ renal panel/ mag Renal dose meds   DM type 2, insulin dependent P:  Insulin drip for now   Hypothyroidism  P:  Continue home synthroid 50 mcg daily   Normocytic Anemia- baseline Hgb ~9 P:  Trend CBC  Anxiety P:  Hold home alprazolam   GERD P:  PPI    Best practice:  Diet: NPO Pain/Anxiety/Delirium protocol (if indicated): fentanyl /propofol/ nimbex  VAP protocol (if indicated): yes DVT prophylaxis: SCDs/heparin GI prophylaxis: PPI Glucose control: insulin gtt  Mobility: BR Code Status: Full  Family Communication: no family at bedside.  Disposition: ICU  Labs   CBC: Recent Labs  Lab 12/21/2018 0740 01/10/2019 2012 01/08/2019 2200 12/26/18 0039 12/26/18 0415  WBC 11.3*  --  31.0* 23.0* 22.9*  NEUTROABS 7.9*  --   --   --   --   HGB 9.4* 9.9* 9.6* 9.1* 8.6*  HCT 31.3* 29.0* 32.2* 30.2* 29.1*  MCV 84.4  --  83.9 83.9 83.1  PLT 237  --  350 282 948    Basic Metabolic Panel: Recent Labs  Lab 01/02/2019 2004 12/28/2018 2012 12/21/2018 2200 12/26/18 0039 12/26/18 0256 12/26/18 0415  NA 135 135 135 135 137 137  K 4.9 4.8 4.6 3.4* 3.0* 3.1*  CL 96*  --  97* 97* 99 99  CO2  20*  --  18* 15* 16* 18*  GLUCOSE 408*  --  448* 390* 302* 245*  BUN 56*  --  57* 58* 58* 56*  CREATININE 2.24*  --  2.25* 2.31* 2.19* 2.31*  CALCIUM 9.1  --  9.1 9.2 8.9 9.0  MG 1.9  --   --   --   --  1.9  PHOS 6.2*  --   --   --   --   --    GFR: Estimated Creatinine Clearance: 30.7 mL/min (A) (by C-G formula based on SCr of 2.31 mg/dL (H)). Recent Labs  Lab 01/08/2019 0740 01/10/2019 2200 12/26/18 0039 12/26/18 0415  WBC 11.3* 31.0* 23.0* 22.9*    Liver Function Tests: Recent Labs  Lab 12/24/2018 0740 12/26/18 0039  AST 18 67*  ALT 14 48*  ALKPHOS 99 115  BILITOT 0.5 1.5*  PROT 7.5 6.2*  ALBUMIN 3.4* 3.0*   No results for input(s): LIPASE, AMYLASE in the last 168 hours. No results for input(s): AMMONIA in the last 168 hours.  ABG    Component Value Date/Time   PHART 7.386 12/17/2018 2012   PCO2ART 41.2 01/07/2019 2012   PO2ART 403.0 (H) 01/05/2019 2012   HCO3 24.7 01/04/2019 2012   TCO2 26 01/14/2019 2012   O2SAT 100.0 01/12/2019 2012     Coagulation Profile: Recent Labs  Lab 12/20/2018 2004 12/26/18 0415  INR 1.3* 1.2     Cardiac Enzymes: Recent Labs  Lab 12/19/2018 0740 12/23/2018 1156 12/31/2018 2004 12/26/18 0039 12/26/18 0256  TROPONINI 0.12* 0.11* 1.16* 2.05* 1.84*    HbA1C: HB A1C (BAYER DCA - WAIVED)  Date/Time Value Ref Range Status  10/27/2018 11:33 AM 8.8 (H) <7.0 % Final    Comment:                                          Diabetic Adult            <7.0                                       Healthy Adult        4.3 - 5.7                                                           (DCCT/NGSP) American Diabetes Association's Summary of Glycemic Recommendations for Adults with Diabetes: Hemoglobin A1c <7.0%. More stringent glycemic goals (A1c <6.0%) may further reduce complications at the cost of increased risk of hypoglycemia.   02/25/2018 10:33 AM 8.3 (H) <7.0 % Final    Comment:                                          Diabetic Adult            <7.0  Healthy Adult        4.3 - 5.7                                                           (DCCT/NGSP) American Diabetes Association's Summary of Glycemic Recommendations for Adults with Diabetes: Hemoglobin A1c <7.0%. More stringent glycemic goals (A1c <6.0%) may further reduce complications at the cost of increased risk of hypoglycemia.    Hgb A1c MFr Bld  Date/Time Value Ref Range Status  06/05/2018 07:47 PM 7.3 (H) 4.8 - 5.6 % Final    Comment:    (NOTE) Pre diabetes:          5.7%-6.4% Diabetes:              >6.4% Glycemic control for   <7.0% adults with diabetes     CBG: Recent Labs  Lab 12/26/18 0258 12/26/18 0401 12/26/18 0503 12/26/18 0555 12/26/18 0653  GLUCAP 279* 245* 215* 179* 156*    Review of Systems:   Unable as patient is intubated.   Past Medical History  She,  has a past medical history of Anxiety, Arthritis, Asthma, Chronic lower back pain, CKD (chronic kidney disease) stage 3, GFR 30-59 ml/min (HCC), Coronary artery disease, Diabetic peripheral neuropathy ([place]),  Diastolic heart failure ([place]), Fibromyalgia, GERD (gastroesophageal reflux disease), Hyperlipidemia, Hypertension, Hypothyroid, Migraine, Mobitz type 2 second degree heart block, NSTEMI (non-ST elevated myocardial infarction) ([place]), Obesity, Pneumonia, Seasonal allergies, Stroke ([place]) (05/2018), Type II diabetes mellitus ([place]), and Vitamin D deficiency.   Surgical History    Past Surgical History:  Procedure Laterality Date   [place]?   BREAST BIOPSY Left 1970s   BREAST LUMPECTOMY Left 1970s   CESAREAN SECTION  1984   CORONARY ATHERECTOMY N/A 06/11/2018   Procedure: CORONARY ATHERECTOMY;  Surgeon: Martinique, Peter M, MD;  Location: [place] [facility];  Service: Cardiovascular;  Laterality: N/A;   CORONARY STENT INTERVENTION N/A 06/11/2018   Procedure: CORONARY STENT INTERVENTION;  Surgeon: Martinique, Peter M, MD;  Location: [place] [facility];  Service: Cardiovascular;  Laterality: N/A;   CORONARY/GRAFT ACUTE MI REVASCULARIZATION N/A 01/12/2019   Procedure: CORONARY/GRAFT ACUTE MI REVASCULARIZATION;  Surgeon: Belva Crome, MD;  Location: [place] [facility];  Service: Cardiovascular;  Laterality: N/A;   LEFT HEART CATH AND CORONARY ANGIOGRAPHY N/A 06/06/2018   Procedure: LEFT HEART CATH AND CORONARY ANGIOGRAPHY;  Surgeon: Troy Sine, MD;  Location: [place] [facility];  Service: Cardiovascular;  Laterality: N/A;   LEFT HEART CATH AND CORONARY ANGIOGRAPHY N/A 12/21/2018   Procedure: LEFT HEART CATH AND CORONARY ANGIOGRAPHY;  Surgeon: Belva Crome, MD;  Location: [place] [facility];  Service: Cardiovascular;  Laterality: N/A;   PACEMAKER IMPLANT N/A 06/27/2018   MDT Azure XT MRI with 7096 His lead implanted by Dr 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   VAGINAL HYSTERECTOMY  1997     Social History   reports that she has never smoked. She has never used smokeless tobacco. She reports that she does not drink alcohol or use drugs.   Family History   Her family  history includes Asthma in her sister, sister, and sister; Breast cancer in her sister; Colon cancer in her mother; Dementia in her mother; Diabetes in her mother and sister; Heart  disease in her father; Hypertension in her mother; Scoliosis in her sister.   Allergies Allergies  Allergen Reactions   Atorvastatin Other (See Comments)    Leg weakness.   Rosuvastatin Other (See Comments)    Leg weakness      Home Medications  Prior to Admission medications   Medication Sig Start Date End Date Taking? Authorizing Provider  acyclovir (ZOVIRAX) 400 MG tablet TAKE 1 TABLET EVERY DAY Patient taking differently: Take 400 mg by mouth daily.  10/22/18  Yes Hawks, Christy A, FNP  albuterol (PROVENTIL HFA;VENTOLIN HFA) 108 (90 Base) MCG/ACT inhaler Inhale 1-2 puffs into the lungs every 6 (six) hours as needed for wheezing or shortness of breath.   Yes [provider]  ALPRAZolam Duanne Moron) 0.5 MG tablet Take 1 tablet (0.5 mg total) by mouth at bedtime as needed for anxiety. TAKE 1 TABLET 2 TIMES A DAY AS NEEDED FOR ANXIETY 10/27/18  Yes Hawks, Christy A, FNP  aspirin EC 81 MG EC tablet Take 1 tablet (81 mg total) by mouth daily. 06/17/18  Yes Ghimire, Henreitta Leber, MD  blood glucose meter kit and supplies KIT Dispense based on patient and insurance preference. Use up to four times daily as directed. (FOR ICD-9 250.00, 250.01). 10/28/18  Yes Hawks, Christy A, FNP  Cholecalciferol (VITAMIN D) 50 MCG (2000 UT) tablet Take 2,000 Units by mouth daily.   Yes [provider]  clopidogrel (PLAVIX) 75 MG tablet Take 1 tablet (75 mg total) by mouth daily. 11/20/18  Yes Hawks, Christy A, FNP  ferrous sulfate 325 (65 FE) MG EC tablet Take 1 tablet (325 mg total) by mouth daily with breakfast. 10/27/18  Yes Hawks, Christy A, FNP  furosemide (LASIX) 80 MG tablet Take 1 tablet (80 mg total) by mouth daily. 12/18/18  Yes Florencia Reasons, MD  gabapentin (NEURONTIN) 100 MG capsule Take 1 capsule (100 mg total) by mouth 3  (three) times daily. 11/20/18  Yes Hawks, Alyse Low A, FNP  glucose blood (ACCU-CHEK AVIVA PLUS) test strip Test BS QID E11.65 11/24/18  Yes Hawks, Christy A, FNP  HYDROcodone-acetaminophen (NORCO) 7.5-325 MG tablet Take 1 tablet by mouth every 12 (twelve) hours as needed for moderate pain. Patient taking differently: Take 1 tablet by mouth at bedtime.  10/27/18  Yes Hawks, Christy A, FNP  insulin aspart (NOVOLOG) 100 UNIT/ML injection CBG < 70: implement hypoglycemia protocol CBG 70 - 120: 0 units CBG 121 - 150: 0 units CBG 151 - 200: 0 units CBG 201 - 250: 2 units CBG 251 - 300: 3 units CBG 301 - 350: 4 units CBG 351 - 400: 5 units CBG > 400: call MD 06/16/18  Yes Ghimire, Henreitta Leber, MD  Insulin Pen Needle (DROPLET PEN NEEDLES) 31G X 5 MM MISC Use to give Lantus BID Dx E11.65 12/05/18  Yes Hawks, Christy A, FNP  Ipratropium-Albuterol (COMBIVENT RESPIMAT) 20-100 MCG/ACT AERS respimat INHALE 1 PUFF INTO THE LUNGS EVERY 6 (SIX) HOURS AS NEEDED FOR WHEEZING. Patient taking differently: Inhale 1 puff into the lungs every 6 (six) hours as needed for wheezing or shortness of breath.  10/27/18  Yes Hawks, Christy A, FNP  LANTUS SOLOSTAR 100 UNIT/ML Solostar Pen INJECT 28 UNITS INTO THE SKIN 2 (TWO) TIMES DAILY. 12/23/18  Yes Hawks, Christy A, FNP  levothyroxine (SYNTHROID, LEVOTHROID) 50 MCG tablet TAKE 1 TABLET EVERY DAY BEFORE BREAKFAST Patient taking differently: Take 50 mcg by mouth daily before breakfast.  11/20/18  Yes Hawks, Christy A, FNP  losartan (COZAAR) 25  MG tablet Take 1 tablet (25 mg total) by mouth daily. 12/18/18  Yes Florencia Reasons, MD  metFORMIN (GLUCOPHAGE) 1000 MG tablet Take 1 tablet (1,000 mg total) by mouth 2 (two) times daily with a meal. 10/27/18  Yes Hawks, Christy A, FNP  metoprolol succinate (TOPROL-XL) 25 MG 24 hr tablet TAKE 1 TABLET EVERY DAY 12/23/18  Yes Hawks, Christy A, FNP  montelukast (SINGULAIR) 10 MG tablet Take 1 tablet (10 mg total) by mouth at bedtime. 10/22/18  Yes Hawks, Christy A,  FNP  nitroGLYCERIN (NITROSTAT) 0.4 MG SL tablet Place 1 tablet (0.4 mg total) under the tongue every 5 (five) minutes x 3 doses as needed for chest pain. 12/17/18  Yes Florencia Reasons, MD  ondansetron (ZOFRAN) 4 MG tablet Take 4 mg by mouth every 8 (eight) hours as needed for nausea or vomiting.  10/27/18  Yes [provider]  pantoprazole (PROTONIX) 40 MG tablet Take 1 tablet (40 mg total) by mouth daily. 10/27/18  Yes Hawks, Christy A, FNP  polyethylene glycol (MIRALAX / GLYCOLAX) packet Take 17 g by mouth daily. 12/18/18  Yes Florencia Reasons, MD  rosuvastatin (CRESTOR) 40 MG tablet Take 1 tablet (40 mg total) by mouth daily. 10/27/18  Yes Hawks, Christy A, FNP  vitamin C (ASCORBIC ACID) 500 MG tablet Take 500 mg by mouth daily.   Yes [provider]  Accu-Chek Softclix Lancets lancets Test Blood glucose three times daily Dx: E11.9 12/23/18   [name], FNP     Alphonzo Grieve, MD PGY3 Pager [phone] 12/26/2018, 8:52 AM

## 2018-12-27 ENCOUNTER — Inpatient Hospital Stay (HOSPITAL_COMMUNITY): Payer: Medicare HMO

## 2018-12-27 DIAGNOSIS — I639 Cerebral infarction, unspecified: Secondary | ICD-10-CM

## 2018-12-27 LAB — BASIC METABOLIC PANEL
Anion gap: 21 — ABNORMAL HIGH (ref 5–15)
Anion gap: 21 — ABNORMAL HIGH (ref 5–15)
Anion gap: 21 — ABNORMAL HIGH (ref 5–15)
Anion gap: 20 — ABNORMAL HIGH (ref 5–15)
Anion gap: 18 — ABNORMAL HIGH (ref 5–15)
Anion gap: 19 — ABNORMAL HIGH (ref 5–15)
Anion gap: 18 — ABNORMAL HIGH (ref 5–15)
BUN: 51 mg/dL — ABNORMAL HIGH (ref 8–23)
BUN: 51 mg/dL — ABNORMAL HIGH (ref 8–23)
BUN: 50 mg/dL — ABNORMAL HIGH (ref 8–23)
BUN: 51 mg/dL — ABNORMAL HIGH (ref 8–23)
BUN: 49 mg/dL — ABNORMAL HIGH (ref 8–23)
BUN: 50 mg/dL — ABNORMAL HIGH (ref 8–23)
BUN: 49 mg/dL — ABNORMAL HIGH (ref 8–23)
CO2: 15 mmol/L — ABNORMAL LOW (ref 22–32)
CO2: 15 mmol/L — ABNORMAL LOW (ref 22–32)
CO2: 15 mmol/L — ABNORMAL LOW (ref 22–32)
CO2: 14 mmol/L — ABNORMAL LOW (ref 22–32)
CO2: 15 mmol/L — ABNORMAL LOW (ref 22–32)
CO2: 15 mmol/L — ABNORMAL LOW (ref 22–32)
CO2: 16 mmol/L — ABNORMAL LOW (ref 22–32)
Calcium: 8.8 mg/dL — ABNORMAL LOW (ref 8.9–10.3)
Calcium: 8.8 mg/dL — ABNORMAL LOW (ref 8.9–10.3)
Calcium: 8.7 mg/dL — ABNORMAL LOW (ref 8.9–10.3)
Calcium: 8.6 mg/dL — ABNORMAL LOW (ref 8.9–10.3)
Calcium: 8.5 mg/dL — ABNORMAL LOW (ref 8.9–10.3)
Calcium: 8.4 mg/dL — ABNORMAL LOW (ref 8.9–10.3)
Calcium: 8.3 mg/dL — ABNORMAL LOW (ref 8.9–10.3)
Chloride: 101 mmol/L (ref 98–111)
Chloride: 101 mmol/L (ref 98–111)
Chloride: 100 mmol/L (ref 98–111)
Chloride: 103 mmol/L (ref 98–111)
Chloride: 103 mmol/L (ref 98–111)
Chloride: 102 mmol/L (ref 98–111)
Chloride: 102 mmol/L (ref 98–111)
Creatinine, Ser: 1.56 mg/dL — ABNORMAL HIGH (ref 0.44–1.00)
Creatinine, Ser: 2.25 mg/dL — ABNORMAL HIGH (ref 0.44–1.00)
Creatinine, Ser: 2.31 mg/dL — ABNORMAL HIGH (ref 0.44–1.00)
Creatinine, Ser: 2.31 mg/dL — ABNORMAL HIGH (ref 0.44–1.00)
Creatinine, Ser: 2.26 mg/dL — ABNORMAL HIGH (ref 0.44–1.00)
Creatinine, Ser: 2.43 mg/dL — ABNORMAL HIGH (ref 0.44–1.00)
Creatinine, Ser: 2.56 mg/dL — ABNORMAL HIGH (ref 0.44–1.00)
Creatinine, Ser: 2.58 mg/dL — ABNORMAL HIGH (ref 0.44–1.00)
GFR calc Af Amer: 39 mL/min — ABNORMAL LOW (ref >60)
GFR calc Af Amer: 25 mL/min — ABNORMAL LOW (ref >60)
GFR calc Af Amer: 25 mL/min — ABNORMAL LOW (ref >60)
GFR calc Af Amer: 25 mL/min — ABNORMAL LOW (ref >60)
GFR calc Af Amer: 25 mL/min — ABNORMAL LOW (ref >60)
GFR calc Af Amer: 23 mL/min — ABNORMAL LOW (ref >60)
GFR calc Af Amer: 22 mL/min — ABNORMAL LOW (ref >60)
GFR calc Af Amer: 21 mL/min — ABNORMAL LOW (ref >60)
GFR calc non Af Amer: 34 mL/min — ABNORMAL LOW (ref >60)
GFR calc non Af Amer: 22 mL/min — ABNORMAL LOW (ref >60)
GFR calc non Af Amer: 21 mL/min — ABNORMAL LOW (ref >60)
GFR calc non Af Amer: 21 mL/min — ABNORMAL LOW (ref >60)
GFR calc non Af Amer: 22 mL/min — ABNORMAL LOW (ref >60)
GFR calc non Af Amer: 20 mL/min — ABNORMAL LOW (ref >60)
GFR calc non Af Amer: 19 mL/min — ABNORMAL LOW (ref >60)
GFR calc non Af Amer: 19 mL/min — ABNORMAL LOW (ref >60)
Glucose, Bld: 145 mg/dL — ABNORMAL HIGH (ref 70–99)
Glucose, Bld: 212 mg/dL — ABNORMAL HIGH (ref 70–99)
Glucose, Bld: 236 mg/dL — ABNORMAL HIGH (ref 70–99)
Glucose, Bld: 261 mg/dL — ABNORMAL HIGH (ref 70–99)
Glucose, Bld: 280 mg/dL — ABNORMAL HIGH (ref 70–99)
Glucose, Bld: 289 mg/dL — ABNORMAL HIGH (ref 70–99)
Glucose, Bld: 301 mg/dL — ABNORMAL HIGH (ref 70–99)
Glucose, Bld: 301 mg/dL — ABNORMAL HIGH (ref 70–99)
Potassium: 3.4 mmol/L — ABNORMAL LOW (ref 3.5–5.1)
Potassium: 3.6 mmol/L (ref 3.5–5.1)
Potassium: 3.8 mmol/L (ref 3.5–5.1)
Potassium: 4 mmol/L (ref 3.5–5.1)
Potassium: 4.1 mmol/L (ref 3.5–5.1)
Potassium: 4.3 mmol/L (ref 3.5–5.1)
Potassium: 4.4 mmol/L (ref 3.5–5.1)
Sodium: 137 mmol/L (ref 135–145)
Sodium: 137 mmol/L (ref 135–145)
Sodium: 136 mmol/L (ref 135–145)
Sodium: 137 mmol/L (ref 135–145)
Sodium: 136 mmol/L (ref 135–145)
Sodium: 136 mmol/L (ref 135–145)
Sodium: 136 mmol/L (ref 135–145)

## 2018-12-27 LAB — GLUCOSE, CAPILLARY
Glucose-Capillary: 183 mg/dL — ABNORMAL HIGH (ref 70–99)
Glucose-Capillary: 190 mg/dL — ABNORMAL HIGH (ref 70–99)
Glucose-Capillary: 204 mg/dL — ABNORMAL HIGH (ref 70–99)
Glucose-Capillary: 237 mg/dL — ABNORMAL HIGH (ref 70–99)
Glucose-Capillary: 241 mg/dL — ABNORMAL HIGH (ref 70–99)
Glucose-Capillary: 260 mg/dL — ABNORMAL HIGH (ref 70–99)
Glucose-Capillary: 273 mg/dL — ABNORMAL HIGH (ref 70–99)
Glucose-Capillary: 277 mg/dL — ABNORMAL HIGH (ref 70–99)
Glucose-Capillary: 282 mg/dL — ABNORMAL HIGH (ref 70–99)
Glucose-Capillary: 287 mg/dL — ABNORMAL HIGH (ref 70–99)
Glucose-Capillary: 303 mg/dL — ABNORMAL HIGH (ref 70–99)
Glucose-Capillary: 313 mg/dL — ABNORMAL HIGH (ref 70–99)

## 2018-12-27 LAB — CBC
HCT: 28.1 % — ABNORMAL LOW (ref 36.0–46.0)
Hemoglobin: 8.7 g/dL — ABNORMAL LOW (ref 12.0–15.0)
MCH: 25.7 pg — ABNORMAL LOW (ref 26.0–34.0)
MCHC: 31 g/dL (ref 30.0–36.0)
MCV: 82.9 fL (ref 80.0–100.0)
Platelets: 280 10*3/uL (ref 150–400)
RBC: 3.39 MIL/uL — ABNORMAL LOW (ref 3.87–5.11)
RDW: 17.1 % — ABNORMAL HIGH (ref 11.5–15.5)
WBC: 16.7 10*3/uL — ABNORMAL HIGH (ref 4.0–10.5)
nRBC: 0 % (ref 0.0–0.2)

## 2018-12-27 LAB — RENAL FUNCTION PANEL
Albumin: 2.6 g/dL — ABNORMAL LOW (ref 3.5–5.0)
Anion gap: 19 — ABNORMAL HIGH (ref 5–15)
BUN: 51 mg/dL — ABNORMAL HIGH (ref 8–23)
CO2: 16 mmol/L — ABNORMAL LOW (ref 22–32)
Calcium: 8.8 mg/dL — ABNORMAL LOW (ref 8.9–10.3)
Chloride: 102 mmol/L (ref 98–111)
Creatinine, Ser: 2.15 mg/dL — ABNORMAL HIGH (ref 0.44–1.00)
GFR calc Af Amer: 27 mL/min — ABNORMAL LOW (ref >60)
GFR calc non Af Amer: 23 mL/min — ABNORMAL LOW (ref >60)
Glucose, Bld: 199 mg/dL — ABNORMAL HIGH (ref 70–99)
Phosphorus: 4.6 mg/dL (ref 2.5–4.6)
Potassium: 3.5 mmol/L (ref 3.5–5.1)
Sodium: 137 mmol/L (ref 135–145)

## 2018-12-27 LAB — HEPATIC FUNCTION PANEL
Albumin: 1.9 g/dL — ABNORMAL LOW (ref 3.5–5.0)
Total Protein: 3.9 g/dL — ABNORMAL LOW (ref 6.5–8.1)

## 2018-12-27 LAB — HEPARIN LEVEL (UNFRACTIONATED): Heparin Unfractionated: <0.1 IU/mL — ABNORMAL LOW (ref 0.30–0.70)

## 2018-12-27 MED ORDER — VITAL HIGH PROTEIN PO LIQD
1000.0000 mL | ORAL | Status: AC
  Administered 2018-12-27: 1000 mL

## 2018-12-27 MED ORDER — SODIUM CHLORIDE 0.9 % IV SOLN
INTRAVENOUS | Status: DC | PRN
  Administered 2018-12-31 – 2019-01-03 (×2): via INTRAVENOUS

## 2018-12-27 MED ORDER — INSULIN REGULAR(HUMAN) IN NACL 100-0.9 UT/100ML-% IV SOLN
INTRAVENOUS | Status: DC
  Administered 2018-12-27: 2.2 [IU]/h via INTRAVENOUS
  Administered 2018-12-28: 11.6 [IU]/h via INTRAVENOUS
  Filled 2018-12-27 (×2): qty 100

## 2018-12-27 MED ORDER — OSMOLITE 1.5 CAL PO LIQD: 1000.0000 mL | ORAL | Status: DC

## 2018-12-27 MED ORDER — LEVETIRACETAM IN NACL 500 MG/100ML IV SOLN
500.0000 mg | Freq: Two times a day (BID) | INTRAVENOUS | Status: DC
  Administered 2018-12-28: 06:00:00 500 mg via INTRAVENOUS
  Filled 2018-12-27: qty 100

## 2018-12-27 MED ORDER — INSULIN ASPART 100 UNIT/ML ~~LOC~~ SOLN
2.0000 [IU] | SUBCUTANEOUS | Status: DC
  Administered 2018-12-27: 6 [IU] via SUBCUTANEOUS

## 2018-12-27 MED ORDER — VITAL HIGH PROTEIN PO LIQD
1000.0000 mL | ORAL | Status: DC
  Administered 2018-12-27: 1000 mL

## 2018-12-27 MED ORDER — INSULIN DETEMIR 100 UNIT/ML ~~LOC~~ SOLN
10.0000 [IU] | Freq: Two times a day (BID) | SUBCUTANEOUS | Status: DC
  Filled 2018-12-27 (×2): qty 0.1

## 2018-12-27 MED ORDER — PRO-STAT SUGAR FREE PO LIQD
60.0000 mL | Freq: Four times a day (QID) | ORAL | Status: DC
  Administered 2018-12-27 – 2018-12-29 (×4): 60 mL
  Filled 2018-12-27 (×4): qty 60

## 2018-12-27 MED ORDER — ASPIRIN 81 MG PO CHEW
81.0000 mg | CHEWABLE_TABLET | Freq: Every day | ORAL | Status: DC
  Administered 2018-12-27 – 2019-01-03 (×8): 81 mg via ORAL
  Filled 2018-12-27 (×8): qty 1

## 2018-12-27 MED ORDER — INSULIN GLARGINE 100 UNIT/ML ~~LOC~~ SOLN: 5.0000 [IU] | Freq: Two times a day (BID) | SUBCUTANEOUS | Status: DC

## 2018-12-27 MED ORDER — LEVETIRACETAM IN NACL 1500 MG/100ML IV SOLN
1500.0000 mg | INTRAVENOUS | Status: AC
  Administered 2018-12-27: 1500 mg via INTRAVENOUS
  Filled 2018-12-27: qty 100

## 2018-12-27 MED ORDER — OSMOLITE 1.5 CAL PO LIQD
1000.0000 mL | ORAL | Status: DC
  Filled 2018-12-27 (×2): qty 1000

## 2018-12-27 NOTE — Progress Notes (Signed)
Spoke to [employer] @ [phone]. Referral # [id number]. TTM status dhas been documented. Irven [place] did advise that an Scientist, research (life sciences) would return call d/t Patient remains on vetn / TTM.

## 2018-12-27 NOTE — Progress Notes (Signed)
Prelim EEG review negative for seizures or status. Formal read pending and will be done in AM after overnight recording is completed.  Neurology will continue to follow.  Amie [place], MD Neurology
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
## 2018-12-27 NOTE — Progress Notes (Signed)
Nutrition Follow-up  DOCUMENTATION CODES:   Morbid obesity  INTERVENTION:   Finish current Liter bottle:  -Vital High Protein @ 20 ml/hr (480 ml) via OGT -Add Prostat 60 ml QID  Provides: 1280 kcal (2233 kcal with propofol), 162 grams of protein, and 401 ml free water.   Once Liter bottle runs out initiate:  - Osmolite 1.5 @ 20 ml/hr (480 ml/day) via OGT - Pro-stat 60 ml QID  Provides: 1520 kcals (2663 kcal with propofol), 150 grams protein, 366 ml free water. Given high rate of propofol, unable to meet pt's protein needs without exceeding kcal needs.  NUTRITION DIAGNOSIS:   Inadequate oral intake related to inability to eat as evidenced by NPO status.  Ongoing  GOAL:   Provide needs based on ASPEN/SCCM guidelines  Addressed via TF  MONITOR:   Vent status, Weight trends, I & O's, Labs  REASON FOR ASSESSMENT:   Ventilator    ASSESSMENT:   68 year old female who presented to the [facility] ED on 4/09 with SOB. PMH of CHF, severe CAD s/p stenting, IDDM, hypothyroidism, CKD stage III, HLD, HTN. Pt found unresponsive on BSC and intubated then transferred to [facility] s/p Vfib arrest with defib x 3, epi x 2 and amiodarone with ROSC after 15 minutes. EKG noted inferior STEMI. S/p cath lab with lot seen on previous RCA stent. Pt admitted to the ICU and started on TTM on 4/09.   RD working remotely.  Pt rewarmed. Nimbex d/c. Spoke with RN via phone. Propofol being weaned today. The tube feeding protocol was put in by CCM this am, Vital High Protein started @ 20 ml/hr + 30 ml Prostat. Will let liter bottle of VHP finish and change to Osmolite 1.5 to preserve ICU formulas that are in national shortage.   Weight noted to increase from 120.3 kg yesterday to 122.5 kg today. Will continue to monitor trends.   Patient is currently intubated on ventilator support MV: 9.4 L/min Temp (24hrs), Avg:93.4 F (34.1 C), Min:90.7 F (32.6 C), Max:97.7 F (36.5 C)  Propofol: 36.1 ml/hr- provides  953 kcal   I/O: +2383 ml since admit UOP: 945 ml x 24 hrs  OGT: 300 ml x 24 hrs   Drips: amiodarone, fentanyl, levophed, propofol  Medications: SS novolog, Levemir,  Labs: CBG [id number]  Diet Order:   Diet Order            Diet NPO time specified  Diet effective now              EDUCATION NEEDS:   Not appropriate for education at this time  Skin:  Skin Assessment: Reviewed RN Assessment  Last BM:  PTA/unknown  Height:   Ht Readings from Last 1 Encounters:  01/11/2019 5\' 5"  (1.651 m)    Weight:   Wt Readings from Last 1 Encounters:  12/27/18 122.5 kg    Ideal Body Weight:  56.8 kg  BMI:  Body mass index is 44.94 kg/m.  Estimated Nutritional Needs:   Kcal:  1761-6073  Protein:  >/= 142 grams  Fluid:  >/= 1.5 L  Mariana Single RD, LDN Clinical Nutrition Pager # [phone]
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
## 2018-12-27 NOTE — Progress Notes (Signed)
LTM EEG started 

## 2018-12-27 NOTE — Progress Notes (Signed)
Spoke with Vira Agar with [facility]. After review of Patient's IP chart, age and medical Hx. Patient is not a candidate for donation. However, if family requests brain death testing [facility] will reconsider based on results. Otherwise, call with TOD. Referral [phone]

## 2018-12-28 ENCOUNTER — Inpatient Hospital Stay (HOSPITAL_COMMUNITY): Payer: Medicare HMO

## 2018-12-28 LAB — GLUCOSE, CAPILLARY
Glucose-Capillary: 110 mg/dL — ABNORMAL HIGH (ref 70–99)
Glucose-Capillary: 112 mg/dL — ABNORMAL HIGH (ref 70–99)
Glucose-Capillary: 117 mg/dL — ABNORMAL HIGH (ref 70–99)
Glucose-Capillary: 132 mg/dL — ABNORMAL HIGH (ref 70–99)
Glucose-Capillary: 136 mg/dL — ABNORMAL HIGH (ref 70–99)
Glucose-Capillary: 141 mg/dL — ABNORMAL HIGH (ref 70–99)
Glucose-Capillary: 144 mg/dL — ABNORMAL HIGH (ref 70–99)
Glucose-Capillary: 152 mg/dL — ABNORMAL HIGH (ref 70–99)
Glucose-Capillary: 153 mg/dL — ABNORMAL HIGH (ref 70–99)
Glucose-Capillary: 153 mg/dL — ABNORMAL HIGH (ref 70–99)
Glucose-Capillary: 153 mg/dL — ABNORMAL HIGH (ref 70–99)
Glucose-Capillary: 154 mg/dL — ABNORMAL HIGH (ref 70–99)
Glucose-Capillary: 157 mg/dL — ABNORMAL HIGH (ref 70–99)
Glucose-Capillary: 161 mg/dL — ABNORMAL HIGH (ref 70–99)
Glucose-Capillary: 166 mg/dL — ABNORMAL HIGH (ref 70–99)
Glucose-Capillary: 167 mg/dL — ABNORMAL HIGH (ref 70–99)
Glucose-Capillary: 171 mg/dL — ABNORMAL HIGH (ref 70–99)
Glucose-Capillary: 175 mg/dL — ABNORMAL HIGH (ref 70–99)
Glucose-Capillary: 182 mg/dL — ABNORMAL HIGH (ref 70–99)
Glucose-Capillary: 189 mg/dL — ABNORMAL HIGH (ref 70–99)
Glucose-Capillary: 191 mg/dL — ABNORMAL HIGH (ref 70–99)
Glucose-Capillary: 212 mg/dL — ABNORMAL HIGH (ref 70–99)

## 2018-12-28 LAB — CBC
HCT: 28.6 % — ABNORMAL LOW (ref 36.0–46.0)
Hemoglobin: 8.5 g/dL — ABNORMAL LOW (ref 12.0–15.0)
MCH: 24.9 pg — ABNORMAL LOW (ref 26.0–34.0)
MCHC: 29.7 g/dL — ABNORMAL LOW (ref 30.0–36.0)
MCV: 83.9 fL (ref 80.0–100.0)
Platelets: 230 10*3/uL (ref 150–400)
RBC: 3.41 MIL/uL — ABNORMAL LOW (ref 3.87–5.11)
RDW: 17.4 % — ABNORMAL HIGH (ref 11.5–15.5)
WBC: 14.7 10*3/uL — ABNORMAL HIGH (ref 4.0–10.5)
nRBC: 0 % (ref 0.0–0.2)

## 2018-12-28 LAB — RENAL FUNCTION PANEL
Albumin: 2.5 g/dL — ABNORMAL LOW (ref 3.5–5.0)
Anion gap: 15 (ref 5–15)
BUN: 55 mg/dL — ABNORMAL HIGH (ref 8–23)
CO2: 20 mmol/L — ABNORMAL LOW (ref 22–32)
Calcium: 8.6 mg/dL — ABNORMAL LOW (ref 8.9–10.3)
Chloride: 104 mmol/L (ref 98–111)
Creatinine, Ser: 2.89 mg/dL — ABNORMAL HIGH (ref 0.44–1.00)
GFR calc Af Amer: 19 mL/min — ABNORMAL LOW (ref >60)
GFR calc non Af Amer: 16 mL/min — ABNORMAL LOW (ref >60)
Glucose, Bld: 128 mg/dL — ABNORMAL HIGH (ref 70–99)
Phosphorus: 7.6 mg/dL — ABNORMAL HIGH (ref 2.5–4.6)
Potassium: 4.6 mmol/L (ref 3.5–5.1)
Sodium: 139 mmol/L (ref 135–145)

## 2018-12-28 LAB — MAGNESIUM: Magnesium: 1.7 mg/dL (ref 1.7–2.4)

## 2018-12-28 LAB — TRIGLYCERIDES: Triglycerides: 146 mg/dL (ref <150)

## 2018-12-28 MED ORDER — NOREPINEPHRINE 16 MG/250ML-% IV SOLN
0.0000 ug/min | INTRAVENOUS | Status: DC
  Administered 2018-12-28: 32 ug/min via INTRAVENOUS
  Administered 2018-12-28: 1 ug/min via INTRAVENOUS
  Administered 2019-01-02: 2 ug/min via INTRAVENOUS
  Filled 2018-12-28 (×3): qty 250

## 2018-12-28 MED ORDER — SODIUM CHLORIDE 0.9 % IV BOLUS
1000.0000 mL | Freq: Once | INTRAVENOUS | Status: AC
  Administered 2018-12-28: 1000 mL via INTRAVENOUS

## 2018-12-28 MED ORDER — INSULIN DETEMIR 100 UNIT/ML ~~LOC~~ SOLN
18.0000 [IU] | Freq: Two times a day (BID) | SUBCUTANEOUS | Status: DC
  Administered 2018-12-28: 18 [IU] via SUBCUTANEOUS
  Filled 2018-12-28 (×3): qty 0.18

## 2018-12-28 MED ORDER — INSULIN REGULAR(HUMAN) IN NACL 100-0.9 UT/100ML-% IV SOLN
INTRAVENOUS | Status: DC
  Administered 2018-12-28: 5.2 [IU]/h via INTRAVENOUS
  Filled 2018-12-28: qty 100

## 2018-12-28 MED ORDER — INSULIN ASPART 100 UNIT/ML ~~LOC~~ SOLN: 1.0000 [IU] | SUBCUTANEOUS | Status: DC

## 2018-12-28 MED ORDER — AMIODARONE HCL 200 MG PO TABS
400.0000 mg | ORAL_TABLET | Freq: Two times a day (BID) | ORAL | Status: DC
  Administered 2018-12-28 – 2018-12-29 (×3): 400 mg via ORAL
  Filled 2018-12-28 (×4): qty 2

## 2018-12-28 MED ORDER — ACETAMINOPHEN 160 MG/5ML PO SOLN
650.0000 mg | ORAL | Status: DC | PRN
  Administered 2018-12-28: 04:00:00 650 mg
  Filled 2018-12-28: qty 20.3

## 2018-12-28 MED ORDER — AMIODARONE HCL 200 MG PO TABS: 400.0000 mg | ORAL_TABLET | Freq: Every day | ORAL | Status: DC

## 2018-12-28 MED ORDER — LEVETIRACETAM IN NACL 1000 MG/100ML IV SOLN
1000.0000 mg | Freq: Two times a day (BID) | INTRAVENOUS | Status: DC
  Administered 2018-12-28 – 2018-12-29 (×2): 1000 mg via INTRAVENOUS
  Filled 2018-12-28 (×2): qty 100

## 2018-12-28 NOTE — Progress Notes (Addendum)
NEURO HOSPITALIST PROGRESS NOTE   Subjective: Patient intubated and sedated, warming blanket present. Rn at bedside. No twitching movements noted. LTM in place.   Exam: Vitals:   12/28/18 0730 12/28/18 0743  BP: (!) 111/47   Pulse: 77   Resp: 20   Temp:  98.6 F (37 C)  SpO2: 97%     Physical Exam   General: sedated and intubated HEENT-  Normocephalic, no lesions, without obvious abnormality.  Normal external eye and conjunctiva.   Cardiovascular- S1-S2 audible,    Lungs-no rhonchi or wheezing noted,intubated Extremities- Warm, dry and intact Musculoskeletal- generalized, edema Skin-warm and dry, no hyperpigmentation, vitiligo, or suspicious lesions   Neuro:  Mental Status: Patient intubated and sedated. No response to verbal stimuli. Breathing above the vent. Cranial Nerves: PERRL sluggish, corneal reflex intact. No gaze preference noted. Cough and gag weak but present.  Motor/ Sensory: No BUE response to noxious stimuli, BLE patient would wiggle toes to noxious stimuli.  Deep Tendon Reflexes: muted Plantars: Right: upgoing   Left: upgoing Cerebellar: UTA Gait: UTA    Medications:  Scheduled: . aspirin  81 mg Oral Daily  . chlorhexidine gluconate (MEDLINE KIT)  15 mL Mouth Rinse BID  . Chlorhexidine Gluconate Cloth  6 each Topical Daily  . feeding supplement (OSMOLITE 1.5 CAL)  1,000 mL Per Tube Q24H  . feeding supplement (PRO-STAT SUGAR FREE 64)  60 mL Per Tube QID  . fentaNYL (SUBLIMAZE) injection  50 mcg Intravenous Once  . heparin  5,000 Units Subcutaneous Q8H  . levothyroxine  25 mcg Intravenous Daily  . mouth rinse  15 mL Mouth Rinse 10 times per day  . pantoprazole sodium  40 mg Per Tube Daily  . rosuvastatin  40 mg Per Tube q1800  . sodium chloride flush  10-40 mL Intracatheter Q12H  . ticagrelor  90 mg Oral BID   Continuous: . sodium chloride 10 mL/hr at 12/28/18 0700  . sodium chloride    . sodium chloride    . sodium  chloride    . amiodarone 30 mg/hr (12/28/18 0700)  . cisatracurium (NIMBEX) infusion Stopped (12/27/18 1109)  . feeding supplement (VITAL HIGH PROTEIN) Stopped (12/28/18 0215)  . fentaNYL infusion INTRAVENOUS 175 mcg/hr (12/28/18 0700)  . insulin 3 mL/hr at 12/28/18 0700  . levETIRAcetam Stopped (12/28/18 0631)  . norepinephrine (LEVOPHED) Adult infusion 20 mcg/min (12/28/18 0750)  . propofol (DIPRIVAN) infusion Stopped (12/27/18 1738)   ALP:FXTKW/IOXBDZHG arterial line **AND** sodium chloride, sodium chloride, sodium chloride, acetaminophen (TYLENOL) oral liquid 160 mg/5 mL, [COMPLETED] cisatracurium **AND** cisatracurium (NIMBEX) infusion **AND** cisatracurium, fentaNYL, ipratropium-albuterol, ondansetron (ZOFRAN) IV, sodium chloride flush  Pertinent Labs/Diagnostics:  LTM read: 4/11/ 2042- 12/28/2018 0900: This day 1 of continuous EEG monitoring with simultaneous video monitoring was abnormal for reasons #1 slowing suggestive of moderate to severe encephalopathy of nonspecific etiologies.  Number a phasic waves evolving metabolic causes.  #3 occasionally runs of rhythmic synchronized sharp triphasic waves as discussed above may be suggestive of ictal pattern and this increased cortical irritability.  Continuous monitoring is recommended to ensure stability and improvement.  Clinical correlation is advised.  Dg Chest Port 1 View  Result Date: 12/28/2018 CLINICAL DATA:  Initial evaluation for acute aspiration. EXAM: PORTABLE CHEST 1 VIEW COMPARISON:  Prior radiograph from 12/21/2018. FINDINGS: Endotracheal tube in place with tip well positioned above the carina. Enteric tube courses into the  abdomen. Left-sided pacemaker/AICD noted. Cardiomegaly stable. Mediastinal silhouette within normal limits. Aortic atherosclerosis. Defibrillator pad overlies the left chest. Lungs mildly hypoinflated. Prominent perihilar vascular congestion with diffuse interstitial prominence, suggesting pulmonary  interstitial edema. There are increased patchy opacities at the right lung base, concerning for infectious or aspiration pneumonitis. Dense opacity at the retrocardiac left lower lobe could reflect atelectasis or consolidation. No visible large pleural effusion. No pneumothorax. Osseous structures unchanged. IMPRESSION: 1. Interval development of patchy multifocal opacities within the right lung base, which could reflect infectious or aspiration pneumonitis. 2. Dense opacity within the retrocardiac left lower lobe, worsened from previous, and could reflect progressive atelectasis or infiltrate. 3. Cardiomegaly with underlying mild diffuse pulmonary interstitial edema, also mildly worsened. 4. Support apparatus in satisfactory position. Electronically Signed   By: Jeannine Boga M.D.   On: 12/28/2018 03:07   Assessment: 68 year old with past history of a small PCA stroke in 2019, severe coronary artery disease status post stenting with occlusion of the RCA stent status post angioplasty, diastolic heart failure, hypothyroidism, CKD stage III, pacemaker placement for second-degree heart block hypertension and hyperlipidemia admitted after cardiac arrest on cardiac cath. She was cooled and upon rewarming, noted to have some eye twitching and right upper and lower extremity twitching. Although most of her twitching movements on my examination were stimulus induced, she did have a mild right gaze preference which was difficult to break with doll's eye maneuver. This makes me suspicious for a possible underlying seizures versus myoclonus.  Impression: Evaluate for anoxic brain injury Evaluate for underlying seizures/status epilepticus Toxic metabolic encephalopathy  Recommendations: --Currently the patient has insulin drip running along with levo, fentanyl drip and probably not very stable for imaging-I would like to get some sort of brain imaging once she is stable. -continue LTM --showing  triphasics --patient has both hepatic and renal derangements. It was decided to load her with Keppra- 1500 mg IV x1; continue 1 g twice daily of Keppra --Correction of toxic metabolic derangements per primary team as you are. --Correction of any underlying infectious etiology of suspected, per primary team as you are. --Neurology will continue to follow with you.  Laurey Morale, MSN, NP-C [facility] [phone]  Attending neurologist's note to follow  12/28/2018, 7:54 AM   NEUROHOSPITALIST ADDENDUM Performed a face to face diagnostic evaluation.   I have reviewed the contents of history and physical exam as documented by PA/ARNP/Resident and agree with above documentation.  I have discussed and formulated the above plan as documented. Edits to the note have been made as needed.  Patient intubated and on sedation with fentanyl.  On examination patient's pupils are not reactive, oculocephalic reflex.  She does have corneal reflex.  Gag reflex present.  Triple flexion in both lower extremities.  Reviewed EEG, shows sharply contoured triphasic waves, worsens with stimulation. Recommend weaning off fentanyl, will continue long-term EEG until tomorrow.  If she does not have seizures by tomorrow then we can discontinue long-term EEG and obtain MRI brain.     Karena Addison Aroor MD [facility] [phone]   If 7pm to 7am, please call on call as listed on AMION.

## 2018-12-28 NOTE — Procedures (Addendum)
  Electroencephalogram report- LTM with VIDEO  Ordering Physician :dr Rory Percy  Beginning time: 12/27/18 at 81 42 Ending time:  12/28/18 at 0900 CPT/type : 95720  Day of study: day 1    Technical Description: The EEG was performed using standard setting per the guidelines of American Clinical Neurophysiology Society (ACNS).    A minimum of 21 electrodes were placed on scalp according to the International 10-20 or/and 10-10 Systems. Supplemental electrodes were placed as needed. Single EKG electrode was also used to detect cardiac arrhythmia. Patient's behavior was continuously recorded on video simultaneously with EEG. A minimum of 16 channels were used for data display. Each epoch of study was reviewed manually daily and as needed using standard referential and bipolar montages. Computerized quantitative EEG analysis (such as compressed spectral array analysis, trending, automated spike & seizure detection) were used as indicated.    Spike detection: ON  Seizure detection: ON   This  continuous  EEG monitoring with simultaneous video monitoring was performed for this patient with spells of unresponsiveness and twitching as a part of ongoing series to rule out clinical and subclinical electrographic seizures Medications: As per EMR.  Intubated.  Sedated.  There was no pushbutton activations events during this recording.  Background activities marked by continuous reactive background activity slowing predominantly in the delta range ranging between 2 to 3 cps irregular polymorphic waveforms admixed triphasic waves with anterior dominance.  Imposed faster frequencies reaching at times 5 cps .present throughout the recording.  Anteriorly dominant triphasic waves were present throughout the recording.  At times the triphasic waves tend to carry sharply contoured morphology with a spike and wave component and occur in a rhythmic synchronized runs and might be suggestive of intermittent ictal pattern.   This particular pattern was present mostly during first half of the recording  Clinical interpretation: This day 1 of continuous EEG monitoring with simultaneous video monitoring was abnormal for several reasons #1 background activities slowing suggestive of moderate to severe encephalopathy of nonspecific etiologies.  Triphasic  waves suggestive of metabolic causes.  #3  runs of rhythmic synchronized sharp triphasic waves suggestive of cortical , ictal component cannot be completely ruled out.  Continuous monitoring is recommended to ensure stability and improvement.  Clinical correlation is advised.

## 2018-12-28 NOTE — Progress Notes (Signed)
Patient experienced increased work of breathing, increased HR, and increased rhonchi and wheezes in lungs. Patient seemed to have vomited tube feeding. ELink provider notified. Stat chest x-ray obtained. Elink notified of results. No new orders. Will continue to monitor.

## 2018-12-28 NOTE — Progress Notes (Signed)
LTM EEG checked, no skin breakdown noted at Fp1 and Fp2
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
## 2018-12-29 DIAGNOSIS — J9601 Acute respiratory failure with hypoxia: Secondary | ICD-10-CM

## 2018-12-29 DIAGNOSIS — G931 Anoxic brain damage, not elsewhere classified: Secondary | ICD-10-CM

## 2018-12-29 LAB — GLUCOSE, CAPILLARY
Glucose-Capillary: 134 mg/dL — ABNORMAL HIGH (ref 70–99)
Glucose-Capillary: 151 mg/dL — ABNORMAL HIGH (ref 70–99)
Glucose-Capillary: 154 mg/dL — ABNORMAL HIGH (ref 70–99)
Glucose-Capillary: 158 mg/dL — ABNORMAL HIGH (ref 70–99)
Glucose-Capillary: 198 mg/dL — ABNORMAL HIGH (ref 70–99)
Glucose-Capillary: 200 mg/dL — ABNORMAL HIGH (ref 70–99)
Glucose-Capillary: 206 mg/dL — ABNORMAL HIGH (ref 70–99)
Glucose-Capillary: 228 mg/dL — ABNORMAL HIGH (ref 70–99)
Glucose-Capillary: 265 mg/dL — ABNORMAL HIGH (ref 70–99)
Glucose-Capillary: 275 mg/dL — ABNORMAL HIGH (ref 70–99)

## 2018-12-29 LAB — RENAL FUNCTION PANEL
Albumin: 2.1 g/dL — ABNORMAL LOW (ref 3.5–5.0)
Anion gap: 17 — ABNORMAL HIGH (ref 5–15)
BUN: 69 mg/dL — ABNORMAL HIGH (ref 8–23)
CO2: 16 mmol/L — ABNORMAL LOW (ref 22–32)
Calcium: 7.6 mg/dL — ABNORMAL LOW (ref 8.9–10.3)
Chloride: 105 mmol/L (ref 98–111)
Creatinine, Ser: 3.78 mg/dL — ABNORMAL HIGH (ref 0.44–1.00)
GFR calc Af Amer: 14 mL/min — ABNORMAL LOW (ref >60)
GFR calc non Af Amer: 12 mL/min — ABNORMAL LOW (ref >60)
Glucose, Bld: 162 mg/dL — ABNORMAL HIGH (ref 70–99)
Phosphorus: 8.6 mg/dL — ABNORMAL HIGH (ref 2.5–4.6)
Potassium: 4.9 mmol/L (ref 3.5–5.1)
Sodium: 138 mmol/L (ref 135–145)

## 2018-12-29 LAB — CBC
HCT: 25.3 % — ABNORMAL LOW (ref 36.0–46.0)
Hemoglobin: 7.7 g/dL — ABNORMAL LOW (ref 12.0–15.0)
MCH: 25.2 pg — ABNORMAL LOW (ref 26.0–34.0)
MCHC: 30.4 g/dL (ref 30.0–36.0)
MCV: 82.7 fL (ref 80.0–100.0)
Platelets: 230 10*3/uL (ref 150–400)
RBC: 3.06 MIL/uL — ABNORMAL LOW (ref 3.87–5.11)
RDW: 17.8 % — ABNORMAL HIGH (ref 11.5–15.5)
WBC: 20.7 10*3/uL — ABNORMAL HIGH (ref 4.0–10.5)
nRBC: 0 % (ref 0.0–0.2)

## 2018-12-29 LAB — MAGNESIUM: Magnesium: 1.8 mg/dL (ref 1.7–2.4)

## 2018-12-29 MED ORDER — INSULIN ASPART 100 UNIT/ML ~~LOC~~ SOLN
1.0000 [IU] | SUBCUTANEOUS | Status: DC
  Administered 2018-12-29: 2 [IU] via SUBCUTANEOUS
  Administered 2018-12-29 (×2): 3 [IU] via SUBCUTANEOUS

## 2018-12-29 MED ORDER — OSMOLITE 1.5 CAL PO LIQD
1000.0000 mL | ORAL | Status: DC
  Administered 2018-12-29 – 2019-01-01 (×4): 1000 mL
  Filled 2018-12-29 (×5): qty 1000

## 2018-12-29 MED ORDER — DEXTROSE 10 % IV SOLN: INTRAVENOUS | Status: DC | PRN

## 2018-12-29 MED ORDER — INSULIN ASPART 100 UNIT/ML ~~LOC~~ SOLN
4.0000 [IU] | SUBCUTANEOUS | Status: DC
  Administered 2018-12-29: 4 [IU] via SUBCUTANEOUS

## 2018-12-29 MED ORDER — LEVETIRACETAM IN NACL 500 MG/100ML IV SOLN
500.0000 mg | Freq: Two times a day (BID) | INTRAVENOUS | Status: DC
  Administered 2018-12-29 – 2018-12-31 (×4): 500 mg via INTRAVENOUS
  Filled 2018-12-29 (×4): qty 100

## 2018-12-29 MED ORDER — INSULIN REGULAR(HUMAN) IN NACL 100-0.9 UT/100ML-% IV SOLN
INTRAVENOUS | Status: DC
  Administered 2018-12-29: 2.1 [IU]/h via INTRAVENOUS
  Administered 2018-12-30: 1.9 [IU]/h via INTRAVENOUS
  Filled 2018-12-29 (×2): qty 100

## 2018-12-29 MED ORDER — INSULIN DETEMIR 100 UNIT/ML ~~LOC~~ SOLN
12.0000 [IU] | Freq: Two times a day (BID) | SUBCUTANEOUS | Status: DC
  Administered 2018-12-29: 12 [IU] via SUBCUTANEOUS
  Filled 2018-12-29 (×4): qty 0.12

## 2018-12-29 MED ORDER — FUROSEMIDE 10 MG/ML IJ SOLN
120.0000 mg | Freq: Three times a day (TID) | INTRAVENOUS | Status: AC
  Administered 2018-12-29 (×2): 120 mg via INTRAVENOUS
  Filled 2018-12-29 (×2): qty 10

## 2018-12-29 NOTE — Progress Notes (Signed)
Progress Note  Patient Name: Mary Raymond Date of Encounter: 12/29/2018  Primary Cardiologist: Mary Dolly, MD Primary electrophysiologist: Mary Grayer, MD  Subjective   Remains on vent. Now rewarmed. Will awaken and follow commands. Still on 2 NE.   Maintaining NSR. CVP 13-14. Only 90cc urine out last night   Inpatient Medications    Scheduled Meds: . amiodarone  400 mg Oral BID   Followed by  . [START ON 01/05/2019] amiodarone  400 mg Oral Daily  . aspirin  81 mg Oral Daily  . chlorhexidine gluconate (MEDLINE KIT)  15 mL Mouth Rinse BID  . Chlorhexidine Gluconate Cloth  6 each Topical Daily  . feeding supplement (OSMOLITE 1.5 CAL)  1,000 mL Per Tube Q24H  . feeding supplement (PRO-STAT SUGAR FREE 64)  60 mL Per Tube QID  . fentaNYL (SUBLIMAZE) injection  50 mcg Intravenous Once  . heparin  5,000 Units Subcutaneous Q8H  . insulin aspart  1-3 Units Subcutaneous Q4H  . insulin aspart  4 Units Subcutaneous Q4H  . insulin detemir  12 Units Subcutaneous Q12H  . levothyroxine  25 mcg Intravenous Daily  . mouth rinse  15 mL Mouth Rinse 10 times per day  . pantoprazole sodium  40 mg Per Tube Daily  . rosuvastatin  40 mg Per Tube q1800  . sodium chloride flush  10-40 mL Intracatheter Q12H  . ticagrelor  90 mg Oral BID   Continuous Infusions: . sodium chloride 10 mL/hr at 12/29/18 0700  . sodium chloride    . sodium chloride    . sodium chloride    . dextrose    . fentaNYL infusion INTRAVENOUS Stopped (12/28/18 1317)  . levETIRAcetam 1,000 mg (12/29/18 0704)  . norepinephrine (LEVOPHED) Adult infusion 2 mcg/min (12/29/18 0700)  . propofol (DIPRIVAN) infusion Stopped (12/27/18 1738)   PRN Meds: Place/Maintain arterial line **AND** sodium chloride, sodium chloride, sodium chloride, acetaminophen (TYLENOL) oral liquid 160 mg/5 mL, dextrose, fentaNYL, ipratropium-albuterol, sodium chloride flush   Vital Signs    Vitals:   12/29/18 0615 12/29/18 0700 12/29/18 0739  12/29/18 0800  BP: (!) 99/52 (!) 104/52 (!) 152/56 (!) 124/56  Pulse: 70 70 88 84  Resp: (!) 27 (!) 25 20 (!) 22  Temp:      TempSrc:      SpO2: 100% 100% 100% 100%  Weight:      Height:        Intake/Output Summary (Last 24 hours) at 12/29/2018 0839 Last data filed at 12/29/2018 0700 Gross per 24 hour  Intake 2330.41 ml  Output 90 ml  Net 2240.41 ml    I/O since admission: +739  Filed Weights   01/05/2019 0803 12/27/18 0430 12/28/18 0500  Weight: 120.3 kg 122.5 kg 124.4 kg    Telemetry    AV paced in 80s Personally reviewed   ECG    ECG : AV paced at 61      Physical Exam   BP (!) 124/56   Pulse 84   Temp 98.2 F (36.8 C) (Bladder)   Resp (!) 22   Ht '5\' 5"'$  (1.651 m)   Wt 124.4 kg   SpO2 100%   BMI 45.64 kg/m  General:  Awake on vent HEENT: normal +ETT Neck: supple. no JVD. Carotids 2+ bilat; no bruits. No lymphadenopathy or thryomegaly appreciated. Cor: PMI nondisplaced. Regular rate & rhythm. No rubs, gallops or murmurs. Lungs: clear Abdomen: obese soft, nontender, nondistended. No hepatosplenomegaly. No bruits or masses. Good bowel sounds. Extremities:  no cyanosis, clubbing, rash, edema Neuro: awake on vent following some commands    Labs    Chemistry Recent Labs  Lab 12/31/2018 0740  12/26/18 0039  12/27/18 0123 12/27/18 0257  12/27/18 1645 12/28/18 0329 12/29/18 0410  NA 138   < > 135   < > QUESTIONABLE RESULTS, RECOMMEND RECOLLECT TO VERIFY 137   < > 136 139 138  K 4.2   < > 3.4*   < > QUESTIONABLE RESULTS, RECOMMEND RECOLLECT TO VERIFY 3.5   < > 4.4 4.6 4.9  CL 99   < > 97*   < > QUESTIONABLE RESULTS, RECOMMEND RECOLLECT TO VERIFY 102   < > 102 104 105  CO2 25   < > 15*   < > QUESTIONABLE RESULTS, RECOMMEND RECOLLECT TO VERIFY 16*   < > 16* 20* 16*  GLUCOSE 159*   < > 390*   < > 145* 199*   < > 301* 128* 162*  BUN 56*   < > 58*   < > QUESTIONABLE RESULTS, RECOMMEND RECOLLECT TO VERIFY 51*   < > 49* 55* 69*  CREATININE 1.68*   < > 2.31*   <  > 1.56* 2.15*   < > 2.58* 2.89* 3.78*  CALCIUM 9.6   < > 9.2   < > QUESTIONABLE RESULTS, RECOMMEND RECOLLECT TO VERIFY 8.8*   < > 8.3* 8.6* 7.6*  PROT 7.5  --  6.2*  --  3.9*  --   --   --   --   --   ALBUMIN 3.4*  --  3.0*  --  1.9* 2.6*  --   --  2.5* 2.1*  AST 18  --  67*  --  QUESTIONABLE RESULTS, RECOMMEND RECOLLECT TO VERIFY  --   --   --   --   --   ALT 14  --  48*  --  QUESTIONABLE RESULTS, RECOMMEND RECOLLECT TO VERIFY  --   --   --   --   --   ALKPHOS 99  --  115  --  QUESTIONABLE RESULTS, RECOMMEND RECOLLECT TO VERIFY  --   --   --   --   --   BILITOT 0.5  --  1.5*  --  QUESTIONABLE RESULTS, RECOMMEND RECOLLECT TO VERIFY  --   --   --   --   --   GFRNONAA 31*   < > 21*   < > 34* 23*   < > 19* 16* 12*  GFRAA 36*   < > 25*   < > 39* 27*   < > 21* 19* 14*  ANIONGAP 14   < > 23*   < > QUESTIONABLE RESULTS, RECOMMEND RECOLLECT TO VERIFY 19*   < > 18* 15 17*   < > = values in this interval not displayed.     Hematology Recent Labs  Lab 12/27/18 0257 12/28/18 0329 12/29/18 0410  WBC 16.7* 14.7* 20.7*  RBC 3.39* 3.41* 3.06*  HGB 8.7* 8.5* 7.7*  HCT 28.1* 28.6* 25.3*  MCV 82.9 83.9 82.7  MCH 25.7* 24.9* 25.2*  MCHC 31.0 29.7* 30.4  RDW 17.1* 17.4* 17.8*  PLT 280 230 230    Cardiac Enzymes Recent Labs  Lab 01/10/2019 2004 12/26/18 0039 12/26/18 0256 12/26/18 1240  TROPONINI 1.16* 2.05* 1.84* 1.73*   No results for input(s): TROPIPOC in the last 168 hours.   BNP Recent Labs  Lab 01/01/2019 0740  BNP 145.0*  DDimer No results for input(s): DDIMER in the last 168 hours.   Lipid Panel     Component Value Date/Time   CHOL 120 10/27/2018 1133   CHOL 248 (H) 02/23/2013 1159   TRIG 146 12/28/2018 1948   TRIG 274 (H) 09/16/2013 0846   TRIG 359 (H) 02/23/2013 1159   HDL 45 10/27/2018 1133   HDL 45 09/16/2013 0846   HDL 45 02/23/2013 1159   CHOLHDL 2.7 10/27/2018 1133   CHOLHDL 4.3 06/09/2018 0650   VLDL 33 06/09/2018 0650   LDLCALC 25 10/27/2018 1133   LDLCALC  145 (H) 09/16/2013 0846   LDLCALC 131 (H) 02/23/2013 1159     Radiology    Dg Chest Port 1 View  Result Date: 12/28/2018 CLINICAL DATA:  Initial evaluation for acute aspiration. EXAM: PORTABLE CHEST 1 VIEW COMPARISON:  Prior radiograph from 12/30/2018. FINDINGS: Endotracheal tube in place with tip well positioned above the carina. Enteric tube courses into the abdomen. Left-sided pacemaker/AICD noted. Cardiomegaly stable. Mediastinal silhouette within normal limits. Aortic atherosclerosis. Defibrillator pad overlies the left chest. Lungs mildly hypoinflated. Prominent perihilar vascular congestion with diffuse interstitial prominence, suggesting pulmonary interstitial edema. There are increased patchy opacities at the right lung base, concerning for infectious or aspiration pneumonitis. Dense opacity at the retrocardiac left lower lobe could reflect atelectasis or consolidation. No visible large pleural effusion. No pneumothorax. Osseous structures unchanged. IMPRESSION: 1. Interval development of patchy multifocal opacities within the right lung base, which could reflect infectious or aspiration pneumonitis. 2. Dense opacity within the retrocardiac left lower lobe, worsened from previous, and could reflect progressive atelectasis or infiltrate. 3. Cardiomegaly with underlying mild diffuse pulmonary interstitial edema, also mildly worsened. 4. Support apparatus in satisfactory position. Electronically Signed   By: Mary Raymond M.D.   On: 12/28/2018 03:07    Cardiac Studies   Emergent  cardiac catheterization December 25, 2018   Ventricular fibrillation cardiac arrest due to late stent thrombosis of the right coronary proximal to mid stent (Type 4 B MI) noted to have 99% stenosis with TIMI grade II flow.  Globular thrombus was noted.  Successful angioplasty with high pressure using a 4.0 x 15 mm [place] balloon to 17 atm. x multiple inflations.  Mistakenly, it was reported by the control room that  the stent was postdilated with a 3.75 balloon during the implantation in 2019.  Stable anatomy of the left coronary system with distal 30 to 40% left main, ostial to proximal 99% small to moderate sized circumflex, and irregularities in the proximal to mid LAD.  Elevated LVEDP, 21 mmHg.  Inferior wall hypokinesis.  EF 50%.  RECOMMENDATIONS:   Loaded with Brilinta and discontinue Plavix  Discontinue IV Aggrastat 3 to 4 hours after Brilinta loading dose  Critical care medicine to determine whether or not cooling protocol is indicated and to assist with management including control of ventilator.  Overall poor prognosis given residual disease and the unfortunate occurrence of stent thrombosis in this patient with multiple comorbidities that prevented CABG from being performed.     Intervention      Patient Profile     NIKAYA NASBY is a 68 y.o. female with a history of hypertension, hyperlipidemia, type 2 diabetes mellitus, diastolic heart failure, and CAD status post NSTEMI in September 2019 with finding of multivessel CAD (turned down for CABG) and status post DES x2 to the proximal and mid RCA at that time.  She suffered a VF cardiac arrest just prior to being discharged from  [facility] yesterday and was transferred to [place]    1.  VF cardiac arrest 12/21/2018 secondary to subtotal RCA in-stent restenosis.   - peak trop 2.05 - s/p PCI to the 99% in-stent proximal RCA stenosis and a large dominant RCA vessel. - s/p [place] sun protocol.   - rhythm currently stable. Continue amio for now. Can drop dose later this week. Start b-blocker when off NE and BP tolerates  - EF 55-60% by echo - Wean NE as tolerated   2. CAD/NSTEMI - s/p PCI RCA. Residual CAD with 99% densely calcified ostial circumflex stenosis & 50% proximal LAD stenosis. - LCX disease chronic not felt amenable to PCI at this time - Continue ASA, statin & Brilinta. Add b-blocker  when tolerating  3. AKI on CKD 3 - due to ATN (shock) +/- contrast) - baseline creatinine variable  (1.8-2.0) - now 2.89 -> 3.78. Minimal u/o. Will give lasix 120IV bid per CCM - suspect she will need renal involvement  4. Acute hypoxic respiratory failure - in setting of #1 - CCM managing  - possible extubate today if volume status permits  5.  Hyperlipidemia  - target LDL less than 70:  history of mixed hyperlipidemia, was on rosuvastatin 40 mg prior to admission.  Recent LDL in February 2020 was 25  6.  Diabetes mellitus with complication on insulin. - consider SGLT2i on d/c if creatinine improves  CRITICAL CARE Performed by: Glori Bickers  Total critical care time: 35 minutes  Critical care time was exclusive of separately billable procedures and treating other patients.  Critical care was necessary to treat or prevent imminent or life-threatening deterioration.  Critical care was time spent personally by me (independent of midlevel providers or residents) on the following activities: development of treatment plan with patient and/or surrogate as well as nursing, discussions with consultants, evaluation of patient's response to treatment, examination of patient, obtaining history from patient or surrogate, ordering and performing treatments and interventions, ordering and review of laboratory studies, ordering and review of radiographic studies, pulse oximetry and re-evaluation of patient's condition.   Glori Bickers, MD  9:06 AM

## 2018-12-29 NOTE — Progress Notes (Signed)
Assisted tele visit to patient with family member.  Jeanluc Wegman R, RN  

## 2018-12-29 NOTE — Progress Notes (Signed)
Nutrition Follow-up  DOCUMENTATION CODES:   Morbid obesity  INTERVENTION:   Trickle TF via OGT: - Osmolite 1.5 @ 20 ml/hr (480 ml/day)  Trickle tube feeding regimen provides 720 kcal, 30 grams of protein, and 366 ml of H2O (54% of kcal needs, 21% of protein needs).  Goal TF regimen: - Osmolite 1.5 @ 20 ml/hr - Pro-stat 60 ml QID  Goal tube feeding regimen provides 1520 kcal, 150 grams of protein, and 366 ml of H2O (400% of needs).  NUTRITION DIAGNOSIS:   Inadequate oral intake related to inability to eat as evidenced by NPO status.  Ongoing, being addressed via TF  GOAL:   Provide needs based on ASPEN/SCCM guidelines  Progressing  MONITOR:   Vent status, Weight trends, I & O's, Labs  REASON FOR ASSESSMENT:   Ventilator    ASSESSMENT:   68 year old female who presented to [facility] on 4/09 with SOB. PMH of CHF, severe CAD s/p stenting, IDDM, hypothyroidism, CKD stage III, HLD, HTN. Pt found unresponsive on BSC and intubated then transferred to [facility] s/p Vfib arrest with defib x 3, epi x 2 and amiodarone with ROSC after 15 minutes. EKG noted inferior STEMI. S/p cath lab with lot seen on previous RCA stent. Pt admitted to the ICU and started on TTM on 4/09.  4/11 - TF started 4/12 - episode of aspiration of TF  RD consulted to restart TF at a trickle rate per CCM. Pt on normothermia. Weight up 7 kg since admission. Pt is net positive 6.8 L and continues to diurese. EDW: 120.3 kg.  Patient is currently intubated on ventilator support. OGT in place. MV: 12.5 L/min Temp (24hrs), Avg:98.6 F (37 C), Min:98.1 F (36.7 C), Max:99.1 F (37.3 C) BP: 111/51 MAP: 67  Levophed: 1.5 ml/hr  Medications reviewed and include: SSI, Levemir 12 units q 12 hours, Protonix, IV Lasix IVF: NS @ 10 ml/hr  Labs reviewed: BUN 69 (H), creatine 3.78 (H), phosphorus 8.6 (H), hemoglobin 7.7 (L) CBG's: 206, 158, 134 x 12 hours  UOP: 90 ml x 24 hours I/O's: +6.8 L since  admit  NUTRITION - FOCUSED PHYSICAL EXAM:    Most Recent Value  Orbital Region  No depletion  Upper Arm Region  No depletion  Thoracic and Lumbar Region  Unable to assess  Buccal Region  Unable to assess  Temple Region  No depletion  Clavicle Bone Region  No depletion  Clavicle and Acromion Bone Region  No depletion  Scapular Bone Region  Unable to assess  Dorsal Hand  No depletion  Patellar Region  No depletion  Anterior Thigh Region  Unable to assess  Posterior Calf Region  No depletion  Edema (RD Assessment)  Mild [generalized, BLE]  Hair  Reviewed  Eyes  Unable to assess  Mouth  Unable to assess  Skin  Reviewed  Nails  Reviewed       Diet Order:   Diet Order            Diet NPO time specified  Diet effective now              EDUCATION NEEDS:   Not appropriate for education at this time  Skin:  Skin Assessment: Reviewed RN Assessment  Last BM:  PTA/unknown  Height:   Ht Readings from Last 1 Encounters:  01/02/2019 5\' 5"  (1.651 m)    Weight:   Wt Readings from Last 1 Encounters:  12/28/18 124.4 kg    Ideal Body Weight:  56.8  kg  BMI:  Body mass index is 45.64 kg/m.  Estimated Nutritional Needs:   Kcal:  4255-2589  Protein:  >/= 142 grams  Fluid:  >/= 1.5 L    Mary Face, MS, RD, LDN Inpatient Clinical Dietitian Pager: [phone] Weekend/After Hours: [phone]

## 2018-12-29 NOTE — Progress Notes (Signed)
Assisted with camera communication this evening with family

## 2018-12-29 NOTE — Procedures (Addendum)
  Electroencephalogram report- LTM with VIDEO  Ordering Physician :dr Rory Percy  Beginning time: 12/28/18 at 0900 Ending time:  12/29/18 at 0730 CPT/type : 95720  Day of study: day 2   Technical Description: The EEG was performed using standard setting per the guidelines of American Clinical Neurophysiology Society (ACNS).    A minimum of 21 electrodes were placed on scalp according to the International 10-20 or/and 10-10 Systems. Supplemental electrodes were placed as needed. Single EKG electrode was also used to detect cardiac arrhythmia. Patient's behavior was continuously recorded on video simultaneously with EEG. A minimum of 16 channels were used for data display. Each epoch of study was reviewed manually daily and as needed using standard referential and bipolar montages. Computerized quantitative EEG analysis (such as compressed spectral array analysis, trending, automated spike & seizure detection) were used as indicated.    Spike detection: ON  Seizure detection: ON   This  continuous  EEG monitoring with simultaneous video monitoring was performed for this patient with spells of unresponsiveness and twitching as a part of ongoing series to rule out clinical and subclinical electrographic seizures Medications: As per EMR.  Intubated.  Sedated.  Day 1 There was no pushbutton activations events during this recording.  Background activities marked by continuous reactive background activity slowing predominantly in the delta range ranging between 2 to 3 cps irregular polymorphic waveforms admixed triphasic waves with anterior dominance.  Imposed faster frequencies reaching at times 5 cps .present throughout the recording.  Anteriorly dominant triphasic waves were present throughout the recording.  At times the triphasic waves tend to carry sharply contoured morphology with a spike and wave component and occur in a rhythmic synchronized runs or intermittent ictal pattern.  This particular  pattern was present mostly during first half of the recording  Day 2 : Background activities marked by continuous, reactive background activities in the delta and theta range tend to reach 5 to 6 cps during first half of the recording and reaching 6 to 7 cps during the last several hours of the recording suggestive of improvement overall.  Sharply contoured triphasic waves frequently caring morphology of spike and wave discharges present intermittently throughout the recording.  At times anterior dominant triphasic waves occur in rhythmic synchronized run suggestive of cortical irritability.  Majority of this pattern occur with stimulation and may be suggestive of stimulus induced periodic discharges orSIRPID.  This pattern also improved during second half of the recording and become less prominent.  Clinical interpretation: This day 2 of continuous EEG monitoring with simultaneous video monitoring was abnormal for several reasons.  #1 background activities slowing suggestive of encephalopathy of nonspecific etiologies however improved throughout the recording.  #2 triphasic waves suggestive of metabolic causes of encephalopathy.  #3 runs of sharply contoured anterior dominant rhythmic synchronized 1 to 2 cps triphasic waves suggestive of  significant cortical irritability and ictal component cannot be completely ruled out.  Majority of this pattern occur with external stimulation and arousals and may be suggestive of stimulus induced periodic discharges or SIRPID.   Continuous monitoring is recommended.  Clinical correlation is advised.

## 2018-12-29 NOTE — Progress Notes (Signed)
vLTM EEG complete. No skin breakdown 

## 2018-12-29 NOTE — Progress Notes (Signed)
Subjective: Some improvement overnight  Exam: Vitals:   12/29/18 0800 12/29/18 0900  BP: (!) 124/56 (!) 126/57  Pulse: 84 94  Resp: (!) 22 (!) 28  Temp:  98.8 F (37.1 C)  SpO2: 100% 100%   Gen: In bed, NAD Resp: non-labored breathing, no acute distress Abd: soft, nt  Neuro: MS: Opens eyes to mild stimuli, follows commands to show thumbs and wiggle toes bilaterally, though it requires repeated stimulation to get her to do so. CN: Pupils are equal and reactive, eyes very slightly disconjugate with an exotropia on the right, she does appear to fixate and track Motor: She wiggles toes and shows thumbs bilaterally Sensory: Responds to noxious stimulation bilaterally  Pertinent Labs: Elevated creatinine at 3.78, EGFR of 12  EEG reviewed: There are some runs of bifrontally predominant rhythmic activity consistent with triphasics/encephalopathy.  She does not appear to have a significant clinical change during these runs.  Impression: 68 year old female status post V. 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  Her renal function is such that I would not continue her on 1 g twice daily of Keppra, but will reduce this dose.  There is some suggestion of what could be cortical irritability on the EEG, and so for the short-term, until the clinical picture is a little bit more clear (e.g. extubated) I would favor continuing an antiepileptic for the time being.  At this point, I do not think that continued continuous EEG is needed, may consider repeating if clinical events warrant.  Recommendations: 1) reduce Keppra to 500 mg twice daily (renal dose) 2) supportive care is indicated from a neurological perspective 3) can discontinue EEG  This patient is critically ill and at significant risk of neurological worsening, death and care requires constant monitoring of vital signs, hemodynamics,respiratory and cardiac monitoring, neurological assessment, discussion with family, other  specialists and medical decision making of high complexity. I spent 45 minutes of neurocritical care time  in the care of  this patient. This was time spent independent of any time provided by nurse practitioner or PA.  Roland Rack, MD [facility] [phone]  If 7pm- 7am, please page neurology on call as listed in [place]. 12/29/2018  10:44 AM
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
## 2018-12-30 ENCOUNTER — Inpatient Hospital Stay (HOSPITAL_COMMUNITY): Payer: Medicare HMO

## 2018-12-30 LAB — RENAL FUNCTION PANEL
Albumin: 2.1 g/dL — ABNORMAL LOW (ref 3.5–5.0)
Albumin: 1.9 g/dL — ABNORMAL LOW (ref 3.5–5.0)
Anion gap: 17 — ABNORMAL HIGH (ref 5–15)
Anion gap: 15 (ref 5–15)
BUN: 87 mg/dL — ABNORMAL HIGH (ref 8–23)
BUN: 94 mg/dL — ABNORMAL HIGH (ref 8–23)
CO2: 19 mmol/L — ABNORMAL LOW (ref 22–32)
CO2: 20 mmol/L — ABNORMAL LOW (ref 22–32)
Calcium: 7.6 mg/dL — ABNORMAL LOW (ref 8.9–10.3)
Calcium: 7.5 mg/dL — ABNORMAL LOW (ref 8.9–10.3)
Chloride: 104 mmol/L (ref 98–111)
Chloride: 105 mmol/L (ref 98–111)
Creatinine, Ser: 7.7 mg/dL — ABNORMAL HIGH (ref 0.44–1.00)
Creatinine, Ser: 4.67 mg/dL — ABNORMAL HIGH (ref 0.44–1.00)
GFR calc Af Amer: 6 mL/min — ABNORMAL LOW (ref >60)
GFR calc Af Amer: 10 mL/min — ABNORMAL LOW (ref >60)
GFR calc non Af Amer: 5 mL/min — ABNORMAL LOW (ref >60)
GFR calc non Af Amer: 9 mL/min — ABNORMAL LOW (ref >60)
Glucose, Bld: 140 mg/dL — ABNORMAL HIGH (ref 70–99)
Glucose, Bld: 239 mg/dL — ABNORMAL HIGH (ref 70–99)
Phosphorus: 8.3 mg/dL — ABNORMAL HIGH (ref 2.5–4.6)
Phosphorus: 8.1 mg/dL — ABNORMAL HIGH (ref 2.5–4.6)
Potassium: 4.7 mmol/L (ref 3.5–5.1)
Potassium: 4.6 mmol/L (ref 3.5–5.1)
Sodium: 140 mmol/L (ref 135–145)
Sodium: 140 mmol/L (ref 135–145)

## 2018-12-30 LAB — GLUCOSE, CAPILLARY
Glucose-Capillary: 125 mg/dL — ABNORMAL HIGH (ref 70–99)
Glucose-Capillary: 133 mg/dL — ABNORMAL HIGH (ref 70–99)
Glucose-Capillary: 136 mg/dL — ABNORMAL HIGH (ref 70–99)
Glucose-Capillary: 137 mg/dL — ABNORMAL HIGH (ref 70–99)
Glucose-Capillary: 141 mg/dL — ABNORMAL HIGH (ref 70–99)
Glucose-Capillary: 144 mg/dL — ABNORMAL HIGH (ref 70–99)
Glucose-Capillary: 149 mg/dL — ABNORMAL HIGH (ref 70–99)
Glucose-Capillary: 158 mg/dL — ABNORMAL HIGH (ref 70–99)
Glucose-Capillary: 160 mg/dL — ABNORMAL HIGH (ref 70–99)
Glucose-Capillary: 177 mg/dL — ABNORMAL HIGH (ref 70–99)
Glucose-Capillary: 193 mg/dL — ABNORMAL HIGH (ref 70–99)
Glucose-Capillary: 213 mg/dL — ABNORMAL HIGH (ref 70–99)
Glucose-Capillary: 228 mg/dL — ABNORMAL HIGH (ref 70–99)
Glucose-Capillary: 231 mg/dL — ABNORMAL HIGH (ref 70–99)
Glucose-Capillary: 254 mg/dL — ABNORMAL HIGH (ref 70–99)

## 2018-12-30 LAB — CBC
HCT: 22.4 % — ABNORMAL LOW (ref 36.0–46.0)
Hemoglobin: 7 g/dL — ABNORMAL LOW (ref 12.0–15.0)
MCH: 25.5 pg — ABNORMAL LOW (ref 26.0–34.0)
MCHC: 31.3 g/dL (ref 30.0–36.0)
MCV: 81.5 fL (ref 80.0–100.0)
Platelets: 229 10*3/uL (ref 150–400)
RBC: 2.75 MIL/uL — ABNORMAL LOW (ref 3.87–5.11)
RDW: 18.1 % — ABNORMAL HIGH (ref 11.5–15.5)
WBC: 17 10*3/uL — ABNORMAL HIGH (ref 4.0–10.5)
nRBC: 0.2 % (ref 0.0–0.2)

## 2018-12-30 LAB — URINALYSIS, ROUTINE W REFLEX MICROSCOPIC
Bilirubin Urine: NEGATIVE
Glucose, UA: NEGATIVE mg/dL
Ketones, ur: NEGATIVE mg/dL
Nitrite: NEGATIVE
Protein, ur: 100 mg/dL — AB
Specific Gravity, Urine: 1.024 (ref 1.005–1.030)
WBC, UA: >50 WBC/hpf — ABNORMAL HIGH (ref 0–5)
pH: 6 (ref 5.0–8.0)

## 2018-12-30 LAB — MAGNESIUM: Magnesium: 2.1 mg/dL (ref 1.7–2.4)

## 2018-12-30 LAB — SODIUM, URINE, RANDOM: Sodium, Ur: 31 mmol/L

## 2018-12-30 LAB — CREATININE, URINE, RANDOM: Creatinine, Urine: 69.24 mg/dL

## 2018-12-30 MED ORDER — INSULIN DETEMIR 100 UNIT/ML ~~LOC~~ SOLN
5.0000 [IU] | Freq: Two times a day (BID) | SUBCUTANEOUS | Status: DC
  Filled 2018-12-30 (×3): qty 0.05

## 2018-12-30 MED ORDER — INSULIN DETEMIR 100 UNIT/ML ~~LOC~~ SOLN
5.0000 [IU] | Freq: Two times a day (BID) | SUBCUTANEOUS | Status: DC
  Administered 2018-12-30 – 2018-12-31 (×2): 5 [IU] via SUBCUTANEOUS
  Filled 2018-12-30 (×6): qty 0.05

## 2018-12-30 MED ORDER — LORAZEPAM 2 MG/ML IJ SOLN
1.0000 mg | INTRAMUSCULAR | Status: DC | PRN
  Administered 2018-12-30 – 2019-01-01 (×2): 1 mg via INTRAVENOUS
  Filled 2018-12-30 (×2): qty 1

## 2018-12-30 MED ORDER — HEPARIN (PORCINE) 2000 UNITS/L FOR CRRT
INTRAVENOUS_CENTRAL | Status: DC | PRN
  Filled 2018-12-30: qty 1000

## 2018-12-30 MED ORDER — FENTANYL CITRATE (PF) 100 MCG/2ML IJ SOLN
25.0000 ug | Freq: Once | INTRAMUSCULAR | Status: AC
  Administered 2018-12-30: 25 ug via INTRAVENOUS

## 2018-12-30 MED ORDER — DEXTROSE 10 % IV SOLN: INTRAVENOUS | Status: DC | PRN

## 2018-12-30 MED ORDER — PRO-STAT SUGAR FREE PO LIQD
60.0000 mL | Freq: Four times a day (QID) | ORAL | Status: DC
  Administered 2018-12-30 – 2019-01-02 (×13): 60 mL
  Filled 2018-12-30 (×14): qty 60

## 2018-12-30 MED ORDER — INSULIN ASPART 100 UNIT/ML ~~LOC~~ SOLN
1.0000 [IU] | SUBCUTANEOUS | Status: DC
  Administered 2018-12-30: 16:00:00 3 [IU] via SUBCUTANEOUS
  Administered 2018-12-30: 1 [IU] via SUBCUTANEOUS
  Administered 2018-12-31: 12:00:00 2 [IU] via SUBCUTANEOUS
  Administered 2018-12-31 (×2): 3 [IU] via SUBCUTANEOUS

## 2018-12-30 MED ORDER — FENTANYL CITRATE (PF) 100 MCG/2ML IJ SOLN
INTRAMUSCULAR | Status: AC
  Filled 2018-12-30: qty 2

## 2018-12-30 MED ORDER — PRISMASOL BGK 4/2.5 32-4-2.5 MEQ/L REPLACEMENT SOLN
Status: DC
  Administered 2018-12-30 – 2019-01-02 (×5): via INTRAVENOUS_CENTRAL
  Filled 2018-12-30 (×10): qty 5000

## 2018-12-30 MED ORDER — PRISMASOL BGK 4/2.5 32-4-2.5 MEQ/L REPLACEMENT SOLN
Status: DC
  Administered 2018-12-30 – 2019-01-02 (×4): via INTRAVENOUS_CENTRAL
  Filled 2018-12-30 (×10): qty 5000

## 2018-12-30 MED ORDER — INSULIN ASPART 100 UNIT/ML ~~LOC~~ SOLN
1.0000 [IU] | SUBCUTANEOUS | Status: DC
  Administered 2018-12-30 (×2): 1 [IU] via SUBCUTANEOUS

## 2018-12-30 MED ORDER — CARVEDILOL 3.125 MG PO TABS
3.1250 mg | ORAL_TABLET | Freq: Two times a day (BID) | ORAL | Status: DC
  Administered 2018-12-30 – 2019-01-03 (×8): 3.125 mg
  Filled 2018-12-30 (×8): qty 1

## 2018-12-30 MED ORDER — INSULIN ASPART 100 UNIT/ML ~~LOC~~ SOLN: 1.0000 [IU] | SUBCUTANEOUS | Status: DC

## 2018-12-30 MED ORDER — LEVOTHYROXINE SODIUM 50 MCG PO TABS
50.0000 ug | ORAL_TABLET | Freq: Every day | ORAL | Status: DC
  Administered 2018-12-31 – 2019-01-03 (×4): 50 ug
  Filled 2018-12-30 (×5): qty 1

## 2018-12-30 MED ORDER — HEPARIN SODIUM (PORCINE) 1000 UNIT/ML DIALYSIS
1000.0000 [IU] | INTRAMUSCULAR | Status: DC | PRN
  Administered 2019-01-02: 3000 [IU] via INTRAVENOUS_CENTRAL
  Administered 2019-01-02: 2400 [IU] via INTRAVENOUS_CENTRAL
  Filled 2018-12-30: qty 6
  Filled 2018-12-30: qty 3
  Filled 2018-12-30 (×2): qty 6
  Filled 2018-12-30: qty 4

## 2018-12-30 MED ORDER — ROSUVASTATIN CALCIUM 5 MG PO TABS
10.0000 mg | ORAL_TABLET | Freq: Every day | ORAL | Status: DC
  Administered 2018-12-31 – 2019-01-02 (×3): 10 mg
  Filled 2018-12-30 (×3): qty 2

## 2018-12-30 MED ORDER — PRISMASOL BGK 4/2.5 32-4-2.5 MEQ/L IV SOLN
INTRAVENOUS | Status: DC
  Administered 2018-12-30 – 2019-01-02 (×11): via INTRAVENOUS_CENTRAL
  Filled 2018-12-30 (×41): qty 5000

## 2018-12-30 NOTE — Progress Notes (Addendum)
Subjective: Stopped following commands  Exam: Vitals:   12/30/18 0757 12/30/18 0800  BP:  (!) 149/59  Pulse:  87  Resp:  15  Temp: 98.5 F (36.9 C)   SpO2:  100%   Gen: In bed, NAD Resp: non-labored breathing, no acute distress Abd: soft, nt  Neuro: MS: Opens eyes to mild stimuli, she does not follow commands today, but does blink to threat CN: Pupils are equal and reactive, she does blink to threat  motor: She withdraws to noxious stimulation on the left, localizes on the right. Sensory: Responds to noxious stimulation bilaterally  Pertinent Labs: Elevated creatinine at 7.7, increased from 3.78, BUN of 87 up from 55 2 days ago  EEG reviewed: There are some runs of bifrontally predominant rhythmic activity consistent with triphasics/encephalopathy.  She does not appear to have a significant clinical change during these runs.  Impression: 68 year old female status post V. 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  Her renal function is such that I would not continue her on 1 g twice daily of Keppra, but will reduce this dose.  There is some suggestion of what could be cortical irritability on the EEG, and so for the short-term, until the clinical picture is a little bit more clear (e.g. extubated) I would favor continuing an antiepileptic.  I suspect that her worsening is due to progressive renal dysfunction as opposed to seizures, primary team is considering starting dialysis.   Recommendations: 1) continue Keppra 500 mg twice daily  2) from a neurological perspective, she still has a decent chance of recovery to her previous baseline. 3) treatment of renal failure per CCM 4) CT head   Roland Rack, MD [facility] [phone]  If 7pm- 7am, please page neurology on call as listed in [place]. 12/30/2018  9:18 AM
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
## 2018-12-30 NOTE — Progress Notes (Signed)
Nutrition Follow-up  RD working remotely.  DOCUMENTATION CODES:   Morbid obesity  INTERVENTION:  Continue TF via OGT: - Osmolite 1.5 @ 20 ml/hr - Add Pro-stat 60 ml QID  Tube feeding regimen provides 1520 kcal, 150 grams of protein, and 366 ml of H2O (100% of needs).  NUTRITION DIAGNOSIS:   Inadequate oral intake related to inability to eat as evidenced by NPO status.  Ongoing, being addressed via TF  GOAL:   Provide needs based on ASPEN/SCCM guidelines  Met via TF  MONITOR:   Vent status, Labs, Weight trends, I & O's, TF tolerance  REASON FOR ASSESSMENT:   Ventilator    ASSESSMENT:   68 year old female who presented to the [facility] ED on 4/09 with SOB. PMH of CHF, severe CAD s/p stenting, IDDM, hypothyroidism, CKD stage III, HLD, HTN. Pt found unresponsive on BSC and intubated then transferred to [place] s/p Vfib arrest with defib x 3, epi x 2 and amiodarone with ROSC after 15 minutes. EKG noted inferior STEMI. S/p cath lab with lot seen on previous RCA stent. Pt admitted to the ICU and started on TTM on 4/09.  4/11 - TF started 4/12 - episode of aspiration of TF 4/13 - started on trickle TF  Spoke with CCM MD via phone call who approved adding protein modulars to pt's TF regimen to meet nutritional needs.  Reviewed notes, neuro exam worse today. CCM to talk with family regarding need for CRRT as renal function is worsening. Pressors off.  Weight up 21 lbs since admission. Pt is net positive 7.5 L with very little UOP. Reviewed RN edema assessment. Moderate pitting generalized edema and moderate pitting edema to BUE, BLE.  Patient is currently intubated on ventilator support MV: 15 L/min Temp (24hrs), Avg:99.1 F (37.3 C), Min:98.5 F (36.9 C), Max:99.9 F (37.7 C) BP: 143/56 MAP: 80  IVF: NS @ 10 ml/hr Insulin drip: 1 ml/hr  Medications reviewed and include: Novolog 1 unit q 4 hours, SSI q 4 hours, Levemir 5 units q 12 hours, Protonix, IV Keppra  Labs  reviewed: BUN 87 (H), creatinine 7.70 (H), phosphorus 8.3 (H), hemoglobin 7.0 (L) CBG's: 158, 144, 136, 125, 137, 133, 141, 177 x 24 hours  UOP: 65 ml x 24 hours I/O's: +7.5 L since admit  Diet Order:   Diet Order            Diet NPO time specified  Diet effective now              EDUCATION NEEDS:   Not appropriate for education at this time  Skin:  Skin Assessment: Reviewed RN Assessment  Last BM:  12/29/18 large type 7  Height:   Ht Readings from Last 1 Encounters:  01/11/2019 _0  (1.651 m)    Weight:   Wt Readings from Last 1 Encounters:  12/30/18 129.9 kg    Ideal Body Weight:  56.8 kg  BMI:  Body mass index is 47.66 kg/m.  Estimated Nutritional Needs:   Kcal:  2197-5883  Protein:  >/= 142 grams  Fluid:  >/= 1.5 L    Gaynell Face, MS, RD, LDN Inpatient Clinical Dietitian Pager: [phone] Weekend/After Hours: [phone]

## 2018-12-30 NOTE — Progress Notes (Signed)
RT transported pt to and from CT on vent with same settings. Pt tol well
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
## 2018-12-30 NOTE — Progress Notes (Signed)
Progress Note  Patient Name: Mary Raymond Date of Encounter: 12/30/2018  Primary Cardiologist: Carlyle Dolly, MD Primary electrophysiologist: [name], MD  Subjective   Rewarmed. Remains on vent at 40%. Seen with Neurology at the bedside. Not following commands today (did yesterday) but no apparent focal deficits.  Minimal response to high-dose IV lasix yesterday. Creatinine up to 7.7.   NE weaned off.  SBP 130-140 . CVP 16-17  Inpatient Medications    Scheduled Meds: . amiodarone  400 mg Oral BID   Followed by  . [START ON 01/05/2019] amiodarone  400 mg Oral Daily  . aspirin  81 mg Oral Daily  . chlorhexidine gluconate (MEDLINE KIT)  15 mL Mouth Rinse BID  . Chlorhexidine Gluconate Cloth  6 each Topical Daily  . feeding supplement (OSMOLITE 1.5 CAL)  1,000 mL Per Tube Q24H  . fentaNYL (SUBLIMAZE) injection  50 mcg Intravenous Once  . heparin  5,000 Units Subcutaneous Q8H  . insulin aspart  1 Units Subcutaneous Q4H  . insulin aspart  1-3 Units Subcutaneous Q4H  . insulin detemir  5 Units Subcutaneous Q12H  . levothyroxine  25 mcg Intravenous Daily  . mouth rinse  15 mL Mouth Rinse 10 times per day  . pantoprazole sodium  40 mg Per Tube Daily  . rosuvastatin  40 mg Per Tube q1800  . sodium chloride flush  10-40 mL Intracatheter Q12H  . ticagrelor  90 mg Oral BID   Continuous Infusions: . sodium chloride Stopped (12/30/18 0653)  . sodium chloride    . sodium chloride    . sodium chloride    . fentaNYL infusion INTRAVENOUS Stopped (12/29/18 1322)  . insulin 0.8 mL/hr at 12/30/18 0700  . levETIRAcetam 400 mL/hr at 12/30/18 0700  . norepinephrine (LEVOPHED) Adult infusion Stopped (12/29/18 1410)  . propofol (DIPRIVAN) infusion Stopped (12/27/18 1738)   PRN Meds: Place/Maintain arterial line **AND** sodium chloride, sodium chloride, sodium chloride, acetaminophen (TYLENOL) oral liquid 160 mg/5 mL, fentaNYL, ipratropium-albuterol, sodium chloride flush   Vital  Signs    Vitals:   12/30/18 0500 12/30/18 0600 12/30/18 0700 12/30/18 0757  BP: (!) 146/68 (!) 158/67 (!) 142/59   Pulse: 81 84 82   Resp: (!) 23 (!) 25 (!) 22   Temp:    98.5 F (36.9 C)  TempSrc:    Oral  SpO2: 100% 100% 100%   Weight:  129.9 kg    Height:        Intake/Output Summary (Last 24 hours) at 12/30/2018 0820 Last data filed at 12/30/2018 0700 Gross per 24 hour  Intake 585.25 ml  Output 65 ml  Net 520.25 ml    I/O since admission: +739  Filed Weights   12/27/18 0430 12/28/18 0500 12/30/18 0600  Weight: 122.5 kg 124.4 kg 129.9 kg    Telemetry    AV paced in 80-90s Personally reviewed   ECG    ECG : AV paced at 61      Physical Exam   BP (!) 142/59   Pulse 82   Temp 98.5 F (36.9 C) (Oral)   Resp (!) 22   Ht '5\' 5"'$  (1.651 m)   Wt 129.9 kg   SpO2 100%   BMI 47.66 kg/m  General: On vent Will not follow commands.  HEENT: normal + ETT Neck: supple. JVP to ear. Carotids 2+ bilat; no bruits. No lymphadenopathy or thryomegaly appreciated. Cor: PMI nondisplaced. Regular rate & rhythm. No rubs, gallops or murmurs. Lungs: clear Abdomen: obese  soft, nontender, nondistended. No hepatosplenomegaly. No bruits or masses. Good bowel sounds. Extremities: no cyanosis, clubbing, rash, 2+ edema Neuro: awake on vent. Not following commands. Will withdraw to noxious stimuli    Labs    Chemistry Recent Labs  Lab 01/09/2019 0740  12/26/18 0039  12/27/18 0123  12/28/18 0329 12/29/18 0410 12/30/18 0409  NA 138   < > 135   < > QUESTIONABLE RESULTS, RECOMMEND RECOLLECT TO VERIFY   < > 139 138 140  K 4.2   < > 3.4*   < > QUESTIONABLE RESULTS, RECOMMEND RECOLLECT TO VERIFY   < > 4.6 4.9 4.7  CL 99   < > 97*   < > QUESTIONABLE RESULTS, RECOMMEND RECOLLECT TO VERIFY   < > 104 105 104  CO2 25   < > 15*   < > QUESTIONABLE RESULTS, RECOMMEND RECOLLECT TO VERIFY   < > 20* 16* 19*  GLUCOSE 159*   < > 390*   < > 145*   < > 128* 162* 140*  BUN 56*   < > 58*   < >  QUESTIONABLE RESULTS, RECOMMEND RECOLLECT TO VERIFY   < > 55* 69* 87*  CREATININE 1.68*   < > 2.31*   < > 1.56*   < > 2.89* 3.78* 7.70*  CALCIUM 9.6   < > 9.2   < > QUESTIONABLE RESULTS, RECOMMEND RECOLLECT TO VERIFY   < > 8.6* 7.6* 7.6*  PROT 7.5  --  6.2*  --  3.9*  --   --   --   --   ALBUMIN 3.4*  --  3.0*  --  1.9*   < > 2.5* 2.1* 2.1*  AST 18  --  67*  --  QUESTIONABLE RESULTS, RECOMMEND RECOLLECT TO VERIFY  --   --   --   --   ALT 14  --  48*  --  QUESTIONABLE RESULTS, RECOMMEND RECOLLECT TO VERIFY  --   --   --   --   ALKPHOS 99  --  115  --  QUESTIONABLE RESULTS, RECOMMEND RECOLLECT TO VERIFY  --   --   --   --   BILITOT 0.5  --  1.5*  --  QUESTIONABLE RESULTS, RECOMMEND RECOLLECT TO VERIFY  --   --   --   --   GFRNONAA 31*   < > 21*   < > 34*   < > 16* 12* 5*  GFRAA 36*   < > 25*   < > 39*   < > 19* 14* 6*  ANIONGAP 14   < > 23*   < > QUESTIONABLE RESULTS, RECOMMEND RECOLLECT TO VERIFY   < > 15 17* 17*   < > = values in this interval not displayed.     Hematology Recent Labs  Lab 12/28/18 0329 12/29/18 0410 12/30/18 0516  WBC 14.7* 20.7* 17.0*  RBC 3.41* 3.06* 2.75*  HGB 8.5* 7.7* 7.0*  HCT 28.6* 25.3* 22.4*  MCV 83.9 82.7 81.5  MCH 24.9* 25.2* 25.5*  MCHC 29.7* 30.4 31.3  RDW 17.4* 17.8* 18.1*  PLT 230 230 229    Cardiac Enzymes Recent Labs  Lab 12/30/2018 2004 12/26/18 0039 12/26/18 0256 12/26/18 1240  TROPONINI 1.16* 2.05* 1.84* 1.73*   No results for input(s): TROPIPOC in the last 168 hours.   BNP Recent Labs  Lab 12/24/2018 0740  BNP 145.0*     DDimer No results for input(s): DDIMER in the last 168 hours.  Lipid Panel     Component Value Date/Time   CHOL 120 10/27/2018 1133   CHOL 248 (H) 02/23/2013 1159   TRIG 146 12/28/2018 1948   TRIG 274 (H) 09/16/2013 0846   TRIG 359 (H) 02/23/2013 1159   HDL 45 10/27/2018 1133   HDL 45 09/16/2013 0846   HDL 45 02/23/2013 1159   CHOLHDL 2.7 10/27/2018 1133   CHOLHDL 4.3 06/09/2018 0650   VLDL 33  06/09/2018 0650   LDLCALC 25 10/27/2018 1133   LDLCALC 145 (H) 09/16/2013 0846   LDLCALC 131 (H) 02/23/2013 1159     Radiology    No results found.  Cardiac Studies   Emergent  cardiac catheterization December 25, 2018   Ventricular fibrillation cardiac arrest due to late stent thrombosis of the right coronary proximal to mid stent (Type 4 B MI) noted to have 99% stenosis with TIMI grade II flow.  Globular thrombus was noted.  Successful angioplasty with high pressure using a 4.0 x 15 mm Sherman balloon to 17 atm. x multiple inflations.  Mistakenly, it was reported by the control room that the stent was postdilated with a 3.75 balloon during the implantation in 2019.  Stable anatomy of the left coronary system with distal 30 to 40% left main, ostial to proximal 99% small to moderate sized circumflex, and irregularities in the proximal to mid LAD.  Elevated LVEDP, 21 mmHg.  Inferior wall hypokinesis.  EF 50%.  RECOMMENDATIONS:   Loaded with Brilinta and discontinue Plavix  Discontinue IV Aggrastat 3 to 4 hours after Brilinta loading dose  Critical care medicine to determine whether or not cooling protocol is indicated and to assist with management including control of ventilator.  Overall poor prognosis given residual disease and the unfortunate occurrence of stent thrombosis in this patient with multiple comorbidities that prevented CABG from being performed.     Intervention      Patient Profile     Mary Raymond is a 68 y.o. female with a history of hypertension, hyperlipidemia, type 2 diabetes mellitus, diastolic heart failure, and CAD status post NSTEMI in September 2019 with finding of multivessel CAD (turned down for CABG) and status post DES x2 to the proximal and mid RCA at that time.  She suffered a VF cardiac arrest just prior to being discharged from [facility] yesterday and was transferred to [place]    1.  VF cardiac arrest  12/24/2018 secondary to subtotal RCA in-stent restenosis.   - peak trop 2.05 - s/p PCI to the 99% in-stent proximal RCA stenosis and a large dominant RCA vessel. - s/p Arctic sun protocol.   - rhythm currently stable. Off NE. Will stop amio. Start low-dose carvedilol  - EF 55-60% by echo - Neuro exam worse today. Suspect mostly metabolic encephalopathy in setting of AKI. CCM discussing case with family regarding level of aggressiveness with care. Await their input. D/w CCM team at bedside.   2. CAD/NSTEMI - s/p PCI RCA. Residual CAD with 99% densely calcified ostial circumflex stenosis & 50% proximal LAD stenosis. - LCX disease chronic not felt amenable to PCI at this time - Continue ASA, statin & Brilinta. Add carvedilol  3. AKI on CKD 3 - due to ATN (shock) +/- contrast) - baseline creatinine variable  (1.8-2.0) - now 2.89 -> 3.78 -> 7.7. Anuric - will need HD/CVVHD.   4. Acute hypoxic respiratory failure - in setting of #1 - CCM managing  - possible extubation when  volume status permits  5.  Hyperlipidemia  - target LDL less than 70:  history of mixed hyperlipidemia, was on rosuvastatin 40 mg prior to admission.  Recent LDL in February 2020 was 25  6.  Diabetes mellitus with complication on insulin. - consider SGLT2i on d/c if creatinine improves  CRITICAL CARE Performed by: Glori Bickers  Total critical care time: 35 minutes  Critical care time was exclusive of separately billable procedures and treating other patients.  Critical care was necessary to treat or prevent imminent or life-threatening deterioration.  Critical care was time spent personally by me (independent of midlevel providers or residents) on the following activities: development of treatment plan with patient and/or surrogate as well as nursing, discussions with consultants, evaluation of patient's response to treatment, examination of patient, obtaining history from patient or surrogate, ordering and  performing treatments and interventions, ordering and review of laboratory studies, ordering and review of radiographic studies, pulse oximetry and re-evaluation of patient's condition.    Glori Bickers, MD  8:20 AM

## 2018-12-30 NOTE — Procedures (Signed)
Central Venous hemodialysis Catheter Insertion Procedure Note Mary Raymond [id number] [birth year]  Procedure: Insertion of Central Venous hemodialysis Catheter Indications: CRRT  Procedure Details Consent: Verbally obtained from daughter, next of kin Time Out: Verified patient identification, verified procedure, site/side was marked, verified correct patient position, special equipment/implants available, medications/allergies/relevent history reviewed, required imaging and test results available.  Performed  Maximum sterile technique was used including antiseptics, cap, gloves, gown, hand hygiene, mask and sheet. Skin prep: Chlorhexidine; local anesthetic administered A antimicrobial bonded/coated triple lumen catheter was placed in the right internal jugular vein using the Seldinger technique.  Evaluation Blood flow good Complications: No apparent complications Patient did tolerate procedure well. Chest X-ray ordered to verify placement.  CXR: pending.  Mary Raymond 12/30/2018, 1:18 PM

## 2018-12-30 NOTE — Progress Notes (Signed)
Wasted 200 mL Fentanyl with Ericka Pontiff, RN

## 2018-12-31 ENCOUNTER — Inpatient Hospital Stay (HOSPITAL_COMMUNITY): Payer: Medicare HMO

## 2018-12-31 DIAGNOSIS — E7800 Pure hypercholesterolemia, unspecified: Secondary | ICD-10-CM

## 2018-12-31 DIAGNOSIS — I2102 ST elevation (STEMI) myocardial infarction involving left anterior descending coronary artery: Secondary | ICD-10-CM

## 2018-12-31 DIAGNOSIS — I2119 ST elevation (STEMI) myocardial infarction involving other coronary artery of inferior wall: Secondary | ICD-10-CM

## 2018-12-31 DIAGNOSIS — J690 Pneumonitis due to inhalation of food and vomit: Secondary | ICD-10-CM | POA: Diagnosis present

## 2018-12-31 DIAGNOSIS — J95851 Ventilator associated pneumonia: Secondary | ICD-10-CM | POA: Diagnosis present

## 2018-12-31 DIAGNOSIS — E785 Hyperlipidemia, unspecified: Secondary | ICD-10-CM | POA: Diagnosis present

## 2018-12-31 DIAGNOSIS — J9601 Acute respiratory failure with hypoxia: Secondary | ICD-10-CM | POA: Diagnosis present

## 2018-12-31 LAB — RENAL FUNCTION PANEL
Albumin: 1.9 g/dL — ABNORMAL LOW (ref 3.5–5.0)
Albumin: 1.9 g/dL — ABNORMAL LOW (ref 3.5–5.0)
Anion gap: 17 — ABNORMAL HIGH (ref 5–15)
Anion gap: 12 (ref 5–15)
BUN: 77 mg/dL — ABNORMAL HIGH (ref 8–23)
BUN: 57 mg/dL — ABNORMAL HIGH (ref 8–23)
CO2: 21 mmol/L — ABNORMAL LOW (ref 22–32)
CO2: 24 mmol/L (ref 22–32)
Calcium: 8 mg/dL — ABNORMAL LOW (ref 8.9–10.3)
Calcium: 8 mg/dL — ABNORMAL LOW (ref 8.9–10.3)
Chloride: 101 mmol/L (ref 98–111)
Chloride: 102 mmol/L (ref 98–111)
Creatinine, Ser: 3.27 mg/dL — ABNORMAL HIGH (ref 0.44–1.00)
Creatinine, Ser: 2.26 mg/dL — ABNORMAL HIGH (ref 0.44–1.00)
GFR calc Af Amer: 16 mL/min — ABNORMAL LOW (ref >60)
GFR calc Af Amer: 25 mL/min — ABNORMAL LOW (ref >60)
GFR calc non Af Amer: 14 mL/min — ABNORMAL LOW (ref >60)
GFR calc non Af Amer: 22 mL/min — ABNORMAL LOW (ref >60)
Glucose, Bld: 155 mg/dL — ABNORMAL HIGH (ref 70–99)
Glucose, Bld: 246 mg/dL — ABNORMAL HIGH (ref 70–99)
Phosphorus: 5.4 mg/dL — ABNORMAL HIGH (ref 2.5–4.6)
Phosphorus: 4.2 mg/dL (ref 2.5–4.6)
Potassium: 4.8 mmol/L (ref 3.5–5.1)
Potassium: 4.4 mmol/L (ref 3.5–5.1)
Sodium: 139 mmol/L (ref 135–145)
Sodium: 138 mmol/L (ref 135–145)

## 2018-12-31 LAB — CBC
HCT: 22 % — ABNORMAL LOW (ref 36.0–46.0)
HCT: 24.2 % — ABNORMAL LOW (ref 36.0–46.0)
Hemoglobin: 6.8 g/dL — CL (ref 12.0–15.0)
Hemoglobin: 7.6 g/dL — ABNORMAL LOW (ref 12.0–15.0)
MCH: 25.5 pg — ABNORMAL LOW (ref 26.0–34.0)
MCH: 25.6 pg — ABNORMAL LOW (ref 26.0–34.0)
MCHC: 30.9 g/dL (ref 30.0–36.0)
MCHC: 31.4 g/dL (ref 30.0–36.0)
MCV: 82.4 fL (ref 80.0–100.0)
MCV: 81.5 fL (ref 80.0–100.0)
Platelets: 229 10*3/uL (ref 150–400)
Platelets: 180 10*3/uL (ref 150–400)
RBC: 2.67 MIL/uL — ABNORMAL LOW (ref 3.87–5.11)
RBC: 2.97 MIL/uL — ABNORMAL LOW (ref 3.87–5.11)
RDW: 18.2 % — ABNORMAL HIGH (ref 11.5–15.5)
RDW: 18.3 % — ABNORMAL HIGH (ref 11.5–15.5)
WBC: 19.4 10*3/uL — ABNORMAL HIGH (ref 4.0–10.5)
WBC: 16.8 10*3/uL — ABNORMAL HIGH (ref 4.0–10.5)
nRBC: 0.4 % — ABNORMAL HIGH (ref 0.0–0.2)
nRBC: 0.4 % — ABNORMAL HIGH (ref 0.0–0.2)

## 2018-12-31 LAB — PREPARE RBC (CROSSMATCH)

## 2018-12-31 LAB — GLUCOSE, CAPILLARY
Glucose-Capillary: 122 mg/dL — ABNORMAL HIGH (ref 70–99)
Glucose-Capillary: 133 mg/dL — ABNORMAL HIGH (ref 70–99)
Glucose-Capillary: 135 mg/dL — ABNORMAL HIGH (ref 70–99)
Glucose-Capillary: 138 mg/dL — ABNORMAL HIGH (ref 70–99)
Glucose-Capillary: 149 mg/dL — ABNORMAL HIGH (ref 70–99)
Glucose-Capillary: 172 mg/dL — ABNORMAL HIGH (ref 70–99)
Glucose-Capillary: 183 mg/dL — ABNORMAL HIGH (ref 70–99)
Glucose-Capillary: 184 mg/dL — ABNORMAL HIGH (ref 70–99)
Glucose-Capillary: 217 mg/dL — ABNORMAL HIGH (ref 70–99)
Glucose-Capillary: 231 mg/dL — ABNORMAL HIGH (ref 70–99)
Glucose-Capillary: 259 mg/dL — ABNORMAL HIGH (ref 70–99)
Glucose-Capillary: 45 mg/dL — ABNORMAL LOW (ref 70–99)

## 2018-12-31 LAB — CK: Total CK: 360 U/L — ABNORMAL HIGH (ref 38–234)

## 2018-12-31 LAB — UREA NITROGEN, URINE: Urea Nitrogen, Ur: 338 mg/dL

## 2018-12-31 LAB — MAGNESIUM: Magnesium: 2.4 mg/dL (ref 1.7–2.4)

## 2018-12-31 LAB — ABO/RH: ABO/RH(D): O POS

## 2018-12-31 MED ORDER — LEVETIRACETAM 100 MG/ML PO SOLN
500.0000 mg | Freq: Two times a day (BID) | ORAL | Status: DC
  Administered 2018-12-31 – 2019-01-03 (×6): 500 mg via ORAL
  Filled 2018-12-31 (×9): qty 5

## 2018-12-31 MED ORDER — SODIUM CHLORIDE 0.9 % IV SOLN
3.0000 g | Freq: Three times a day (TID) | INTRAVENOUS | Status: DC
  Administered 2018-12-31 – 2019-01-03 (×10): 3 g via INTRAVENOUS
  Filled 2018-12-31 (×13): qty 3

## 2018-12-31 MED ORDER — IPRATROPIUM-ALBUTEROL 0.5-2.5 (3) MG/3ML IN SOLN
3.0000 mL | Freq: Four times a day (QID) | RESPIRATORY_TRACT | Status: DC
  Administered 2018-12-31 – 2019-01-03 (×11): 3 mL via RESPIRATORY_TRACT
  Filled 2018-12-31 (×11): qty 3

## 2018-12-31 MED ORDER — SODIUM CHLORIDE 0.9% IV SOLUTION
Freq: Once | INTRAVENOUS | Status: AC
  Administered 2018-12-31: 11:00:00 via INTRAVENOUS

## 2018-12-31 MED ORDER — INSULIN DETEMIR 100 UNIT/ML ~~LOC~~ SOLN
12.0000 [IU] | Freq: Two times a day (BID) | SUBCUTANEOUS | Status: DC
  Administered 2018-12-31: 21:00:00 12 [IU] via SUBCUTANEOUS
  Filled 2018-12-31 (×2): qty 0.12

## 2018-12-31 MED ORDER — INSULIN ASPART 100 UNIT/ML ~~LOC~~ SOLN
2.0000 [IU] | SUBCUTANEOUS | Status: DC
  Administered 2018-12-31 (×3): 2 [IU] via SUBCUTANEOUS

## 2018-12-31 NOTE — TOC Initial Note (Signed)
Transition of Care [facility]) - Initial/Assessment Note    Patient Details  Name: Mary Raymond MRN: [id number] Date of Birth: 12/16/1950  Transition of [facility]) CM/SW Contact:    Midge Minium RN, BSN, NCM-BC, ACM-RN Phone Number:[phone] (working remotely) 12/31/2018, 4:05 PM  Clinical Narrative:                 Patient remains acutely ill on full vent support. CM is unable to complete the high-risk readmission assessment at this time. CM will continue to follow for progression of care and transitional needs.   Expected Discharge Plan: Skilled Nursing Facility Barriers to Discharge: Continued Medical Work up  Expected Discharge Plan and Services Expected Discharge Plan: [place]     Emotional Assessment   Attitude/Demeanor/Rapport: Intubated (Following Commands or Not Following Commands) Affect (typically observed): Unable to Assess   Admission diagnosis:  Ventricular fibrillation ([place]) [I49.01] Peripheral edema [R60.9] SOB (shortness of breath) [R06.02] ST elevation myocardial infarction (STEMI), unspecified artery (HCC) [I21.3] Acute on chronic congestive heart failure, unspecified heart failure type ([place]) [I50.9] Cardiac arrest [facility]) [I46.9] Patient Active Problem List   Diagnosis Date Noted  . Cardiac arrest ([place]) 12/24/2018  . 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 Acute ST elevation myocardial infarction (STEMI) of inferior wall ([place]) 12/22/2018  . VF (ventricular fibrillation) ([place]) 01/08/2019  . Ventricular fibrillation ([place])   . Acute on chronic congestive heart failure ([place])   . Endotracheally intubated   . FTT (failure to thrive) in adult   . Acute on chronic diastolic CHF (congestive heart failure) ([place])   . Anemia 12/08/2018  . Deep venous thrombosis ([place])   . Cardiac device in situ   . Cerebral embolism with cerebral infarction 06/09/2018  . ST elevation myocardial infarction (STEMI) ([place])   . Chest pain 06/05/2018  . Troponin level  elevated 06/05/2018  . Noncompliance 11/05/2017  . Genital herpes 08/02/2016  . Pain medication agreement signed 05/01/2016  . Uncomplicated opioid dependence ([place]) 05/01/2016  . Chronic back pain 08/10/2015  . 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 Depression 11/15/2014  . GERD (gastroesophageal reflux disease) 11/15/2014  . Hypertension associated with diabetes ([place])   . Hyperlipidemia associated with type 2 diabetes mellitus ([place])   . Insulin dependent diabetes mellitus ([place])   . Anxiety   . Obesity, morbid, BMI 40.0-49.9 ([place])   . Allergy   . Asthma   . Vitamin D deficiency   . Neuropathy   . Hypothyroidism 02/25/2013   PCP:  [name], FNP Pharmacy:   [facility], [place] [address] Phone: [phone] Fax: [phone]  [facility] [address], [place] [address] Phone: [phone] Fax: [phone]  [id number] - [place], [address] [place] [address] Phone: [phone] Fax: [phone]

## 2018-12-31 NOTE — Progress Notes (Addendum)
Inpatient Diabetes Program Recommendations  AACE/ADA: New Consensus Statement on Inpatient Glycemic Control (2015)  Target Ranges:  Prepandial:   less than 140 mg/dL      Peak postprandial:   less than 180 mg/dL (1-2 hours)      Critically ill patients:  140 - 180 mg/dL   Results for Mary Raymond, Mary Raymond (MRN [id number]) as of 12/31/2018 07:56  Ref. Range 12/30/2018 06:00 12/30/2018 07:47 12/30/2018 09:03 12/30/2018 09:58 12/30/2018 11:42 12/30/2018 15:52 12/30/2018 19:39 12/30/2018 21:02 12/30/2018 22:10  Glucose-Capillary Latest Ref Range: 70 - 99 mg/dL 136 (H) 144 (H) 158 (H) 160 (H) 149 (H) 231 (H) 254 (H) 228 (H) 213 (H)   Results for Mary Raymond, Mary Raymond (MRN [id number]) as of 12/31/2018 08:05  Ref. Range 12/31/2018 00:13 12/31/2018 01:17 12/31/2018 02:22 12/31/2018 03:30 12/31/2018 05:43 12/31/2018 06:54 12/31/2018 07:57  Glucose-Capillary Latest Ref Range: 70 - 99 mg/dL 183 (H) 172 (H) 122 (H) 149 (H) 135 (H) 133 (H) 45 (L)    Home DM Meds: Novolog 0-5 units per SSI       Lantus 28 units BID       Metformin 1000 mg BID  Current Orders: IV Insulin Drip      Levemir 5 units BID      Novolog 1-2-3 units Q4 hours      Novolog 1 unit Q4 hours (Tube feed coverage)     On Vent--Getting CRRT.  Tube Feeds Osmolite running at 20cc/hr.  Patient has been on and off the IV Insulin Drip multiple times since admission: IV Insulin Drip started 04/09 at 10pm IV Insulin Drip OFF 04/10 at 5pm IV Insulin Drip Back ON 04/11 at 5pm IV Insulin Drip Back OFF 04/13 at 5am IV Insulin Drip Back ON 04/13 at 8pm IV Insulin Drip Back OFF 04/14 at 10am IV Insulin Drip Back ON 04/14 at 8pm   To successfully keep patient off the IV Insulin Drip, we likely need to give her more Insulin.  Per records, patient take Lantus 28 units BID at home.   Recommend the following:  1. Increase Levemir to 12 units BID (0.2 units /kg dosing based on weight of 127 kg)--This would also be 50% total home dose basal insulin  2.  Increase Novolog Tube Feed Coverage to: Novolog 2 units Q4 hours     --Will follow patient during hospitalization--  Wyn Quaker RN, MSN, CDE Diabetes Coordinator Inpatient Glycemic Control Team Team Pager: [phone] (8a-5p)

## 2018-12-31 NOTE — Progress Notes (Signed)
PROGRESS NOTE    Mary Raymond  ZOX:[id number] DOB: Nov 12, 1950 DOA: 01/09/2019 [name], FNP   Brief Narrative:  68 year old WF PMHx CVA, severe CAD.  NSTEMI, Acute on chronic Diastolic CHF, s/p pacer HTN, CKD stage III, diabetes type 2 uncontrolled with complication, diabetic peripheral neuropathy, heart failure  Presents to the ED with shortness of breath.  Successfully diuresed and then suffered a ventricular fibrillation cardiac arrest around the time of discharge.  Received ACLS for 15 minutes.   Possible inferior STEMI on ECG.  Catheterization revealed restenosis of RCA stent not amenable to intervention.  Otherwise non-revascularizable coronary disease (reviewed by Dr. Claiborne Billings).   Initiated TTM to goal 32 Celsius 4/10, rewarmed 4/11.    Subjective: 4/15 with significant stimulation will follow some commands.  Otherwise unresponsive   Assessment & Plan:   Principal Problem:   Cardiac arrest [facility]) Active Problems:   ST elevation myocardial infarction (STEMI) ([place])   Coronary stent thrombosis   Acute ST elevation myocardial infarction (STEMI) of inferior wall (HCC)   VF (ventricular fibrillation) (HCC)   Ventricular fibrillation (HCC)   Acute on chronic congestive heart failure (HCC)   Endotracheally intubated   HLD (hyperlipidemia)   Acute respiratory failure with hypoxia (HCC)   Ventilator associated pneumonia (HCC)   Aspiration pneumonia (HCC)  Ventricular fib cardiac arrest 4/9 with inferior STEMI -Known severe CAD previously not surgical candidate - Coreg 3.125 mg twice daily     per cardiology - ASA 81 mg daily - Brilinta 90 mg twice daily - Transfuse for hemoglobin<8  Cardiogenic shock -S/p LHC with DES to RCA -Off pressors -Per cardiology no need for ICD at this time. - See V. Fib  Second-degree heart block -S/p pacer placement 10/ 2019 -Followed by Dr. Rayann Heman as outpatient  HLD - Crestor 10 mg daily   Acute encephalopathy s/p  cardiac arrest/seizures?  -Although multiple EEG did not specifically show seizure based on long term memory (LTM) there was concern for seizures. - Keppra 500 mg twice daily    Respiratory failure with hypoxia secondary to cardiac arrest -Currently on vent support - Would hold off on weaning protocol until nephrology complete CRRT course this would give patient best chance of staying on vent..   Ventilator associated pneumonia-aspiration pneumonia -Patient aspirated - Would complete 7 days of antibiotics - Frequent suctioning - DuoNeb QID -Pulmonary toilet - PCXR 4/16 pending  Pulmonary edema - CRRT per nephrology     Acute on chronic diastolic HF -Discharge wt 4/1 was 260 lbs (117.9 kg) -Strict in and out -Daily weight - Per previous note patient not responding to diuresis - Nephrology consulted for CRRT -4/15 goal CRRT -145m a day  Acute on CKD stage III-IV (baseline Cr 1.25-1.99) -Trend creatinine Recent Labs  Lab 12/29/18 0410 12/30/18 0409 12/30/18 1545 12/31/18 0324 12/31/18 1557  CREATININE 3.78* 7.70* 4.67* 3.27* 2.26*  - Improving but still significantly above baseline - Per notes daughter has been previously spoken to concerning possible need for CRRT and has agreed.  Diabetes type 2 uncontrolled with complication - 28/11hemoglobin A1c= 8.8 - Lipid panel pending - Levemir 12 units twice daily - NovoLog 4 units every 4 hours - Custom SSI  Hypothyroidism -50 mcg daily  Normocytic anemia (baseline  HgB~9)   Normocytic Anemia- baseline Hgb ~9 -Hemoglobin trending down no obvious sign of bleeding Recent Labs  Lab 12/28/18 0329 12/29/18 0410 12/30/18 0516 12/31/18 0324 12/31/18 1400  HGB 8.5* 7.7* 7.0* 6.8*  7.6*    Anxiety -Ativan as needed for that dyssynchrony     Best practice:  Diet: trickle tube feeds  Pain/Anxiety/Delirium protocol (if indicated): prn fentanyl VAP protocol (if indicated): Bundle in place DVT prophylaxis: sq  heparin GI prophylaxis: Pantoprazole Urinary catheter: Guide hemodynamic management Glucose control:  Phase 1 glycemic control. Levemir, SSI Mobility: Bedrest Code Status:DNR as per conversation with daughter.   Family Communication: 4/11 Disposition: ICU 68 year old woman with know severe CAD.  Acute on chronic diastolic heart failure Presents to the ED with shortness of breath.  Successfully diuresed and then suffered a ventricular fibrillation cardiac arrest around the time of discharge.  Received ACLS for 15 minutes.   Possible inferior STEMI on ECG.  Catheterization revealed restenosis of RCA stent not amenable to intervention.  Otherwise non-revascularizable coronary disease (reviewed by Dr. Claiborne Billings).   Initiated TTM to goal 32 Celsius 4/10, rewarmed 4/11.    DVT prophylaxis: Heparin subcu Code Status: DNR Family Communication: None Disposition Plan: TBD   Consultants:  CHF team Nephrology PCCM Neurology     Procedures/Significant Events:  4/9 left heart cath: EF 50%,-Ventricular fibrillation cardiac arrest due to late stent thrombosis of the right coronary proximal to mid stent (Type 4 B MI) noted to have 99% stenosis with TIMI grade II flow.   Globular thrombus was noted. -Successful PCI -Stable anatomy of the left coronary system with distal 30 to 40% left main, ostial to proximal 99% small to moderate sized circumflex, and irregularities in the proximal to mid LAD. 4/10 EEG:-Abnormal EEG due to a burst-suppression pattern seen throughout the tracing. Burst suppression pattern can be seen in a variety of circumstances, including anesthesia, drug intoxication, hypothermia, as well as cerebral anoxia.  4/11 rewarming 4/12 overnight EEG with video:This day 1 of continuous EEG monitoring with simultaneous video monitoring was abnormal for several  reasons-#1 background activities slowing suggestive of moderate  to severe encephalopathy of nonspecific etiologies.  -  Triphasic  waves suggestive of metabolic causes.   -#3  runs of rhythmic synchronized sharp triphasic waves suggestive of cortical , ictal  component cannot be completely ruled out.  Continuous monitoring  is recommended to ensure stability and improvement.  Clinical  correlation is advised.        I have personally reviewed and interpreted all radiology studies and my findings are as above.  VENTILATOR SETTINGS: Mode: PRVC Vt set: 550 Set rate: 18 FiO2: 40% PEEP: 5   Cultures   Antimicrobials: . Anti-infectives (From admission, onward)   Start     Ordered Stop   12/31/18 1100  Ampicillin-Sulbactam (UNASYN) 3 g in sodium chloride 0.9 % 100 mL IVPB     12/31/18 1027         Devices 7 mm cuffed trach 4/9>>   LINES / TUBES:  LEFT antecubital 4/15>>    Continuous Infusions:   prismasol BGK 4/2.5 500 mL/hr at 12/31/18 1255    prismasol BGK 4/2.5 500 mL/hr at 12/31/18 1255   sodium chloride Stopped (12/31/18 0843)   sodium chloride     sodium chloride     sodium chloride 10 mL/hr at 12/31/18 1132   ampicillin-sulbactam (UNASYN) IV Stopped (12/31/18 1203)   fentaNYL infusion INTRAVENOUS Stopped (12/29/18 1322)   norepinephrine (LEVOPHED) Adult infusion Stopped (12/29/18 1410)   prismasol BGK 4/2.5 1,500 mL/hr at 12/31/18 1630   propofol (DIPRIVAN) infusion Stopped (12/27/18 1738)     Objective: Vitals:   12/31/18 1400 12/31/18 1500 12/31/18 1545 12/31/18 1600  BP: (!) 104/46 (!) 98/51 (!) 100/58 (!) 118/57  Pulse: (!) 59 (!) 59 60 (!) 59  Resp: (!) 23 20 (!) 30 (!) 26  Temp:      TempSrc:      SpO2: 100% 100%  100%  Weight:      Height:        Intake/Output Summary (Last 24 hours) at 12/31/2018 1645 Last data filed at 12/31/2018 1600 Gross per 24 hour  Intake 2006.14 ml  Output 3269 ml  Net -1262.86 ml   Filed Weights   12/28/18 0500 12/30/18 0600 12/31/18 0500  Weight: 124.4 kg 129.9 kg 127.3 kg    Examination:  General: Responds  slightly to extreme stimuli will squeeze her fingers and wiggle toes, positive acute respiratory distress Eyes: negative scleral hemorrhage, negative anisocoria, negative icterus ENT: Negative Runny nose, negative gingival bleeding, Neck:  Negative scars, masses, torticollis, lymphadenopathy, JVD 7 mm cuffed trach in place negative sign of infection Lungs: Few bilateral rhonchi Rt>>>Lt, positive mild expiratory  wheezes, tachypneic Cardiovascular: Regular rate and rhythm without murmur gallop or rub normal S1 and S2 Abdomen: Morbidly obese, negative abdominal pain, nondistended, positive soft, bowel sounds, no rebound, no ascites, no appreciable mass Extremities: No significant cyanosis, clubbing, or edema bilateral lower extremities Skin: Negative rashes, lesions, ulcers Psychiatric: Unable to evaluate secondary to patient's intubation Central nervous system: Patient would follow some commands with extensive stimuli otherwise unable to evaluate .     Data Reviewed: Care during the described time interval was provided by me .  I have reviewed this patient's available data, including medical history, events of note, physical examination, and all test results as part of my evaluation.   CBC: Recent Labs  Lab 01/15/2019 0740  12/28/18 0329 12/29/18 0410 12/30/18 0516 12/31/18 0324 12/31/18 1400  WBC 11.3*   < > 14.7* 20.7* 17.0* 19.4* 16.8*  NEUTROABS 7.9*  --   --   --   --   --   --   HGB 9.4*   < > 8.5* 7.7* 7.0* 6.8* 7.6*  HCT 31.3*   < > 28.6* 25.3* 22.4* 22.0* 24.2*  MCV 84.4   < > 83.9 82.7 81.5 82.4 81.5  PLT 237   < > 230 230 229 229 180   < > = values in this interval not displayed.   Basic Metabolic Panel: Recent Labs  Lab 12/26/18 0415  12/28/18 0329 12/29/18 0410 12/30/18 0409 12/30/18 1545 12/31/18 0324 12/31/18 1557  NA 137   < > 139 138 140 140 139 138  K 3.1*   < > 4.6 4.9 4.7 4.6 4.8 4.4  CL 99   < > 104 105 104 105 101 102  CO2 18*   < > 20* 16* 19* 20*  21* 24  GLUCOSE 245*   < > 128* 162* 140* 239* 155* 246*  BUN 56*   < > 55* 69* 87* 94* 77* 57*  CREATININE 2.31*   < > 2.89* 3.78* 7.70* 4.67* 3.27* 2.26*  CALCIUM 9.0   < > 8.6* 7.6* 7.6* 7.5* 8.0* 8.0*  MG 1.9  --  1.7 1.8 2.1  --  2.4  --   PHOS  --    < > 7.6* 8.6* 8.3* 8.1* 5.4* 4.2   < > = values in this interval not displayed.   GFR: Estimated Creatinine Clearance: 32.5 mL/min (A) (by C-G formula based on SCr of 2.26 mg/dL (H)). Liver Function Tests: Recent Labs  Lab 12/29/2018  0740 12/26/18 0039 12/27/18 0123  12/29/18 0410 12/30/18 0409 12/30/18 1545 12/31/18 0324 12/31/18 1557  AST 18 67* QUESTIONABLE RESULTS, RECOMMEND RECOLLECT TO VERIFY  --   --   --   --   --   --   ALT 14 48* QUESTIONABLE RESULTS, RECOMMEND RECOLLECT TO VERIFY  --   --   --   --   --   --   ALKPHOS 99 115 QUESTIONABLE RESULTS, RECOMMEND RECOLLECT TO VERIFY  --   --   --   --   --   --   BILITOT 0.5 1.5* QUESTIONABLE RESULTS, RECOMMEND RECOLLECT TO VERIFY  --   --   --   --   --   --   PROT 7.5 6.2* 3.9*  --   --   --   --   --   --   ALBUMIN 3.4* 3.0* 1.9*   < > 2.1* 2.1* 1.9* 1.9* 1.9*   < > = values in this interval not displayed.   No results for input(s): LIPASE, AMYLASE in the last 168 hours. No results for input(s): AMMONIA in the last 168 hours. Coagulation Profile: Recent Labs  Lab 01/10/2019 2004 12/26/18 0415  INR 1.3* 1.2   Cardiac Enzymes: Recent Labs  Lab 12/28/2018 1156 01/09/2019 2004 12/26/18 0039 12/26/18 0256 12/26/18 1240 12/31/18 1249  CKTOTAL  --   --   --   --   --  360*  TROPONINI 0.11* 1.16* 2.05* 1.84* 1.73*  --    BNP (last 3 results) No results for input(s): PROBNP in the last 8760 hours. HbA1C: No results for input(s): HGBA1C in the last 72 hours. CBG: Recent Labs  Lab 12/31/18 0543 12/31/18 0654 12/31/18 0757 12/31/18 0800 12/31/18 1135  GLUCAP 135* 133* 45* 138* 184*   Lipid Profile: Recent Labs    12/28/18 1948  TRIG 146   Thyroid Function  Tests: No results for input(s): TSH, T4TOTAL, FREET4, T3FREE, THYROIDAB in the last 72 hours. Anemia Panel: No results for input(s): VITAMINB12, FOLATE, FERRITIN, TIBC, IRON, RETICCTPCT in the last 72 hours. Urine analysis:    Component Value Date/Time   COLORURINE AMBER (A) 12/30/2018 1212   APPEARANCEUR TURBID (A) 12/30/2018 1212   APPEARANCEUR Clear 02/25/2018 1050   LABSPEC 1.024 12/30/2018 1212   PHURINE 6.0 12/30/2018 1212   GLUCOSEU NEGATIVE 12/30/2018 1212   HGBUR MODERATE (A) 12/30/2018 1212   BILIRUBINUR NEGATIVE 12/30/2018 1212   BILIRUBINUR Negative 02/25/2018 1050   KETONESUR NEGATIVE 12/30/2018 1212   PROTEINUR 100 (A) 12/30/2018 1212   UROBILINOGEN negative 11/14/2015 0949   NITRITE NEGATIVE 12/30/2018 1212   LEUKOCYTESUR MODERATE (A) 12/30/2018 1212   Sepsis Labs: '@LABRCNTIP'$ (procalcitonin:4,lacticidven:4)  )No results found for this or any previous visit (from the past 240 hour(s)).       Radiology Studies: Ct Head Wo Contrast  Result Date: 12/30/2018 CLINICAL DATA:  Mental status changes. Hypoxic ischemic encephalopathy. EXAM: CT HEAD WITHOUT CONTRAST TECHNIQUE: Contiguous axial images were obtained from the base of the skull through the vertex without intravenous contrast. COMPARISON:  06/17/2018 FINDINGS: Brain: Symmetric low-density affecting the thalami with extension into the left midbrain consistent with deep brain hypoxic ischemic infarctions. Probable small acute infarction in the right occipital lobe. No sign of hemorrhage or swelling. No widespread cortical insult identified. No hydrocephalus. No extra-axial collection. Vascular: There is atherosclerotic calcification of the major vessels at the base of the brain. Skull: Normal Sinuses/Orbits: Ordinary mucosal thickening.  Orbits negative. Other: None  IMPRESSION: Bilateral thalamic infarctions with extension into the left midbrain. Small right occipital infarction. No hemorrhage or mass effect.  Electronically Signed   By: Nelson Chimes M.D.   On: 12/30/2018 10:56   Dg Chest Port 1 View  Result Date: 12/31/2018 CLINICAL DATA:  Central line placement EXAM: PORTABLE CHEST 1 VIEW COMPARISON:  Earlier same day FINDINGS: Right internal jugular central line is advanced, now 2 cm above the right atrium in the SVC. Endotracheal tube and orogastric or nasogastric 2 again seen. Pacemaker appears the same. Patchy bilateral pulmonary infiltrates, lower lobe predominant, persist. IMPRESSION: Right internal jugular central line advanced. Tip now in the SVC 2 cm above the right atrium. Electronically Signed   By: Nelson Chimes M.D.   On: 12/31/2018 10:27   Dg Chest Port 1 View  Result Date: 12/31/2018 CLINICAL DATA:  Central line placement. EXAM: PORTABLE CHEST 1 VIEW COMPARISON:  Radiograph of December 30, 2018. FINDINGS: Stable cardiomegaly. Atherosclerosis of thoracic aorta is noted. Endotracheal and nasogastric tubes are unchanged in position. Left-sided pacemaker is unchanged in position. Right internal jugular catheter is noted with distal tip in expected position of right brachiocephalic vein. No pneumothorax is noted. Mild right basilar atelectasis or infiltrate is noted. Stable left basilar atelectasis is noted. Bony thorax is unremarkable. IMPRESSION: Right internal jugular catheter tip has been pulled back significantly since prior exam, with tip now in expected position of right brachiocephalic vein. Otherwise stable support apparatus. Stable bibasilar opacities as described above. Aortic Atherosclerosis (ICD10-I70.0). Electronically Signed   By: Marijo Conception M.D.   On: 12/31/2018 09:39   Dg Chest Port 1 View  Result Date: 12/30/2018 CLINICAL DATA:  Central line placement. EXAM: PORTABLE CHEST 1 VIEW COMPARISON:  Chest x-ray dated December 28, 2018. FINDINGS: Interval placement of a right internal jugular central venous catheter with the tip in the mid SVC. Unchanged endotracheal and enteric tubes.  Unchanged left chest wall AICD. Stable cardiomegaly. Resolved interstitial edema. Persistent patchy opacity at the right lung base. Unchanged retrocardiac left lower lobe consolidation or atelectasis. No large pleural effusion. No pneumothorax. No acute osseous abnormality. IMPRESSION: 1. New right internal jugular central venous catheter with tip in the mid SVC. No pneumothorax. 2. Resolved pulmonary interstitial edema. 3. Unchanged patchy opacities in the right lower lobe, concerning for pneumonia or aspiration. 4. Unchanged retrocardiac left lower atelectasis versus consolidation. Electronically Signed   By: Titus Dubin M.D.   On: 12/30/2018 13:59        Scheduled Meds:  aspirin  81 mg Oral Daily   carvedilol  3.125 mg Per Tube BID WC   chlorhexidine gluconate (MEDLINE KIT)  15 mL Mouth Rinse BID   Chlorhexidine Gluconate Cloth  6 each Topical Daily   feeding supplement (OSMOLITE 1.5 CAL)  1,000 mL Per Tube Q24H   feeding supplement (PRO-STAT SUGAR FREE 64)  60 mL Per Tube QID   fentaNYL (SUBLIMAZE) injection  50 mcg Intravenous Once   heparin  5,000 Units Subcutaneous Q8H   insulin aspart  1-3 Units Subcutaneous Q4H   insulin aspart  2 Units Subcutaneous Q4H   insulin detemir  12 Units Subcutaneous Q12H   ipratropium-albuterol  3 mL Nebulization Q6H   levETIRAcetam  500 mg Oral BID   levothyroxine  50 mcg Per Tube Q0600   mouth rinse  15 mL Mouth Rinse 10 times per day   pantoprazole sodium  40 mg Per Tube Daily   rosuvastatin  10 mg Per Tube [id number]  sodium chloride flush  10-40 mL Intracatheter Q12H   ticagrelor  90 mg Oral BID   Continuous Infusions:   prismasol BGK 4/2.5 500 mL/hr at 12/31/18 1255    prismasol BGK 4/2.5 500 mL/hr at 12/31/18 1255   sodium chloride Stopped (12/31/18 0843)   sodium chloride     sodium chloride     sodium chloride 10 mL/hr at 12/31/18 1132   ampicillin-sulbactam (UNASYN) IV Stopped (12/31/18 1203)   fentaNYL  infusion INTRAVENOUS Stopped (12/29/18 1322)   norepinephrine (LEVOPHED) Adult infusion Stopped (12/29/18 1410)   prismasol BGK 4/2.5 1,500 mL/hr at 12/31/18 1630   propofol (DIPRIVAN) infusion Stopped (12/27/18 1738)     LOS: 6 days   The patient is critically ill with multiple organ systems failure and requires high complexity decision making for assessment and support, frequent evaluation and titration of therapies, application of advanced monitoring technologies and extensive interpretation of multiple databases. Critical Care Time devoted to patient care services described in this note  Time spent: 40 minutes     Sadiel Mota, Geraldo Docker, MD [facility] Pager [phone]  If 7PM-7AM, please contact night-coverage [email or web address] Password [facility] 12/31/2018, 4:45 PM

## 2018-12-31 NOTE — Progress Notes (Signed)
Blood consent was obtained from patient's daughter, Annissa Andreoni via telephone. Telephone consent was verified by second RN, Baldwin [place].

## 2018-12-31 NOTE — Progress Notes (Signed)
Subjective: She is back to following commands as she was a couple of days ago.  She still is drowsy, but consistently if she is awoken she follows commands.  Exam: Vitals:   12/31/18 1800 12/31/18 1900  BP: (!) 109/55 (!) 105/53  Pulse: (!) 59 (!) 59  Resp: (!) 26 (!) 29  Temp:    SpO2: 100% 100%   Gen: In bed, debated Resp: non-labored breathing, no acute distress Abd: soft, nt  Neuro: MS: Awakens to stimulation, follows commands bilaterally. CN: Endorses vision in both hemifields to wiggling fingers, crosses midline in both directions Motor: She gives thumbs up in bilateral hands and wiggles toes bilaterally Sensory: Endorses symmetric sensation  Impression: 68 year old female status post cardiac arrest with some question of seizure activity.  There was some question of cortical irritability on the EEG, and therefore I would continue her antiepileptics in the short-term.  When she is extubated and interactive, I think that I would stop antiepileptics at that time as I am not certain that seizures have played any role here.   Recommendations: 1) continue Keppra until she is extubated and would have a better idea if she was having intermittent episodes. 2) from an anoxic brain injury prognosis perspective, with her following commands bilaterally, I suspect that she has a reasonably good neurological prognosis. 3) neurology will sign off, please call with further questions or concerns.  Roland Rack, MD [facility] [phone]  If 7pm- 7am, please page neurology on call as listed in [place].

## 2018-12-31 NOTE — Progress Notes (Signed)
Patient ID: Mary Raymond, female   DOB: May 02, 1951, 68 y.o.   MRN: [id number] [facility] Progress Note   Assessment/ Plan:   1.  Acute kidney injury on chronic kidney disease stage IV:  Likely ATN following cardiac arrest/what appears to be a seizure episode.  Unfortunately remains anuric and we will continue CRRT at this time for management of azotemia to help improve neurological prognostication.  No evidence of renal recovery. 2.  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  Currently on treatment with aspirin and Brilinta. 3.  Acute encephalopathy status post cardiac arrest:  Remains encephalopathic and getting CRRT at this time for management of azotemia to help improve prognostication process. 4.  Acute respiratory failure: Ongoing ventilator support per CCM.  Subjective:   Without acute events overnight, mental status waxing and waning.  CRRT filter clotted once overnight.   Objective:   BP 112/62   Pulse 60   Temp (!) 97 F (36.1 C)   Resp (!) 23   Ht _0  (1.651 m)   Wt 127.3 kg   SpO2 100%   BMI 46.70 kg/m   Intake/Output Summary (Last 24 hours) at 12/31/2018 0751 Last data filed at 12/31/2018 0700 Gross per 24 hour  Intake 1483.63 ml  Output 1749 ml  Net -265.37 ml   Weight change: -2.6 kg  Physical Exam: Gen: Intubated, unresponsive CVS: Pulse regular rhythm, normal rate, S1 and S2 normal Resp: Ventilated breath sounds bilaterally Abd: Soft, obese, nontender Ext: Trace dependent edema, trace ankle edema  Imaging: Ct Head Wo Contrast  Result Date: 12/30/2018 CLINICAL DATA:  Mental status changes. Hypoxic ischemic encephalopathy. EXAM: CT HEAD WITHOUT CONTRAST TECHNIQUE: Contiguous axial images were obtained from the base of the skull through the vertex without intravenous contrast. COMPARISON:  06/17/2018 FINDINGS: Brain: Symmetric  low-density affecting the thalami with extension into the left midbrain consistent with deep brain hypoxic ischemic infarctions. Probable small acute infarction in the right occipital lobe. No sign of hemorrhage or swelling. No widespread cortical insult identified. No hydrocephalus. No extra-axial collection. Vascular: There is atherosclerotic calcification of the major vessels at the base of the brain. Skull: Normal Sinuses/Orbits: Ordinary mucosal thickening.  Orbits negative. 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 Small right occipital infarction. No hemorrhage or mass effect. Electronically Signed   By: Nelson Chimes M.D.   On: 12/30/2018 10:56   Dg Chest Port 1 View  Result Date: 12/30/2018 CLINICAL DATA:  Central line placement. EXAM: PORTABLE CHEST 1 VIEW COMPARISON:  Chest x-ray dated December 28, 2018. FINDINGS: Interval placement of a right internal jugular central venous catheter with the tip in the mid SVC. Unchanged endotracheal and enteric tubes. Unchanged left chest wall AICD. Stable cardiomegaly. Resolved interstitial edema. Persistent patchy opacity at the right lung base. Unchanged retrocardiac left lower lobe consolidation or atelectasis. No large pleural effusion. No pneumothorax. No acute osseous abnormality. IMPRESSION: 1. New right internal jugular central venous catheter with tip in the mid SVC. No pneumothorax. 2. Resolved pulmonary interstitial edema. 3. Unchanged patchy opacities in the right lower lobe, concerning for pneumonia or aspiration. 4. Unchanged retrocardiac left lower atelectasis versus consolidation. Electronically Signed   By: Titus Dubin M.D.   On: 12/30/2018 13:59    Labs: BMET Recent Labs  Lab 12/17/2018 2004  12/27/18 2993  12/27/18 1516 12/27/18 1645 12/28/18 0329 12/29/18 0410  12/30/18 0409 12/30/18 1545 12/31/18 0324  NA 135   < > 137   < > 136 136 139 138 140 140 139  K 4.9   < > 3.5   < > 4.3  4.4 4.6 4.9 4.7 4.6 4.8  CL 96*   < > 102   < > 102 102 104 105 104 105 101  CO2 20*   < > 16*   < > 15* 16* 20* 16* 19* 20* 21*  GLUCOSE 408*   < > 199*   < > 301* 301* 128* 162* 140* 239* 155*  BUN 56*   < > 51*   < > 50* 49* 55* 69* 87* 94* 77*  CREATININE 2.24*   < > 2.15*   < > 2.56* 2.58* 2.89* 3.78* 7.70* 4.67* 3.27*  CALCIUM 9.1   < > 8.8*   < > 8.4* 8.3* 8.6* 7.6* 7.6* 7.5* 8.0*  PHOS 6.2*  --  4.6  --   --   --  7.6* 8.6* 8.3* 8.1* 5.4*   < > = values in this interval not displayed.   CBC Recent Labs  Lab 12/17/2018 0740  12/28/18 0329 12/29/18 0410 12/30/18 0516 12/31/18 0324  WBC 11.3*   < > 14.7* 20.7* 17.0* 19.4*  NEUTROABS 7.9*  --   --   --   --   --   HGB 9.4*   < > 8.5* 7.7* 7.0* 6.8*  HCT 31.3*   < > 28.6* 25.3* 22.4* 22.0*  MCV 84.4   < > 83.9 82.7 81.5 82.4  PLT 237   < > 230 230 229 229   < > = values in this interval not displayed.    Medications:    . sodium chloride   Intravenous Once  . aspirin  81 mg Oral Daily  . carvedilol  3.125 mg Per Tube BID WC  . chlorhexidine gluconate (MEDLINE KIT)  15 mL Mouth Rinse BID  . Chlorhexidine Gluconate Cloth  6 each Topical Daily  . feeding supplement (OSMOLITE 1.5 CAL)  1,000 mL Per Tube Q24H  . feeding supplement (PRO-STAT SUGAR FREE 64)  60 mL Per Tube QID  . fentaNYL (SUBLIMAZE) injection  50 mcg Intravenous Once  . heparin  5,000 Units Subcutaneous Q8H  . insulin aspart  1 Units Subcutaneous Q4H  . insulin aspart  1-3 Units Subcutaneous Q4H  . insulin detemir  5 Units Subcutaneous Q12H  . levothyroxine  50 mcg Per Tube Q0600  . mouth rinse  15 mL Mouth Rinse 10 times per day  . pantoprazole sodium  40 mg Per Tube Daily  . rosuvastatin  10 mg Per Tube q1800  . sodium chloride flush  10-40 mL Intracatheter Q12H  . ticagrelor  90 mg Oral BID   Elmarie Shiley, MD 12/31/2018, 7:51 AM
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
## 2018-12-31 NOTE — Progress Notes (Signed)
Progress Note  Patient Name: Mary Raymond Date of Encounter: 12/31/2018  Primary Cardiologist: Carlyle Dolly, MD Primary electrophysiologist: [name], MD  Subjective   Remains on vent at 40%. Started on CVVHD yesterday. Weight down 6 pounds. Now waking up on vent and following commands.   Off amio. Rhythm stable. Now off NE. Tolerating low-dose carvedilol   Inpatient Medications    Scheduled Meds: . sodium chloride   Intravenous Once  . aspirin  81 mg Oral Daily  . carvedilol  3.125 mg Per Tube BID WC  . chlorhexidine gluconate (MEDLINE KIT)  15 mL Mouth Rinse BID  . Chlorhexidine Gluconate Cloth  6 each Topical Daily  . feeding supplement (OSMOLITE 1.5 CAL)  1,000 mL Per Tube Q24H  . feeding supplement (PRO-STAT SUGAR FREE 64)  60 mL Per Tube QID  . fentaNYL (SUBLIMAZE) injection  50 mcg Intravenous Once  . heparin  5,000 Units Subcutaneous Q8H  . insulin aspart  1 Units Subcutaneous Q4H  . insulin aspart  1-3 Units Subcutaneous Q4H  . insulin detemir  5 Units Subcutaneous Q12H  . levothyroxine  50 mcg Per Tube Q0600  . mouth rinse  15 mL Mouth Rinse 10 times per day  . pantoprazole sodium  40 mg Per Tube Daily  . rosuvastatin  10 mg Per Tube q1800  . sodium chloride flush  10-40 mL Intracatheter Q12H  . ticagrelor  90 mg Oral BID   Continuous Infusions: .  prismasol BGK 4/2.5 500 mL/hr at 12/30/18 1418  .  prismasol BGK 4/2.5 500 mL/hr at 12/30/18 1417  . sodium chloride Stopped (12/31/18 0843)  . sodium chloride    . sodium chloride    . sodium chloride    . fentaNYL infusion INTRAVENOUS Stopped (12/29/18 1322)  . levETIRAcetam 500 mg (12/31/18 4462)  . norepinephrine (LEVOPHED) Adult infusion Stopped (12/29/18 1410)  . prismasol BGK 4/2.5 1,500 mL/hr at 12/31/18 0849  . propofol (DIPRIVAN) infusion Stopped (12/27/18 1738)   PRN Meds: Place/Maintain arterial line **AND** sodium chloride, sodium chloride, sodium chloride, acetaminophen (TYLENOL)  oral liquid 160 mg/5 mL, fentaNYL, heparin, heparin, ipratropium-albuterol, LORazepam, sodium chloride flush   Vital Signs    Vitals:   12/31/18 0800 12/31/18 0900 12/31/18 0937 12/31/18 1000  BP: (!) 111/53 116/71 (!) 123/43 (!) 116/50  Pulse: 60 (!) 59 60 60  Resp: (!) 21 (!) 37 (!) 25 (!) 22  Temp: (!) 96.8 F (36 C) (!) 96.8 F (36 C)  (!) 97 F (36.1 C)  TempSrc: Bladder     SpO2: 100% 98%  100%  Weight:      Height:        Intake/Output Summary (Last 24 hours) at 12/31/2018 1005 Last data filed at 12/31/2018 0900 Gross per 24 hour  Intake 1451.78 ml  Output 2184 ml  Net -732.22 ml    I/O since admission: +739  Filed Weights   12/28/18 0500 12/30/18 0600 12/31/18 0500  Weight: 124.4 kg 129.9 kg 127.3 kg    Telemetry    AV paced in 60-70s Personally reviewed   ECG    ECG : AV paced at 61      Physical Exam   BP (!) 116/50   Pulse 60   Temp (!) 97 F (36.1 C)   Resp (!) 22   Ht _0  (1.651 m)   Wt 127.3 kg   SpO2 100%   BMI 46.70 kg/m  General: Sedated on vent   HEENT: normal Neck:  supple. JVP to jaw. RIJ dialysis cath Carotids 2+ bilat; no bruits. No lymphadenopathy or thryomegaly appreciated. Cor: PMI nondisplaced. Regular rate & rhythm. No rubs, gallops or murmurs. Lungs: clear Abdomen: obese soft, nontender, nondistended. No hepatosplenomegaly. No bruits or masses. Good bowel sounds. Extremities: no cyanosis, clubbing, rash, 1+  Edema  Right femoral TLC dislodged  Neuro: sedated on vent follows commands per RN   Labs    Chemistry Recent Labs  Lab 12/27/2018 0740  12/26/18 0039  12/27/18 0123  12/30/18 0409 12/30/18 1545 12/31/18 0324  NA 138   < > 135   < > QUESTIONABLE RESULTS, RECOMMEND RECOLLECT TO VERIFY   < > 140 140 139  K 4.2   < > 3.4*   < > QUESTIONABLE RESULTS, RECOMMEND RECOLLECT TO VERIFY   < > 4.7 4.6 4.8  CL 99   < > 97*   < > QUESTIONABLE RESULTS, RECOMMEND RECOLLECT TO VERIFY   < > 104 105 101  CO2 25   < > 15*   < >  QUESTIONABLE RESULTS, RECOMMEND RECOLLECT TO VERIFY   < > 19* 20* 21*  GLUCOSE 159*   < > 390*   < > 145*   < > 140* 239* 155*  BUN 56*   < > 58*   < > QUESTIONABLE RESULTS, RECOMMEND RECOLLECT TO VERIFY   < > 87* 94* 77*  CREATININE 1.68*   < > 2.31*   < > 1.56*   < > 7.70* 4.67* 3.27*  CALCIUM 9.6   < > 9.2   < > QUESTIONABLE RESULTS, RECOMMEND RECOLLECT TO VERIFY   < > 7.6* 7.5* 8.0*  PROT 7.5  --  6.2*  --  3.9*  --   --   --   --   ALBUMIN 3.4*  --  3.0*  --  1.9*   < > 2.1* 1.9* 1.9*  AST 18  --  67*  --  QUESTIONABLE RESULTS, RECOMMEND RECOLLECT TO VERIFY  --   --   --   --   ALT 14  --  48*  --  QUESTIONABLE RESULTS, RECOMMEND RECOLLECT TO VERIFY  --   --   --   --   ALKPHOS 99  --  115  --  QUESTIONABLE RESULTS, RECOMMEND RECOLLECT TO VERIFY  --   --   --   --   BILITOT 0.5  --  1.5*  --  QUESTIONABLE RESULTS, RECOMMEND RECOLLECT TO VERIFY  --   --   --   --   GFRNONAA 31*   < > 21*   < > 34*   < > 5* 9* 14*  GFRAA 36*   < > 25*   < > 39*   < > 6* 10* 16*  ANIONGAP 14   < > 23*   < > QUESTIONABLE RESULTS, RECOMMEND RECOLLECT TO VERIFY   < > 17* 15 17*   < > = values in this interval not displayed.     Hematology Recent Labs  Lab 12/29/18 0410 12/30/18 0516 12/31/18 0324  WBC 20.7* 17.0* 19.4*  RBC 3.06* 2.75* 2.67*  HGB 7.7* 7.0* 6.8*  HCT 25.3* 22.4* 22.0*  MCV 82.7 81.5 82.4  MCH 25.2* 25.5* 25.5*  MCHC 30.4 31.3 30.9  RDW 17.8* 18.1* 18.2*  PLT 230 229 229    Cardiac Enzymes Recent Labs  Lab 01/01/2019 2004 12/26/18 0039 12/26/18 0256 12/26/18 1240  TROPONINI 1.16* 2.05* 1.84* 1.73*   No results  for input(s): TROPIPOC in the last 168 hours.   BNP Recent Labs  Lab 12/29/2018 0740  BNP 145.0*     DDimer No results for input(s): DDIMER in the last 168 hours.   Lipid Panel     Component Value Date/Time   CHOL 120 10/27/2018 1133   CHOL 248 (H) 02/23/2013 1159   TRIG 146 12/28/2018 1948   TRIG 274 (H) 09/16/2013 0846   TRIG 359 (H) 02/23/2013 1159   HDL  45 10/27/2018 1133   HDL 45 09/16/2013 0846   HDL 45 02/23/2013 1159   CHOLHDL 2.7 10/27/2018 1133   CHOLHDL 4.3 06/09/2018 0650   VLDL 33 06/09/2018 0650   LDLCALC 25 10/27/2018 1133   LDLCALC 145 (H) 09/16/2013 0846   LDLCALC 131 (H) 02/23/2013 1159     Radiology    Ct Head Wo Contrast  Result Date: 12/30/2018 CLINICAL DATA:  Mental status changes. Hypoxic ischemic encephalopathy. EXAM: CT HEAD WITHOUT CONTRAST TECHNIQUE: Contiguous axial images were obtained from the base of the skull through the vertex without intravenous contrast. COMPARISON:  06/17/2018 FINDINGS: Brain: Symmetric low-density affecting the thalami with extension into the left midbrain consistent with deep brain hypoxic ischemic infarctions. Probable small acute infarction in the right occipital lobe. No sign of hemorrhage or swelling. No widespread cortical insult identified. No hydrocephalus. No extra-axial collection. Vascular: There is atherosclerotic calcification of the major vessels at the base of the brain. Skull: Normal Sinuses/Orbits: Ordinary mucosal thickening.  Orbits negative. 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 Small right occipital infarction. No hemorrhage or mass effect. Electronically Signed   By: Nelson Chimes M.D.   On: 12/30/2018 10:56   Dg Chest Port 1 View  Result Date: 12/31/2018 CLINICAL DATA:  Central line placement. EXAM: PORTABLE CHEST 1 VIEW COMPARISON:  Radiograph of December 30, 2018. FINDINGS: Stable cardiomegaly. Atherosclerosis of thoracic aorta is noted. Endotracheal and nasogastric tubes are unchanged in position. Left-sided pacemaker is unchanged in position. Right internal jugular catheter is noted with distal tip in expected position of right brachiocephalic vein. No pneumothorax is noted. Mild right basilar atelectasis or infiltrate is noted. Stable left basilar atelectasis is noted. Bony thorax is unremarkable. IMPRESSION: Right  internal jugular catheter tip has been pulled back significantly since prior exam, with tip now in expected position of right brachiocephalic vein. Otherwise stable support apparatus. Stable bibasilar opacities as described above. Aortic Atherosclerosis (ICD10-I70.0). Electronically Signed   By: Marijo Conception M.D.   On: 12/31/2018 09:39   Dg Chest Port 1 View  Result Date: 12/30/2018 CLINICAL DATA:  Central line placement. EXAM: PORTABLE CHEST 1 VIEW COMPARISON:  Chest x-ray dated December 28, 2018. FINDINGS: Interval placement of a right internal jugular central venous catheter with the tip in the mid SVC. Unchanged endotracheal and enteric tubes. Unchanged left chest wall AICD. Stable cardiomegaly. Resolved interstitial edema. Persistent patchy opacity at the right lung base. Unchanged retrocardiac left lower lobe consolidation or atelectasis. No large pleural effusion. No pneumothorax. No acute osseous abnormality. IMPRESSION: 1. New right internal jugular central venous catheter with tip in the mid SVC. No pneumothorax. 2. Resolved pulmonary interstitial edema. 3. Unchanged patchy opacities in the right lower lobe, concerning for pneumonia or aspiration. 4. Unchanged retrocardiac left lower atelectasis versus consolidation. Electronically Signed   By: Titus Dubin M.D.   On: 12/30/2018 13:59    Cardiac Studies   Emergent  cardiac catheterization December 25, 2018   Ventricular fibrillation cardiac  arrest due to late stent thrombosis of the right coronary proximal to mid stent (Type 4 B MI) noted to have 99% stenosis with TIMI grade II flow.  Globular thrombus was noted.  Successful angioplasty with high pressure using a 4.0 x 15 mm [place] balloon to 17 atm. x multiple inflations.  Mistakenly, it was reported by the control room that the stent was postdilated with a 3.75 balloon during the implantation in 2019.  Stable anatomy of the left coronary system with distal 30 to 40% left main, ostial to  proximal 99% small to moderate sized circumflex, and irregularities in the proximal to mid LAD.  Elevated LVEDP, 21 mmHg.  Inferior wall hypokinesis.  EF 50%.  RECOMMENDATIONS:   Loaded with Brilinta and discontinue Plavix  Discontinue IV Aggrastat 3 to 4 hours after Brilinta loading dose  Critical care medicine to determine whether or not cooling protocol is indicated and to assist with management including control of ventilator.  Overall poor prognosis given residual disease and the unfortunate occurrence of stent thrombosis in this patient with multiple comorbidities that prevented CABG from being performed.     Intervention      Patient Profile     PEARSON REASONS is a 68 y.o. female with a history of hypertension, hyperlipidemia, type 2 diabetes mellitus, diastolic heart failure, and CAD status post NSTEMI in September 2019 with finding of multivessel CAD (turned down for CABG) and status post DES x2 to the proximal and mid RCA at that time.  She suffered a VF cardiac arrest just prior to being discharged from [facility] yesterday and was transferred to [place]    1.  VF cardiac arrest 01/12/2019 secondary to subtotal RCA in-stent restenosis.   - peak trop 2.05 - s/p PCI to the 99% in-stent proximal RCA stenosis and a large dominant RCA vessel. - s/p [place] sun protocol.   - rhythm currently stable. Off NE. Tolerating low-dose carvedilol  - EF 55-60% by echo - Neuro exam improved   2. CAD/NSTEMI - s/p PCI RCA. Residual CAD with 99% densely calcified ostial circumflex stenosis & 50% proximal LAD stenosis. - LCX disease chronic not felt amenable to PCI at this time - Continue ASA, statin & Brilinta and carvedilol  3. AKI on CKD 3 - due to ATN (shock) +/- contrast) - baseline creatinine variable  (1.8-2.0) - CVVHD started 4/14/ CVP 14. Continue to pull - Renal following   4. Acute hypoxic respiratory failure - in setting of #1 -  CCM managing  - possible extubation when volume status permits  5.  Hyperlipidemia  - target LDL less than 70:  history of mixed hyperlipidemia, was on rosuvastatin 40 mg prior to admission.  Recent LDL in February 2020 was 25  6.  Diabetes mellitus with complication on insulin. - consider SGLT2i on d/c if creatinine improves  CRITICAL CARE Performed by: Glori Bickers  Total critical care time: 35 minutes  Critical care time was exclusive of separately billable procedures and treating other patients.  Critical care was necessary to treat or prevent imminent or life-threatening deterioration.  Critical care was time spent personally by me (independent of midlevel providers or residents) on the following activities: development of treatment plan with patient and/or surrogate as well as nursing, discussions with consultants, evaluation of patient's response to treatment, examination of patient, obtaining history from patient or surrogate, ordering and performing treatments and interventions, ordering and review of laboratory studies, ordering and review  of radiographic studies, pulse oximetry and re-evaluation of patient's condition.     Glori Bickers, MD  10:05 AM

## 2018-12-31 NOTE — Plan of Care (Signed)
  Problem: Cardiovascular: Goal: Vascular access site(s) Level 0-1 will be maintained Outcome: Progressing   Problem: Neurologic: Goal: Promote progressive neurologic recovery Outcome: Not Progressing   Problem: Skin Integrity: Goal: Risk for impaired skin integrity will be minimized. Outcome: Progressing   Problem: Clinical Measurements: Goal: Cardiovascular complication will be avoided Outcome: Progressing   Problem: Activity: Goal: Risk for activity intolerance will decrease Outcome: Not Progressing   Problem: Coping: Goal: Level of anxiety will decrease Outcome: Progressing   Problem: Elimination: Goal: Will not experience complications related to bowel motility Outcome: Progressing   Problem: Elimination: Goal: Will not experience complications related to urinary retention Outcome: Not Progressing   Problem: Pain Managment: Goal: General experience of comfort will improve Outcome: Progressing   Problem: Safety: Goal: Ability to remain free from injury will improve Outcome: Progressing

## 2018-12-31 NOTE — Progress Notes (Signed)
[place] Progress Note Patient Name: Mary Raymond DOB: 1951/05/26 MRN: [id number]   Date of Service  12/31/2018  HPI/Events of Note  Anemia - Hgb = 6.8.  eICU Interventions  Will transfuse 1 unit PRBC.      Intervention Category Major Interventions: Other:  Lysle Dingwall 12/31/2018, 5:49 AM

## 2018-12-31 NOTE — Progress Notes (Signed)
[place] Progress Note Patient Name: Mary Raymond DOB: [birth year] MRN: [id number]   Date of Service  12/31/2018  HPI/Events of Note  Multiple loose stools - Request for Flexiseal.   eICU Interventions  Will order Flexiseal.      Intervention Category Major Interventions: Other:  Lysle Dingwall 12/31/2018, 3:44 AM

## 2019-01-01 ENCOUNTER — Inpatient Hospital Stay (HOSPITAL_COMMUNITY): Payer: Medicare HMO

## 2019-01-01 ENCOUNTER — Ambulatory Visit: Payer: Medicare HMO | Admitting: Family

## 2019-01-01 LAB — CBC
HCT: 23.1 % — ABNORMAL LOW (ref 36.0–46.0)
HCT: 24.7 % — ABNORMAL LOW (ref 36.0–46.0)
Hemoglobin: 7.2 g/dL — ABNORMAL LOW (ref 12.0–15.0)
Hemoglobin: 7.5 g/dL — ABNORMAL LOW (ref 12.0–15.0)
MCH: 25.7 pg — ABNORMAL LOW (ref 26.0–34.0)
MCH: 25.1 pg — ABNORMAL LOW (ref 26.0–34.0)
MCHC: 31.2 g/dL (ref 30.0–36.0)
MCHC: 30.4 g/dL (ref 30.0–36.0)
MCV: 82.5 fL (ref 80.0–100.0)
MCV: 82.6 fL (ref 80.0–100.0)
Platelets: 170 10*3/uL (ref 150–400)
Platelets: 184 10*3/uL (ref 150–400)
RBC: 2.8 MIL/uL — ABNORMAL LOW (ref 3.87–5.11)
RBC: 2.99 MIL/uL — ABNORMAL LOW (ref 3.87–5.11)
RDW: 17.8 % — ABNORMAL HIGH (ref 11.5–15.5)
RDW: 17.9 % — ABNORMAL HIGH (ref 11.5–15.5)
WBC: 12.2 10*3/uL — ABNORMAL HIGH (ref 4.0–10.5)
WBC: 14.2 10*3/uL — ABNORMAL HIGH (ref 4.0–10.5)
nRBC: 0.9 % — ABNORMAL HIGH (ref 0.0–0.2)
nRBC: 0.7 % — ABNORMAL HIGH (ref 0.0–0.2)

## 2019-01-01 LAB — GLUCOSE, CAPILLARY
Glucose-Capillary: 238 mg/dL — ABNORMAL HIGH (ref 70–99)
Glucose-Capillary: 210 mg/dL — ABNORMAL HIGH (ref 70–99)
Glucose-Capillary: 205 mg/dL — ABNORMAL HIGH (ref 70–99)
Glucose-Capillary: 145 mg/dL — ABNORMAL HIGH (ref 70–99)
Glucose-Capillary: 189 mg/dL — ABNORMAL HIGH (ref 70–99)
Glucose-Capillary: 228 mg/dL — ABNORMAL HIGH (ref 70–99)

## 2019-01-01 LAB — RENAL FUNCTION PANEL
Albumin: 1.8 g/dL — ABNORMAL LOW (ref 3.5–5.0)
Albumin: 1.8 g/dL — ABNORMAL LOW (ref 3.5–5.0)
Anion gap: 12 (ref 5–15)
Anion gap: 9 (ref 5–15)
BUN: 55 mg/dL — ABNORMAL HIGH (ref 8–23)
BUN: 38 mg/dL — ABNORMAL HIGH (ref 8–23)
CO2: 22 mmol/L (ref 22–32)
CO2: 26 mmol/L (ref 22–32)
Calcium: 7.9 mg/dL — ABNORMAL LOW (ref 8.9–10.3)
Calcium: 8.2 mg/dL — ABNORMAL LOW (ref 8.9–10.3)
Chloride: 102 mmol/L (ref 98–111)
Chloride: 103 mmol/L (ref 98–111)
Creatinine, Ser: 2.23 mg/dL — ABNORMAL HIGH (ref 0.44–1.00)
Creatinine, Ser: 1.48 mg/dL — ABNORMAL HIGH (ref 0.44–1.00)
GFR calc Af Amer: 26 mL/min — ABNORMAL LOW (ref >60)
GFR calc Af Amer: 42 mL/min — ABNORMAL LOW (ref >60)
GFR calc non Af Amer: 22 mL/min — ABNORMAL LOW (ref >60)
GFR calc non Af Amer: 36 mL/min — ABNORMAL LOW (ref >60)
Glucose, Bld: 261 mg/dL — ABNORMAL HIGH (ref 70–99)
Glucose, Bld: 165 mg/dL — ABNORMAL HIGH (ref 70–99)
Phosphorus: 4.1 mg/dL (ref 2.5–4.6)
Phosphorus: 2.9 mg/dL (ref 2.5–4.6)
Potassium: 4.4 mmol/L (ref 3.5–5.1)
Potassium: 4.2 mmol/L (ref 3.5–5.1)
Sodium: 136 mmol/L (ref 135–145)
Sodium: 138 mmol/L (ref 135–145)

## 2019-01-01 LAB — BPAM RBC
Blood Product Expiration Date: 202004212359
ISSUE DATE / TIME: 202004151025
Unit Type and Rh: 9500

## 2019-01-01 LAB — TYPE AND SCREEN
ABO/RH(D): O POS
Antibody Screen: NEGATIVE
Unit division: 0

## 2019-01-01 LAB — LIPID PANEL
Cholesterol: 63 mg/dL (ref 0–200)
HDL: 17 mg/dL — ABNORMAL LOW (ref >40)
LDL Cholesterol: 13 mg/dL (ref 0–99)
Total CHOL/HDL Ratio: 3.7 RATIO
Triglycerides: 163 mg/dL — ABNORMAL HIGH (ref <150)
VLDL: 33 mg/dL (ref 0–40)

## 2019-01-01 LAB — MAGNESIUM: Magnesium: 2.4 mg/dL (ref 1.7–2.4)

## 2019-01-01 LAB — PREPARE RBC (CROSSMATCH)

## 2019-01-01 MED ORDER — SODIUM CHLORIDE 0.9% IV SOLUTION: Freq: Once | INTRAVENOUS | Status: DC

## 2019-01-01 MED ORDER — INSULIN DETEMIR 100 UNIT/ML ~~LOC~~ SOLN
10.0000 [IU] | Freq: Every day | SUBCUTANEOUS | Status: DC
  Administered 2019-01-01 – 2019-01-03 (×3): 10 [IU] via SUBCUTANEOUS
  Filled 2019-01-01 (×4): qty 0.1

## 2019-01-01 MED ORDER — INSULIN ASPART 100 UNIT/ML ~~LOC~~ SOLN
0.0000 [IU] | SUBCUTANEOUS | Status: DC
  Administered 2019-01-01 (×3): 5 [IU] via SUBCUTANEOUS
  Administered 2019-01-01: 2 [IU] via SUBCUTANEOUS
  Administered 2019-01-01: 8 [IU] via SUBCUTANEOUS
  Administered 2019-01-01: 5 [IU] via SUBCUTANEOUS
  Administered 2019-01-01: 3 [IU] via SUBCUTANEOUS
  Administered 2019-01-02 (×3): 2 [IU] via SUBCUTANEOUS
  Administered 2019-01-02: 3 [IU] via SUBCUTANEOUS
  Administered 2019-01-02: 2 [IU] via SUBCUTANEOUS
  Administered 2019-01-03: 3 [IU] via SUBCUTANEOUS
  Administered 2019-01-03: 2 [IU] via SUBCUTANEOUS
  Administered 2019-01-03: 3 [IU] via SUBCUTANEOUS
  Administered 2019-01-03: 2 [IU] via SUBCUTANEOUS

## 2019-01-01 NOTE — Progress Notes (Signed)
Assisted televisit to patient with the daughter.

## 2019-01-01 NOTE — Progress Notes (Signed)
[place] Progress Note Patient Name: Mary Raymond DOB: Feb 28, 1951 MRN: [id number]   Date of Service  01/01/2019  HPI/Events of Note  Hyperglycemia - Blood glucose = 259.  eICU Interventions  Will change to Q 4 hour moderate Novolog SSI.     Intervention Category Major Interventions: Hyperglycemia - active titration of insulin therapy  Nellie Chevalier [place] 01/01/2019, 12:01 AM

## 2019-01-01 NOTE — Progress Notes (Signed)
Nutrition Follow-up  RD working remotely.  DOCUMENTATION CODES:  Morbid obesity  INTERVENTION:  Continue TF via OGT: - Osmolite 1.5 @ 20 ml/hr (480 ml/day) - Pro-stat 60 ml QID  Tube feeding regimen provides1520kcal, 150grams of protein, and 332m of H2O (100% of needs).  NUTRITION DIAGNOSIS:   Inadequate oral intake related to inability to eat as evidenced by NPO status.  Ongoing, being addressed via TF  GOAL:   Provide needs based on ASPEN/SCCM guidelines  Met via TF  MONITOR:   Vent status, Labs, Weight trends, I & O's, TF tolerance  REASON FOR ASSESSMENT:   Ventilator    ASSESSMENT:   68year old female who presented to [facility] on 4/09 with SOB. PMH of CHF, severe CAD s/p stenting, IDDM, hypothyroidism, CKD stage III, HLD, HTN. Pt found unresponsive on [facility]SC and [facility] arrest with defib x 3, epi x 2 and amiodarone with ROSC after 15 minutes. EKG noted inferior STEMI. S/p cath lab with lot seen on previous RCA stent. Pt admitted to the ICU and started on TTM on 4/09.  4/11 - TF started 4/12 - episode of aspiration of TF 4/13 - started on trickle TF 4/14 - started on goal TF regimen, CRRT initiated, CT revealing bilateral thalamic infarcts  Per nephrology note this AM, pt remains anuric and volume overloaded and without evidence of renal recovery yet. Plan is to continue CRRT for the next 24 hours and reassess progress to determine need for iHD vs renal recovery.  Mental status waxing and waning, overall improved neurological exam. Off pressors and sedation.  Weight trending down since pt started on CRRT, still 12 lbs above measured admit weight. Per RN edema assessment, pt with mild pitting generalized edema and mild pitting edema of BUE and BLE. Noted edema improved from moderate to mild today.  OGT in place. Discussed TF with RN who reports pt tolerating TF well.  Patient is currently intubated on ventilator  support MV: 10.5 L/min Temp (24hrs), Avg:97.3 F (36.3 C), Min:96.4 F (35.8 C), Max:98.2 F (36.8 C) BP: 119/61 MAP: 77  Medications reviewed and include: SSI q 4 hours, Protonix, IV abx, NS @ 10 ml/hr  Labs reviewed: BUN 55 (H), creatinine 2.23 (H), HDL 17 (L), triglycerides 163 (H), hemoglobin 7.2 (L) CBG's: 210, 238, 259, 217, 231 x 24 hours  UOP: 50 ml x 24 hours CRRT output: 3446 ml x 24 hours Rectal tube: 225 ml x 24 hours I/O's: +5.3 L since admit  Diet Order:   Diet Order            Diet NPO time specified  Diet effective now              EDUCATION NEEDS:   Not appropriate for education at this time  Skin:  Skin Assessment: Reviewed RN Assessment  Last BM:  01/01/19 type 7 via rectal tube  Height:   Ht Readings from Last 1 Encounters:  12/26/2018 '5\' 5"'$  (1.651 m)    Weight:   Wt Readings from Last 1 Encounters:  01/01/19 126 kg    Ideal Body Weight:  56.8 kg  BMI:  Body mass index is 46.22 kg/m.  Estimated Nutritional Needs:   Kcal:  [address] Protein:  >/= 142 grams  Fluid:  >/= 1.5 L    KGaynell Face MS, RD, LDN Inpatient Clinical Dietitian Pager: [phone]/After Hours: [phone]

## 2019-01-01 NOTE — Progress Notes (Signed)
Patient ID: Mary Raymond, female   DOB: Jan 14, 1951, 68 y.o.   MRN: [id number] [facility] Progress Note   Assessment/ Plan:   1.  Acute kidney injury on chronic kidney disease stage IV:  Likely ATN following cardiac arrest/what appears to be a seizure episode.  Anuric and without evidence of renal recovery yet-I plan to continue CRRT for the next 24 hours and reassess progress to see if she can come off of this and transition to intermittent hemodialysis if needed or if has signs of recovery. 2.  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  Currently on treatment with aspirin and Brilinta. 3.  Acute encephalopathy status post cardiac arrest:  Per examination by neurology team yesterday, encephalopathy appears to be improving but she has waxing/waning mental status. 4.  Acute respiratory failure: Ongoing ventilator support per CCM.  Subjective:   Hyperglycemic overnight, improving neurological exam.   Objective:   BP (!) 117/53   Pulse 60   Temp 97.9 F (36.6 C) (Oral)   Resp 18   Ht '5\' 5"'$  (1.651 m)   Wt 126 kg   SpO2 100%   BMI 46.22 kg/m   Intake/Output Summary (Last 24 hours) at 01/01/2019 0748 Last data filed at 01/01/2019 0700 Gross per 24 hour  Intake 2032.04 ml  Output 3721 ml  Net -1688.96 ml   Weight change: -1.3 kg  Physical Exam: Gen: Intubated, unresponsive to calling out her name/light touch CVS: Pulse regular rhythm, normal rate, S1 and S2 normal Resp: Ventilated breath sounds bilaterally Abd: Soft, obese, nontender Ext: Trace dependent edema, no ankle/pedal edema  Imaging: Ct Head Wo Contrast  Result Date: 12/30/2018 CLINICAL DATA:  Mental status changes. Hypoxic ischemic encephalopathy. EXAM: CT HEAD WITHOUT CONTRAST TECHNIQUE: Contiguous axial images were obtained from the base of the skull through the vertex without intravenous  contrast. COMPARISON:  06/17/2018 FINDINGS: Brain: Symmetric low-density affecting the thalami with extension into the left midbrain consistent with deep brain hypoxic ischemic infarctions. Probable small acute infarction in the right occipital lobe. No sign of hemorrhage or swelling. No widespread cortical insult identified. No hydrocephalus. No extra-axial collection. Vascular: There is atherosclerotic calcification of the major vessels at the base of the brain. Skull: Normal Sinuses/Orbits: Ordinary mucosal thickening.  Orbits negative. 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 Small right occipital infarction. No hemorrhage or mass effect. Electronically Signed   By: Nelson Chimes M.D.   On: 12/30/2018 10:56   Dg Chest Port 1 View  Result Date: 12/31/2018 CLINICAL DATA:  Central line placement EXAM: PORTABLE CHEST 1 VIEW COMPARISON:  Earlier same day FINDINGS: Right internal jugular central line is advanced, now 2 cm above the right atrium in the SVC. Endotracheal tube and orogastric or nasogastric 2 again seen. Pacemaker appears the same. Patchy bilateral pulmonary infiltrates, lower lobe predominant, persist. IMPRESSION: Right internal jugular central line advanced. Tip now in the SVC 2 cm above the right atrium. Electronically Signed   By: Nelson Chimes M.D.   On: 12/31/2018 10:27   Dg Chest Port 1 View  Result Date: 12/31/2018 CLINICAL DATA:  Central line placement. EXAM: PORTABLE CHEST 1 VIEW COMPARISON:  Radiograph of December 30, 2018. FINDINGS: Stable cardiomegaly. Atherosclerosis of thoracic aorta is noted. Endotracheal and nasogastric tubes are unchanged in position. Left-sided pacemaker is unchanged in position. Right internal jugular catheter is noted with distal  tip in expected position of right brachiocephalic vein. No pneumothorax is noted. Mild right basilar atelectasis or infiltrate is noted. Stable left basilar atelectasis is noted. Bony  thorax is unremarkable. IMPRESSION: Right internal jugular catheter tip has been pulled back significantly since prior exam, with tip now in expected position of right brachiocephalic vein. Otherwise stable support apparatus. Stable bibasilar opacities as described above. Aortic Atherosclerosis (ICD10-I70.0). Electronically Signed   By: Marijo Conception M.D.   On: 12/31/2018 09:39   Dg Chest Port 1 View  Result Date: 12/30/2018 CLINICAL DATA:  Central line placement. EXAM: PORTABLE CHEST 1 VIEW COMPARISON:  Chest x-ray dated December 28, 2018. FINDINGS: Interval placement of a right internal jugular central venous catheter with the tip in the mid SVC. Unchanged endotracheal and enteric tubes. Unchanged left chest wall AICD. Stable cardiomegaly. Resolved interstitial edema. Persistent patchy opacity at the right lung base. Unchanged retrocardiac left lower lobe consolidation or atelectasis. No large pleural effusion. No pneumothorax. No acute osseous abnormality. IMPRESSION: 1. New right internal jugular central venous catheter with tip in the mid SVC. No pneumothorax. 2. Resolved pulmonary interstitial edema. 3. Unchanged patchy opacities in the right lower lobe, concerning for pneumonia or aspiration. 4. Unchanged retrocardiac left lower atelectasis versus consolidation. Electronically Signed   By: Titus Dubin M.D.   On: 12/30/2018 13:59    Labs: BMET Recent Labs  Lab 12/28/18 0329 12/29/18 0410 12/30/18 0409 12/30/18 1545 12/31/18 0324 12/31/18 1557 01/01/19 0317  NA 139 138 140 140 139 138 136  K 4.6 4.9 4.7 4.6 4.8 4.4 4.4  CL 104 105 104 105 101 102 102  CO2 20* 16* 19* 20* 21* 24 22  GLUCOSE 128* 162* 140* 239* 155* 246* 261*  BUN 55* 69* 87* 94* 77* 57* 55*  CREATININE 2.89* 3.78* 7.70* 4.67* 3.27* 2.26* 2.23*  CALCIUM 8.6* 7.6* 7.6* 7.5* 8.0* 8.0* 7.9*  PHOS 7.6* 8.6* 8.3* 8.1* 5.4* 4.2 4.1   CBC Recent Labs  Lab 12/30/18 0516 12/31/18 0324 12/31/18 1400 01/01/19 0317  WBC  17.0* 19.4* 16.8* 12.2*  HGB 7.0* 6.8* 7.6* 7.2*  HCT 22.4* 22.0* 24.2* 23.1*  MCV 81.5 82.4 81.5 82.5  PLT 229 229 180 170    Medications:    . sodium chloride   Intravenous Once  . aspirin  81 mg Oral Daily  . carvedilol  3.125 mg Per Tube BID WC  . chlorhexidine gluconate (MEDLINE KIT)  15 mL Mouth Rinse BID  . Chlorhexidine Gluconate Cloth  6 each Topical Daily  . feeding supplement (OSMOLITE 1.5 CAL)  1,000 mL Per Tube Q24H  . feeding supplement (PRO-STAT SUGAR FREE 64)  60 mL Per Tube QID  . fentaNYL (SUBLIMAZE) injection  50 mcg Intravenous Once  . heparin  5,000 Units Subcutaneous Q8H  . insulin aspart  0-15 Units Subcutaneous Q4H  . ipratropium-albuterol  3 mL Nebulization Q6H  . levETIRAcetam  500 mg Oral BID  . levothyroxine  50 mcg Per Tube Q0600  . mouth rinse  15 mL Mouth Rinse 10 times per day  . pantoprazole sodium  40 mg Per Tube Daily  . rosuvastatin  10 mg Per Tube q1800  . sodium chloride flush  10-40 mL Intracatheter Q12H  . ticagrelor  90 mg Oral BID   Elmarie Shiley, MD 01/01/2019, 7:48 AM

## 2019-01-01 NOTE — Plan of Care (Signed)
  Problem: Cardiovascular: Goal: Ability to achieve and maintain adequate cardiovascular perfusion will improve Outcome: Progressing   Problem: Neurologic: Goal: Promote progressive neurologic recovery Outcome: Progressing   Problem: Skin Integrity: Goal: Risk for impaired skin integrity will be minimized. Outcome: Progressing   Problem: Clinical Measurements: Goal: Ability to maintain clinical measurements within normal limits will improve Outcome: Progressing   Problem: Clinical Measurements: Goal: Cardiovascular complication will be avoided Outcome: Progressing   Problem: Activity: Goal: Risk for activity intolerance will decrease Outcome: Not Progressing   Problem: Coping: Goal: Level of anxiety will decrease Outcome: Progressing   Problem: Elimination: Goal: Will not experience complications related to bowel motility Outcome: Progressing   Problem: Pain Managment: Goal: General experience of comfort will improve Outcome: Progressing   Problem: Safety: Goal: Ability to remain free from injury will improve Outcome: Progressing

## 2019-01-01 NOTE — Progress Notes (Signed)
PROGRESS NOTE    Mary Raymond  WFU:[id number] DOB: Sep 11, 1951 DOA: 12/17/2018 [name], FNP   Brief Narrative:  68 year old WF PMHx CVA, severe CAD.  NSTEMI, Acute on chronic Diastolic CHF, s/p pacer HTN, CKD stage III, diabetes type 2 uncontrolled with complication, diabetic peripheral neuropathy, heart failure  Presents to the ED with shortness of breath.  Successfully diuresed and then suffered a ventricular fibrillation cardiac arrest around the time of discharge.  Received ACLS for 15 minutes.   Possible inferior STEMI on ECG.  Catheterization revealed restenosis of RCA stent not amenable to intervention.  Otherwise non-revascularizable coronary disease (reviewed by Dr. Claiborne Billings).   Initiated TTM to goal 32 Celsius 4/10, rewarmed 4/11.    Subjective: 4/16 with difficult stimulation will follow commands squeeze her hands wiggle her toes.  Otherwise unresponsive.    Assessment & Plan:   Principal Problem:   Cardiac arrest [facility]) Active Problems:   ST elevation myocardial infarction (STEMI) ([place])   Coronary stent thrombosis   Acute ST elevation myocardial infarction (STEMI) of inferior wall (HCC)   VF (ventricular fibrillation) (HCC)   Ventricular fibrillation (HCC)   Acute on chronic congestive heart failure (HCC)   Endotracheally intubated   HLD (hyperlipidemia)   Acute respiratory failure with hypoxia (HCC)   Ventilator associated pneumonia (HCC)   Aspiration pneumonia (HCC)  Ventricular fib cardiac arrest 4/9 with inferior STEMI -Known severe CAD previously not surgical candidate - Coreg 3.125 mg twice daily     per cardiology - ASA 81 mg daily - Brilinta 90 mg twice daily - Transfuse for hemoglobin<8: Although according to AHA guidelines a cardiac patient should receive blood products to maintain hemoglobin>8, owing to current shortage of PRBC unless patient is actively unstable will not transfuse unless hemoglobin <7  Cardiogenic shock -S/p LHC with  DES to RCA -Off pressors -Per cardiology no need for ICD at this time. - See V. Fib  Second-degree heart block -S/p pacer placement 10/ 2019 -Followed by Dr. Rayann Heman as outpatient  HLD - Crestor 10 mg daily   Acute encephalopathy s/p cardiac arrest/seizures?  -Although multiple EEG did not specifically show seizure based on long term memory (LTM) there was concern for seizures. - Keppra 500 mg twice daily    Respiratory failure with hypoxia secondary to cardiac arrest -Currently on vent support - Would hold off on weaning protocol until nephrology complete CRRT per nephrology note 4/16 we will continue CRRT for another 24 hours.    Ventilator associated pneumonia-aspiration pneumonia -Patient aspirated - Would complete 7 days of antibiotics - Frequent suctioning - DuoNeb QID -Pulmonary toilet - PCXR 4/16 pending  Pulmonary edema - CRRT per nephrology     Acute on chronic diastolic HF -Discharge wt 4/1 was 260 lbs (117.9 kg) -Strict in and out +4.5 L -Daily weight Filed Weights   12/30/18 0600 12/31/18 0500 01/01/19 0500  Weight: 129.9 kg 127.3 kg 126 kg  - Per previous note patient not responding to diuresis - Nephrology consulted for CRRT -4/15 goal CRRT -119m a day  Acute on CKD stage III-IV (baseline Cr 1.25-1.99) -Trend creatinine Recent Labs  Lab 12/30/18 0409 12/30/18 1545 12/31/18 0324 12/31/18 1557 01/01/19 0317  CREATININE 7.70* 4.67* 3.27* 2.26* 2.23*  - Improving but still significantly above baseline - Per notes daughter has been previously spoken to concerning possible need for CRRT and has agreed.  Diabetes type 2 uncontrolled with complication - 22/02hemoglobin A1c= 8.8 - 4/16 restart Levemir 10  units daily  - 4/16 moderate SSI  HLD - LDL within AHA/ADA guidelines - Continue Crestor 10 mg daily  Hypothyroidism -50 mcg daily  Normocytic Anemia- baseline Hgb ~9 -Hemoglobin trending down no obvious sign of bleeding Recent Labs  Lab  12/29/18 0410 12/30/18 0516 12/31/18 0324 12/31/18 1400 01/01/19 0317  HGB 7.7* 7.0* 6.8* 7.6* 7.2*    Anxiety -Ativan as needed for that dyssynchrony     Best practice:  Diet: trickle tube feeds  Pain/Anxiety/Delirium protocol (if indicated): prn fentanyl VAP protocol (if indicated): Bundle in place DVT prophylaxis: sq heparin GI prophylaxis: Pantoprazole Urinary catheter: Guide hemodynamic management Glucose control:  Phase 1 glycemic control. Levemir, SSI Mobility: Bedrest Code Status:DNR as per conversation with daughter.   Family Communication: 4/11 Disposition: ICU 68 year old woman with know severe CAD.  Acute on chronic diastolic heart failure Presents to the ED with shortness of breath.  Successfully diuresed and then suffered a ventricular fibrillation cardiac arrest around the time of discharge.  Received ACLS for 15 minutes.   Possible inferior STEMI on ECG.  Catheterization revealed restenosis of RCA stent not amenable to intervention.  Otherwise non-revascularizable coronary disease (reviewed by Dr. Claiborne Billings).   I    DVT prophylaxis: Heparin subcu Code Status: DNR Family Communication: None Disposition Plan: TBD   Consultants:  CHF team Nephrology PCCM Neurology     Procedures/Significant Events:  4/9 left heart cath: EF 50%,-Ventricular fibrillation cardiac arrest due to late stent thrombosis of the right coronary proximal to mid stent (Type 4 B MI) noted to have 99% stenosis with TIMI grade II flow.   Globular thrombus was noted. -Successful PCI -Stable anatomy of the left coronary system with distal 30 to 40% left main, ostial to proximal 99% small to moderate sized circumflex, and irregularities in the proximal to mid LAD. 4/10 EEG:-Abnormal EEG due to a burst-suppression pattern seen throughout the tracing. Burst suppression pattern can be seen in a variety of circumstances, including anesthesia, drug intoxication, hypothermia, as well as cerebral  anoxia. 4/10 initiatedtargeted temperature management (TTM) to goal 32 Celsius  4/11 rewarming 4/12 overnight EEG with video:This day 1 of continuous EEG monitoring with simultaneous video monitoring was abnormal for several  reasons-#1 background activities slowing suggestive of moderate  to severe encephalopathy of nonspecific etiologies.  - Triphasic  waves suggestive of metabolic causes.   -#3  runs of rhythmic synchronized sharp triphasic waves suggestive of cortical , ictal  component cannot be completely ruled out.  Continuous monitoring  is recommended to ensure stability and improvement.  Clinical  correlation is advised.        I have personally reviewed and interpreted all radiology studies and my findings are as above.  VENTILATOR SETTINGS: Mode: PRVC Vt set: 550 Set rate: 18 FiO2: 40% PEEP: 5   Cultures   Antimicrobials: . Anti-infectives (From admission, onward)   Start     Ordered Stop   12/31/18 1100  Ampicillin-Sulbactam (UNASYN) 3 g in sodium chloride 0.9 % 100 mL IVPB     12/31/18 1027         Devices 7 mm cuffed trach 4/9>>   LINES / TUBES:  LEFT antecubital 4/15>>    Continuous Infusions:   prismasol BGK 4/2.5 500 mL/hr at 12/31/18 1255    prismasol BGK 4/2.5 500 mL/hr at 12/31/18 1255   sodium chloride 10 mL/hr at 12/31/18 1900   sodium chloride     sodium chloride     sodium chloride 10 mL/hr  at 12/31/18 1132   ampicillin-sulbactam (UNASYN) IV 3 g (01/01/19 0224)   fentaNYL infusion INTRAVENOUS Stopped (12/29/18 1322)   norepinephrine (LEVOPHED) Adult infusion Stopped (12/29/18 1410)   prismasol BGK 4/2.5 1,500 mL/hr at 12/31/18 1630   propofol (DIPRIVAN) infusion Stopped (12/27/18 1738)     Objective: Vitals:   01/01/19 0400 01/01/19 0500 01/01/19 0600 01/01/19 0700  BP: (!) 110/47 (!) 120/51 126/61 (!) 117/53  Pulse: 60 (!) 59 65 60  Resp: (!) 27 19 (!) 28 18  Temp: 97.9 F (36.6 C)     TempSrc: Oral      SpO2: 100% 100% 100% 100%  Weight:  126 kg    Height:        Intake/Output Summary (Last 24 hours) at 01/01/2019 0733 Last data filed at 01/01/2019 0700 Gross per 24 hour  Intake 2032.04 ml  Output 3721 ml  Net -1688.96 ml   Filed Weights   12/30/18 0600 12/31/18 0500 01/01/19 0500  Weight: 129.9 kg 127.3 kg 126 kg   General: Heavy stimuli patient will squeeze her hands and wiggle her toes, positive  acute respiratory distress Eyes: negative scleral hemorrhage, negative anisocoria, negative icterus ENT: Negative Runny nose, negative gingival bleeding, Neck:  Negative scars, masses, torticollis, lymphadenopathy, JVD, #7 cuffed trach in place negative sign of infection or bleeding Lungs: Clear to auscultation bilaterally without wheezes or crackles Cardiovascular: Regular rate and rhythm without murmur gallop or rub normal S1 and S2 Abdomen: Morbidly obese negative abdominal pain, nondistended, positive soft, bowel sounds, no rebound, no ascites, no appreciable mass Extremities: No significant cyanosis, clubbing, or edema bilateral lower extremities Skin: Negative rashes, lesions, ulcers Psychiatric: Unable to evaluate secondary to patient's intubation Central nervous system: Patient follow commands to wiggle her toes and squeeze her fingers otherwise unresponsive  .     Data Reviewed: Care during the described time interval was provided by me .  I have reviewed this patient's available data, including medical history, events of note, physical examination, and all test results as part of my evaluation.   CBC: Recent Labs  Lab 01/10/2019 0740  12/29/18 0410 12/30/18 0516 12/31/18 0324 12/31/18 1400 01/01/19 0317  WBC 11.3*   < > 20.7* 17.0* 19.4* 16.8* 12.2*  NEUTROABS 7.9*  --   --   --   --   --   --   HGB 9.4*   < > 7.7* 7.0* 6.8* 7.6* 7.2*  HCT 31.3*   < > 25.3* 22.4* 22.0* 24.2* 23.1*  MCV 84.4   < > 82.7 81.5 82.4 81.5 82.5  PLT 237   < > 230 229 229 180 170   < > =  values in this interval not displayed.   Basic Metabolic Panel: Recent Labs  Lab 12/28/18 0329 12/29/18 0410 12/30/18 0409 12/30/18 1545 12/31/18 0324 12/31/18 1557 01/01/19 0317  NA 139 138 140 140 139 138 136  K 4.6 4.9 4.7 4.6 4.8 4.4 4.4  CL 104 105 104 105 101 102 102  CO2 20* 16* 19* 20* 21* 24 22  GLUCOSE 128* 162* 140* 239* 155* 246* 261*  BUN 55* 69* 87* 94* 77* 57* 55*  CREATININE 2.89* 3.78* 7.70* 4.67* 3.27* 2.26* 2.23*  CALCIUM 8.6* 7.6* 7.6* 7.5* 8.0* 8.0* 7.9*  MG 1.7 1.8 2.1  --  2.4  --  2.4  PHOS 7.6* 8.6* 8.3* 8.1* 5.4* 4.2 4.1   GFR: Estimated Creatinine Clearance: 32.7 mL/min (A) (by C-G formula based on SCr of 2.23 mg/dL (  H)). Liver Function Tests: Recent Labs  Lab 01/14/2019 0740 12/26/18 0039 12/27/18 0123  12/30/18 0409 12/30/18 1545 12/31/18 0324 12/31/18 1557 01/01/19 0317  AST 18 67* QUESTIONABLE RESULTS, RECOMMEND RECOLLECT TO VERIFY  --   --   --   --   --   --   ALT 14 48* QUESTIONABLE RESULTS, RECOMMEND RECOLLECT TO VERIFY  --   --   --   --   --   --   ALKPHOS 99 115 QUESTIONABLE RESULTS, RECOMMEND RECOLLECT TO VERIFY  --   --   --   --   --   --   BILITOT 0.5 1.5* QUESTIONABLE RESULTS, RECOMMEND RECOLLECT TO VERIFY  --   --   --   --   --   --   PROT 7.5 6.2* 3.9*  --   --   --   --   --   --   ALBUMIN 3.4* 3.0* 1.9*   < > 2.1* 1.9* 1.9* 1.9* 1.8*   < > = values in this interval not displayed.   No results for input(s): LIPASE, AMYLASE in the last 168 hours. No results for input(s): AMMONIA in the last 168 hours. Coagulation Profile: Recent Labs  Lab 12/28/2018 2004 12/26/18 0415  INR 1.3* 1.2   Cardiac Enzymes: Recent Labs  Lab 12/22/2018 1156 12/31/2018 2004 12/26/18 0039 12/26/18 0256 12/26/18 1240 12/31/18 1249  CKTOTAL  --   --   --   --   --  360*  TROPONINI 0.11* 1.16* 2.05* 1.84* 1.73*  --    BNP (last 3 results) No results for input(s): PROBNP in the last 8760 hours. HbA1C: No results for input(s): HGBA1C in the  last 72 hours. CBG: Recent Labs  Lab 12/31/18 1603 12/31/18 1933 12/31/18 2334 01/01/19 0434 01/01/19 0729  GLUCAP 231* 217* 259* 238* 210*   Lipid Profile: Recent Labs    01/01/19 0317  CHOL 63  HDL 17*  LDLCALC 13  TRIG 163*  CHOLHDL 3.7   Thyroid Function Tests: No results for input(s): TSH, T4TOTAL, FREET4, T3FREE, THYROIDAB in the last 72 hours. Anemia Panel: No results for input(s): VITAMINB12, FOLATE, FERRITIN, TIBC, IRON, RETICCTPCT in the last 72 hours. Urine analysis:    Component Value Date/Time   COLORURINE AMBER (A) 12/30/2018 1212   APPEARANCEUR TURBID (A) 12/30/2018 1212   APPEARANCEUR Clear 02/25/2018 1050   LABSPEC 1.024 12/30/2018 1212   PHURINE 6.0 12/30/2018 1212   GLUCOSEU NEGATIVE 12/30/2018 1212   HGBUR MODERATE (A) 12/30/2018 1212   BILIRUBINUR NEGATIVE 12/30/2018 1212   BILIRUBINUR Negative 02/25/2018 1050   KETONESUR NEGATIVE 12/30/2018 1212   PROTEINUR 100 (A) 12/30/2018 1212   UROBILINOGEN negative 11/14/2015 0949   NITRITE NEGATIVE 12/30/2018 1212   LEUKOCYTESUR MODERATE (A) 12/30/2018 1212   Sepsis Labs: _0 (procalcitonin:4,lacticidven:4)  )No results found for this or any previous visit (from the past 240 hour(s)).       Radiology Studies: Ct Head Wo Contrast  Result Date: 12/30/2018 CLINICAL DATA:  Mental status changes. Hypoxic ischemic encephalopathy. EXAM: CT HEAD WITHOUT CONTRAST TECHNIQUE: Contiguous axial images were obtained from the base of the skull through the vertex without intravenous contrast. COMPARISON:  06/17/2018 FINDINGS: Brain: Symmetric low-density affecting the thalami with extension into the left midbrain consistent with deep brain hypoxic ischemic infarctions. Probable small acute infarction in the right occipital lobe. No sign of hemorrhage or swelling. No widespread cortical insult identified. No hydrocephalus. No extra-axial collection. Vascular: There is atherosclerotic calcification  of the major  vessels at the base of the brain. Skull: Normal Sinuses/Orbits: Ordinary mucosal thickening.  Orbits negative. 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 Small right occipital infarction. No hemorrhage or mass effect. Electronically Signed   By: Nelson Chimes M.D.   On: 12/30/2018 10:56   Dg Chest Port 1 View  Result Date: 12/31/2018 CLINICAL DATA:  Central line placement EXAM: PORTABLE CHEST 1 VIEW COMPARISON:  Earlier same day FINDINGS: Right internal jugular central line is advanced, now 2 cm above the right atrium in the SVC. Endotracheal tube and orogastric or nasogastric 2 again seen. Pacemaker appears the same. Patchy bilateral pulmonary infiltrates, lower lobe predominant, persist. IMPRESSION: Right internal jugular central line advanced. Tip now in the SVC 2 cm above the right atrium. Electronically Signed   By: Nelson Chimes M.D.   On: 12/31/2018 10:27   Dg Chest Port 1 View  Result Date: 12/31/2018 CLINICAL DATA:  Central line placement. EXAM: PORTABLE CHEST 1 VIEW COMPARISON:  Radiograph of December 30, 2018. FINDINGS: Stable cardiomegaly. Atherosclerosis of thoracic aorta is noted. Endotracheal and nasogastric tubes are unchanged in position. Left-sided pacemaker is unchanged in position. Right internal jugular catheter is noted with distal tip in expected position of right brachiocephalic vein. No pneumothorax is noted. Mild right basilar atelectasis or infiltrate is noted. Stable left basilar atelectasis is noted. Bony thorax is unremarkable. IMPRESSION: Right internal jugular catheter tip has been pulled back significantly since prior exam, with tip now in expected position of right brachiocephalic vein. Otherwise stable support apparatus. Stable bibasilar opacities as described above. Aortic Atherosclerosis (ICD10-I70.0). Electronically Signed   By: Marijo Conception M.D.   On: 12/31/2018 09:39   Dg Chest Port 1 View  Result Date:  12/30/2018 CLINICAL DATA:  Central line placement. EXAM: PORTABLE CHEST 1 VIEW COMPARISON:  Chest x-ray dated December 28, 2018. FINDINGS: Interval placement of a right internal jugular central venous catheter with the tip in the mid SVC. Unchanged endotracheal and enteric tubes. Unchanged left chest wall AICD. Stable cardiomegaly. Resolved interstitial edema. Persistent patchy opacity at the right lung base. Unchanged retrocardiac left lower lobe consolidation or atelectasis. No large pleural effusion. No pneumothorax. No acute osseous abnormality. IMPRESSION: 1. New right internal jugular central venous catheter with tip in the mid SVC. No pneumothorax. 2. Resolved pulmonary interstitial edema. 3. Unchanged patchy opacities in the right lower lobe, concerning for pneumonia or aspiration. 4. Unchanged retrocardiac left lower atelectasis versus consolidation. Electronically Signed   By: Titus Dubin M.D.   On: 12/30/2018 13:59        Scheduled Meds:  aspirin  81 mg Oral Daily   carvedilol  3.125 mg Per Tube BID WC   chlorhexidine gluconate (MEDLINE KIT)  15 mL Mouth Rinse BID   Chlorhexidine Gluconate Cloth  6 each Topical Daily   feeding supplement (OSMOLITE 1.5 CAL)  1,000 mL Per Tube Q24H   feeding supplement (PRO-STAT SUGAR FREE 64)  60 mL Per Tube QID   fentaNYL (SUBLIMAZE) injection  50 mcg Intravenous Once   heparin  5,000 Units Subcutaneous Q8H   insulin aspart  0-15 Units Subcutaneous Q4H   ipratropium-albuterol  3 mL Nebulization Q6H   levETIRAcetam  500 mg Oral BID   levothyroxine  50 mcg Per Tube Q0600   mouth rinse  15 mL Mouth Rinse 10 times per day   pantoprazole sodium  40 mg Per Tube Daily   rosuvastatin  10 mg Per  Tube q1800   sodium chloride flush  10-40 mL Intracatheter Q12H   ticagrelor  90 mg Oral BID   Continuous Infusions:   prismasol BGK 4/2.5 500 mL/hr at 12/31/18 1255    prismasol BGK 4/2.5 500 mL/hr at 12/31/18 1255   sodium chloride 10  mL/hr at 12/31/18 1900   sodium chloride     sodium chloride     sodium chloride 10 mL/hr at 12/31/18 1132   ampicillin-sulbactam (UNASYN) IV 3 g (01/01/19 0224)   fentaNYL infusion INTRAVENOUS Stopped (12/29/18 1322)   norepinephrine (LEVOPHED) Adult infusion Stopped (12/29/18 1410)   prismasol BGK 4/2.5 1,500 mL/hr at 12/31/18 1630   propofol (DIPRIVAN) infusion Stopped (12/27/18 1738)     LOS: 7 days   The patient is critically ill with multiple organ systems failure and requires high complexity decision making for assessment and support, frequent evaluation and titration of therapies, application of advanced monitoring technologies and extensive interpretation of multiple databases. Critical Care Time devoted to patient care services described in this note  Time spent: 40 minutes     Xavia Kniskern, Geraldo Docker, MD [facility] Pager [phone]  If 7PM-7AM, please contact night-coverage [email or web address] Password [facility] 01/01/2019, 7:33 AM
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
## 2019-01-01 NOTE — Progress Notes (Signed)
Progress Note  Patient Name: MAYSEL MCCOLM Date of Encounter: 01/01/2019  Primary Cardiologist: Carlyle Dolly, MD Primary electrophysiologist: [name], MD  Subjective   Remains on vent at 40%. On CVVHD. Still anuric.   Sedated. Off pressors. Withdraws to pain.   Inpatient Medications    Scheduled Meds: . sodium chloride   Intravenous Once  . aspirin  81 mg Oral Daily  . carvedilol  3.125 mg Per Tube BID WC  . chlorhexidine gluconate (MEDLINE KIT)  15 mL Mouth Rinse BID  . Chlorhexidine Gluconate Cloth  6 each Topical Daily  . feeding supplement (OSMOLITE 1.5 CAL)  1,000 mL Per Tube Q24H  . feeding supplement (PRO-STAT SUGAR FREE 64)  60 mL Per Tube QID  . fentaNYL (SUBLIMAZE) injection  50 mcg Intravenous Once  . heparin  5,000 Units Subcutaneous Q8H  . insulin aspart  0-15 Units Subcutaneous Q4H  . ipratropium-albuterol  3 mL Nebulization Q6H  . levETIRAcetam  500 mg Oral BID  . levothyroxine  50 mcg Per Tube Q0600  . mouth rinse  15 mL Mouth Rinse 10 times per day  . pantoprazole sodium  40 mg Per Tube Daily  . rosuvastatin  10 mg Per Tube q1800  . sodium chloride flush  10-40 mL Intracatheter Q12H  . ticagrelor  90 mg Oral BID   Continuous Infusions: .  prismasol BGK 4/2.5 500 mL/hr at 12/31/18 1255  .  prismasol BGK 4/2.5 500 mL/hr at 12/31/18 1255  . sodium chloride 10 mL/hr at 01/01/19 0800  . sodium chloride    . sodium chloride    . sodium chloride 10 mL/hr at 12/31/18 1132  . ampicillin-sulbactam (UNASYN) IV 3 g (01/01/19 0224)  . fentaNYL infusion INTRAVENOUS Stopped (12/29/18 1322)  . norepinephrine (LEVOPHED) Adult infusion Stopped (12/29/18 1410)  . prismasol BGK 4/2.5 2,000 mL/hr at 01/01/19 0815  . propofol (DIPRIVAN) infusion Stopped (12/27/18 1738)   PRN Meds: Place/Maintain arterial line **AND** sodium chloride, sodium chloride, sodium chloride, acetaminophen (TYLENOL) oral liquid 160 mg/5 mL, fentaNYL, heparin, heparin, LORazepam,  sodium chloride flush   Vital Signs    Vitals:   01/01/19 0600 01/01/19 0700 01/01/19 0800 01/01/19 0831  BP: 126/61 (!) 117/53 (!) 126/53 109/62  Pulse: 65 60 (!) 59 60  Resp: (!) '28 18 19 '$ (!) 21  Temp:      TempSrc:      SpO2: 100% 100% 100%   Weight:      Height:        Intake/Output Summary (Last 24 hours) at 01/01/2019 0835 Last data filed at 01/01/2019 0800 Gross per 24 hour  Intake 1921.33 ml  Output 3557 ml  Net -1635.67 ml    I/O since admission: +739  Filed Weights   12/30/18 0600 12/31/18 0500 01/01/19 0500  Weight: 129.9 kg 127.3 kg 126 kg    Telemetry    AV paced in 60-70s Personally reviewed   ECG    ECG : AV paced at 61      Physical Exam   BP 109/62   Pulse 60   Temp 97.9 F (36.6 C) (Oral)   Resp (!) 21   Ht '5\' 5"'$  (1.651 m)   Wt 126 kg   SpO2 100%   BMI 46.22 kg/m  General:  Sedated on vent. Withdraws to pain HEENT: normal + ETT Neck: supple. RIJ trialysis. Carotids 2+ bilat; no bruits. No lymphadenopathy or thryomegaly appreciated. Cor: PMI nondisplaced. Regular rate & rhythm. No rubs, gallops or  murmurs. Lungs: clear Abdomen: obese soft, nontender, nondistended. No hepatosplenomegaly. No bruits or masses. Good bowel sounds. Extremities: no cyanosis, clubbing, rash, 1+ edema +SCDS Neuro: Sedated on vent. Withdraws to pain    Labs    Chemistry Recent Labs  Lab 12/26/18 0039  12/27/18 0123  12/31/18 0324 12/31/18 1557 01/01/19 0317  NA 135   < > QUESTIONABLE RESULTS, RECOMMEND RECOLLECT TO VERIFY   < > 139 138 136  K 3.4*   < > QUESTIONABLE RESULTS, RECOMMEND RECOLLECT TO VERIFY   < > 4.8 4.4 4.4  CL 97*   < > QUESTIONABLE RESULTS, RECOMMEND RECOLLECT TO VERIFY   < > 101 102 102  CO2 15*   < > QUESTIONABLE RESULTS, RECOMMEND RECOLLECT TO VERIFY   < > 21* 24 22  GLUCOSE 390*   < > 145*   < > 155* 246* 261*  BUN 58*   < > QUESTIONABLE RESULTS, RECOMMEND RECOLLECT TO VERIFY   < > 77* 57* 55*  CREATININE 2.31*   < > 1.56*   < >  3.27* 2.26* 2.23*  CALCIUM 9.2   < > QUESTIONABLE RESULTS, RECOMMEND RECOLLECT TO VERIFY   < > 8.0* 8.0* 7.9*  PROT 6.2*  --  3.9*  --   --   --   --   ALBUMIN 3.0*  --  1.9*   < > 1.9* 1.9* 1.8*  AST 67*  --  QUESTIONABLE RESULTS, RECOMMEND RECOLLECT TO VERIFY  --   --   --   --   ALT 48*  --  QUESTIONABLE RESULTS, RECOMMEND RECOLLECT TO VERIFY  --   --   --   --   ALKPHOS 115  --  QUESTIONABLE RESULTS, RECOMMEND RECOLLECT TO VERIFY  --   --   --   --   BILITOT 1.5*  --  QUESTIONABLE RESULTS, RECOMMEND RECOLLECT TO VERIFY  --   --   --   --   GFRNONAA 21*   < > 34*   < > 14* 22* 22*  GFRAA 25*   < > 39*   < > 16* 25* 26*  ANIONGAP 23*   < > QUESTIONABLE RESULTS, RECOMMEND RECOLLECT TO VERIFY   < > 17* 12 12   < > = values in this interval not displayed.     Hematology Recent Labs  Lab 12/31/18 0324 12/31/18 1400 01/01/19 0317  WBC 19.4* 16.8* 12.2*  RBC 2.67* 2.97* 2.80*  HGB 6.8* 7.6* 7.2*  HCT 22.0* 24.2* 23.1*  MCV 82.4 81.5 82.5  MCH 25.5* 25.6* 25.7*  MCHC 30.9 31.4 31.2  RDW 18.2* 18.3* 17.8*  PLT 229 180 170    Cardiac Enzymes Recent Labs  Lab 01/13/2019 2004 12/26/18 0039 12/26/18 0256 12/26/18 1240  TROPONINI 1.16* 2.05* 1.84* 1.73*   No results for input(s): TROPIPOC in the last 168 hours.   BNP No results for input(s): BNP, PROBNP in the last 168 hours.   DDimer No results for input(s): DDIMER in the last 168 hours.   Lipid Panel     Component Value Date/Time   CHOL 63 01/01/2019 0317   CHOL 120 10/27/2018 1133   CHOL 248 (H) 02/23/2013 1159   TRIG 163 (H) 01/01/2019 0317   TRIG 274 (H) 09/16/2013 0846   TRIG 359 (H) 02/23/2013 1159   HDL 17 (L) 01/01/2019 0317   HDL 45 10/27/2018 1133   HDL 45 09/16/2013 0846   HDL 45 02/23/2013 1159   CHOLHDL 3.7 01/01/2019  0317   VLDL 33 01/01/2019 0317   LDLCALC 13 01/01/2019 0317   LDLCALC 25 10/27/2018 1133   LDLCALC 145 (H) 09/16/2013 0846   LDLCALC 131 (H) 02/23/2013 1159     Radiology    Ct Head  Wo Contrast  Result Date: 12/30/2018 CLINICAL DATA:  Mental status changes. Hypoxic ischemic encephalopathy. EXAM: CT HEAD WITHOUT CONTRAST TECHNIQUE: Contiguous axial images were obtained from the base of the skull through the vertex without intravenous contrast. COMPARISON:  06/17/2018 FINDINGS: Brain: Symmetric low-density affecting the thalami with extension into the left midbrain consistent with deep brain hypoxic ischemic infarctions. Probable small acute infarction in the right occipital lobe. No sign of hemorrhage or swelling. No widespread cortical insult identified. No hydrocephalus. No extra-axial collection. Vascular: There is atherosclerotic calcification of the major vessels at the base of the brain. Skull: Normal Sinuses/Orbits: Ordinary mucosal thickening.  Orbits negative. 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 Small right occipital infarction. No hemorrhage or mass effect. Electronically Signed   By: Nelson Chimes M.D.   On: 12/30/2018 10:56   Dg Chest Port 1 View  Result Date: 01/01/2019 CLINICAL DATA:  Endotracheal tube assessment. Pneumonia. Respiratory failure. EXAM: PORTABLE CHEST 1 VIEW COMPARISON:  12/31/2018 FINDINGS: Left-sided pacemaker unchanged. Nasogastric tube courses into the region of the stomach and off the film as tip is not visualized. Right IJ central venous catheter has tip over the SVC. Endotracheal tube has tip 4 cm above the carina. Lungs are somewhat hypoinflated with persistent left perihilar and left basilar opacification with slight worsening over the left infrahilar region. Stable to slightly improved patchy opacification in the right base. No definite effusion. Stable cardiomegaly. Remainder of the exam is unchanged. IMPRESSION: Left perihilar and left basilar opacification for cyst with slight worsening in the left infrahilar region suggesting worsening infection. Mild interval improvement patchy  opacification in the right base. Stable cardiomegaly. Tubes and lines as described. Electronically Signed   By: Marin Olp M.D.   On: 01/01/2019 08:27   Dg Chest Port 1 View  Result Date: 12/31/2018 CLINICAL DATA:  Central line placement EXAM: PORTABLE CHEST 1 VIEW COMPARISON:  Earlier same day FINDINGS: Right internal jugular central line is advanced, now 2 cm above the right atrium in the SVC. Endotracheal tube and orogastric or nasogastric 2 again seen. Pacemaker appears the same. Patchy bilateral pulmonary infiltrates, lower lobe predominant, persist. IMPRESSION: Right internal jugular central line advanced. Tip now in the SVC 2 cm above the right atrium. Electronically Signed   By: Nelson Chimes M.D.   On: 12/31/2018 10:27   Dg Chest Port 1 View  Result Date: 12/31/2018 CLINICAL DATA:  Central line placement. EXAM: PORTABLE CHEST 1 VIEW COMPARISON:  Radiograph of December 30, 2018. FINDINGS: Stable cardiomegaly. Atherosclerosis of thoracic aorta is noted. Endotracheal and nasogastric tubes are unchanged in position. Left-sided pacemaker is unchanged in position. Right internal jugular catheter is noted with distal tip in expected position of right brachiocephalic vein. No pneumothorax is noted. Mild right basilar atelectasis or infiltrate is noted. Stable left basilar atelectasis is noted. Bony thorax is unremarkable. IMPRESSION: Right internal jugular catheter tip has been pulled back significantly since prior exam, with tip now in expected position of right brachiocephalic vein. Otherwise stable support apparatus. Stable bibasilar opacities as described above. Aortic Atherosclerosis (ICD10-I70.0). Electronically Signed   By: Marijo Conception M.D.   On: 12/31/2018 09:39   Dg Chest Port 1 View  Result Date:  12/30/2018 CLINICAL DATA:  Central line placement. EXAM: PORTABLE CHEST 1 VIEW COMPARISON:  Chest x-ray dated December 28, 2018. FINDINGS: Interval placement of a right internal jugular central venous  catheter with the tip in the mid SVC. Unchanged endotracheal and enteric tubes. Unchanged left chest wall AICD. Stable cardiomegaly. Resolved interstitial edema. Persistent patchy opacity at the right lung base. Unchanged retrocardiac left lower lobe consolidation or atelectasis. No large pleural effusion. No pneumothorax. No acute osseous abnormality. IMPRESSION: 1. New right internal jugular central venous catheter with tip in the mid SVC. No pneumothorax. 2. Resolved pulmonary interstitial edema. 3. Unchanged patchy opacities in the right lower lobe, concerning for pneumonia or aspiration. 4. Unchanged retrocardiac left lower atelectasis versus consolidation. Electronically Signed   By: Titus Dubin M.D.   On: 12/30/2018 13:59    Cardiac Studies   Emergent  cardiac catheterization December 25, 2018   Ventricular fibrillation cardiac arrest due to late stent thrombosis of the right coronary proximal to mid stent (Type 4 B MI) noted to have 99% stenosis with TIMI grade II flow.  Globular thrombus was noted.  Successful angioplasty with high pressure using a 4.0 x 15 mm Wilcox balloon to 17 atm. x multiple inflations.  Mistakenly, it was reported by the control room that the stent was postdilated with a 3.75 balloon during the implantation in 2019.  Stable anatomy of the left coronary system with distal 30 to 40% left main, ostial to proximal 99% small to moderate sized circumflex, and irregularities in the proximal to mid LAD.  Elevated LVEDP, 21 mmHg.  Inferior wall hypokinesis.  EF 50%.  RECOMMENDATIONS:   Loaded with Brilinta and discontinue Plavix  Discontinue IV Aggrastat 3 to 4 hours after Brilinta loading dose  Critical care medicine to determine whether or not cooling protocol is indicated and to assist with management including control of ventilator.  Overall poor prognosis given residual disease and the unfortunate occurrence of stent thrombosis in this patient with multiple  comorbidities that prevented CABG from being performed.     Intervention      Patient Profile     ZYRAH WISWELL is a 68 y.o. female with a history of hypertension, hyperlipidemia, type 2 diabetes mellitus, diastolic heart failure, and CAD status post NSTEMI in September 2019 with finding of multivessel CAD (turned down for CABG) and status post DES x2 to the proximal and mid RCA at that time.  She suffered a VF cardiac arrest just prior to being discharged from [facility] yesterday and was transferred to [facility]    1.  VF cardiac arrest 12/17/2018 secondary to subtotal RCA in-stent restenosis.   - peak trop 2.05 - s/p PCI to the 99% in-stent proximal RCA stenosis and a large dominant RCA vessel. - s/p Arctic sun protocol.   - rhythm currently stable. Off NE. Tolerating low-dose carvedilol  Amiodarone stopped  - EF 55-60% by echo - Neuro exam stable  2. CAD/NSTEMI - s/p PCI RCA. Residual CAD with 99% densely calcified ostial circumflex stenosis & 50% proximal LAD stenosis. - LCX disease chronic not felt amenable to PC - Continue ASA, statin & Brilinta and carvedilol  3. AKI on CKD 3 - due to ATN (shock) +/- contrast) - baseline creatinine variable  (1.8-2.0) - CVVHD started 4/14/ CVP 14. Remains anuric and volume overloaded. Continue to pull - Renal following   4. Acute hypoxic respiratory failure - in setting of #1 - CCM managing  -  possible extubation when volume and neuro status permits  5.  Hyperlipidemia  - target LDL less than 70:  history of mixed hyperlipidemia, was on rosuvastatin 40 mg prior to admission.  Recent LDL in February 2020 was 25  6.  Diabetes mellitus with complication on insulin. - consider SGLT2i on d/c if creatinine improves  CRITICAL CARE Performed by: Glori Bickers  Total critical care time: 35  minutes  Critical care time was exclusive of separately billable procedures and treating other patients.   Critical care was necessary to treat or prevent imminent or life-threatening deterioration.  Critical care was time spent personally by me (independent of midlevel providers or residents) on the following activities: development of treatment plan with patient and/or surrogate as well as nursing, discussions with consultants, evaluation of patient's response to treatment, examination of patient, obtaining history from patient or surrogate, ordering and performing treatments and interventions, ordering and review of laboratory studies, ordering and review of radiographic studies, pulse oximetry and re-evaluation of patient's condition.     Glori Bickers, MD  8:35 AM

## 2019-01-02 ENCOUNTER — Inpatient Hospital Stay (HOSPITAL_COMMUNITY): Payer: Medicare HMO

## 2019-01-02 DIAGNOSIS — G9340 Encephalopathy, unspecified: Secondary | ICD-10-CM

## 2019-01-02 LAB — RENAL FUNCTION PANEL
Albumin: 1.6 g/dL — ABNORMAL LOW (ref 3.5–5.0)
Anion gap: 10 (ref 5–15)
BUN: 39 mg/dL — ABNORMAL HIGH (ref 8–23)
CO2: 29 mmol/L (ref 22–32)
Calcium: 8.2 mg/dL — ABNORMAL LOW (ref 8.9–10.3)
Chloride: 101 mmol/L (ref 98–111)
Creatinine, Ser: 1.54 mg/dL — ABNORMAL HIGH (ref 0.44–1.00)
GFR calc Af Amer: 40 mL/min — ABNORMAL LOW (ref >60)
GFR calc non Af Amer: 35 mL/min — ABNORMAL LOW (ref >60)
Glucose, Bld: 198 mg/dL — ABNORMAL HIGH (ref 70–99)
Phosphorus: 2.2 mg/dL — ABNORMAL LOW (ref 2.5–4.6)
Potassium: 3.8 mmol/L (ref 3.5–5.1)
Sodium: 140 mmol/L (ref 135–145)

## 2019-01-02 LAB — GLUCOSE, CAPILLARY
Glucose-Capillary: 155 mg/dL — ABNORMAL HIGH (ref 70–99)
Glucose-Capillary: 145 mg/dL — ABNORMAL HIGH (ref 70–99)
Glucose-Capillary: 145 mg/dL — ABNORMAL HIGH (ref 70–99)
Glucose-Capillary: 138 mg/dL — ABNORMAL HIGH (ref 70–99)
Glucose-Capillary: 141 mg/dL — ABNORMAL HIGH (ref 70–99)

## 2019-01-02 LAB — MAGNESIUM: Magnesium: 2.4 mg/dL (ref 1.7–2.4)

## 2019-01-02 NOTE — Progress Notes (Signed)
Patient ID: Mary Raymond, female   DOB: 04-Dec-1950, 68 y.o.   MRN: [id number] [facility] Progress Note   Assessment/ Plan:   1.  Acute kidney injury on chronic kidney disease stage IV:  Likely from ATN, oliguric and without evidence of renal recovery.  Looking at her current labs, we have alleviated the azotemic component to her encephalopathy.  I will continue CRRT for the next 24 hours to try and volume unload her prior to discontinuing this therapy.  With no significant improvement of mental status, there is need to reevaluate goals of care and consider palliative care approach. 2.  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  Currently on treatment with aspirin and Brilinta. 3.  Acute encephalopathy status post cardiac arrest:  Now with development of right posterior inferior temporal lobe CVA; management per neurology. 4.  Acute respiratory failure: Ongoing ventilator support per CCM.  Subjective:   Overnight events noted-new acute versus subacute cortical infarct in the right posterior inferior temporal lobe (prompted by change in mental status/aspiration TFs).   Objective:   BP (!) 109/50   Pulse (!) 59   Temp 98.9 F (37.2 C) (Oral)   Resp 20   Ht '5\' 5"'$  (1.651 m)   Wt 124.2 kg   SpO2 100%   BMI 45.56 kg/m   Intake/Output Summary (Last 24 hours) at 01/02/2019 0726 Last data filed at 01/02/2019 0700 Gross per 24 hour  Intake 1290.35 ml  Output 2765 ml  Net -1474.65 ml   Weight change: -1.8 kg  Physical Exam: Gen: Intubated, unresponsive CVS: Pulse regular rhythm, normal rate, S1 and S2 normal Resp: Coarse/ventilated breath sounds bilaterally Abd: Soft, obese, nontender Ext: Trace dependent edema, no ankle/pedal edema  Imaging: Ct Head Wo Contrast  Result Date: 01/02/2019 CLINICAL DATA:  68 y/o F; ongoing ventilator support. Intracranial  infarction. EXAM: CT HEAD WITHOUT CONTRAST TECHNIQUE: Contiguous axial images were obtained from the base of the skull through the vertex without intravenous contrast. COMPARISON:  12/30/2018 CT head FINDINGS: Brain: Interval development of a small cortical infarction within the right posterior inferior temporal lobe. Stable distribution of small infarctions within the right occipital lobe as well as bilateral thalami which extend into the midbrain. No hemorrhage, mass effect, extra-axial collection, hydrocephalus, or herniation. Vascular: Calcific atherosclerosis of the carotid siphons and vertebral arteries. No hyperdense vessel identified. Skull: Normal. Negative for fracture or focal lesion. Sinuses/Orbits: Moderate diffuse paranasal sinus mucosal thickening and partial opacification of the mastoid air cells. Orbits are unremarkable. Other: None. IMPRESSION: 1. Interval development of a small late acute/subacute cortical infarction within the right posterior inferior temporal lobe. 2. Stable distribution of small infarctions within the right occipital lobe, bilateral thalami, and midbrain. 3. No hemorrhage, mass effect, or herniation. Electronically Signed   By: Kristine Garbe M.D.   On: 01/02/2019 02:58   Dg Chest Port 1 View  Result Date: 01/01/2019 CLINICAL DATA:  Endotracheal tube assessment. Pneumonia. Respiratory failure. EXAM: PORTABLE CHEST 1 VIEW COMPARISON:  12/31/2018 FINDINGS: Left-sided pacemaker unchanged. Nasogastric tube courses into the region of the stomach and off the film as tip is not visualized. Right IJ central venous catheter has tip over the SVC. Endotracheal tube has tip 4 cm above the carina. Lungs are somewhat hypoinflated with persistent left perihilar and left basilar opacification with slight worsening over the left infrahilar region. Stable to slightly improved patchy opacification in  the right base. No definite effusion. Stable cardiomegaly. Remainder of the exam is  unchanged. IMPRESSION: Left perihilar and left basilar opacification for cyst with slight worsening in the left infrahilar region suggesting worsening infection. Mild interval improvement patchy opacification in the right base. Stable cardiomegaly. Tubes and lines as described. Electronically Signed   By: Marin Olp M.D.   On: 01/01/2019 08:27   Dg Chest Port 1 View  Result Date: 12/31/2018 CLINICAL DATA:  Central line placement EXAM: PORTABLE CHEST 1 VIEW COMPARISON:  Earlier same day FINDINGS: Right internal jugular central line is advanced, now 2 cm above the right atrium in the SVC. Endotracheal tube and orogastric or nasogastric 2 again seen. Pacemaker appears the same. Patchy bilateral pulmonary infiltrates, lower lobe predominant, persist. IMPRESSION: Right internal jugular central line advanced. Tip now in the SVC 2 cm above the right atrium. Electronically Signed   By: Nelson Chimes M.D.   On: 12/31/2018 10:27   Dg Chest Port 1 View  Result Date: 12/31/2018 CLINICAL DATA:  Central line placement. EXAM: PORTABLE CHEST 1 VIEW COMPARISON:  Radiograph of December 30, 2018. FINDINGS: Stable cardiomegaly. Atherosclerosis of thoracic aorta is noted. Endotracheal and nasogastric tubes are unchanged in position. Left-sided pacemaker is unchanged in position. Right internal jugular catheter is noted with distal tip in expected position of right brachiocephalic vein. No pneumothorax is noted. Mild right basilar atelectasis or infiltrate is noted. Stable left basilar atelectasis is noted. Bony thorax is unremarkable. IMPRESSION: Right internal jugular catheter tip has been pulled back significantly since prior exam, with tip now in expected position of right brachiocephalic vein. Otherwise stable support apparatus. Stable bibasilar opacities as described above. Aortic Atherosclerosis (ICD10-I70.0). Electronically Signed   By: Marijo Conception M.D.   On: 12/31/2018 09:39    Labs: BMET Recent Labs  Lab  12/30/18 0409 12/30/18 1545 12/31/18 0324 12/31/18 1557 01/01/19 0317 01/01/19 1600 01/02/19 0436  NA 140 140 139 138 136 138 140  K 4.7 4.6 4.8 4.4 4.4 4.2 3.8  CL 104 105 101 102 102 103 101  CO2 19* 20* 21* '24 22 26 29  '$ GLUCOSE 140* 239* 155* 246* 261* 165* 198*  BUN 87* 94* 77* 57* 55* 38* 39*  CREATININE 7.70* 4.67* 3.27* 2.26* 2.23* 1.48* 1.54*  CALCIUM 7.6* 7.5* 8.0* 8.0* 7.9* 8.2* 8.2*  PHOS 8.3* 8.1* 5.4* 4.2 4.1 2.9 2.2*   CBC Recent Labs  Lab 12/31/18 0324 12/31/18 1400 01/01/19 0317 01/01/19 1157  WBC 19.4* 16.8* 12.2* 14.2*  HGB 6.8* 7.6* 7.2* 7.5*  HCT 22.0* 24.2* 23.1* 24.7*  MCV 82.4 81.5 82.5 82.6  PLT 229 180 170 184    Medications:    . sodium chloride   Intravenous Once  . aspirin  81 mg Oral Daily  . carvedilol  3.125 mg Per Tube BID WC  . chlorhexidine gluconate (MEDLINE KIT)  15 mL Mouth Rinse BID  . Chlorhexidine Gluconate Cloth  6 each Topical Daily  . feeding supplement (OSMOLITE 1.5 CAL)  1,000 mL Per Tube Q24H  . feeding supplement (PRO-STAT SUGAR FREE 64)  60 mL Per Tube QID  . fentaNYL (SUBLIMAZE) injection  50 mcg Intravenous Once  . heparin  5,000 Units Subcutaneous Q8H  . insulin aspart  0-15 Units Subcutaneous Q4H  . insulin detemir  10 Units Subcutaneous Daily  . ipratropium-albuterol  3 mL Nebulization Q6H  . levETIRAcetam  500 mg Oral BID  . levothyroxine  50 mcg Per Tube Q0600  . mouth  rinse  15 mL Mouth Rinse 10 times per day  . pantoprazole sodium  40 mg Per Tube Daily  . rosuvastatin  10 mg Per Tube q1800  . sodium chloride flush  10-40 mL Intracatheter Q12H  . ticagrelor  90 mg Oral BID   Elmarie Shiley, MD 01/02/2019, 7:26 AM

## 2019-01-02 NOTE — Progress Notes (Signed)
[facility] Progress Note Patient Name: Mary Raymond DOB: [birth year] MRN: [id number]   Date of Service  01/02/2019  HPI/Events of Note  Altered LOC - Head CT Scan w/o contrast reveals: 1. Interval development of a small late acute/subacute cortical infarction within the right posterior inferior temporal lobe. 2. Stable distribution of small infarctions within the right occipital lobe, bilateral thalami, and midbrain. 3. No hemorrhage, mass effect, or herniation.  eICU Interventions  Plan: 1. I have spoken to Dr. Cheral Marker (Neruology) about re-evaluating this patient. He will see her.      Intervention Category Major Interventions: Change in mental status - evaluation and management  Kincaid Tiger Eugene 01/02/2019, 3:33 AM

## 2019-01-02 NOTE — Plan of Care (Signed)
  Problem: Cardiovascular: Goal: Ability to achieve and maintain adequate cardiovascular perfusion will improve Outcome: Progressing   Problem: Neurologic: Goal: Promote progressive neurologic recovery Outcome: Progressing   Problem: Skin Integrity: Goal: Risk for impaired skin integrity will be minimized. Outcome: Progressing   Problem: Clinical Measurements: Goal: Ability to maintain clinical measurements within normal limits will improve Outcome: Progressing   Problem: Activity: Goal: Risk for activity intolerance will decrease Outcome: Not Progressing   Problem: Coping: Goal: Level of anxiety will decrease Outcome: Progressing   Problem: Pain Managment: Goal: General experience of comfort will improve Outcome: Progressing   Problem: Safety: Goal: Ability to remain free from injury will improve Outcome: Progressing

## 2019-01-02 NOTE — Progress Notes (Signed)
Pt was transported to CT with no events to report.

## 2019-01-02 NOTE — Progress Notes (Signed)
Arrangements made with [employer] for video conference for 2100 tonight per daughter's, Mendel Ryder, request. No other requests at this time. Questions answered.

## 2019-01-02 NOTE — Progress Notes (Signed)
Endotracheal suctioning was done and tube feeds appeared to be coming out the patient's endotracheal tube. Tube feedings were stopped. Neuro assessment was performed and patient responded to painful stimulus but unable to follow commands. Elink was notified. Vitals are stable. Will continue to monitor.

## 2019-01-02 NOTE — Progress Notes (Addendum)
S/O: Neurology was called to re-evaluate the patient after CT head obtained for worsened mental status early this AM revealed a new inferior right temporal lobe hypodensity, felt most likely to be an ischemic infarction.   She has been followed by the neurology service for seizures and anoxic brain injury following cardiac arrest. Prior CT revealed a right occipital lobe small ischemic infarction and bilateral thalamic ischemic infarctions.   BP (!) 139/122   Pulse 60   Temp 98.2 F (36.8 C) (Oral)   Resp (!) 35   Ht 5\' 5"  (1.651 m)   Wt 126 kg   SpO2 97%   BMI 46.22 kg/m   Ment: Eyes closed; will not open to any stimuli. Nonverbal and does not respond to voice. Will move hands and arms towards sternal rub bilaterally.  CN: PERRL. No blink to threat. Face flaccidly symmetric. Motor/Sensory: Will move hands towards location of sternal rub but does not fully localize. Thrashes BLE to noxious plantar stimulation.   CT head: 1. Interval development of a small late acute/subacute cortical infarction within the right posterior inferior temporal lobe. 2. Stable distribution of small infarctions within the right occipital lobe, bilateral thalami, and midbrain. 3. No hemorrhage, mass effect, or herniation.  Assessment: 68 year old female with acute encephalopathy s/p cardiac arrest and multifocal ischemic infarctions. She had worsening mental status tonight, further evaluated with a CT brain, which revealed a new right posterior inferior temporal lobe acute/subacute ischemic infarction.  1. Given stroke recurrence and several recent strokes in separate vascular territories, a cardiac or aortic source is suspected. However, recent cardiac catheterization on 4/16 is also a risk factor for stroke secondary to dislodgement of plaque or small thrombus with distal embolization to brain and this is felt more likely to be the etiology.  2. Current medication regiment with prophylactic effect against  recurrent stroke consists of ASA and Brilinta. She is not on therapeutic anticoagulation.   Recommendations: 1. Unlikely that imaging of her cervical and intracranial circulation would change management. She is already on ASA with Plavix having been changed to Brilinta on 4/16 following cardiac catheterization.  2. Anticoagulation could be considered if not contraindicated and if the stroke is felt more likely to have been cardioembolic and spontaneous, rather than as a complication of the cardiac catheterization performed yesterday.  3. An MRI brain could also allow for further characterization of the strokes, but again, would not be likely to change management.   35 minutes spent in the neurological evaluation and management of this critically ill patient  Electronically signed: Dr. Kerney Elbe

## 2019-01-02 NOTE — Progress Notes (Signed)
Progress Note  Patient Name: Mary Raymond Date of Encounter: 01/02/2019  Primary Cardiologist: Carlyle Dolly, MD Primary electrophysiologist: [name], MD  Subjective   Remains on vent at 40%. On CVVHD pulling 100/hr. Weight down 13 pounds in 3 days. CVP 9. Still with minimal output.   Off pressors. More awake but won't relaibly follow commands   Rhythm stable  Inpatient Medications    Scheduled Meds: . sodium chloride   Intravenous Once  . aspirin  81 mg Oral Daily  . carvedilol  3.125 mg Per Tube BID WC  . chlorhexidine gluconate (MEDLINE KIT)  15 mL Mouth Rinse BID  . Chlorhexidine Gluconate Cloth  6 each Topical Daily  . feeding supplement (OSMOLITE 1.5 CAL)  1,000 mL Per Tube Q24H  . feeding supplement (PRO-STAT SUGAR FREE 64)  60 mL Per Tube QID  . fentaNYL (SUBLIMAZE) injection  50 mcg Intravenous Once  . heparin  5,000 Units Subcutaneous Q8H  . insulin aspart  0-15 Units Subcutaneous Q4H  . insulin detemir  10 Units Subcutaneous Daily  . ipratropium-albuterol  3 mL Nebulization Q6H  . levETIRAcetam  500 mg Oral BID  . levothyroxine  50 mcg Per Tube Q0600  . mouth rinse  15 mL Mouth Rinse 10 times per day  . pantoprazole sodium  40 mg Per Tube Daily  . rosuvastatin  10 mg Per Tube q1800  . sodium chloride flush  10-40 mL Intracatheter Q12H  . ticagrelor  90 mg Oral BID   Continuous Infusions: .  prismasol BGK 4/2.5 500 mL/hr at 01/01/19 2215  .  prismasol BGK 4/2.5 500 mL/hr at 01/01/19 1437  . sodium chloride Stopped (01/01/19 1500)  . sodium chloride    . sodium chloride    . sodium chloride 10 mL/hr at 12/31/18 1132  . ampicillin-sulbactam (UNASYN) IV 3 g (01/02/19 0402)  . fentaNYL infusion INTRAVENOUS Stopped (12/29/18 1322)  . norepinephrine (LEVOPHED) Adult infusion Stopped (12/29/18 1410)  . prismasol BGK 4/2.5 2,000 mL/hr at 01/01/19 1716  . propofol (DIPRIVAN) infusion Stopped (12/27/18 1738)   PRN Meds: Place/Maintain arterial line  **AND** sodium chloride, sodium chloride, sodium chloride, acetaminophen (TYLENOL) oral liquid 160 mg/5 mL, fentaNYL, heparin, heparin, LORazepam, sodium chloride flush   Vital Signs    Vitals:   01/02/19 0700 01/02/19 0730 01/02/19 0752 01/02/19 0800  BP: (!) 109/50  (!) 125/46 (!) 114/44  Pulse: (!) 59  60 (!) 59  Resp: 20   19  Temp:  98.8 F (37.1 C)    TempSrc:  Oral    SpO2: 100%   100%  Weight:      Height:        Intake/Output Summary (Last 24 hours) at 01/02/2019 0858 Last data filed at 01/02/2019 0800 Gross per 24 hour  Intake 1300.45 ml  Output 2805 ml  Net -1504.55 ml    I/O since admission: +739  Filed Weights   12/31/18 0500 01/01/19 0500 01/02/19 0449  Weight: 127.3 kg 126 kg 124.2 kg    Telemetry    AV paced in 60-70s Personally reviewed   ECG    ECG : AV paced at 61      Physical Exam   BP (!) 114/44 (BP Location: Right Arm)   Pulse (!) 59   Temp 98.8 F (37.1 C) (Oral)   Resp 19   Ht '5\' 5"'$  (1.651 m)   Wt 124.2 kg   SpO2 100%   BMI 45.56 kg/m  General:  Sedated  on vent. Withdraws to pain HEENT: normal + ETT Neck: supple. RIJ trialysis Carotids 2+ bilat; no bruits. No lymphadenopathy or thryomegaly appreciated. Cor: PMI nondisplaced. Regular rate & rhythm. No rubs, gallops or murmurs. Lungs: clear Abdomen: obese soft, nontender, nondistended. No hepatosplenomegaly. No bruits or masses. Good bowel sounds. Extremities: no cyanosis, clubbing, rash, trace edema Neuro:  Sedated on vent. Withdraws to pain    Labs    Chemistry Recent Labs  Lab 12/27/18 0123  01/01/19 0317 01/01/19 1600 01/02/19 0436  NA QUESTIONABLE RESULTS, RECOMMEND RECOLLECT TO VERIFY   < > 136 138 140  K QUESTIONABLE RESULTS, RECOMMEND RECOLLECT TO VERIFY   < > 4.4 4.2 3.8  CL QUESTIONABLE RESULTS, RECOMMEND RECOLLECT TO VERIFY   < > 102 103 101  CO2 QUESTIONABLE RESULTS, RECOMMEND RECOLLECT TO VERIFY   < > '22 26 29  '$ GLUCOSE 145*   < > 261* 165* 198*  BUN  QUESTIONABLE RESULTS, RECOMMEND RECOLLECT TO VERIFY   < > 55* 38* 39*  CREATININE 1.56*   < > 2.23* 1.48* 1.54*  CALCIUM QUESTIONABLE RESULTS, RECOMMEND RECOLLECT TO VERIFY   < > 7.9* 8.2* 8.2*  PROT 3.9*  --   --   --   --   ALBUMIN 1.9*   < > 1.8* 1.8* 1.6*  AST QUESTIONABLE RESULTS, RECOMMEND RECOLLECT TO VERIFY  --   --   --   --   ALT QUESTIONABLE RESULTS, RECOMMEND RECOLLECT TO VERIFY  --   --   --   --   ALKPHOS QUESTIONABLE RESULTS, RECOMMEND RECOLLECT TO VERIFY  --   --   --   --   BILITOT QUESTIONABLE RESULTS, RECOMMEND RECOLLECT TO VERIFY  --   --   --   --   GFRNONAA 34*   < > 22* 36* 35*  GFRAA 39*   < > 26* 42* 40*  ANIONGAP QUESTIONABLE RESULTS, RECOMMEND RECOLLECT TO VERIFY   < > '12 9 10   '$ < > = values in this interval not displayed.     Hematology Recent Labs  Lab 12/31/18 1400 01/01/19 0317 01/01/19 1157  WBC 16.8* 12.2* 14.2*  RBC 2.97* 2.80* 2.99*  HGB 7.6* 7.2* 7.5*  HCT 24.2* 23.1* 24.7*  MCV 81.5 82.5 82.6  MCH 25.6* 25.7* 25.1*  MCHC 31.4 31.2 30.4  RDW 18.3* 17.8* 17.9*  PLT 180 170 184    Cardiac Enzymes Recent Labs  Lab 12/26/18 1240  TROPONINI 1.73*   No results for input(s): TROPIPOC in the last 168 hours.   BNP No results for input(s): BNP, PROBNP in the last 168 hours.   DDimer No results for input(s): DDIMER in the last 168 hours.   Lipid Panel     Component Value Date/Time   CHOL 63 01/01/2019 0317   CHOL 120 10/27/2018 1133   CHOL 248 (H) 02/23/2013 1159   TRIG 163 (H) 01/01/2019 0317   TRIG 274 (H) 09/16/2013 0846   TRIG 359 (H) 02/23/2013 1159   HDL 17 (L) 01/01/2019 0317   HDL 45 10/27/2018 1133   HDL 45 09/16/2013 0846   HDL 45 02/23/2013 1159   CHOLHDL 3.7 01/01/2019 0317   VLDL 33 01/01/2019 0317   LDLCALC 13 01/01/2019 0317   LDLCALC 25 10/27/2018 1133   LDLCALC 145 (H) 09/16/2013 0846   LDLCALC 131 (H) 02/23/2013 1159     Radiology    Ct Head Wo Contrast  Result Date: 01/02/2019 CLINICAL DATA:  68 y/o F;  ongoing  ventilator support. Intracranial infarction. EXAM: CT HEAD WITHOUT CONTRAST TECHNIQUE: Contiguous axial images were obtained from the base of the skull through the vertex without intravenous contrast. COMPARISON:  12/30/2018 CT head FINDINGS: Brain: Interval development of a small cortical infarction within the right posterior inferior temporal lobe. Stable distribution of small infarctions within the right occipital lobe as well as bilateral thalami which extend into the midbrain. No hemorrhage, mass effect, extra-axial collection, hydrocephalus, or herniation. Vascular: Calcific atherosclerosis of the carotid siphons and vertebral arteries. No hyperdense vessel identified. Skull: Normal. Negative for fracture or focal lesion. Sinuses/Orbits: Moderate diffuse paranasal sinus mucosal thickening and partial opacification of the mastoid air cells. Orbits are unremarkable. Other: None. IMPRESSION: 1. Interval development of a small late acute/subacute cortical infarction within the right posterior inferior temporal lobe. 2. Stable distribution of small infarctions within the right occipital lobe, bilateral thalami, and midbrain. 3. No hemorrhage, mass effect, or herniation. Electronically Signed   By: Kristine Garbe M.D.   On: 01/02/2019 02:58   Dg Chest Port 1 View  Result Date: 01/02/2019 CLINICAL DATA:  Hypoxia. EXAM: PORTABLE CHEST 1 VIEW COMPARISON:  One-view chest x-ray 01/01/2019 FINDINGS: The heart is enlarged. Endotracheal tube terminates 4.5 cm above the carina, in satisfactory position. A right IJ sheath remains. Atherosclerotic calcifications are present at the aortic arch. Mild pulmonary vascular congestion is stable. Persistent bibasilar airspace opacities are noted. Lung volumes are slightly decreased. IMPRESSION: 1. Slight decrease in lung volumes with persistent bibasilar airspace opacities, likely atelectasis. 2. Support apparatus is stable. 3. Stable cardiomegaly and moderate  pulmonary vascular congestion. Electronically Signed   By: San Morelle M.D.   On: 01/02/2019 08:22   Dg Chest Port 1 View  Result Date: 01/01/2019 CLINICAL DATA:  Endotracheal tube assessment. Pneumonia. Respiratory failure. EXAM: PORTABLE CHEST 1 VIEW COMPARISON:  12/31/2018 FINDINGS: Left-sided pacemaker unchanged. Nasogastric tube courses into the region of the stomach and off the film as tip is not visualized. Right IJ central venous catheter has tip over the SVC. Endotracheal tube has tip 4 cm above the carina. Lungs are somewhat hypoinflated with persistent left perihilar and left basilar opacification with slight worsening over the left infrahilar region. Stable to slightly improved patchy opacification in the right base. No definite effusion. Stable cardiomegaly. Remainder of the exam is unchanged. IMPRESSION: Left perihilar and left basilar opacification for cyst with slight worsening in the left infrahilar region suggesting worsening infection. Mild interval improvement patchy opacification in the right base. Stable cardiomegaly. Tubes and lines as described. Electronically Signed   By: Marin Olp M.D.   On: 01/01/2019 08:27   Dg Chest Port 1 View  Result Date: 12/31/2018 CLINICAL DATA:  Central line placement EXAM: PORTABLE CHEST 1 VIEW COMPARISON:  Earlier same day FINDINGS: Right internal jugular central line is advanced, now 2 cm above the right atrium in the SVC. Endotracheal tube and orogastric or nasogastric 2 again seen. Pacemaker appears the same. Patchy bilateral pulmonary infiltrates, lower lobe predominant, persist. IMPRESSION: Right internal jugular central line advanced. Tip now in the SVC 2 cm above the right atrium. Electronically Signed   By: Nelson Chimes M.D.   On: 12/31/2018 10:27   Dg Chest Port 1 View  Result Date: 12/31/2018 CLINICAL DATA:  Central line placement. EXAM: PORTABLE CHEST 1 VIEW COMPARISON:  Radiograph of December 30, 2018. FINDINGS: Stable  cardiomegaly. Atherosclerosis of thoracic aorta is noted. Endotracheal and nasogastric tubes are unchanged in position. Left-sided pacemaker is unchanged in position. Right internal  jugular catheter is noted with distal tip in expected position of right brachiocephalic vein. No pneumothorax is noted. Mild right basilar atelectasis or infiltrate is noted. Stable left basilar atelectasis is noted. Bony thorax is unremarkable. IMPRESSION: Right internal jugular catheter tip has been pulled back significantly since prior exam, with tip now in expected position of right brachiocephalic vein. Otherwise stable support apparatus. Stable bibasilar opacities as described above. Aortic Atherosclerosis (ICD10-I70.0). Electronically Signed   By: Marijo Conception M.D.   On: 12/31/2018 09:39    Cardiac Studies   Emergent  cardiac catheterization December 25, 2018   Ventricular fibrillation cardiac arrest due to late stent thrombosis of the right coronary proximal to mid stent (Type 4 B MI) noted to have 99% stenosis with TIMI grade II flow.  Globular thrombus was noted.  Successful angioplasty with high pressure using a 4.0 x 15 mm Cohutta balloon to 17 atm. x multiple inflations.  Mistakenly, it was reported by the control room that the stent was postdilated with a 3.75 balloon during the implantation in 2019.  Stable anatomy of the left coronary system with distal 30 to 40% left main, ostial to proximal 99% small to moderate sized circumflex, and irregularities in the proximal to mid LAD.  Elevated LVEDP, 21 mmHg.  Inferior wall hypokinesis.  EF 50%.  RECOMMENDATIONS:   Loaded with Brilinta and discontinue Plavix  Discontinue IV Aggrastat 3 to 4 hours after Brilinta loading dose  Critical care medicine to determine whether or not cooling protocol is indicated and to assist with management including control of ventilator.  Overall poor prognosis given residual disease and the unfortunate occurrence of stent  thrombosis in this patient with multiple comorbidities that prevented CABG from being performed.     Intervention      Patient Profile     Mary Raymond is a 68 y.o. female with a history of hypertension, hyperlipidemia, type 2 diabetes mellitus, diastolic heart failure, and CAD status post NSTEMI in September 2019 with finding of multivessel CAD (turned down for CABG) and status post DES x2 to the proximal and mid RCA at that time.  She suffered a VF cardiac arrest just prior to being discharged from [facility] yesterday and was transferred to [place]    1.  VF cardiac arrest 01/05/2019 secondary to subtotal RCA in-stent restenosis.   - peak trop 2.05 - s/p PCI to the 99% in-stent proximal RCA stenosis and a large dominant RCA vessel. - s/p Arctic sun protocol.   - rhythm currently stable. Off NE. Tolerating low-dose carvedilol  Amiodarone stopped  - EF 55-60% by echo - Neuro following closely   2. CAD/NSTEMI - s/p PCI RCA. Residual CAD with 99% densely calcified ostial circumflex stenosis & 50% proximal LAD stenosis. - LCX disease chronic not felt amenable to PC - Continue ASA, statin & Brilinta and carvedilol  3. AKI on CKD 3 - due to ATN (shock) +/- contrast) - baseline creatinine variable  (1.8-2.0) - CVVHD started 4/14. Volume status improving with CVVHD. CVP 9. Remains anuric.  - Renal following - plan to continue CVVHD one more day   4. Acute hypoxic respiratory failure - in setting of #1 - CCM managing  - possible extubation when volume and neuro status permits  5.  Hyperlipidemia  - target LDL less than 70:  history of mixed hyperlipidemia, was on rosuvastatin 40 mg prior to admission.  Recent LDL in February 2020 was 25  6.  Diabetes mellitus with complication on insulin. - consider SGLT2i on d/c if creatinine improves  CRITICAL CARE Performed by: Glori Bickers  Total critical care time: 35  minutes  Critical care  time was exclusive of separately billable procedures and treating other patients.  Critical care was necessary to treat or prevent imminent or life-threatening deterioration.  Critical care was time spent personally by me (independent of midlevel providers or residents) on the following activities: development of treatment plan with patient and/or surrogate as well as nursing, discussions with consultants, evaluation of patient's response to treatment, examination of patient, obtaining history from patient or surrogate, ordering and performing treatments and interventions, ordering and review of laboratory studies, ordering and review of radiographic studies, pulse oximetry and re-evaluation of patient's condition.     Glori Bickers, MD  8:58 AM

## 2019-01-02 NOTE — Progress Notes (Signed)
Discussed with Mendel Ryder, patient's daughter, and Manuela Schwartz, patient's sister - plan will be to withdraw care tomorrow at around [place] asked if we can make exception for 5 people to be present; I explained our current restrictions of 4 people, but that I will ask; I provided alternative that 5th person can video conference in or face-time if this was not possible. Manuela Schwartz wonders process of keeping patient comfortable and managing dyspnea - I explained we would likely start IV opioids which will cover pain and air hunger and would have other medications available if needed. Family appreciative and no further questions.   Alphonzo Grieve, MD PGY3 Pager [phone]

## 2019-01-02 NOTE — Progress Notes (Signed)
Noted overnight events. She was making process and reliably following commands, but unfortunately has had a set back and now has new CT changes.   I agree with Dr. Cheral Marker that I do not think that this changes acute management, but with her not making progress, and in fact regressing, I am concerned with her overall neurological recovery.  I suspect the bilateral thalamic changes are due to anoxia, but the right temporal looks more embolic. She may have some improvement over time, but I am less hopefull than on previous discussions.   In any event, I think that recovery will be prolonged and in the setting of her other medical conditions, I agree that serious consideration would have to be made prior to proceeding with PEG, etc.   Roland Rack, MD [facility] [phone]  If 7pm- 7am, please page neurology on call as listed in [place].

## 2019-01-02 NOTE — Progress Notes (Signed)
Updated patient's daughter Mary Raymond, next of kin, on overnight events. We discussed that we're at a juncture where we need to consider trach/peg and likely LTAC placement as her recovery, if any will likely be more protracted than originally thought when she was initially making neurologic progress. I discussed the risks of the procedures and that she would still be at risk for aspiration and recurrent infections despite peg placement. On initial conversation, her daughter stated her mom would want her to try everything and would be open to pursuing trach/peg but wanted to discuss with her brother and her aunts.   She called back shortly afterwards; apparently her mom and aunt had sat down a few years ago and talked about patient's wishes for situations like this and that she would not want to be on prolonged life support, aggressive or life-prolonging procedures of any kind if there was low chance of becoming independent again. She had spoken to two other aunts and the patient's son who were all in agreement with these sentiments. Mary Raymond verbalized that Mary Raymond had been declining for several months now before this hospitalization and hasn't had a meaningful quality of life since September 2019.   The family is all in agreement in pursuing terminal extubation and transition to comfort care. I advised that we can arrange for 4 people to be at bedside during transition to comfort care if they desire. Mary Raymond stated she wants to be at bedside and will talk with the rest of her family, some of whom live about 4 hours away, to coordinate who will be with her.   Plan for terminal extubation and transition to comfort care likely tomorrow. Once we receive confirmation from Mary Raymond will stop CRRT, other wise continue current care until that time.   Discussed with Dr. Elsworth Soho and RN.  Alphonzo Grieve, MD PGY3 Pager [phone]

## 2019-01-02 NOTE — Progress Notes (Signed)
Dr Posey Pronto with Nephrology paged regarding high filter pressures on CRRT. Filter soon to clot and family planning on withdrawing care tomorrow at 1700. MD suggests stopping CRRT when filter clots. Will continue to monitor closely.

## 2019-01-02 NOTE — Progress Notes (Signed)
68 year old woman admitted 4/9 with V. fib arrest and inferior wall STEMI. Emergent cardiac cath showed restenosis of RCA stent and severe CAD not amenable to revascularization.  She underwent hypothermia protocol and had seizure-like activity after rewarming. Head CT 4/14 showed bilateral thalamic infarcts with extension into the left midbrain and small right occipital infarct.  She was opening eyes to name and squeezing fingers on 4/16 but then developed for mental status overnight and repeat head CT 4/17 showed new acute right posterior inferior temporal infarct Developed AKI requiring CRRT and remains oliguric She remains orally intubated, off pressors Afebrile On exam-unresponsive, tries to localize with right hand on deep painful stimulus, no posturing, does not follow commands, S1-S2 regular, decreased breath sounds bilateral, minimal tan secretions per RN, soft nontender abdomen, 2+ edema  Chest x-ray personally reviewed which shows ET tube in position and bilateral infrahilar infiltrates  Labs show stable renal function on CRRT, mild leukocytosis and anemia.  Impression/plan  Acute encephalopathy-some degree of anoxic injury and now new stroke which could have been related to embolic after cath or late development after initial injury. Neurology recommending anticoagulation if cardiac emboli is a consideration, doubt this is necessary.  Acute hypoxic respiratory failure-continue spontaneous breathing trials but poor mental status precludes extubation Continue Unasyn empiric for aspiration pneumonia Obtain respiratory cultures  AKI -plan is to continue CRRT for another 24 hours and then transition to intermittent dialysis, doubtful prognosis for renal recovery  CAD, severe-not amenable to revascularization On low-dose carvedilol , continue aspirin and Brilinta and statin  Proceed with goals of care discussion with daughter.  If we are to push forward, she will need tracheostomy to  get through this and prognosis is likely poor for full neurologic and renal recovery.  The patient is critically ill with multiple organ systems failure and requires high complexity decision making for assessment and support, frequent evaluation and titration of therapies, application of advanced monitoring technologies and extensive interpretation of multiple databases. Critical Care Time devoted to patient care services described in this note independent of APP/resident  time is 35 minutes.   Leanna Sato Elsworth Soho MD

## 2019-01-02 NOTE — Progress Notes (Signed)
[place] Progress Note Patient Name: ALEZA PEW DOB: 1950/10/08 MRN: [id number]   Date of Service  01/02/2019  HPI/Events of Note  Multiple issues: 1. Nursing concerned about aspiration and 1. Mental status change. - usually responds by squeezing hand and not only responds to painful stimuli. No sedation given according to nurse.   eICU Interventions  Will order: 1. Head CT Scan w/o contrast STAT. 2. Portable CXR in AM.      Intervention Category Major Interventions: Change in mental status - evaluation and management;Other:  Lysle Dingwall 01/02/2019, 1:59 AM

## 2019-01-03 LAB — GLUCOSE, CAPILLARY
Glucose-Capillary: 143 mg/dL — ABNORMAL HIGH (ref 70–99)
Glucose-Capillary: 153 mg/dL — ABNORMAL HIGH (ref 70–99)
Glucose-Capillary: 163 mg/dL — ABNORMAL HIGH (ref 70–99)
Glucose-Capillary: 161 mg/dL — ABNORMAL HIGH (ref 70–99)

## 2019-01-03 MED ORDER — GLYCOPYRROLATE 0.2 MG/ML IJ SOLN: 0.2000 mg | INTRAMUSCULAR | Status: DC | PRN

## 2019-01-03 MED ORDER — MORPHINE BOLUS VIA INFUSION
5.0000 mg | INTRAVENOUS | Status: DC | PRN
  Filled 2019-01-03: qty 5

## 2019-01-03 MED ORDER — ACETAMINOPHEN 325 MG PO TABS: 650.0000 mg | ORAL_TABLET | Freq: Four times a day (QID) | ORAL | Status: DC | PRN

## 2019-01-03 MED ORDER — DEXTROSE 5 % IV SOLN: INTRAVENOUS | Status: DC

## 2019-01-03 MED ORDER — DIPHENHYDRAMINE HCL 50 MG/ML IJ SOLN: 25.0000 mg | INTRAMUSCULAR | Status: DC | PRN

## 2019-01-03 MED ORDER — GLYCOPYRROLATE 1 MG PO TABS
1.0000 mg | ORAL_TABLET | ORAL | Status: DC | PRN
  Filled 2019-01-03: qty 1

## 2019-01-03 MED ORDER — ACETAMINOPHEN 650 MG RE SUPP: 650.0000 mg | Freq: Four times a day (QID) | RECTAL | Status: DC | PRN

## 2019-01-03 MED ORDER — MORPHINE 100MG IN NS 100ML (1MG/ML) PREMIX INFUSION
0.0000 mg/h | INTRAVENOUS | Status: DC
  Administered 2019-01-03: 5 mg/h via INTRAVENOUS
  Filled 2019-01-03: qty 100

## 2019-01-03 MED ORDER — IPRATROPIUM-ALBUTEROL 0.5-2.5 (3) MG/3ML IN SOLN: 3.0000 mL | RESPIRATORY_TRACT | Status: DC | PRN

## 2019-01-03 MED ORDER — MORPHINE SULFATE (PF) 2 MG/ML IV SOLN: 2.0000 mg | INTRAVENOUS | Status: DC | PRN

## 2019-01-03 MED ORDER — POLYVINYL ALCOHOL 1.4 % OP SOLN
1.0000 [drp] | Freq: Four times a day (QID) | OPHTHALMIC | Status: DC | PRN
  Filled 2019-01-03: qty 15

## 2019-01-05 ENCOUNTER — Ambulatory Visit: Payer: Self-pay | Admitting: Licensed Clinical Social Worker

## 2019-01-05 ENCOUNTER — Other Ambulatory Visit: Payer: Self-pay | Admitting: *Deleted

## 2019-01-05 ENCOUNTER — Other Ambulatory Visit: Payer: Self-pay

## 2019-01-05 NOTE — Patient Outreach (Signed)
[facility]) Care Management  01/05/2019  Mary Raymond May 24, 1951 [id number]   Case closure   [facility] RN CM notified of [name]'s passing via ADT and [facility] hospital liaison Per RN notes Pt passed 01/15/19 at [place] Case closure consumer deceased  Routed to MD  [place]. [name], RN, BSN, [place] Coordinator Office number [phone] Mobile number [phone]  Main [facility] number [phone] Fax number [phone]

## 2019-01-05 NOTE — Chronic Care Management (AMB) (Signed)
  Chronic Care Management   Social Work Note  01/05/2019 Name: Mary Raymond MRN: [id number] DOB: 1951/01/25  Mary Raymond is a 68 y.o. year old female who has seen Mary Balloon, FNP for primary care. The CCM team was consulted for assistance with psychosocial assessment for client.   Mary Brood, RN and Mary Raymond, notified LCSW on 01/05/19 that Mary Raymond expired on 01-05-19.  LCSW has removed name of LCSW from client care plan list.  LCSW will also notify RN CM Mary Raymond that client expired on Jan 05, 2019.  Follow Up Plan: Client case to be closed to CCM program services due to death of client on 2019-01-05.  Mary Raymond.Mary Raymond MSW, LCSW Licensed Clinical Social Worker [facility] Care Management [phone]

## 2019-01-05 NOTE — Progress Notes (Signed)
Notified [facility] Team member and ECM of status.  Natividad Brood, RN BSN [facility] Liaison  [phone] business mobile phone Toll free office [phone]

## 2019-01-06 ENCOUNTER — Ambulatory Visit: Payer: Medicare HMO | Admitting: Family

## 2019-01-16 ENCOUNTER — Other Ambulatory Visit: Payer: Self-pay | Admitting: Family

## 2019-01-16 DIAGNOSIS — R609 Edema, unspecified: Secondary | ICD-10-CM

## 2019-01-16 DIAGNOSIS — I509 Heart failure, unspecified: Secondary | ICD-10-CM

## 2019-01-16 NOTE — Progress Notes (Signed)
No blood pressure, no breathing, only permanent pacer spikes on EKG rhythm. Pronounced death at 12 by myself and Warden Fillers, RN. Dr. Jimmy Footman notified. Family at bedside.  Morphine 54ml flushed down sink wasted by myself and Rexanne Mano, RN

## 2019-01-16 NOTE — Progress Notes (Signed)
   Plans noted for terminal extubation. Given MSOF and new strokes on CT agree with this plan.   We will sign off.   Glori Bickers, MD  8:25 AM

## 2019-01-16 NOTE — Procedures (Signed)
Extubation Procedure Note  Patient Details:   Name: Mary Raymond DOB: [birth year] MRN: [id number]   Airway Documentation:    Vent end date: 01-25-19 Vent end time: 1625   Evaluation  O2 sats: transiently fell during during procedure Complications: No apparent complications Patient did not tolerate procedure well. Bilateral Breath Sounds: Rhonchi   No   Terminal extubation done at this time. Family at bedside  Saunders Glance 01/25/2019, 4:26 PM

## 2019-01-16 NOTE — Progress Notes (Signed)
PROGRESS NOTE    Mary Raymond  MRN:[id number] DOB: 03/21/1951 DOA: 01/06/2019 PCP: Hawks, Christy A, FNP  Outpatient Specialists:   Brief Narrative:  Mary Raymond is a 67 y.o. female with medical history significant of anxiety, osteoarthritis, asthma, chronic back pain, coronary artery disease, diabetic peripheral neuropathy, type 2 diabetes, fibromyalgia, GERD, hyperlipidemia, hypertension, hypothyroidism, migraine headaches, CAD, history of MI, obesity, history of pneumonia at least twice, occipital CVA, type 2 diabetes and vitamin D deficiency.  Patient was admitted following the V. fib arrest in the ER.  Emergent cardiac catheterization revealed restenosis of RCA stent with severe coronary artery disease not amenable to revascularization.  Patient underwent hypothermia protocol and developed seizure-like activity following rewarming.  CT scan of the head revealed extensive infarct.  Goal of care has been discussed repeatedly.  As per collateral information, withdrawal of care may take place today around 3 PM.  Assessment & Plan:   Principal Problem:   Cardiac arrest (HCC) Active Problems:   ST elevation myocardial infarction (STEMI) (HCC)   Coronary stent thrombosis   Acute ST elevation myocardial infarction (STEMI) of inferior wall (HCC)   VF (ventricular fibrillation) (HCC)   Ventricular fibrillation (HCC)   Acute on chronic congestive heart failure (HCC)   Endotracheally intubated   HLD (hyperlipidemia)   Acute respiratory failure with hypoxia (HCC)   Ventilator associated pneumonia (HCC)   Aspiration pneumonia (HCC)  Acute encephalopathy/anoxic versus hypoxic brain injury: Likely multifactorial. Recent V. fib arrest Patient may have hypoxic/anoxic brain injury, new infarcts and multiorgan failure. As per collateral information, for terminal extubation and comfort directed care later today.  AKI: CRRT discontinued.  Acute coronary syndrome/RCA restenosis: Complex  coronary artery disease not amenable to intervention  Respiratory failure: Remains intubated. For terminal extubation today. Comfort directed measures.  DVT prophylaxis: Subcu heparin Code Status: As per collateral information, for terminal extubation Family Communication:  Disposition Plan: Comfort directed measures   Consultants:   Critical care  Nephrology  Cardiology (Dr. Bensimhon)   Procedures:   Cardiac heart  Intubation  Antimicrobials:   IV Unasyn   Subjective: No history from patient.  Objective: Vitals:   12/22/2018 0900 12/23/2018 1000 01/08/2019 1119 12/20/2018 1120  BP: (!) 128/44 (!) 115/44  (!) 131/48  Pulse: (!) 59 60    Resp: 20 18    Temp:   99.5 F (37.5 C)   TempSrc:   Oral   SpO2: 98% 98%    Weight:      Height:        Intake/Output Summary (Last 24 hours) at 12/23/2018 1224 Last data filed at 01/09/2019 1000 Gross per 24 hour  Intake 371.63 ml  Output 1207 ml  Net -835.37 ml   Filed Weights   12/31/18 0500 01/01/19 0500 01/02/19 0449  Weight: 127.3 kg 126 kg 124.2 kg    Examination:  General exam: Intubated.  Not in any distress. Respiratory system: Decreased air entry.   Cardiovascular system: S1 & S2 heard Gastrointestinal system: Abdomen is obese, soft and nontender.  Central nervous system: Hypoxic/anoxic brain injury following V. fib arrest Extremities: Fullness of the ankle.  Data Reviewed: I have personally reviewed following labs and imaging studies  CBC: Recent Labs  Lab 12/30/18 0516 12/31/18 0324 12/31/18 1400 01/01/19 0317 01/01/19 1157  WBC 17.0* 19.4* 16.8* 12.2* 14.2*  HGB 7.0* 6.8* 7.6* 7.2* 7.5*  HCT 22.4* 22.0* 24.2* 23.1* 24.7*  MCV 81.5 82.4 81.5 82.5 82.6  PLT 229 229 180 170 184     Basic Metabolic Panel: Recent Labs  Lab 12/29/18 0410 12/30/18 0409  12/31/18 0324 12/31/18 1557 01/01/19 0317 01/01/19 1600 01/02/19 0436  NA 138 140   < > 139 138 136 138 140  K 4.9 4.7   < > 4.8 4.4 4.4 4.2  3.8  CL 105 104   < > 101 102 102 103 101  CO2 16* 19*   < > 21* 24 22 26 29  GLUCOSE 162* 140*   < > 155* 246* 261* 165* 198*  BUN 69* 87*   < > 77* 57* 55* 38* 39*  CREATININE 3.78* 7.70*   < > 3.27* 2.26* 2.23* 1.48* 1.54*  CALCIUM 7.6* 7.6*   < > 8.0* 8.0* 7.9* 8.2* 8.2*  MG 1.8 2.1  --  2.4  --  2.4  --  2.4  PHOS 8.6* 8.3*   < > 5.4* 4.2 4.1 2.9 2.2*   < > = values in this interval not displayed.   GFR: Estimated Creatinine Clearance: 47 mL/min (A) (by C-G formula based on SCr of 1.54 mg/dL (H)). Liver Function Tests: Recent Labs  Lab 12/31/18 0324 12/31/18 1557 01/01/19 0317 01/01/19 1600 01/02/19 0436  ALBUMIN 1.9* 1.9* 1.8* 1.8* 1.6*   No results for input(s): LIPASE, AMYLASE in the last 168 hours. No results for input(s): AMMONIA in the last 168 hours. Coagulation Profile: No results for input(s): INR, PROTIME in the last 168 hours. Cardiac Enzymes: Recent Labs  Lab 12/31/18 1249  CKTOTAL 360*   BNP (last 3 results) No results for input(s): PROBNP in the last 8760 hours. HbA1C: No results for input(s): HGBA1C in the last 72 hours. CBG: Recent Labs  Lab 01/02/19 1944 01/13/2019 0010 12/19/2018 0354 01/02/2019 0739 12/20/2018 1120  GLUCAP 141* 143* 153* 163* 161*   Lipid Profile: Recent Labs    01/01/19 0317  CHOL 63  HDL 17*  LDLCALC 13  TRIG 163*  CHOLHDL 3.7   Thyroid Function Tests: No results for input(s): TSH, T4TOTAL, FREET4, T3FREE, THYROIDAB in the last 72 hours. Anemia Panel: No results for input(s): VITAMINB12, FOLATE, FERRITIN, TIBC, IRON, RETICCTPCT in the last 72 hours. Urine analysis:    Component Value Date/Time   COLORURINE AMBER (A) 12/30/2018 1212   APPEARANCEUR TURBID (A) 12/30/2018 1212   APPEARANCEUR Clear 02/25/2018 1050   LABSPEC 1.024 12/30/2018 1212   PHURINE 6.0 12/30/2018 1212   GLUCOSEU NEGATIVE 12/30/2018 1212   HGBUR MODERATE (A) 12/30/2018 1212   BILIRUBINUR NEGATIVE 12/30/2018 1212   BILIRUBINUR Negative  02/25/2018 1050   KETONESUR NEGATIVE 12/30/2018 1212   PROTEINUR 100 (A) 12/30/2018 1212   UROBILINOGEN negative 11/14/2015 0949   NITRITE NEGATIVE 12/30/2018 1212   LEUKOCYTESUR MODERATE (A) 12/30/2018 1212   Sepsis Labs: @LABRCNTIP(procalcitonin:4,lacticidven:4)  )No results found for this or any previous visit (from the past 240 hour(s)).       Radiology Studies: Ct Head Wo Contrast  Result Date: 01/02/2019 CLINICAL DATA:  67 y/o F; ongoing ventilator support. Intracranial infarction. EXAM: CT HEAD WITHOUT CONTRAST TECHNIQUE: Contiguous axial images were obtained from the base of the skull through the vertex without intravenous contrast. COMPARISON:  12/30/2018 CT head FINDINGS: Brain: Interval development of a small cortical infarction within the right posterior inferior temporal lobe. Stable distribution of small infarctions within the right occipital lobe as well as bilateral thalami which extend into the midbrain. No hemorrhage, mass effect, extra-axial collection, hydrocephalus, or herniation. Vascular: Calcific atherosclerosis of the carotid siphons and vertebral arteries. No hyperdense vessel identified.   Skull: Normal. Negative for fracture or focal lesion. Sinuses/Orbits: Moderate diffuse paranasal sinus mucosal thickening and partial opacification of the mastoid air cells. Orbits are unremarkable. Other: None. IMPRESSION: 1. Interval development of a small late acute/subacute cortical infarction within the right posterior inferior temporal lobe. 2. Stable distribution of small infarctions within the right occipital lobe, bilateral thalami, and midbrain. 3. No hemorrhage, mass effect, or herniation. Electronically Signed   By: Lance  Furusawa-Stratton M.D.   On: 01/02/2019 02:58   Dg Chest Port 1 View  Result Date: 01/02/2019 CLINICAL DATA:  Hypoxia. EXAM: PORTABLE CHEST 1 VIEW COMPARISON:  One-view chest x-ray 01/01/2019 FINDINGS: The heart is enlarged. Endotracheal tube terminates  4.5 cm above the carina, in satisfactory position. A right IJ sheath remains. Atherosclerotic calcifications are present at the aortic arch. Mild pulmonary vascular congestion is stable. Persistent bibasilar airspace opacities are noted. Lung volumes are slightly decreased. IMPRESSION: 1. Slight decrease in lung volumes with persistent bibasilar airspace opacities, likely atelectasis. 2. Support apparatus is stable. 3. Stable cardiomegaly and moderate pulmonary vascular congestion. Electronically Signed   By: Christopher  Mattern M.D.   On: 01/02/2019 08:22        Scheduled Meds: . sodium chloride   Intravenous Once  . aspirin  81 mg Oral Daily  . carvedilol  3.125 mg Per Tube BID WC  . chlorhexidine gluconate (MEDLINE KIT)  15 mL Mouth Rinse BID  . Chlorhexidine Gluconate Cloth  6 each Topical Daily  . fentaNYL (SUBLIMAZE) injection  50 mcg Intravenous Once  . heparin  5,000 Units Subcutaneous Q8H  . insulin aspart  0-15 Units Subcutaneous Q4H  . insulin detemir  10 Units Subcutaneous Daily  . ipratropium-albuterol  3 mL Nebulization Q6H  . levETIRAcetam  500 mg Oral BID  . levothyroxine  50 mcg Per Tube Q0600  . mouth rinse  15 mL Mouth Rinse 10 times per day  . pantoprazole sodium  40 mg Per Tube Daily  . rosuvastatin  10 mg Per Tube q1800  . sodium chloride flush  10-40 mL Intracatheter Q12H  . ticagrelor  90 mg Oral BID   Continuous Infusions: . sodium chloride Stopped (01/02/19 1210)  . sodium chloride    . sodium chloride    . sodium chloride 10 mL/hr at 01/01/2019 0654  . ampicillin-sulbactam (UNASYN) IV Stopped (12/24/2018 0401)  . fentaNYL infusion INTRAVENOUS Stopped (12/29/18 1322)  . norepinephrine (LEVOPHED) Adult infusion Stopped (12/24/2018 0331)  . prismasol BGK 4/2.5 2,000 mL/hr at 01/02/19 1346  . propofol (DIPRIVAN) infusion Stopped (12/27/18 1738)     LOS: 9 days    Time spent: 65 minutes.     , MD  [facility] Pager #: [phone]  7PM-7AM contact night coverage as above    

## 2019-01-16 NOTE — Progress Notes (Signed)
68 year old woman admitted 4/9 with V. fib arrest and inferior wall STEMI. Emergent cardiac cath showed restenosis of RCA stent and severe CAD not amenable to revascularization.  She underwent hypothermia protocol and had seizure-like activity after rewarming. Head CT 4/14 showed bilateral thalamic infarcts with extension into the left midbrain and small right occipital infarct.  She was opening eyes to name and squeezing fingers on 4/16 but then developed poor mental status overnight and repeat head CT 4/17 showed new acute right posterior inferior temporal infarct Developed AKI requiring CRRT, which was turned off 4/17  She remains unresponsive, orally intubated, off pressors, does not follow commands, S1-S2 regular, decreased breath sounds bilateral, soft nontender abdomen, diffuse edema.  Chest x-ray from 4/17 personally reviewed which shows bibasal atelectasis/infiltrates  Impression/plan  Acute encephalopathy -bilateral thalamic infarcts suggestive of anoxic damage right temporal infarct may be embolic  AKI -off CRRT now, doubtful prognosis for renal recovery  Severe CAD not amenable to revascularization-continue low-dose beta-blocker, aspirin and Brilinta and statin.  Acute hypoxic respiratory failure-poor mental status precludes extubation Continue Unasyn for aspiration pneumonia Explained to daughter that she would need a tracheostomy to get through this  Goals of care discussed with daughter on 4/17, plan is for withdrawal of life support when family has had a chance to visit today.  The patient is critically ill with multiple organ systems failure and requires high complexity decision making for assessment and support, frequent evaluation and titration of therapies, application of advanced monitoring technologies and extensive interpretation of multiple databases. Critical Care Time devoted to patient care services described in this note independent of APP/resident  time is 31  minutes.   Leanna Sato Elsworth Soho MD

## 2019-01-16 NOTE — Progress Notes (Signed)
Patient ID: Mary Raymond, female   DOB: 06/12/1951, 68 y.o.   MRN: [id number]   CRRT stopped yesterday after increased filter pressures and potential for clotting circuit/blood loss. Plans noted for terminal extubation/comfort measures only today following arrival of family.  Will sign off and remain available for questions.  Elmarie Shiley MD [facility] Office # [phone] Pager # [phone] 7:50 AM

## 2019-01-16 DEATH — deceased

## 2019-02-02 ENCOUNTER — Ambulatory Visit: Payer: Medicare HMO | Admitting: Family

## 2019-02-16 NOTE — Discharge Summary (Signed)
Physician Discharge Summary  Mary Raymond FGB:[id number] DOB: 01/30/1951 DOA: 12/26/2018  [name], FNP  Admit date: 01/11/2019 Discharge date: 08-Jan-2019  Death discharge summary: Mary Messing Mitchellwas a 68 year old female with medical history significant ofanxiety, osteoarthritis, asthma, chronic back pain, coronary artery disease, diabetic peripheral neuropathy, type 2 diabetes, fibromyalgia, GERD, hyperlipidemia, hypertension, hypothyroidism, migraine headaches, CAD, history of MI, obesity, history of pneumonia at least twice, occipital CVA, type 2 diabetes and vitamin D deficiency.  Patient was admitted following V. fib arrest in the ER.  Emergent cardiac catheterization revealed restenosis of RCA stent with severe coronary artery disease not amenable to revascularization.  Patient underwent hypothermia protocol and developed seizure-like activity following rewarming.  CT scan of the head revealed extensive infarct.  Goal of care was discussed repeatedly.    Comfort directed care was eventually proceed.  Terminal extubation was done on 2019-01-08, and patient died shortly afterwards.    For the records, I saw the patient for the first time today, 08-Jan-2019.    Discharge Diagnoses:  Principal Problem:   Cardiac arrest [facility]) Active Problems:   ST elevation myocardial infarction (STEMI) ([place])   Coronary stent thrombosis   Acute ST elevation myocardial infarction (STEMI) of inferior wall (HCC)   VF (ventricular fibrillation) (HCC)   Ventricular fibrillation (HCC)   Acute on chronic congestive heart failure (HCC)   Endotracheally intubated   HLD (hyperlipidemia)   Acute respiratory failure with hypoxia (HCC)   Ventilator associated pneumonia (HCC)   Aspiration pneumonia (HCC)    Signed:  [name], MD  [facility] Pager #: [phone] 7PM-7AM contact night coverage as above

## 2019-02-24 ENCOUNTER — Ambulatory Visit: Payer: Medicare HMO | Admitting: Cardiology

## 2019-06-01 IMAGING — CR DG CHEST 1V PORT
1 series · 1 of 1 positions shown · non-contrast
Comparison: 06/05/2018

CLINICAL DATA: Chest pain and shortness of breath. Similar pain 2
weeks ago with STEMI. History of asthma, diabetes, hypertension,
breast lumpectomy

EXAM:
PORTABLE CHEST 1 VIEW

[ap]
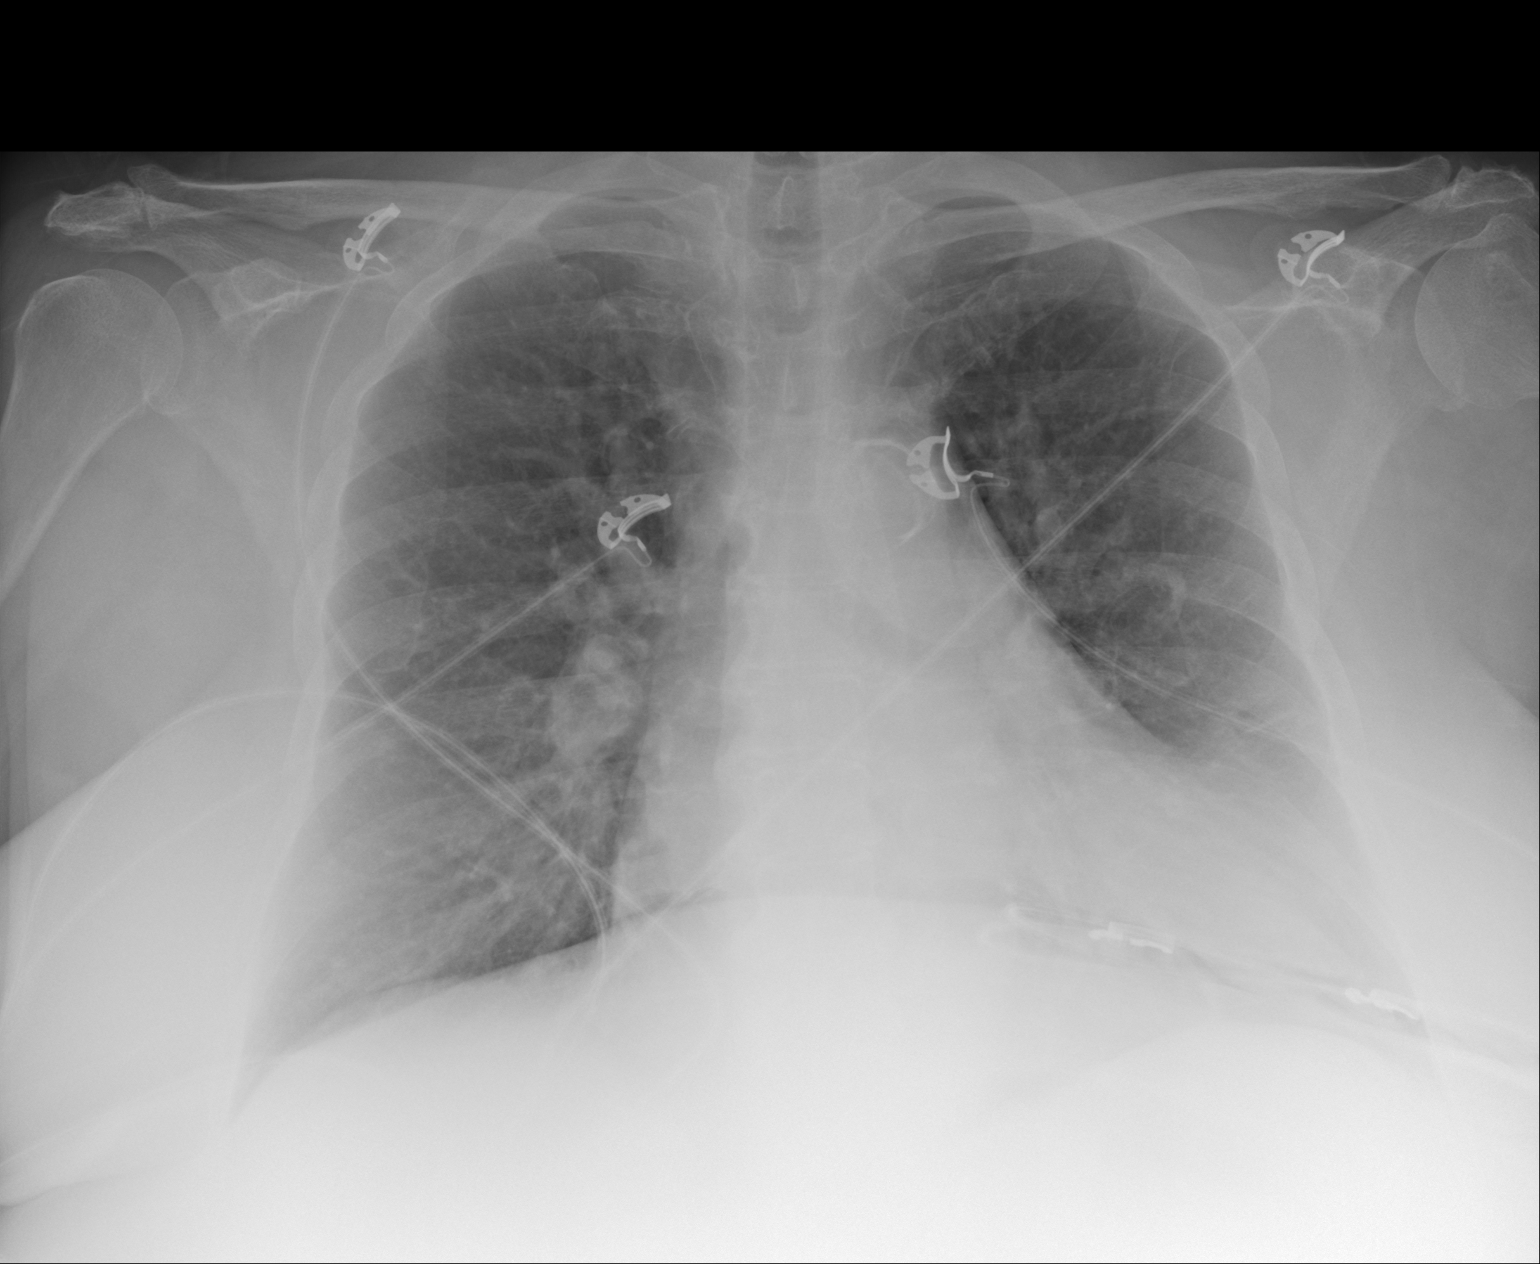

[1 of 1 positions shown; findings below may reference images not displayed]

FINDINGS: Cardiac enlargement. No airspace disease or consolidation in the
lungs. No blunting of costophrenic angles. No pneumothorax.
Mediastinal contours appear intact. Calcification of the aorta.
IMPRESSION: No active disease.

## 2019-10-23 ENCOUNTER — Encounter: Payer: Medicare HMO | Admitting: Internal Medicine

## 2019-11-30 IMAGING — CR PORTABLE CHEST - 1 VIEW
1 series · 1 of 1 positions shown · non-contrast
Comparison: 12/08/2018

CLINICAL DATA: HERE FOR SOB. HX: ASTHMA, DIABETES, HTN, CVA,
PACEMAKER, GERD, MI, HEART MAIER, CAD WITH STENT PLACEMENT.

EXAM:
PORTABLE CHEST 1 VIEW

[pa]
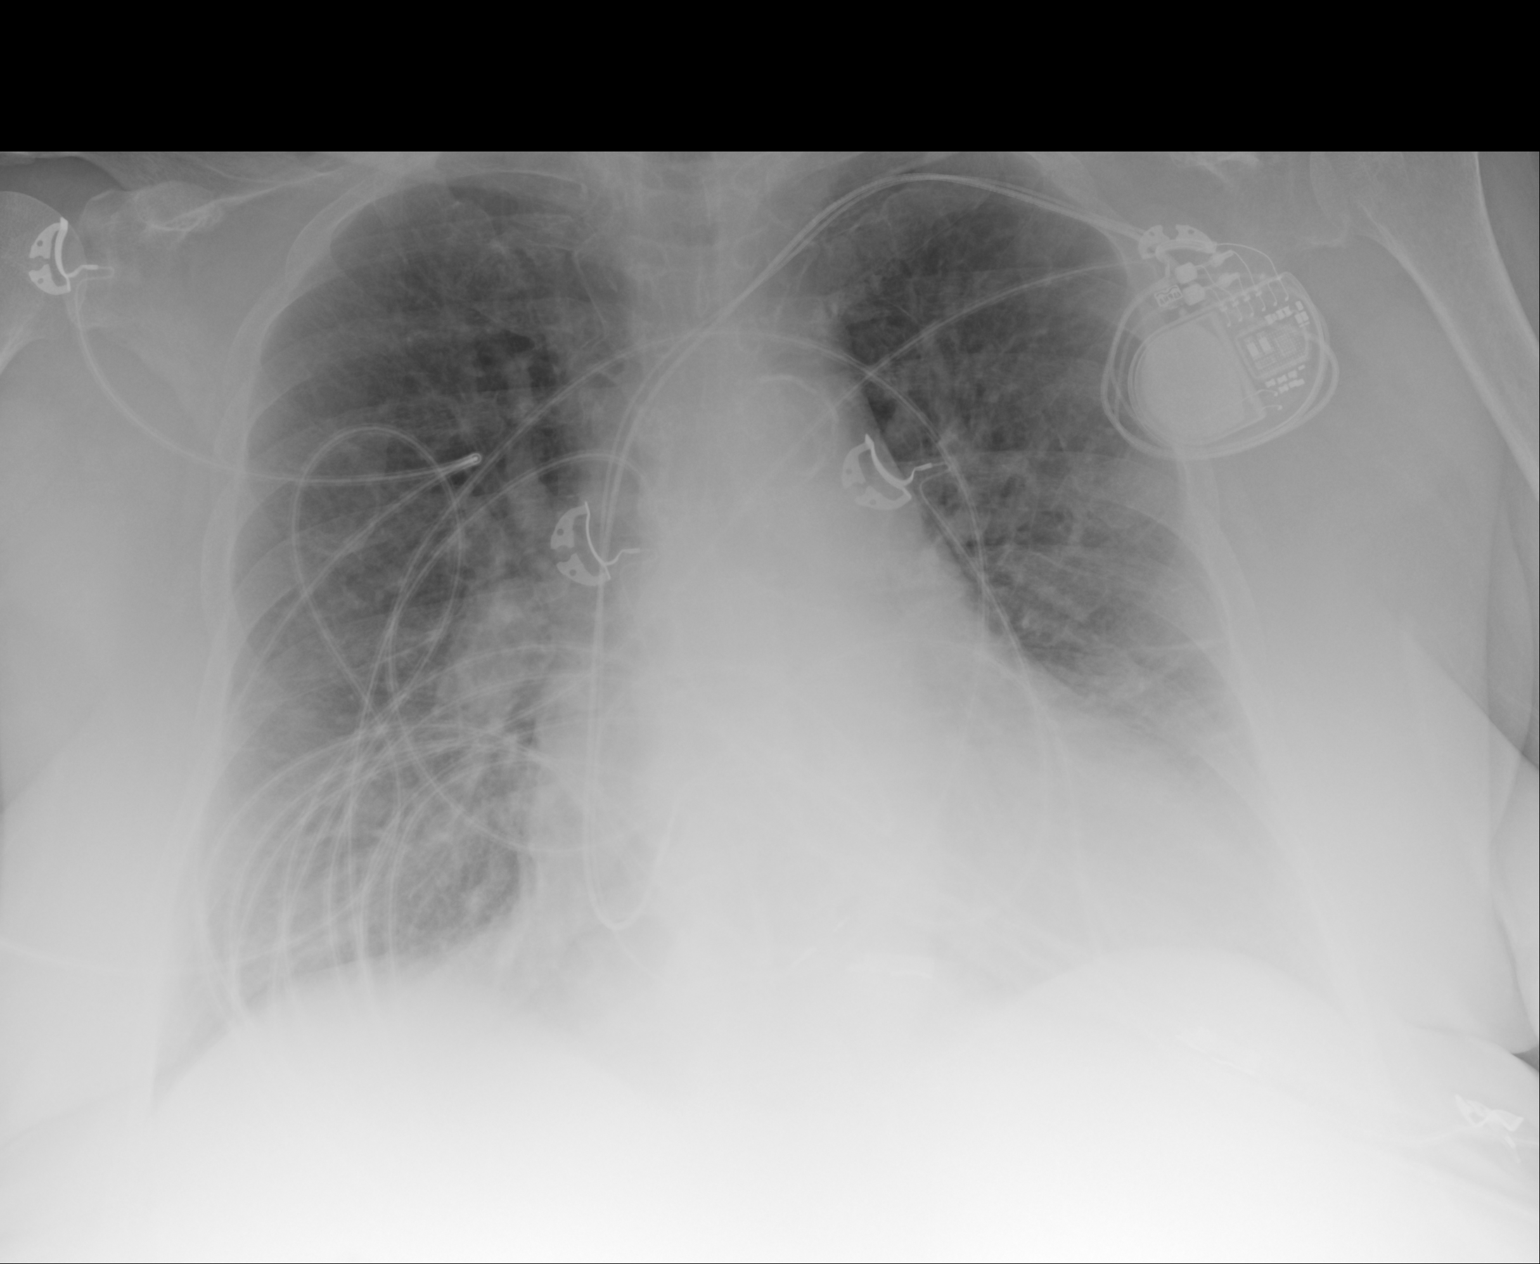

[1 of 1 positions shown; findings below may reference images not displayed]

FINDINGS: Mild to moderate enlargement of the cardiopericardial silhouette
with indistinct pulmonary vasculature and Kerley B lines suggesting
mild interstitial edema.

Dual lead pacer.

Atherosclerotic calcification of the aortic arch.

No overt airspace opacity.
IMPRESSION: 1. Mild to moderate enlargement of the cardiopericardial silhouette
with suspected mild interstitial edema. No overt airspace edema.
2.  Aortic Atherosclerosis ([id number]).

## 2019-12-05 IMAGING — CT CT HEAD WITHOUT CONTRAST
4 series · 16 of 47 positions shown, 18 images · non-contrast
Comparison: 06/17/2018

CLINICAL DATA: Mental status changes. Hypoxic ischemic
encephalopathy.

EXAM:
CT HEAD WITHOUT CONTRAST
TECHNIQUE: Contiguous axial images were obtained from the base of the skull
through the vertex without intravenous contrast.

[Series 3: head wo · axial · 0.42mm/px · z∈[-90,+30]mm · 7 of 33 slices shown, 9 images]
[im 5/33  brain]
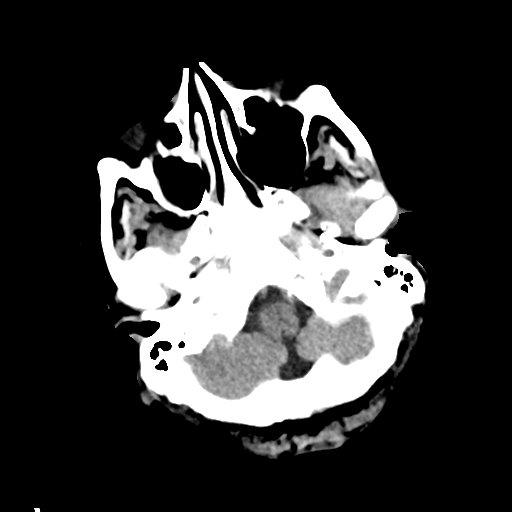
[im 5/33  bone]
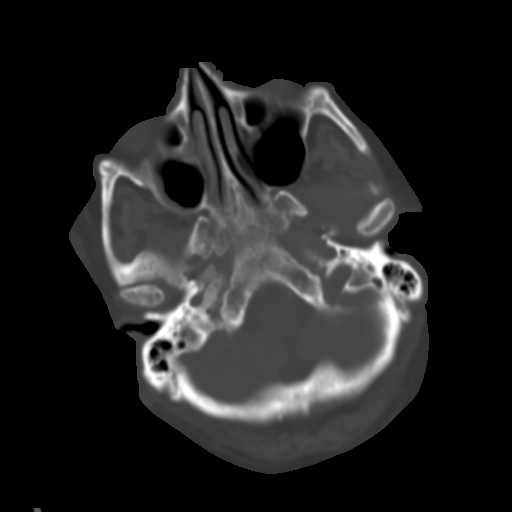
[im 9/33  brain]
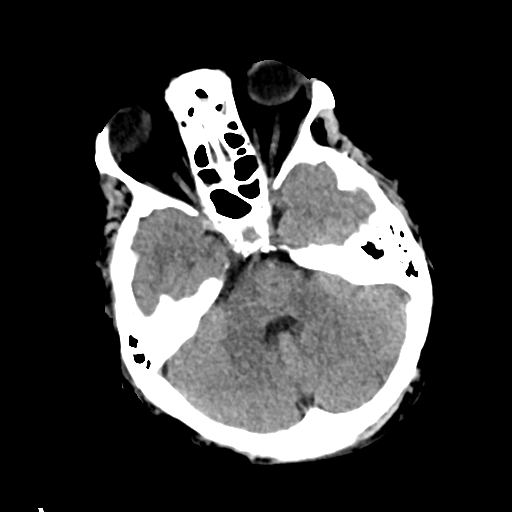
[im 13/33  brain]
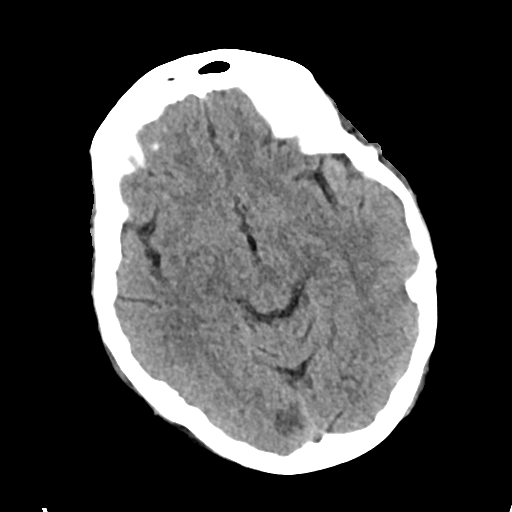
[im 17/33  brain]
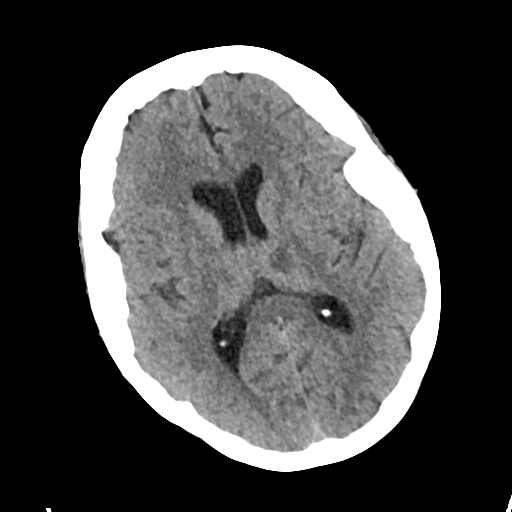
[im 21/33  brain]
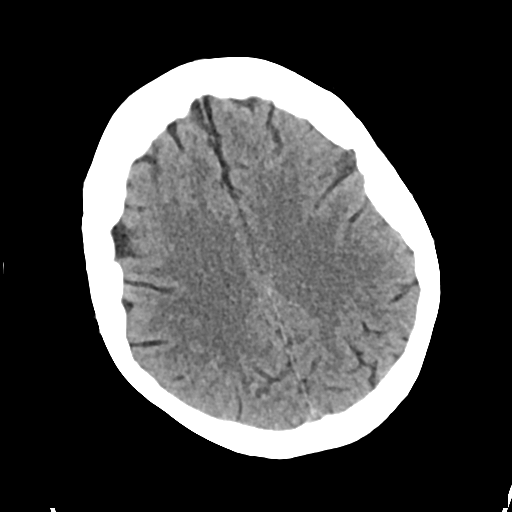
[im 21/33  bone]
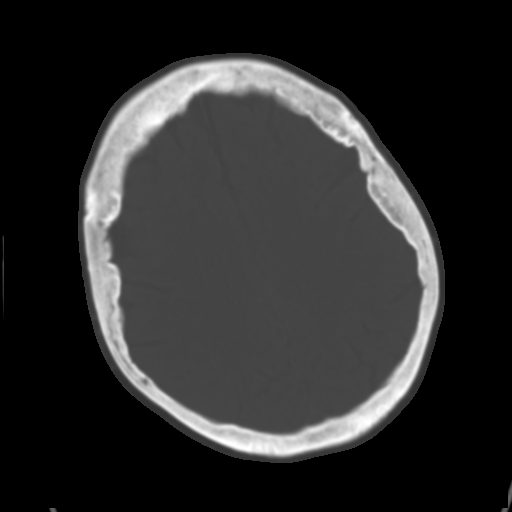
[im 25/33  brain]
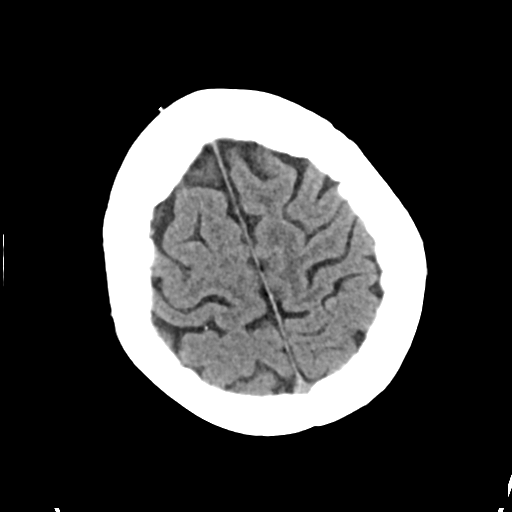
[im 29/33  brain]
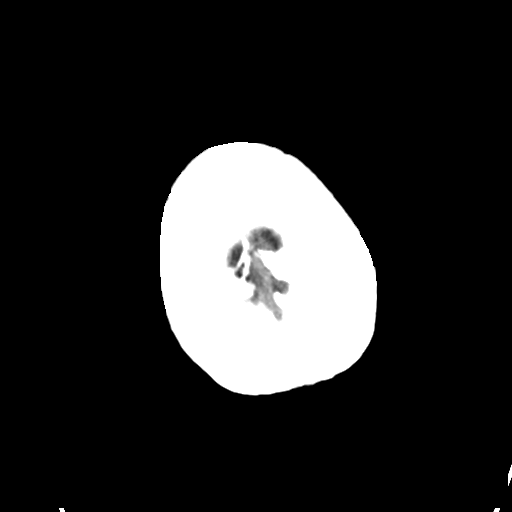

[Series 4: head bone · axial · 0.42mm/px · z∈[-94,-62]mm · 3 of 83 slices shown]
[im 9/83  bone]
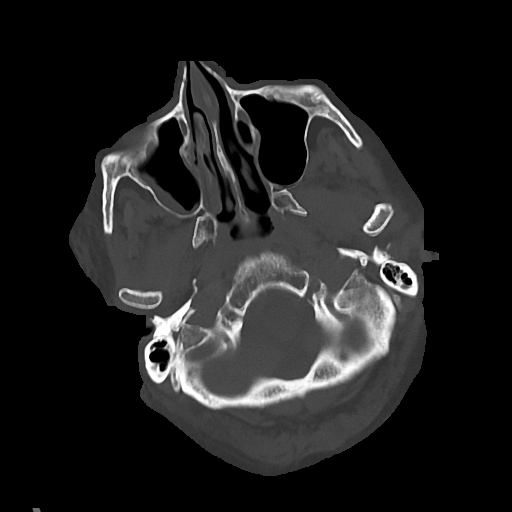
[im 17/83  bone]
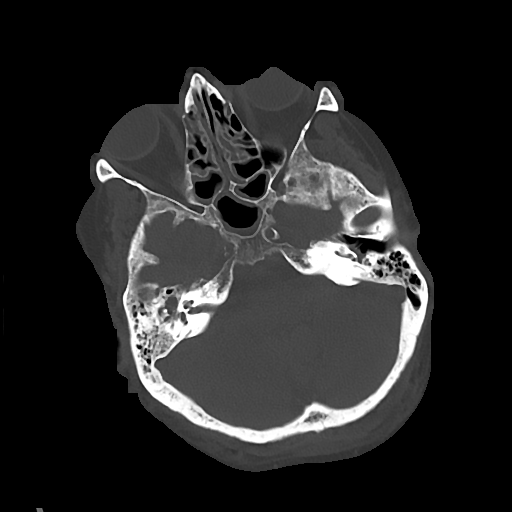
[im 25/83  bone]
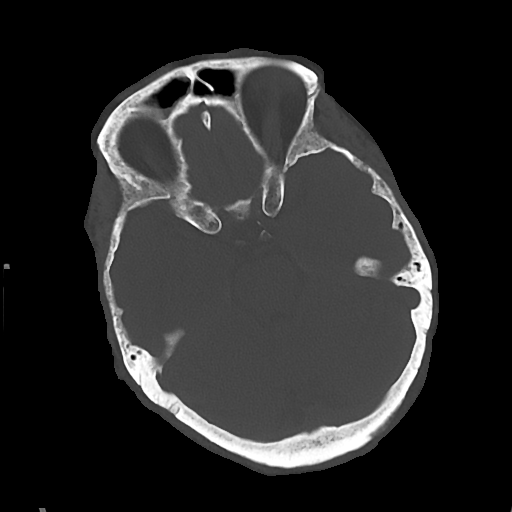

[Series 5: cor soft · coronal · 0.32mm/px · 3 of 67 slices shown]
[im 23/67  brain]
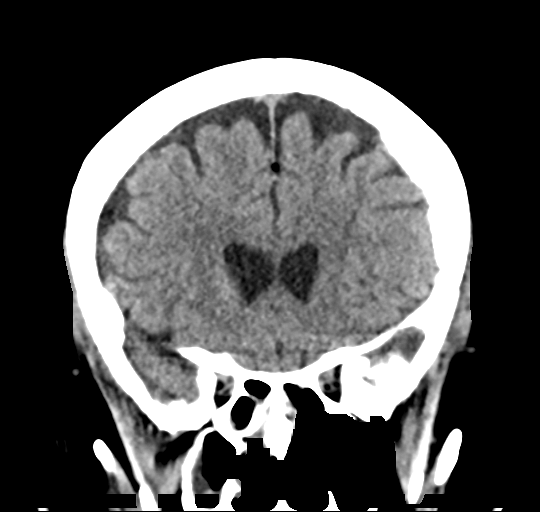
[im 30/67  brain]
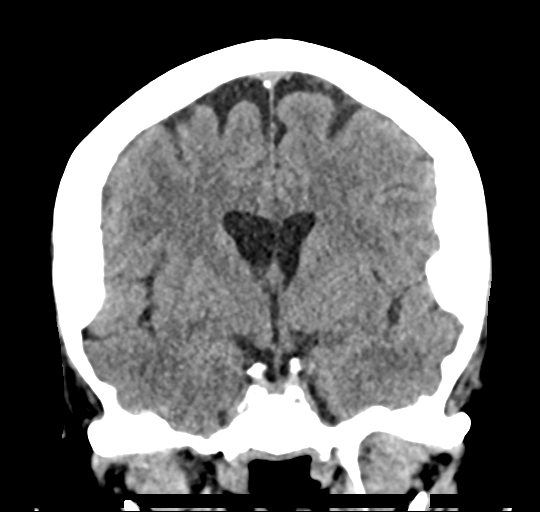
[im 37/67  brain]
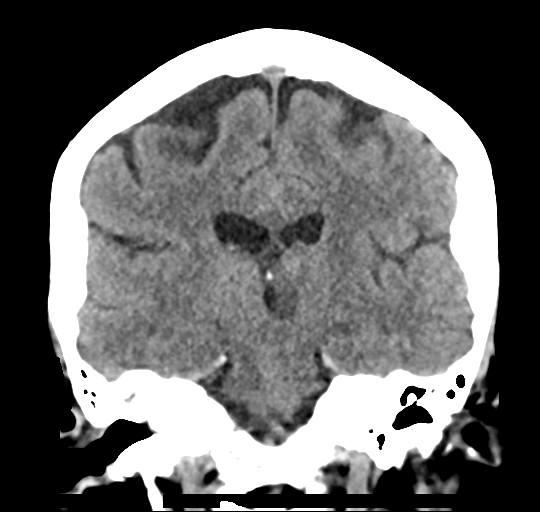

[Series 6: sag soft · sagittal · 0.32mm/px · 3 of 56 slices shown]
[im 19/56  brain]
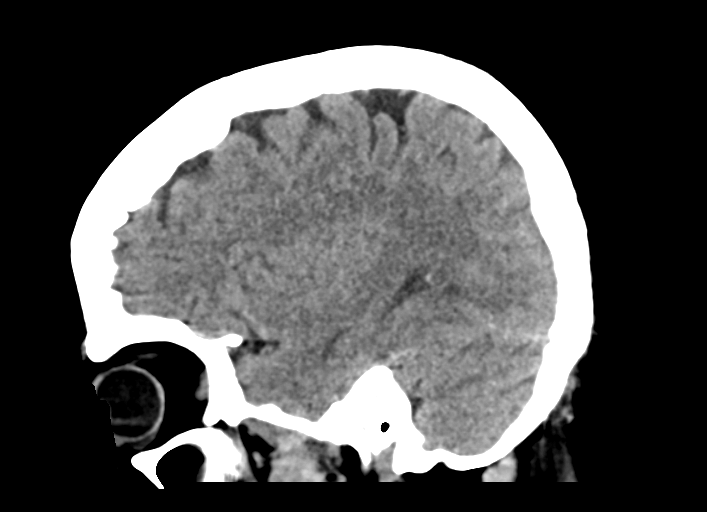
[im 28/56  brain]
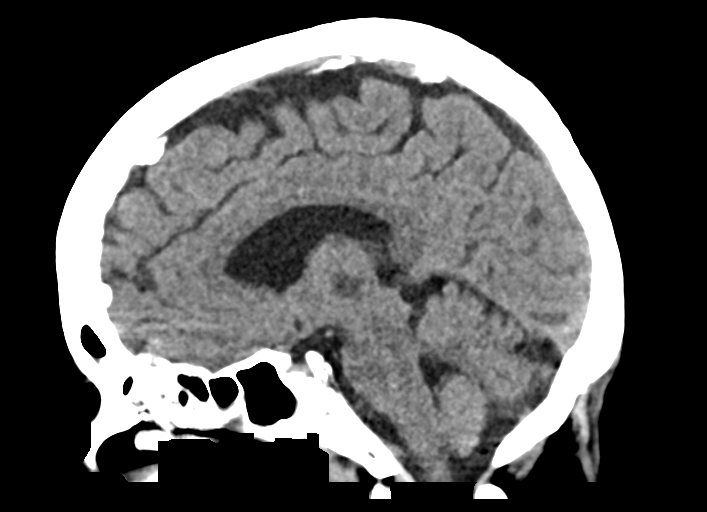
[im 37/56  brain]
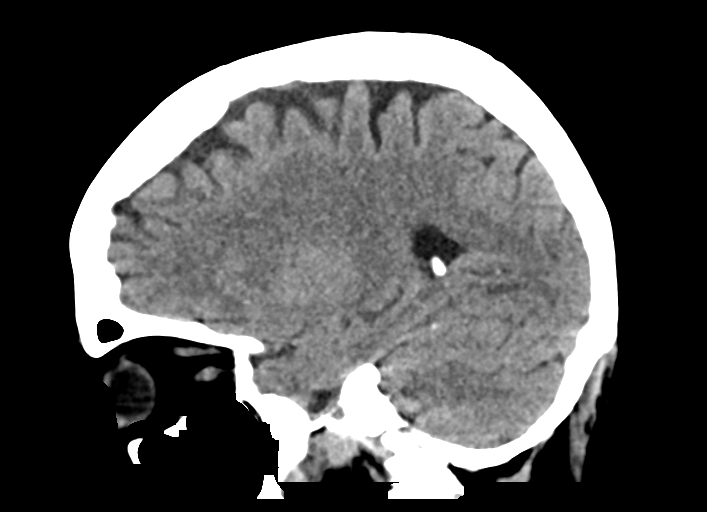

[16 of 47 positions shown; findings below may reference images not displayed]

FINDINGS: Brain: Symmetric low-density affecting the thalami with extension
into the left midbrain consistent with deep brain hypoxic ischemic
infarctions. Probable small acute infarction in the right occipital
lobe. No sign of hemorrhage or swelling. No widespread cortical
insult identified. No hydrocephalus. No extra-axial collection.

Vascular: There is atherosclerotic calcification of the major
vessels at the base of the brain.

Skull: Normal

Sinuses/Orbits: Ordinary mucosal thickening.  Orbits negative.

Other: None
IMPRESSION: Bilateral thalamic infarctions with extension into the left
midbrain. Small right occipital infarction. No hemorrhage or mass
effect.
